# Patient Record
Sex: Female | Born: 1965 | Race: White | Hispanic: No | Marital: Married | Smoking: Former smoker
Health system: Southern US, Community
[De-identification: ages and names within clinical notes are randomized; demographics above are authoritative.]

## PROBLEM LIST (undated history)

## (undated) DIAGNOSIS — F419 Anxiety disorder, unspecified: Secondary | ICD-10-CM

## (undated) DIAGNOSIS — G43909 Migraine, unspecified, not intractable, without status migrainosus: Secondary | ICD-10-CM

## (undated) DIAGNOSIS — C801 Malignant (primary) neoplasm, unspecified: Secondary | ICD-10-CM

## (undated) HISTORY — DX: Anxiety disorder, unspecified: F41.9

## (undated) HISTORY — PX: TONSILLECTOMY: SUR1361

## (undated) HISTORY — PX: ADENOIDECTOMY: SHX5191

## (undated) HISTORY — DX: Malignant (primary) neoplasm, unspecified: C80.1

---

## 2018-01-04 DIAGNOSIS — G43909 Migraine, unspecified, not intractable, without status migrainosus: Secondary | ICD-10-CM | POA: Insufficient documentation

## 2018-02-09 DIAGNOSIS — C801 Malignant (primary) neoplasm, unspecified: Secondary | ICD-10-CM

## 2018-02-09 HISTORY — DX: Malignant (primary) neoplasm, unspecified: C80.1

## 2018-02-10 DIAGNOSIS — C801 Malignant (primary) neoplasm, unspecified: Secondary | ICD-10-CM

## 2018-02-10 HISTORY — DX: Malignant (primary) neoplasm, unspecified: C80.1

## 2018-02-20 DIAGNOSIS — Z720 Tobacco use: Secondary | ICD-10-CM | POA: Insufficient documentation

## 2018-02-20 DIAGNOSIS — F418 Other specified anxiety disorders: Secondary | ICD-10-CM | POA: Diagnosis present

## 2018-02-20 DIAGNOSIS — C20 Malignant neoplasm of rectum: Secondary | ICD-10-CM | POA: Insufficient documentation

## 2018-03-06 DIAGNOSIS — D638 Anemia in other chronic diseases classified elsewhere: Secondary | ICD-10-CM | POA: Insufficient documentation

## 2018-03-06 DIAGNOSIS — K529 Noninfective gastroenteritis and colitis, unspecified: Secondary | ICD-10-CM | POA: Insufficient documentation

## 2018-03-06 DIAGNOSIS — C7800 Secondary malignant neoplasm of unspecified lung: Secondary | ICD-10-CM | POA: Insufficient documentation

## 2018-03-06 DIAGNOSIS — D72829 Elevated white blood cell count, unspecified: Secondary | ICD-10-CM | POA: Insufficient documentation

## 2018-03-15 DIAGNOSIS — D563 Thalassemia minor: Secondary | ICD-10-CM | POA: Insufficient documentation

## 2018-04-25 ENCOUNTER — Other Ambulatory Visit: Payer: Self-pay | Admitting: Oncology

## 2018-04-25 ENCOUNTER — Ambulatory Visit
Admission: RE | Admit: 2018-04-25 | Discharge: 2018-04-25 | Disposition: A | Discharge status: Home / Self Care | Payer: Self-pay | Source: Ambulatory Visit | Attending: Oncology | Admitting: Oncology

## 2018-04-25 ENCOUNTER — Inpatient Hospital Stay
Admission: RE | Admit: 2018-04-25 | Discharge: 2018-04-25 | Disposition: A | Discharge status: Home / Self Care | Payer: Self-pay | Source: Ambulatory Visit | Attending: Oncology | Admitting: Oncology

## 2018-04-25 DIAGNOSIS — C801 Malignant (primary) neoplasm, unspecified: Secondary | ICD-10-CM

## 2018-04-25 NOTE — Progress Notes (Signed)
Called and spoke with Levada Dy RN at Dr. Milus Glazier office at [facility] to request documentation on patient's chemo treatment plan. Patient is transferring her care after having started chemo treatments at [facility].  They will call patient to obtain release of information and then fax documentation.

## 2018-04-27 ENCOUNTER — Telehealth: Payer: Self-pay | Admitting: Oncology

## 2018-04-27 ENCOUNTER — Encounter: Payer: Self-pay | Admitting: Oncology

## 2018-04-27 ENCOUNTER — Inpatient Hospital Stay: Payer: 59 | Attending: Oncology | Admitting: Oncology

## 2018-04-27 VITALS — BP 95/69 | HR 82 | Temp 98.5°F | Resp 18 | Ht 67.0 in | Wt 109.6 lb

## 2018-04-27 DIAGNOSIS — C20 Malignant neoplasm of rectum: Secondary | ICD-10-CM | POA: Diagnosis not present

## 2018-04-27 DIAGNOSIS — Z5111 Encounter for antineoplastic chemotherapy: Secondary | ICD-10-CM | POA: Diagnosis not present

## 2018-04-27 NOTE — Progress Notes (Signed)
Met with Tina Morales and Tina Morales. Patient is transferring care from [facility]. Explained role of nurse navigator.   Referral made to dietician for diet education and to social work.Tina Morales names and phone numbers were provided for entire [facility] team.  Teach back method was used.  Patient and Tina Morales given a tour of the infusion room.No barriers to care identified at present time.  Will continue to follow as needed

## 2018-04-27 NOTE — Progress Notes (Signed)
START OFF PATHWAY REGIMEN - Colorectal   [id number]:FOLFOXIRI q14 days **2 cycles per order sheet**:   A cycle is every 14 days:     Irinotecan      Oxaliplatin      Leucovorin      5-Fluorouracil   **Always confirm dose/schedule in your pharmacy ordering system**  Patient Characteristics: Preoperative or Nonsurgical Candidate (Clinical Staging), Rectal, cT3 - cT4, cN0 or Any cT, cN+ Therapeutic Status: Preoperative or Nonsurgical Candidate (Clinical Staging) Tumor Location: Rectal AJCC T Category: Staged < 8th Ed. AJCC N Category: Staged < 8th Ed. AJCC M Category: Staged < 8th Ed. AJCC 8 Stage Grouping: Staged < 8th Ed. Intent of Therapy: Curative Intent, Discussed with Patient

## 2018-04-27 NOTE — Progress Notes (Signed)
[place] Patient Consult   Requesting MD: Numa Heatwole 52 y.o.  02-12-1966    Reason for Consult: Rectal cancer   HPI: Tina Morales reports irregular bowel habits with intermittent diarrhea for several years.  The diarrhea progressed earlier this year and became associated with pain at the rectum and left buttock. She scheduled a colonoscopy with Dr. Shary Key on 02/10/2018.  A 3.5 cm mass with stigmata of bleeding was noted in the rectum.  The remaining colon appeared normal.  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  There was no loss of mismatch repair protein expression.  CTs on 02/10/2018 revealed eccentric anterior rectal wall thickening, several pulmonary nodules measuring up to 9 mm concerning for metastatic disease.  Changes of emphysema were noted.  Round perirectal lymph nodes measuring up to 6 mm are noted. A pelvic MRI 06/2018 revealed a mass at 2.3 cm from the anal verge measuring 5.4 cm in length.  The tumor was staged as a T4b lesion based on extension to the posterior aspect of the vagina.  2 lymph nodes in the mesorectal fat measured up to 5 mm with additional smaller nodes.  The tumor was staged as an N1 lesion.  A staging PET scan 02/23/2018 healed a hypermetabolic 8 mm lingular nodule.  Smaller nodules were noted in both lungs, a few with mildly increased activity.  Hypermetabolic rectal mass. She was referred to Dr. Marylou Mccoy in medical oncology.  She began treatment with FOLFOX [place] on 03/01/2018.  She has completed 5 cycles to date with the most recent cycle initiated on 04/26/2018.  She was admitted following cycle 1 with a possible bowel obstruction.  She reports symptoms improved after treatment with laxatives.  Her bowels are now functioning regularly while taking MiraLAX and fiber.  She is scheduled for a restaging CT evaluation 05/01/2018.  [name] has decided to transfer care of rectal cancer to [facility].  Medical history: 1.  G0, P0 2.  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  A paternal uncle had lung cancer and was a smoker.  A paternal cousin had CML.  Social History: She lives with her husband and [place].  She previously worked in the [employer].  She quit smoking cigarettes in August 2019 after smoking for 30 years.  No alcohol use.  No transfusion history.  No risk factor for HIV or hepatitis.    ROS:   Positives include: Intermittent low-grade fever, rectal pain-improved following the first treatment with FOLFOX [place], few episodes of rectal bleeding, stinging discomfort in the left axilla after cycle 1 chemotherapy, insomnia, mouth sores following chemotherapy, cold sensitivity following chemotherapy, tingling in the fingers following chemotherapy-not persistent  A complete ROS was otherwise negative.  Physical Exam:  Blood pressure 95/69, pulse 82, temperature 98.5 F (36.9 C), temperature source Oral, resp. rate 18, height 5' 7"  (1.702 m), weight 109 lb 9.6 oz (49.7 kg), SpO2 100 %.  HEENT: Oropharynx without visible mass, neck without mass, no thrush or ulcers Lungs: Clear bilaterally, no respiratory distress Cardiac: Regular rate and rhythm Abdomen: No hepatosplenomegaly, no mass, nontender  Vascular: No leg edema Lymph nodes: No cervical, supraclavicular, axillary, or inguinal nodes Neurologic:  Alert and oriented, the motor exam appears intact in the upper and lower extremities bilaterally Skin: No rash, palms without erythema Musculoskeletal: No spine tenderness Breast: Bilateral breast without mass   LAB: 04/26/2018- ANC 1.69, hematoma 9.6, platelets 237,000  CEA on 02/14/2018:  39.7  Images: I reviewed the PET from 02/23/2018 and CTs from 02/10/2018 with Tina Morales and her husband Assessment/Plan:   1. Rectal cancer-rectal mass noted on digital exam 02/10/2018, colonoscopy confirmed a him my circumferential mass in the rectum  Biopsy 02/10/2018- tubular adenoma with at least high-grade dysplasia but no definitive evidence of invasion, pathology review at digestive health specialist- intramucosal adenocarcinoma (at least), arising in high-grade dysplasia, no loss of mismatch repair protein expression  CTs 02/10/2018- anterior rectal wall thickening, pulmonary nodules measuring up to 9 mm concerning for metastases, few round perirectal lymph nodes  Pelvic MRI 9/64/3838- hypermetabolic low rectal mass extending to the posterior vagina with 2 small enlarged perirectal lymph nodes, T4b,N1- 2.3 cm from the anal verge  PET scan 02/23/2018-hypermetabolic rectal mass, 8 mm hypermetabolic lingular nodule, scattered small bilateral lung nodules, some calcified, a few with mildly increased activity  Cycle 1 FOLFOXIRI 03/01/2018  Cycle 5 FOLFOXIRI 04/26/2018  2.   History of diarrhea and rectal pain secondary to #1 3.   History of tobacco use   Disposition:   Tina Morales has been diagnosed with rectal cancer.  She has a locally advanced rectal tumor and appears to have metastatic disease involving the lungs.  She has completed 5 cycles of treatment with FOLFOXIRI and is scheduled to undergo a restaging CT evaluation next week.  She appears to be tolerating the chemotherapy well.  Her symptoms have improved and the CEA is lower.  I discussed the prognosis of rectal cancer and treatment options with Tina Morales and her husband.  She understands that in general patients with stage IV disease cannot undergo curative therapy.  However if she has a limited number of metastatic lung nodule she may be a candidate for resection or SBRT therapy.  She will bring [place] a copy of the CTs from next week.  I  will present her case at the GI tumor conference on 05/03/2018.  We will make a referral to a coloanal surgeon.  She will be scheduled for an office visit and another cycle of chemotherapy on 05/11/2018.  We will consider treatment options including continuing systemic therapy, capecitabine/radiation, and surgery based on the staging CTs.  A chemotherapy plan was entered today.  Betsy Coder, MD  04/27/2018, 2:09 PM

## 2018-04-27 NOTE — Telephone Encounter (Signed)
Scheduled appt per 10/17 los - gave patient calender and AVS per los

## 2018-05-02 NOTE — Progress Notes (Signed)
CD/ Inage from [facility] received and taken to Margit Hanks in Radiology to be uploaded into PACS.

## 2018-05-03 ENCOUNTER — Inpatient Hospital Stay
Admission: RE | Admit: 2018-05-03 | Discharge: 2018-05-03 | Disposition: A | Discharge status: Home / Self Care | Payer: Self-pay | Source: Ambulatory Visit | Attending: Oncology | Admitting: Oncology

## 2018-05-03 ENCOUNTER — Other Ambulatory Visit: Payer: Self-pay | Admitting: Oncology

## 2018-05-03 DIAGNOSIS — C801 Malignant (primary) neoplasm, unspecified: Secondary | ICD-10-CM

## 2018-05-06 ENCOUNTER — Other Ambulatory Visit: Payer: Self-pay | Admitting: Oncology

## 2018-05-07 ENCOUNTER — Other Ambulatory Visit: Payer: Self-pay | Admitting: Oncology

## 2018-05-11 ENCOUNTER — Encounter: Payer: Self-pay | Admitting: Oncology

## 2018-05-11 ENCOUNTER — Inpatient Hospital Stay: Payer: 59

## 2018-05-11 ENCOUNTER — Inpatient Hospital Stay (HOSPITAL_BASED_OUTPATIENT_CLINIC_OR_DEPARTMENT_OTHER): Payer: 59 | Admitting: Nurse Practitioner

## 2018-05-11 ENCOUNTER — Inpatient Hospital Stay: Payer: 59 | Admitting: Nutrition

## 2018-05-11 ENCOUNTER — Encounter: Payer: Self-pay | Admitting: Nurse Practitioner

## 2018-05-11 VITALS — BP 110/84 | HR 67 | Temp 98.7°F | Resp 17 | Ht 67.0 in | Wt 112.0 lb

## 2018-05-11 DIAGNOSIS — Z9221 Personal history of antineoplastic chemotherapy: Secondary | ICD-10-CM | POA: Diagnosis not present

## 2018-05-11 DIAGNOSIS — C20 Malignant neoplasm of rectum: Secondary | ICD-10-CM | POA: Diagnosis not present

## 2018-05-11 DIAGNOSIS — Z87891 Personal history of nicotine dependence: Secondary | ICD-10-CM | POA: Diagnosis not present

## 2018-05-11 DIAGNOSIS — Z5111 Encounter for antineoplastic chemotherapy: Secondary | ICD-10-CM | POA: Diagnosis not present

## 2018-05-11 DIAGNOSIS — R918 Other nonspecific abnormal finding of lung field: Secondary | ICD-10-CM

## 2018-05-11 LAB — CMP (CANCER CENTER ONLY)
ALBUMIN: 3.3 g/dL — AB (ref 3.5–5.0)
ALK PHOS: 102 U/L (ref 38–126)
ALT: 29 U/L (ref 0–44)
ANION GAP: 9 (ref 5–15)
AST: 42 U/L — ABNORMAL HIGH (ref 15–41)
BUN: 9 mg/dL (ref 6–20)
CALCIUM: 9.3 mg/dL (ref 8.9–10.3)
CO2: 27 mmol/L (ref 22–32)
Chloride: 102 mmol/L (ref 98–111)
Creatinine: 0.57 mg/dL (ref 0.44–1.00)
GFR, Est AFR Am: >60 mL/min (ref >60)
GFR, Estimated: >60 mL/min (ref >60)
GLUCOSE: 97 mg/dL (ref 70–99)
POTASSIUM: 4.1 mmol/L (ref 3.5–5.1)
Sodium: 138 mmol/L (ref 135–145)
Total Bilirubin: 0.5 mg/dL (ref 0.3–1.2)
Total Protein: 6.3 g/dL — ABNORMAL LOW (ref 6.5–8.1)

## 2018-05-11 LAB — CBC WITH DIFFERENTIAL (CANCER CENTER ONLY)
Abs Immature Granulocytes: 0.05 10*3/uL (ref 0.00–0.07)
Basophils Absolute: 0 10*3/uL (ref 0.0–0.1)
Basophils Relative: 1 %
Eosinophils Absolute: 0.1 10*3/uL (ref 0.0–0.5)
Eosinophils Relative: 3 %
HEMATOCRIT: 29.2 % — AB (ref 36.0–46.0)
Hemoglobin: 9.2 g/dL — ABNORMAL LOW (ref 12.0–15.0)
IMMATURE GRANULOCYTES: 1 %
LYMPHS ABS: 1.8 10*3/uL (ref 0.7–4.0)
Lymphocytes Relative: 38 %
MCH: 21.7 pg — ABNORMAL LOW (ref 26.0–34.0)
MCHC: 31.5 g/dL (ref 30.0–36.0)
MCV: 68.9 fL — AB (ref 80.0–100.0)
MONO ABS: 0.9 10*3/uL (ref 0.1–1.0)
MONOS PCT: 19 %
NEUTROS ABS: 1.8 10*3/uL (ref 1.7–7.7)
NEUTROS PCT: 38 %
Platelet Count: 225 10*3/uL (ref 150–400)
RBC: 4.24 MIL/uL (ref 3.87–5.11)
RDW: 19.9 % — ABNORMAL HIGH (ref 11.5–15.5)
WBC Count: 4.6 10*3/uL (ref 4.0–10.5)
nRBC: 0 % (ref 0.0–0.2)

## 2018-05-11 LAB — CEA (IN HOUSE-CHCC): CEA (CHCC-In House): 7.92 ng/mL — ABNORMAL HIGH (ref 0.00–5.00)

## 2018-05-11 MED ORDER — ALPRAZOLAM 0.5 MG PO TABS: 0.5000 mg | ORAL_TABLET | Freq: Two times a day (BID) | ORAL | 0 refills | Status: DC | PRN

## 2018-05-11 MED ORDER — ATROPINE SULFATE 1 MG/ML IJ SOLN
INTRAMUSCULAR | Status: AC
  Filled 2018-05-11: qty 1

## 2018-05-11 MED ORDER — OXALIPLATIN CHEMO INJECTION 100 MG/20ML
85.0000 mg/m2 | Freq: Once | INTRAVENOUS | Status: AC
  Administered 2018-05-11: 130 mg via INTRAVENOUS
  Filled 2018-05-11: qty 6

## 2018-05-11 MED ORDER — PALONOSETRON HCL INJECTION 0.25 MG/5ML
0.2500 mg | Freq: Once | INTRAVENOUS | Status: AC
  Administered 2018-05-11: 0.25 mg via INTRAVENOUS

## 2018-05-11 MED ORDER — ATROPINE SULFATE 1 MG/ML IJ SOLN
0.5000 mg | Freq: Once | INTRAMUSCULAR | Status: AC | PRN
  Administered 2018-05-11: 0.5 mg via INTRAVENOUS

## 2018-05-11 MED ORDER — PALONOSETRON HCL INJECTION 0.25 MG/5ML
INTRAVENOUS | Status: AC
  Filled 2018-05-11: qty 5

## 2018-05-11 MED ORDER — IRINOTECAN HCL CHEMO INJECTION 100 MG/5ML
150.0000 mg/m2 | Freq: Once | INTRAVENOUS | Status: AC
  Administered 2018-05-11: 220 mg via INTRAVENOUS
  Filled 2018-05-11: qty 11

## 2018-05-11 MED ORDER — DEXTROSE 5 % IV SOLN
Freq: Once | INTRAVENOUS | Status: AC
  Administered 2018-05-11: 10:00:00 via INTRAVENOUS
  Filled 2018-05-11: qty 250

## 2018-05-11 MED ORDER — LEUCOVORIN CALCIUM INJECTION 350 MG
200.0000 mg/m2 | Freq: Once | INTRAVENOUS | Status: AC
  Administered 2018-05-11: 306 mg via INTRAVENOUS
  Filled 2018-05-11: qty 15.3

## 2018-05-11 MED ORDER — SODIUM CHLORIDE 0.9% FLUSH
10.0000 mL | INTRAVENOUS | Status: DC | PRN
  Administered 2018-05-11: 10 mL
  Filled 2018-05-11: qty 10

## 2018-05-11 MED ORDER — SODIUM CHLORIDE 0.9 % IV SOLN
3260.0000 mg/m2 | INTRAVENOUS | Status: DC
  Administered 2018-05-11: 5000 mg via INTRAVENOUS
  Filled 2018-05-11: qty 100

## 2018-05-11 MED ORDER — SODIUM CHLORIDE 0.9 % IV SOLN
Freq: Once | INTRAVENOUS | Status: AC
  Administered 2018-05-11: 11:00:00 via INTRAVENOUS
  Filled 2018-05-11: qty 5

## 2018-05-11 MED ORDER — DEXTROSE 5 % IV SOLN
Freq: Once | INTRAVENOUS | Status: AC
  Administered 2018-05-11: 12:00:00 via INTRAVENOUS
  Filled 2018-05-11: qty 250

## 2018-05-11 NOTE — Progress Notes (Signed)
52 year old female diagnosed with rectal cancer stage IV in August 2019.  She is a patient of Dr. Benay Spice.  Past medical history includes tobacco.  Medications include Xanax, Decadron, multivitamin, Prilosec, and MiraLAX.  Labs were reviewed.  Height: 5 feet 7 inches. Weight: 112 pounds. Usual body weight: 125 pounds per patient. BMI: 17.54  Patient is receiving her 6th cycle of FOLFOXIRI.  She reports she will soon have radiation therapy and begin taking Xeloda. Patient denies nausea, vomiting, diarrhea, constipation. She is drinking protein shakes 2 times a day. Patient denies needs and reports no questions today.  Nutrition diagnosis:  Underweight related to rectal cancer and associated treatments as evidenced by BMI of 17.54.  Intervention: I educated patient to consume high-calorie, high-protein foods and small frequent meals and snacks. Recommended patient continue oral nutrition supplements using a high-calorie, high-protein formula. Provided coupons. Questions were answered.  Teach back method used.  Contact information provided.  Fact sheets were given.  Monitoring, evaluation, goals: Patient will tolerate increased calories and protein to minimize weight loss.  Next visit: She has my contact information if she develops questions or concerns.  **Disclaimer: This note was dictated with voice recognition software. Similar sounding words can inadvertently be transcribed and this note may contain transcription errors which may not have been corrected upon publication of note.**

## 2018-05-11 NOTE — Patient Instructions (Addendum)
[facility] Discharge Instructions for Patients Receiving Chemotherapy  Today you received the following chemotherapy agents irinotecan (Camptosar), oxaliplatin (Eloxatin), Leucovorin, fluorouracil (Adrucil/5FU).  To help prevent nausea and vomiting after your treatment, we encourage you to take your nausea medication.    If you develop nausea and vomiting that is not controlled by your nausea medication, call the clinic.   BELOW ARE SYMPTOMS THAT SHOULD BE REPORTED IMMEDIATELY:  *FEVER GREATER THAN 100.5 F  *CHILLS WITH OR WITHOUT FEVER  NAUSEA AND VOMITING THAT IS NOT CONTROLLED WITH YOUR NAUSEA MEDICATION  *UNUSUAL SHORTNESS OF BREATH  *UNUSUAL BRUISING OR BLEEDING  TENDERNESS IN MOUTH AND THROAT WITH OR WITHOUT PRESENCE OF ULCERS  *URINARY PROBLEMS  *BOWEL PROBLEMS  UNUSUAL RASH Items with * indicate a potential emergency and should be followed up as soon as possible.  Feel free to call the clinic should you have any questions or concerns. The clinic phone number is [phone].  Please show the [facility] at check-in to the Emergency Department and triage nurse.  Irinotecan injection What is this medicine? IRINOTECAN (ir in oh TEE kan ) is a chemotherapy drug. It is used to treat colon and rectal cancer. This medicine may be used for other purposes; ask your health care provider or pharmacist if you have questions. COMMON BRAND NAME(S): Camptosar What should I tell my health care provider before I take this medicine? They need to know if you have any of these conditions: -blood disorders -dehydration -diarrhea -infection (especially a virus infection such as chickenpox, cold sores, or herpes) -liver disease -low blood counts, like low white cell, platelet, or red cell counts -recent or ongoing radiation therapy -an unusual or allergic reaction to irinotecan, sorbitol, other chemotherapy, other medicines, foods, dyes, or  preservatives -pregnant or trying to get pregnant -breast-feeding How should I use this medicine? This drug is given as an infusion into a vein. It is administered in a hospital or clinic by a specially trained health care professional. Talk to your pediatrician regarding the use of this medicine in children. Special care may be needed. Overdosage: If you think you have taken too much of this medicine contact a poison control center or emergency room at once. NOTE: This medicine is only for you. Do not share this medicine with others. What if I miss a dose? It is important not to miss your dose. Call your doctor or health care professional if you are unable to keep an appointment. What may interact with this medicine? Do not take this medicine with any of the following medications: -atazanavir -certain medicines for fungal infections like itraconazole and ketoconazole -St. John's Wort This medicine may also interact with the following medications: -dexamethasone -diuretics -laxatives -medicines for seizures like carbamazepine, mephobarbital, phenobarbital, phenytoin, primidone -medicines to increase blood counts like filgrastim, pegfilgrastim, sargramostim -prochlorperazine -vaccines This list may not describe all possible interactions. Give your health care provider a list of all the medicines, herbs, non-prescription drugs, or dietary supplements you use. Also tell them if you smoke, drink alcohol, or use illegal drugs. Some items may interact with your medicine. What should I watch for while using this medicine? Your condition will be monitored carefully while you are receiving this medicine. You will need important blood work done while you are taking this medicine. This drug may make you feel generally unwell. This is not uncommon, as chemotherapy can affect healthy cells as well as cancer cells. Report any side effects. Continue your course of  treatment even though you feel ill unless  your doctor tells you to stop. In some cases, you may be given additional medicines to help with side effects. Follow all directions for their use. You may get drowsy or dizzy. Do not drive, use machinery, or do anything that needs mental alertness until you know how this medicine affects you. Do not stand or sit up quickly, especially if you are an older patient. This reduces the risk of dizzy or fainting spells. Call your doctor or health care professional for advice if you get a fever, chills or sore throat, or other symptoms of a cold or flu. Do not treat yourself. This drug decreases your body's ability to fight infections. Try to avoid being around people who are sick. This medicine may increase your risk to bruise or bleed. Call your doctor or health care professional if you notice any unusual bleeding. Be careful brushing and flossing your teeth or using a toothpick because you may get an infection or bleed more easily. If you have any dental work done, tell your dentist you are receiving this medicine. Avoid taking products that contain aspirin, acetaminophen, ibuprofen, naproxen, or ketoprofen unless instructed by your doctor. These medicines may hide a fever. Do not become pregnant while taking this medicine. Women should inform their doctor if they wish to become pregnant or think they might be pregnant. There is a potential for serious side effects to an unborn child. Talk to your health care professional or pharmacist for more information. Do not breast-feed an infant while taking this medicine. What side effects may I notice from receiving this medicine? Side effects that you should report to your doctor or health care professional as soon as possible: -allergic reactions like skin rash, itching or hives, swelling of the face, lips, or tongue -low blood counts - this medicine may decrease the number of white blood cells, red blood cells and platelets. You may be at increased risk for  infections and bleeding. -signs of infection - fever or chills, cough, sore throat, pain or difficulty passing urine -signs of decreased platelets or bleeding - bruising, pinpoint red spots on the skin, black, tarry stools, blood in the urine -signs of decreased red blood cells - unusually weak or tired, fainting spells, lightheadedness -breathing problems -chest pain -diarrhea -feeling faint or lightheaded, falls -flushing, runny nose, sweating during infusion -mouth sores or pain -pain, swelling, redness or irritation where injected -pain, swelling, warmth in the leg -pain, tingling, numbness in the hands or feet -problems with balance, talking, walking -stomach cramps, pain -trouble passing urine or change in the amount of urine -vomiting as to be unable to hold down drinks or food -yellowing of the eyes or skin Side effects that usually do not require medical attention (report to your doctor or health care professional if they continue or are bothersome): -constipation -hair loss -headache -loss of appetite -nausea, vomiting -stomach upset This list may not describe all possible side effects. Call your doctor for medical advice about side effects. You may report side effects to FDA at 1-800-FDA-1088. Where should I keep my medicine? This drug is given in a hospital or clinic and will not be stored at home. NOTE: This sheet is a summary. It may not cover all possible information. If you have questions about this medicine, talk to your doctor, pharmacist, or health care provider.  2018 Elsevier/Gold Standard (2012-12-25 16:29:32)  Oxaliplatin Injection What is this medicine? OXALIPLATIN (ox AL i PLA tin) is a  chemotherapy drug. It targets fast dividing cells, like cancer cells, and causes these cells to die. This medicine is used to treat cancers of the colon and rectum, and many other cancers. This medicine may be used for other purposes; ask your health care provider or  pharmacist if you have questions. COMMON BRAND NAME(S): Eloxatin What should I tell my health care provider before I take this medicine? They need to know if you have any of these conditions: -kidney disease -an unusual or allergic reaction to oxaliplatin, other chemotherapy, other medicines, foods, dyes, or preservatives -pregnant or trying to get pregnant -breast-feeding How should I use this medicine? This drug is given as an infusion into a vein. It is administered in a hospital or clinic by a specially trained health care professional. Talk to your pediatrician regarding the use of this medicine in children. Special care may be needed. Overdosage: If you think you have taken too much of this medicine contact a poison control center or emergency room at once. NOTE: This medicine is only for you. Do not share this medicine with others. What if I miss a dose? It is important not to miss a dose. Call your doctor or health care professional if you are unable to keep an appointment. What may interact with this medicine? -medicines to increase blood counts like filgrastim, pegfilgrastim, sargramostim -probenecid -some antibiotics like amikacin, gentamicin, neomycin, polymyxin B, streptomycin, tobramycin -zalcitabine Talk to your doctor or health care professional before taking any of these medicines: -acetaminophen -aspirin -ibuprofen -ketoprofen -naproxen This list may not describe all possible interactions. Give your health care provider a list of all the medicines, herbs, non-prescription drugs, or dietary supplements you use. Also tell them if you smoke, drink alcohol, or use illegal drugs. Some items may interact with your medicine. What should I watch for while using this medicine? Your condition will be monitored carefully while you are receiving this medicine. You will need important blood work done while you are taking this medicine. This medicine can make you more sensitive to  cold. Do not drink cold drinks or use ice. Cover exposed skin before coming in contact with cold temperatures or cold objects. When out in cold weather wear warm clothing and cover your mouth and nose to warm the air that goes into your lungs. Tell your doctor if you get sensitive to the cold. This drug may make you feel generally unwell. This is not uncommon, as chemotherapy can affect healthy cells as well as cancer cells. Report any side effects. Continue your course of treatment even though you feel ill unless your doctor tells you to stop. In some cases, you may be given additional medicines to help with side effects. Follow all directions for their use. Call your doctor or health care professional for advice if you get a fever, chills or sore throat, or other symptoms of a cold or flu. Do not treat yourself. This drug decreases your body's ability to fight infections. Try to avoid being around people who are sick. This medicine may increase your risk to bruise or bleed. Call your doctor or health care professional if you notice any unusual bleeding. Be careful brushing and flossing your teeth or using a toothpick because you may get an infection or bleed more easily. If you have any dental work done, tell your dentist you are receiving this medicine. Avoid taking products that contain aspirin, acetaminophen, ibuprofen, naproxen, or ketoprofen unless instructed by your doctor. These medicines may hide a fever.  Do not become pregnant while taking this medicine. Women should inform their doctor if they wish to become pregnant or think they might be pregnant. There is a potential for serious side effects to an unborn child. Talk to your health care professional or pharmacist for more information. Do not breast-feed an infant while taking this medicine. Call your doctor or health care professional if you get diarrhea. Do not treat yourself. What side effects may I notice from receiving this medicine? Side  effects that you should report to your doctor or health care professional as soon as possible: -allergic reactions like skin rash, itching or hives, swelling of the face, lips, or tongue -low blood counts - This drug may decrease the number of white blood cells, red blood cells and platelets. You may be at increased risk for infections and bleeding. -signs of infection - fever or chills, cough, sore throat, pain or difficulty passing urine -signs of decreased platelets or bleeding - bruising, pinpoint red spots on the skin, black, tarry stools, nosebleeds -signs of decreased red blood cells - unusually weak or tired, fainting spells, lightheadedness -breathing problems -chest pain, pressure -cough -diarrhea -jaw tightness -mouth sores -nausea and vomiting -pain, swelling, redness or irritation at the injection site -pain, tingling, numbness in the hands or feet -problems with balance, talking, walking -redness, blistering, peeling or loosening of the skin, including inside the mouth -trouble passing urine or change in the amount of urine Side effects that usually do not require medical attention (report to your doctor or health care professional if they continue or are bothersome): -changes in vision -constipation -hair loss -loss of appetite -metallic taste in the mouth or changes in taste -stomach pain This list may not describe all possible side effects. Call your doctor for medical advice about side effects. You may report side effects to FDA at 1-800-FDA-1088. Where should I keep my medicine? This drug is given in a hospital or clinic and will not be stored at home. NOTE: This sheet is a summary. It may not cover all possible information. If you have questions about this medicine, talk to your doctor, pharmacist, or health care provider.  2018 Elsevier/Gold Standard (2008-01-23 17:22:47)  Leucovorin injection What is this medicine? LEUCOVORIN (loo koe VOR in) is used to prevent  or treat the harmful effects of some medicines. This medicine is used to treat anemia caused by a low amount of folic acid in the body. It is also used with 5-fluorouracil (5-FU) to treat colon cancer. This medicine may be used for other purposes; ask your health care provider or pharmacist if you have questions. What should I tell my health care provider before I take this medicine? They need to know if you have any of these conditions: -anemia from low levels of vitamin B-12 in the blood -an unusual or allergic reaction to leucovorin, folic acid, other medicines, foods, dyes, or preservatives -pregnant or trying to get pregnant -breast-feeding How should I use this medicine? This medicine is for injection into a muscle or into a vein. It is given by a health care professional in a hospital or clinic setting. Talk to your pediatrician regarding the use of this medicine in children. Special care may be needed. Overdosage: If you think you have taken too much of this medicine contact a poison control center or emergency room at once. NOTE: This medicine is only for you. Do not share this medicine with others. What if I miss a dose? This does not apply.  What may interact with this medicine? -capecitabine -fluorouracil -phenobarbital -phenytoin -primidone -trimethoprim-sulfamethoxazole This list may not describe all possible interactions. Give your health care provider a list of all the medicines, herbs, non-prescription drugs, or dietary supplements you use. Also tell them if you smoke, drink alcohol, or use illegal drugs. Some items may interact with your medicine. What should I watch for while using this medicine? Your condition will be monitored carefully while you are receiving this medicine. This medicine may increase the side effects of 5-fluorouracil, 5-FU. Tell your doctor or health care professional if you have diarrhea or mouth sores that do not get better or that get worse. What  side effects may I notice from receiving this medicine? Side effects that you should report to your doctor or health care professional as soon as possible: -allergic reactions like skin rash, itching or hives, swelling of the face, lips, or tongue -breathing problems -fever, infection -mouth sores -unusual bleeding or bruising -unusually weak or tired Side effects that usually do not require medical attention (report to your doctor or health care professional if they continue or are bothersome): -constipation or diarrhea -loss of appetite -nausea, vomiting This list may not describe all possible side effects. Call your doctor for medical advice about side effects. You may report side effects to FDA at 1-800-FDA-1088. Where should I keep my medicine? This drug is given in a hospital or clinic and will not be stored at home. NOTE: This sheet is a summary. It may not cover all possible information. If you have questions about this medicine, talk to your doctor, pharmacist, or health care provider.  2018 Elsevier/Gold Standard (2008-01-02 16:50:29)  Fluorouracil, 5-FU injection What is this medicine? FLUOROURACIL, 5-FU (flure oh YOOR a sil) is a chemotherapy drug. It slows the growth of cancer cells. This medicine is used to treat many types of cancer like breast cancer, colon or rectal cancer, pancreatic cancer, and stomach cancer. This medicine may be used for other purposes; ask your health care provider or pharmacist if you have questions. COMMON BRAND NAME(S): Adrucil What should I tell my health care provider before I take this medicine? They need to know if you have any of these conditions: -blood disorders -dihydropyrimidine dehydrogenase (DPD) deficiency -infection (especially a virus infection such as chickenpox, cold sores, or herpes) -kidney disease -liver disease -malnourished, poor nutrition -recent or ongoing radiation therapy -an unusual or allergic reaction to  fluorouracil, other chemotherapy, other medicines, foods, dyes, or preservatives -pregnant or trying to get pregnant -breast-feeding How should I use this medicine? This drug is given as an infusion or injection into a vein. It is administered in a hospital or clinic by a specially trained health care professional. Talk to your pediatrician regarding the use of this medicine in children. Special care may be needed. Overdosage: If you think you have taken too much of this medicine contact a poison control center or emergency room at once. NOTE: This medicine is only for you. Do not share this medicine with others. What if I miss a dose? It is important not to miss your dose. Call your doctor or health care professional if you are unable to keep an appointment. What may interact with this medicine? -allopurinol -cimetidine -dapsone -digoxin -hydroxyurea -leucovorin -levamisole -medicines for seizures like ethotoin, fosphenytoin, phenytoin -medicines to increase blood counts like filgrastim, pegfilgrastim, sargramostim -medicines that treat or prevent blood clots like warfarin, enoxaparin, and dalteparin -methotrexate -metronidazole -pyrimethamine -some other chemotherapy drugs like busulfan, cisplatin, estramustine, vinblastine -  trimethoprim -trimetrexate -vaccines Talk to your doctor or health care professional before taking any of these medicines: -acetaminophen -aspirin -ibuprofen -ketoprofen -naproxen This list may not describe all possible interactions. Give your health care provider a list of all the medicines, herbs, non-prescription drugs, or dietary supplements you use. Also tell them if you smoke, drink alcohol, or use illegal drugs. Some items may interact with your medicine. What should I watch for while using this medicine? Visit your doctor for checks on your progress. This drug may make you feel generally unwell. This is not uncommon, as chemotherapy can affect  healthy cells as well as cancer cells. Report any side effects. Continue your course of treatment even though you feel ill unless your doctor tells you to stop. In some cases, you may be given additional medicines to help with side effects. Follow all directions for their use. Call your doctor or health care professional for advice if you get a fever, chills or sore throat, or other symptoms of a cold or flu. Do not treat yourself. This drug decreases your body's ability to fight infections. Try to avoid being around people who are sick. This medicine may increase your risk to bruise or bleed. Call your doctor or health care professional if you notice any unusual bleeding. Be careful brushing and flossing your teeth or using a toothpick because you may get an infection or bleed more easily. If you have any dental work done, tell your dentist you are receiving this medicine. Avoid taking products that contain aspirin, acetaminophen, ibuprofen, naproxen, or ketoprofen unless instructed by your doctor. These medicines may hide a fever. Do not become pregnant while taking this medicine. Women should inform their doctor if they wish to become pregnant or think they might be pregnant. There is a potential for serious side effects to an unborn child. Talk to your health care professional or pharmacist for more information. Do not breast-feed an infant while taking this medicine. Men should inform their doctor if they wish to father a child. This medicine may lower sperm counts. Do not treat diarrhea with over the counter products. Contact your doctor if you have diarrhea that lasts more than 2 days or if it is severe and watery. This medicine can make you more sensitive to the sun. Keep out of the sun. If you cannot avoid being in the sun, wear protective clothing and use sunscreen. Do not use sun lamps or tanning beds/booths. What side effects may I notice from receiving this medicine? Side effects that you  should report to your doctor or health care professional as soon as possible: -allergic reactions like skin rash, itching or hives, swelling of the face, lips, or tongue -low blood counts - this medicine may decrease the number of white blood cells, red blood cells and platelets. You may be at increased risk for infections and bleeding. -signs of infection - fever or chills, cough, sore throat, pain or difficulty passing urine -signs of decreased platelets or bleeding - bruising, pinpoint red spots on the skin, black, tarry stools, blood in the urine -signs of decreased red blood cells - unusually weak or tired, fainting spells, lightheadedness -breathing problems -changes in vision -chest pain -mouth sores -nausea and vomiting -pain, swelling, redness at site where injected -pain, tingling, numbness in the hands or feet -redness, swelling, or sores on hands or feet -stomach pain -unusual bleeding Side effects that usually do not require medical attention (report to your doctor or health care professional if they  continue or are bothersome): -changes in finger or toe nails -diarrhea -dry or itchy skin -hair loss -headache -loss of appetite -sensitivity of eyes to the light -stomach upset -unusually teary eyes This list may not describe all possible side effects. Call your doctor for medical advice about side effects. You may report side effects to FDA at 1-800-FDA-1088. Where should I keep my medicine? This drug is given in a hospital or clinic and will not be stored at home. NOTE: This sheet is a summary. It may not cover all possible information. If you have questions about this medicine, talk to your doctor, pharmacist, or health care provider.  2018 Elsevier/Gold Standard (2007-11-01 13:53:16)

## 2018-05-11 NOTE — Progress Notes (Signed)
Discussed pt tendency to constipation in r/t use of atropine prior to irinotecan. Pt had bad experience first tx, but has since seen GI doctor to control symptom. Pt preference to continue receiving atropine. Constipation has been controlled since first treatment. Today is tx 6 for pt.  Pt to come in at 1:30pm on Saturday for pump d/c. Pump to finish at 2pm. If nurses leaving, pump will be d/c with remaining volume in bag. No bolus per Dr. Benay Spice.  Chat message sent to Jethro Bolus, RN with Dr. Benay Spice requesting desired anti-emetics to be sent to pt's preferred pharmacy in [facility]. Pt encouraged to call back tomorrow if she has not heard anything. Transitioning to [facility] for her sixth chemotherapy tx today.

## 2018-05-11 NOTE — Progress Notes (Addendum)
[place] OFFICE PROGRESS NOTE   Diagnosis: Rectal cancer  INTERVAL HISTORY:   Tina Morales returns as scheduled.  She denies significant nausea/vomiting with previous chemotherapy.  She tends to note mouth sores for a few days.  This does not interfere with her ability to eat.  No diarrhea.  She does note constipation around the time of treatment.  She takes MiraLAX and fiber.  She has persistent mild cold sensitivity.  No neuropathy symptoms in the absence of cold exposure.  Objective:  Vital signs in last 24 hours:  Blood pressure 110/84, pulse 67, temperature 98.7 F (37.1 C), temperature source Oral, resp. rate 17, height '5\' 7"'$  (1.702 m), weight 112 lb (50.8 kg), SpO2 96 %.    HEENT: No thrush or ulcers. Resp: Lungs clear bilaterally. Cardio: Regular rate and rhythm. GI: Abdomen soft and nontender.  No hepatomegaly. Vascular: No leg edema. Neuro: Vibratory sense intact over the fingertips per tuning fork exam. Skin: Palms without erythema. Port-A-Cath without erythema.   Lab Results:  Lab Results  Component Value Date   WBC 4.6 05/11/2018   HGB 9.2 (L) 05/11/2018   HCT 29.2 (L) 05/11/2018   MCV 68.9 (L) 05/11/2018   PLT 225 05/11/2018   NEUTROABS 1.8 05/11/2018    Imaging:  No results found.  Medications: I have reviewed the patient's current medications.  Assessment/Plan: 1. Rectal cancer-rectal mass noted on digital exam 02/10/2018, colonoscopy confirmed a him my circumferential mass in the rectum ? 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 CTs 02/10/2018- anterior rectal wall thickening, pulmonary nodules measuring up to 9 mm concerning for metastases, few round perirectal lymph nodes ? Pelvic MRI 7/41/2878-  hypermetabolic low rectal mass extending to the posterior vagina with 2 small enlarged perirectal lymph nodes, T4b,N1- 2.3 cm from the anal verge ? PET scan 02/23/2018-hypermetabolic rectal mass, 8 mm hypermetabolic lingular nodule, scattered small bilateral lung nodules, some calcified, a few with mildly increased activity ? Cycle 1 FOLFOXIRI 03/01/2018 ? Cycle 5 FOLFOXIRI 04/26/2018 ? CTs 05/01/2018-significant interval response to therapy with decreased size of the primary rectal mass lesion.  Decreasing perirectal lymphadenopathy.  Decreased and/or resolved pulmonary nodules.  No new sites of disease identified. ? Cycle 6 FOLFOXIRI 05/11/2018  2.   History of diarrhea and rectal pain secondary to #1 3.   History of tobacco use  Disposition: Tina Morales appears stable.  She has completed 5 cycles of FOLFOXIRI.  Recent restaging CTs show improvement.  Dr. Benay Spice reviewed the CT results with Tina Morales and her husband at today's visit and recommends proceeding with another cycle of FOLFOXIRI today.  We are making a referral to Dr. Lisbeth Renshaw, radiation oncology to consider radiation to the rectum.  This would be done with concurrent Xeloda chemotherapy.  We are also making a referral to Dr. Annye English.  She will return for lab and follow-up in 2 weeks for additional discussion.  Patient seen with Dr. Benay Spice.  25 minutes were spent face-to-face at today's visit with the majority of that time involved in counseling/coordination of care.    Ned Card ANP/GNP-BC   05/11/2018  9:32 AM  This was a shared visit with Ned Card.  Tina Morales hears well.  She is completed 5 cycles of FOLFOXIRI.  She has tolerated the chemotherapy well.  There has  been clinical and x-ray improvement.  I presented her case at the GI tumor conference yesterday.  We reviewed the waistline and restaging images.  She appears to have a locally advanced rectal cancer that has responded to systemic chemotherapy.  Hypermetabolic  lung nodules have decreased in size.  The consensus recommendation from the multidisciplinary team is to proceed with chemotherapy/radiation and resection of the primary tumor.  We can consider SBRT or resection of the lung nodules.  She will complete another cycle of FOLFOXIRI today.  We made a referral to radiation oncology and surgery.  The plan is to initiate concurrent capecitabine and radiation in approximately 3-4 weeks.  Tina Morales is in agreement with the treatment plan.  Julieanne Manson, MD

## 2018-05-11 NOTE — Progress Notes (Signed)
Went to infusion area to introduce myself as Arboriculturist and to offer available resources.  Discussed one-time $63 [place] and advised proof of household income would be needed to apply. She verbalized understanding.  Discussed the [facility] for patients whom live in [place]. She states she is working with them already.  She has my card for any additional financial questions or concerns.

## 2018-05-12 ENCOUNTER — Other Ambulatory Visit: Payer: Self-pay | Admitting: *Deleted

## 2018-05-12 ENCOUNTER — Telehealth: Payer: Self-pay | Admitting: Nurse Practitioner

## 2018-05-12 DIAGNOSIS — C20 Malignant neoplasm of rectum: Secondary | ICD-10-CM

## 2018-05-12 MED ORDER — ONDANSETRON HCL 8 MG PO TABS: 8.0000 mg | ORAL_TABLET | Freq: Two times a day (BID) | ORAL | 1 refills | Status: DC | PRN

## 2018-05-12 MED ORDER — PROCHLORPERAZINE MALEATE 10 MG PO TABS: 10.0000 mg | ORAL_TABLET | Freq: Four times a day (QID) | ORAL | 1 refills | Status: DC | PRN

## 2018-05-12 NOTE — Telephone Encounter (Signed)
Scheduled appt per 10/31 los- pt is aware of appt date and time

## 2018-05-13 ENCOUNTER — Inpatient Hospital Stay: Payer: 59 | Attending: Oncology

## 2018-05-13 VITALS — BP 109/75 | HR 73 | Temp 98.3°F | Resp 18

## 2018-05-13 DIAGNOSIS — Z5111 Encounter for antineoplastic chemotherapy: Secondary | ICD-10-CM | POA: Diagnosis present

## 2018-05-13 DIAGNOSIS — D649 Anemia, unspecified: Secondary | ICD-10-CM | POA: Insufficient documentation

## 2018-05-13 DIAGNOSIS — C20 Malignant neoplasm of rectum: Secondary | ICD-10-CM | POA: Insufficient documentation

## 2018-05-13 DIAGNOSIS — Z87891 Personal history of nicotine dependence: Secondary | ICD-10-CM | POA: Insufficient documentation

## 2018-05-13 MED ORDER — SODIUM CHLORIDE 0.9% FLUSH
10.0000 mL | INTRAVENOUS | Status: DC | PRN
  Administered 2018-05-13: 10 mL
  Filled 2018-05-13: qty 10

## 2018-05-13 MED ORDER — HEPARIN SOD (PORK) LOCK FLUSH 100 UNIT/ML IV SOLN
500.0000 [IU] | Freq: Once | INTRAVENOUS | Status: AC | PRN
  Administered 2018-05-13: 500 [IU]
  Filled 2018-05-13: qty 5

## 2018-05-13 NOTE — Patient Instructions (Signed)
Implanted Port Home Guide An implanted port is a type of central line that is placed under the skin. Central lines are used to provide IV access when treatment or nutrition needs to be given through a person's veins. Implanted ports are used for long-term IV access. An implanted port may be placed because:  You need IV medicine that would be irritating to the small veins in your hands or arms.  You need long-term IV medicines, such as antibiotics.  You need IV nutrition for a long period.  You need frequent blood draws for lab tests.  You need dialysis.  Implanted ports are usually placed in the chest area, but they can also be placed in the upper arm, the abdomen, or the leg. An implanted port has two main parts:  Reservoir. The reservoir is round and will appear as a small, raised area under your skin. The reservoir is the part where a needle is inserted to give medicines or draw blood.  Catheter. The catheter is a thin, flexible tube that extends from the reservoir. The catheter is placed into a large vein. Medicine that is inserted into the reservoir goes into the catheter and then into the vein.  How will I care for my incision site? Do not get the incision site wet. Bathe or shower as directed by your health care provider. How is my port accessed? Special steps must be taken to access the port:  Before the port is accessed, a numbing cream can be placed on the skin. This helps numb the skin over the port site.  Your health care provider uses a sterile technique to access the port. ? Your health care provider must put on a mask and sterile gloves. ? The skin over your port is cleaned carefully with an antiseptic and allowed to dry. ? The port is gently pinched between sterile gloves, and a needle is inserted into the port.  Only "non-coring" port needles should be used to access the port. Once the port is accessed, a blood return should be checked. This helps ensure that the port  is in the vein and is not clogged.  If your port needs to remain accessed for a constant infusion, a clear (transparent) bandage will be placed over the needle site. The bandage and needle will need to be changed every week, or as directed by your health care provider.  Keep the bandage covering the needle clean and dry. Do not get it wet. Follow your health care provider's instructions on how to take a shower or bath while the port is accessed.  If your port does not need to stay accessed, no bandage is needed over the port.  What is flushing? Flushing helps keep the port from getting clogged. Follow your health care provider's instructions on how and when to flush the port. Ports are usually flushed with saline solution or a medicine called heparin. The need for flushing will depend on how the port is used.  If the port is used for intermittent medicines or blood draws, the port will need to be flushed: ? After medicines have been given. ? After blood has been drawn. ? As part of routine maintenance.  If a constant infusion is running, the port may not need to be flushed.  How long will my port stay implanted? The port can stay in for as long as your health care provider thinks it is needed. When it is time for the port to come out, surgery will be   done to remove it. The procedure is similar to the one performed when the port was put in. When should I seek immediate medical care? When you have an implanted port, you should seek immediate medical care if:  You notice a bad smell coming from the incision site.  You have swelling, redness, or drainage at the incision site.  You have more swelling or pain at the port site or the surrounding area.  You have a fever that is not controlled with medicine.  This information is not intended to replace advice given to you by your health care provider. Make sure you discuss any questions you have with your health care provider. Document  Released: 06/28/2005 Document Revised: 12/04/2015 Document Reviewed: 03/05/2013 Elsevier Interactive Patient Education  2017 Elsevier Inc.  

## 2018-05-16 NOTE — Progress Notes (Signed)
Surgical consult with Dr. Dema Severin scheduled for   05/24/18 @ 1:45.

## 2018-05-17 NOTE — Progress Notes (Signed)
GI Location of Tumor / Histology:Rectal cancer with mets to the lung.  Rectal mass noted on digital exam 02/10/2018, status post 6 cycles Folfoxiri 8/21-10/31/2019.   Tina Morales presented with several years of irregular bowel habits with intermittent diarrhea.  The diarrhea progressed earlier this year and became associated with pain at the rectum and left buttock.  PET 8/33/8250: hypermetabolic 8 mm lingular nodule.  Smaller nodules were noted in both lungs, a few with mildly increased activity.  Hypermetabolic rectal mass.  CT CAP 05/01/2018: findings consistent with significant interval response to therapy with decreased size of the primary rectal mass lesion.  Decreasing perirectal lymphadenopathy which are no longer pathologically enlarged.  Decreased in/or resolved pulmonary nodules.  No new sites of disease identified.  PET 5/39/7673: Hypermetabolic rectal mass.  8 mm hypermetabolic lingular nodule.  Scattered small bilateral lung nodules, some of which are calcified.  A few show mildly increased activity.  MRI pelvis 02/20/2018: A mass 2.3 cm from the anal verge measuring 5.4 cm in length.  The tumor was staged as a T4b lesion based on extension to the posterior aspect of the vagina.  2 lymph nodes in the mesorectal fat measured up to 5 mm with additional smaller nodes.  The tumor was staged as an N1 lesion.  CT 02/10/2018: eccentric anterior rectal wall thickening, several pulmonary nodules measuring up to 9 mm concerning for metastatic disease.  Changes of emphysema were noted, round perirectal lymph nodes measuring up to 6 mm are noted.  Colonoscopy 02/10/2018: 3.5 cm mass with stigmata of bleeding was noted in the rectum.  The remaining colon appeared normal.  Biopsies of rectal mass-done at digestive health specialist: intramucosal adenocarcinoma arising in high grade dysplasia.    Past/Anticipated interventions by surgeon, if any:  Dr. Dema Severin 05/24/2018 1:45  Past/Anticipated  interventions by medical oncology, if any:  Dr. Dewitt Hoes 05/11/2018 -[name] appears stable, she has completed 5 cycles of folfoxiri with Dr. Marylou Mccoy.  Will start 6th cycle today. -We are making a referral to Dr. Lisbeth Renshaw, radiation oncology to consider radiation to the rectum.   -This would be done with concurrent xeloda chemotherapy. -We are also making a referral to Dr. Annye English. -She will return for lab and follow-up in 2 weeks for additional discussion. -I presented her case at GI tumor conference, we reviewed the waistline and restaging images. -She appears to have a locally advanced rectal cancer that has responded to systemic chemotherapy.  Hypermetabolic lung nodules have decreased in size. -The consensus recommendation from the multidisciplinary team is to proceed with chemotherapy/radiation and resection of the primary tumor.  We can consider SBRT or resection of the lung nodules. -The plan is to initiate concurrent capecitabine and radiation in approximately 3-4 weeks. -5FU done Saturday   Weight changes, if any: No, trying to gain some  Bowel/Bladder complaints, if any: doing things to stay regular.  Nausea / Vomiting, if any: No  Pain issues, if any: No   Any blood per rectum: Very rare to see blood in stool.   Signs/Symptoms  Weight changes, if any: No  Respiratory complaints, if any: No  Hemoptysis, if any: No  Pain issues, if any:  None  BP 109/81 (BP Location: Left Arm, Patient Position: Sitting)   Pulse 96   Temp 98.5 F (36.9 C) (Oral)   Resp 18   Ht 5\' 7"  (1.702 m)   Wt 112 lb 2 oz (50.9 kg)   SpO2 100%   BMI 17.56 kg/m  Wt Readings from Last 3 Encounters:  05/18/18 112 lb 2 oz (50.9 kg)  05/11/18 112 lb (50.8 kg)  04/27/18 109 lb 9.6 oz (49.7 kg)   SAFETY ISSUES:  Prior radiation? No  Pacemaker/ICD? No  Possible current pregnancy? No  Is the patient on methotrexate? No  Current Complaints / other details:

## 2018-05-18 ENCOUNTER — Encounter: Payer: Self-pay | Admitting: Radiation Oncology

## 2018-05-18 ENCOUNTER — Ambulatory Visit
Admission: RE | Admit: 2018-05-18 | Discharge: 2018-05-18 | Disposition: A | Discharge status: Home / Self Care | Payer: 59 | Source: Ambulatory Visit | Attending: Radiation Oncology | Admitting: Radiation Oncology

## 2018-05-18 ENCOUNTER — Encounter: Payer: Self-pay | Admitting: Oncology

## 2018-05-18 ENCOUNTER — Other Ambulatory Visit: Payer: Self-pay

## 2018-05-18 VITALS — BP 109/81 | HR 96 | Temp 98.5°F | Resp 18 | Ht 67.0 in | Wt 112.1 lb

## 2018-05-18 DIAGNOSIS — F17210 Nicotine dependence, cigarettes, uncomplicated: Secondary | ICD-10-CM | POA: Insufficient documentation

## 2018-05-18 DIAGNOSIS — Z79899 Other long term (current) drug therapy: Secondary | ICD-10-CM | POA: Insufficient documentation

## 2018-05-18 DIAGNOSIS — C20 Malignant neoplasm of rectum: Secondary | ICD-10-CM

## 2018-05-18 DIAGNOSIS — Z933 Colostomy status: Secondary | ICD-10-CM | POA: Insufficient documentation

## 2018-05-18 DIAGNOSIS — Z882 Allergy status to sulfonamides status: Secondary | ICD-10-CM | POA: Insufficient documentation

## 2018-05-18 HISTORY — DX: Migraine, unspecified, not intractable, without status migrainosus: G43.909

## 2018-05-18 NOTE — Progress Notes (Signed)
Met with patient and spouse whom brought proof of income for one-time $500 [place].  Patient approved for the grant. She has a copy of the approval letter and expense sheet along with the Outpatient Pharmacy information. Went over expenses and how they are paid. Gave her my card for any additional financial questions or concerns.

## 2018-05-18 NOTE — Progress Notes (Addendum)
Radiation Oncology         [phone] ________________________________  Name: Tina Morales        MRN: [id number]  Date of Service: 05/18/2018 DOB: 1966-01-25  GN:[id number], [place], MD     REFERRING PHYSICIAN: Ladell Pier, MD   DIAGNOSIS: The encounter diagnosis was Rectal cancer [facility]).   HISTORY OF PRESENT ILLNESS: Tina Morales is a 52 y.o. female seen at the request of Dr. Benay Spice for recent diagnosis of rectal cancer.  She had been experiencing several years of irregular bowel habits with intermittent diarrhea, and she developed increasing pain at the rectum.  She was originally diagnosed in August 2019 after undergoing a colostomy in the that revealed a circumferential mass in the rectum, biopsied as at least intramucosal adenocarcinoma arising in high-grade dysplasia.  A CT scan on 02/10/2018 revealed an anterior wall thickening of the rectum and what appeared to be metastatic disease in the pulmonary nodules measuring up to 9 mm.  She underwent a pelvic MRI on 8/91/6945 and hypermetabolic low rectal mass extending to the posterior vagina with with 2 small and large perirectal nodes was noted, she was classified as a T4BN1, unfortunately her PET scan on 02/23/2018 revealed hypermetabolism not only within the rectal mass, but also within a lingular nodule in the lung, and scattered bilateral nodules a few with mild increased activity.  She began FOLFOXIRI on 03/01/2018, she completed cycle #5 on 04/26/2018.  While she has had interval improvement with therapy, and a decrease in the primary rectal lesion. She relocated her care to [facility] after cycle #5 and she is continuing on with cycle #6 of her chemotherapy which was given on 05/11/2018 by Dr. Benay Spice.  She comes today to to consider local therapy with chemoRT to her primary tumor as she has had a good response thus far with chemotherapy.     PREVIOUS RADIATION THERAPY: No   PAST MEDICAL  HISTORY:  Past Medical History:  Diagnosis Date  . Migraine        PAST SURGICAL HISTORY: Past Surgical History:  Procedure Laterality Date  . TONSILLECTOMY       FAMILY HISTORY:  Family History  Problem Relation Age of Onset  . Breast cancer Maternal Aunt      SOCIAL HISTORY:  reports that she has been smoking. She does not have any smokeless tobacco history on file. The patient is married and lives in [place].   ALLERGIES: Sulfa antibiotics   MEDICATIONS:  Current Outpatient Medications  Medication Sig Dispense Refill  . acetaminophen (TYLENOL) 500 MG tablet Take by mouth.    . ALPRAZolam (XANAX) 0.5 MG tablet Take 1 tablet (0.5 mg total) by mouth 2 (two) times daily as needed. 40 tablet 0  . Calcium Polycarbophil (FIBER-CAPS PO) Take by mouth.    . dexamethasone (DECADRON) 4 MG tablet Take by mouth.    . lidocaine-prilocaine (EMLA) cream Apply 1 application topically as needed.    . Multiple Vitamin (MULTIVITAMIN) capsule Take 1 capsule by mouth daily.    Marland Kitchen omeprazole (PRILOSEC) 10 MG capsule Take 10 mg by mouth daily.    . ondansetron (ZOFRAN) 8 MG tablet Take 1 tablet (8 mg total) by mouth every 12 (twelve) hours as needed for nausea or vomiting. 20 tablet 1  . polyethylene glycol powder (GLYCOLAX/MIRALAX) powder Take by mouth.    . prochlorperazine (COMPAZINE) 10 MG tablet Take 1 tablet (10 mg total) by mouth every 6 (six) hours  as needed for nausea or vomiting. 20 tablet 1   No current facility-administered medications for this encounter.      REVIEW OF SYSTEMS: On review of systems, the patient reports that she is doing well overall. She denies any chest pain, shortness of breath, cough, fevers, chills, night sweats, unintended weight changes.She reports using some cathartics at times but reports she is not having rectal bleeding, her pain with passing stool has improved, and dyspareunia has also improved.  She denies any  bladder disturbances, and denies  abdominal pain, nausea or vomiting. She denies any new musculoskeletal or joint aches or pains. A complete review of systems is obtained and is otherwise negative.     PHYSICAL EXAM:  Wt Readings from Last 3 Encounters:  05/11/18 112 lb (50.8 kg)  04/27/18 109 lb 9.6 oz (49.7 kg)   Temp Readings from Last 3 Encounters:  05/13/18 98.3 F (36.8 C) (Tympanic)  05/11/18 98.7 F (37.1 C) (Oral)  04/27/18 98.5 F (36.9 C) (Oral)   BP Readings from Last 3 Encounters:  05/13/18 109/75  05/11/18 110/84  04/27/18 95/69   Pulse Readings from Last 3 Encounters:  05/13/18 73  05/11/18 67  04/27/18 82    In general this is a well appearing caucasian female in no acute distress. She is alert and oriented x4 and appropriate throughout the examination. HEENT reveals that the patient is normocephalic, atraumatic. EOMs are intact. Cardiopulmonary assessment is negative for acute distress and she exhibits normal effort.     ECOG = 1  0 - Asymptomatic (Fully active, able to carry on all predisease activities without restriction)  1 - Symptomatic but completely ambulatory (Restricted in physically strenuous activity but ambulatory and able to carry out work of a light or sedentary nature. For example, light housework, office work)  2 - Symptomatic, <50% in bed during the day (Ambulatory and capable of all self care but unable to carry out any work activities. Up and about more than 50% of waking hours)  3 - Symptomatic, >50% in bed, but not bedbound (Capable of only limited self-care, confined to bed or chair 50% or more of waking hours)  4 - Bedbound (Completely disabled. Cannot carry on any self-care. Totally confined to bed or chair)  5 - Death   Eustace Pen MM, Creech RH, Tormey DC, et al. [phone]). "Toxicity and response criteria of the [facility]". [place] Oncol. 5 (6): 649-55    LABORATORY DATA:  Lab Results  Component Value Date   WBC 4.6 05/11/2018   HGB  9.2 (L) 05/11/2018   HCT 29.2 (L) 05/11/2018   MCV 68.9 (L) 05/11/2018   PLT 225 05/11/2018   Lab Results  Component Value Date   NA 138 05/11/2018   K 4.1 05/11/2018   CL 102 05/11/2018   CO2 27 05/11/2018   Lab Results  Component Value Date   ALT 29 05/11/2018   AST 42 (H) 05/11/2018   ALKPHOS 102 05/11/2018   BILITOT 0.5 05/11/2018     She is a was she is meeting RADIOGRAPHY: Ct Outside Films Body  Result Date: 05/03/2018 This examination belongs to an outside facility and is stored here for comparison purposes only.  Contact the originating outside institution for any associated report or interpretation.      IMPRESSION/PLAN: 1. Stage IV, [id number] adenocarcinoma of the rectum.  Dr. Lisbeth Renshaw reviews her case and discusses the findings from her pretreatment imaging.  She was classified as stage IV  because of the 8 mm lingular nodule.  This has since cleared on post chemo imaging, and no additional nodules were seen when the scan was performed on 05/01/2018. There was a separate report however that showed the lesion at 3 mm from the same study. Question raised is whether we should be locally aggressive given her good performance status and response thus far.  Dr. Lisbeth Renshaw discusses her imaging. He is in agreement with aggressive local therapy and recommends radiotherapy with concurrent chemo. The patient is looking forward to proceeding in this way. She will also see Dr. Dema Severin next Wednesday to discuss surgical options. We discussed delivery, logistics, short, and long term effects of therapy as well and she is interested in proceeding. Written consent is obtained and placed in the chart, a copy was provided to the patient. 2. Lingular lesion. The patient was counseled that we would follow this with additional systemic imaging following therapy. This would likely benefit all providers when counseling on surveillance and or surgery post chemoRT.  In a visit lasting 60 minutes, greater than  50% of the time was spent face to face discussing her case, and coordinating the patient's care.  The above documentation reflects my direct findings during this shared patient visit. Please see the separate note by Dr. Lisbeth Renshaw on this date for the remainder of the patient's plan of care.    Carola Rhine, PAC

## 2018-05-20 ENCOUNTER — Other Ambulatory Visit: Payer: Self-pay | Admitting: Oncology

## 2018-05-26 ENCOUNTER — Inpatient Hospital Stay: Payer: 59

## 2018-05-26 ENCOUNTER — Telehealth: Payer: Self-pay

## 2018-05-26 ENCOUNTER — Inpatient Hospital Stay (HOSPITAL_BASED_OUTPATIENT_CLINIC_OR_DEPARTMENT_OTHER): Payer: 59 | Admitting: Oncology

## 2018-05-26 ENCOUNTER — Telehealth: Payer: Self-pay | Admitting: General Practice

## 2018-05-26 ENCOUNTER — Telehealth: Payer: Self-pay | Admitting: Pharmacist

## 2018-05-26 VITALS — BP 111/82 | HR 96 | Temp 98.9°F | Resp 18 | Ht 67.0 in | Wt 118.6 lb

## 2018-05-26 DIAGNOSIS — C20 Malignant neoplasm of rectum: Secondary | ICD-10-CM

## 2018-05-26 DIAGNOSIS — D649 Anemia, unspecified: Secondary | ICD-10-CM

## 2018-05-26 DIAGNOSIS — C9002 Multiple myeloma in relapse: Secondary | ICD-10-CM

## 2018-05-26 DIAGNOSIS — Z87891 Personal history of nicotine dependence: Secondary | ICD-10-CM | POA: Diagnosis not present

## 2018-05-26 LAB — CBC WITH DIFFERENTIAL (CANCER CENTER ONLY)
Abs Immature Granulocytes: 0.01 10*3/uL (ref 0.00–0.07)
BASOS PCT: 0 %
Basophils Absolute: 0 10*3/uL (ref 0.0–0.1)
EOS ABS: 0.1 10*3/uL (ref 0.0–0.5)
Eosinophils Relative: 3 %
HEMATOCRIT: 27.1 % — AB (ref 36.0–46.0)
Hemoglobin: 8.2 g/dL — ABNORMAL LOW (ref 12.0–15.0)
IMMATURE GRANULOCYTES: 0 %
Lymphocytes Relative: 37 %
Lymphs Abs: 1.3 10*3/uL (ref 0.7–4.0)
MCH: 21.6 pg — ABNORMAL LOW (ref 26.0–34.0)
MCHC: 30.3 g/dL (ref 30.0–36.0)
MCV: 71.5 fL — AB (ref 80.0–100.0)
MONO ABS: 0.7 10*3/uL (ref 0.1–1.0)
MONOS PCT: 21 %
NEUTROS ABS: 1.3 10*3/uL — AB (ref 1.7–7.7)
NEUTROS PCT: 39 %
PLATELETS: 133 10*3/uL — AB (ref 150–400)
RBC: 3.79 MIL/uL — ABNORMAL LOW (ref 3.87–5.11)
RDW: 19 % — AB (ref 11.5–15.5)
WBC: 3.4 10*3/uL — AB (ref 4.0–10.5)
nRBC: 0 % (ref 0.0–0.2)

## 2018-05-26 LAB — CMP (CANCER CENTER ONLY)
ALT: 18 U/L (ref 0–44)
AST: 31 U/L (ref 15–41)
Albumin: 3.1 g/dL — ABNORMAL LOW (ref 3.5–5.0)
Alkaline Phosphatase: 94 U/L (ref 38–126)
Anion gap: 8 (ref 5–15)
BILIRUBIN TOTAL: 0.5 mg/dL (ref 0.3–1.2)
BUN: 6 mg/dL (ref 6–20)
CHLORIDE: 104 mmol/L (ref 98–111)
CO2: 26 mmol/L (ref 22–32)
Calcium: 8.7 mg/dL — ABNORMAL LOW (ref 8.9–10.3)
Creatinine: 0.56 mg/dL (ref 0.44–1.00)
Glucose, Bld: 97 mg/dL (ref 70–99)
POTASSIUM: 3.7 mmol/L (ref 3.5–5.1)
Sodium: 138 mmol/L (ref 135–145)
TOTAL PROTEIN: 5.8 g/dL — AB (ref 6.5–8.1)

## 2018-05-26 MED ORDER — CAPECITABINE 500 MG PO TABS: ORAL_TABLET | ORAL | 0 refills | Status: DC

## 2018-05-26 MED ORDER — SODIUM CHLORIDE 0.9% FLUSH
10.0000 mL | Freq: Once | INTRAVENOUS | Status: AC
  Administered 2018-05-26: 10 mL via INTRAVENOUS
  Filled 2018-05-26: qty 10

## 2018-05-26 MED ORDER — OMEPRAZOLE 10 MG PO CPDR: 10.0000 mg | DELAYED_RELEASE_CAPSULE | Freq: Every day | ORAL | 2 refills | Status: DC

## 2018-05-26 MED ORDER — FERROUS SULFATE 325 (65 FE) MG PO TBEC: 325.0000 mg | DELAYED_RELEASE_TABLET | Freq: Two times a day (BID) | ORAL | 3 refills | Status: DC

## 2018-05-26 MED ORDER — HEPARIN SOD (PORK) LOCK FLUSH 100 UNIT/ML IV SOLN
500.0000 [IU] | Freq: Once | INTRAVENOUS | Status: AC
  Administered 2018-05-26: 500 [IU] via INTRAVENOUS
  Filled 2018-05-26: qty 5

## 2018-05-26 NOTE — Telephone Encounter (Signed)
Oral Oncology Pharmacist Encounter  Received new prescription for Xeloda (capecitabine) for the treatment of advanced/metastatic rectal cancer in conjunction with radiation, planned duration 28 treatments over 38 calendar days.  Patient has received 6 cycles of FOLFOXIRI initiated at [facility], and has decided to transfer treatment of rectal cancer to [facility].  Labs from 05/26/2018 assessed, okay for treatment. Noted decreased blood counts on CBC Labs will be repeated at initiation of therapy and will be monitored periodically during radiation treatment  Xeloda will be initiated at ~786 mg/m2 BID on days of radiation only  Current medication list in Epic reviewed, moderate DDI with Xeloda and omeprazole identified:  Category C interaction: Concurrent use of a proton pump inhibitor (PPI) disease was associated with poor progression free survival and overall survival among patients treated with capecitabine and oxaliplatin in a secondary analysis of patients receiving treatment for gastric cancer.  Multivariate analysis and adjustment for gender, cancer stage, age, and ECOG performance status, PPI use was no longer associated with a decrease in important efficacy outcomes.  No change to current therapy indicated at this time.  Prescription has been e-scribed to the [facility] for benefits analysis and approval.  Oral Oncology Clinic will continue to follow for insurance authorization, copayment issues, initial counseling and start date.  Johny Drilling, PharmD, BCPS, BCOP  05/26/2018 3:31 PM Oral Oncology Clinic [phone]

## 2018-05-26 NOTE — Telephone Encounter (Signed)
Printed avs and calender of upcoming appointment. Per 11/15 los 

## 2018-05-26 NOTE — Progress Notes (Signed)
Xeloda script to begin 06/12/18 forwarded to oral chemotherapy pharmacist folder.

## 2018-05-26 NOTE — Progress Notes (Signed)
[facility] OFFICE PROGRESS NOTE   Diagnosis: Rectal cancer  INTERVAL HISTORY:   Tina Morales completed another cycle of FOLFIRINOX 05/11/2018.  She tolerated the chemotherapy well.  He had less mouth sores following this cycle of chemotherapy.  No neuropathy symptoms.  Dryness of the hands.  No diarrhea. She saw Dr. Dema Severin yesterday and will see him again at the completion of radiation.  She is scheduled to begin rectal radiation on 06/12/2018.  Objective:  Vital signs in last 24 hours:  Blood pressure 111/82, pulse 96, temperature 98.9 F (37.2 C), temperature source Oral, resp. rate 18, height _0  (1.702 m), weight 118 lb 9.6 oz (53.8 kg), SpO2 100 %.    HEENT: No thrush or ulcers Resp: Lungs clear bilaterally Cardio: Regular rate and rhythm GI: No hepatomegaly, nontender Vascular: No leg edema  Skin: Palms without erythema  Portacath/PICC-without erythema  Lab Results:  Lab Results  Component Value Date   WBC 3.4 (L) 05/26/2018   HGB 8.2 (L) 05/26/2018   HCT 27.1 (L) 05/26/2018   MCV 71.5 (L) 05/26/2018   PLT 133 (L) 05/26/2018   NEUTROABS 1.3 (L) 05/26/2018    CMP  Lab Results  Component Value Date   NA 138 05/11/2018   K 4.1 05/11/2018   CL 102 05/11/2018   CO2 27 05/11/2018   GLUCOSE 97 05/11/2018   BUN 9 05/11/2018   CREATININE 0.57 05/11/2018   CALCIUM 9.3 05/11/2018   PROT 6.3 (L) 05/11/2018   ALBUMIN 3.3 (L) 05/11/2018   AST 42 (H) 05/11/2018   ALT 29 05/11/2018   ALKPHOS 102 05/11/2018   BILITOT 0.5 05/11/2018   GFRNONAA >60 05/11/2018   GFRAA >60 05/11/2018    Lab Results  Component Value Date   CEA1 7.92 (H) 05/11/2018     Medications: I have reviewed the patient's current medications.   Assessment/Plan: 1. Rectal cancer-rectal mass noted on digital exam 02/10/2018, colonoscopy confirmed a him my circumferential mass in the rectum ? 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 CTs 02/10/2018- anterior rectal wall thickening, pulmonary nodules measuring up to 9 mm concerning for metastases, few round perirectal lymph nodes ? Pelvic MRI 12/06/7822- hypermetabolic low rectal mass extending to the posterior vagina with 2 small enlarged perirectal lymph nodes, T4b,N1- 2.3 cm from the anal verge ? PET scan 02/23/2018-hypermetabolic rectal mass, 8 mm hypermetabolic lingular nodule, scattered small bilateral lung nodules, some calcified, a few with mildly increased activity ? Cycle 1 FOLFOXIRI8/21/2019 ? Cycle 5 FOLFOXIRI10/16/2019 ? CTs 05/01/2018-significant interval response to therapy with decreased size of the primary rectal mass lesion.  Decreasing perirectal lymphadenopathy.  Decreased and/or resolved pulmonary nodules.  No new sites of disease identified. ? Cycle 6 FOLFOXIRI 05/11/2018  2.History of diarrhea and rectal pain secondary to #1 3.History of tobacco use 4.   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  She tolerated the chemotherapy well.  Her rectal symptoms have improved.  The plan is to begin Xeloda/radiation 06/12/2018.  I reviewed potential toxicities associated with Xeloda including the chance for mouth sores, diarrhea, rash, sun sensitivity, hyperpigmentation, and hand/foot syndrome.  She agrees to proceed.  She has persistent microcytic anemia.  She reports a history of "thalassemia ".  The anemia may be secondary to thalassemia and iron deficiency.  She will begin a trial of ferrous sulfate.  We will check a CBC when she returns on 06/12/2018.  She will return for an office and lab visit on 06/23/2018.  25 minutes were spent with the patient today.  The  majority of the time was used for counseling and coordination of care.  Betsy Coder, MD  05/26/2018  8:53 AM

## 2018-05-26 NOTE — Addendum Note (Signed)
Addended by: Tania Ade on: 05/26/2018 09:48 AM   Modules accepted: Orders

## 2018-05-26 NOTE — Telephone Encounter (Signed)
[facility] CSW Progress Notes  Call from patient, questioning whether there is any way her case can be expedited.  Patient applied in mid September by phone with [employer]).  Has been notified that she will receive payments beginning Feb 2020.  Advised patient to call [employer] directly to ask if payments can be expedited given her medical condition.    Edwyna Shell, LCSW Clinical Social Worker Phone:  [phone]

## 2018-05-29 ENCOUNTER — Telehealth: Payer: Self-pay

## 2018-05-29 NOTE — Telephone Encounter (Signed)
Oral Oncology Patient Advocate Encounter  Received notification from [facility] that prior authorization for Xeloda is required.  PA submitted on CoverMyMeds Key [id number] Status is pending  Oral Oncology Clinic will continue to follow.  [place] Patient [place] Phone [phone] Fax [phone]

## 2018-05-31 ENCOUNTER — Telehealth: Payer: Self-pay

## 2018-05-31 NOTE — Telephone Encounter (Signed)
  Oncology Nurse Navigator Documentation   Patient called and voiced frustration that someone from [facility] had called stating that her Xeloda would cost $1,000. I reassured her that our Oral Chemo team was working on her behalf and would do the research for her on best price and where to get Xeloda. I gave her phone numbers for Johny Drilling [facility] and Margy Clarks to request update on status. Patient voiced understanding that she can call me with questions or concerns.   )

## 2018-06-01 ENCOUNTER — Ambulatory Visit
Admission: RE | Admit: 2018-06-01 | Discharge: 2018-06-01 | Disposition: A | Discharge status: Home / Self Care | Payer: 59 | Source: Ambulatory Visit | Attending: Radiation Oncology | Admitting: Radiation Oncology

## 2018-06-01 DIAGNOSIS — C20 Malignant neoplasm of rectum: Secondary | ICD-10-CM | POA: Insufficient documentation

## 2018-06-01 MED ORDER — CAPECITABINE 500 MG PO TABS: ORAL_TABLET | ORAL | 0 refills | Status: DC

## 2018-06-01 NOTE — Telephone Encounter (Signed)
Oral Oncology Pharmacist Encounter  I called and spoke with patient to provide update about improved insurance authorization and requirement of [employer] [employer] pharmacy as dispensing pharmacy for Xeloda. Patient was already aware of above information per call from her insurance company that occurred yesterday.  We discussed exorbitantly high copayment for Xeloda that she was informed about from her insurance company yesterday. Oral oncology patient advocate is continuing to research available options for copayment assistance for patient Xeloda.  Since Xeloda has been on the market for so long, and there are generic formulations available, it was our understanding that there was no longer manufacturer support for this product. We will follow-up with [employer] today to confirm this. There are no available grants for patient's diagnosis to help cover her copayment at this time.  We may be able to use mail order pharmacy called [facility], that has its own in-house assistance program, in order to help patient obtain this expensive medication. Prescription is being mandated to be filled at [facility] at this time. We will follow-up with dispensing pharmacy for copayment information and if copayment remains exorbitantly high will continue to research using [facility] as an option.  Patient expressed understanding and appreciation. All questions answered. We decided to wait to discuss initial counseling of Xeloda until medication acquisition issues have been resolved.  Patient knows to call the office with any additional questions or concerns.  Johny Drilling, PharmD, BCPS, BCOP  06/01/2018 11:23 AM Oral Oncology Clinic [phone]

## 2018-06-01 NOTE — Progress Notes (Signed)
  Radiation Oncology         [phone] ________________________________  Name: Tina Morales MRN: [id number]  Date: 06/01/2018  DOB: August 18, 1965  Optical Surface Tracking Plan:  Since intensity modulated radiotherapy (IMRT) and 3D conformal radiation treatment methods are predicated on accurate and precise positioning for treatment, intrafraction motion monitoring is medically necessary to ensure accurate and safe treatment delivery.  The ability to quantify intrafraction motion without excessive ionizing radiation dose can only be performed with optical surface tracking. Accordingly, surface imaging offers the opportunity to obtain 3D measurements of patient position throughout IMRT and 3D treatments without excessive radiation exposure.  I am ordering optical surface tracking for this patient's upcoming course of radiotherapy. ________________________________  Kyung Rudd, MD 06/01/2018 7:23 PM    Reference:   Particia Jasper, et al. Surface imaging-based analysis of intrafraction motion for breast radiotherapy patients.Journal of [place], n. 6, nov. 2014. ISSN [id number].   Available at: <http://www.jacmp.org/index.php/jacmp/article/view/4957>.

## 2018-06-01 NOTE — Telephone Encounter (Signed)
Oral Oncology Patient Advocate Encounter  Prior Authorization for Xeloda has been approved.    PA# [id number]  Effective dates: 05/31/18 through 07/30/18  Oral Oncology Clinic will continue to follow.   [place] Patient [place] Phone [phone] Fax [phone]

## 2018-06-01 NOTE — Telephone Encounter (Signed)
Oral Oncology Pharmacist Encounter  Received notification that Xeloda prior authorization has been approved by patient's prescription insurance. Xeloda prescriptions must be filled at [facility] per insurance requirement. Xeloda prescription has been e-scribed to their [place], [place] location.  Johny Drilling, PharmD, BCPS, BCOP  06/01/2018 11:03 AM Oral Oncology Clinic [phone]

## 2018-06-01 NOTE — Progress Notes (Signed)
  Radiation Oncology         [phone] ________________________________  Name: Tina Morales MRN: [id number]  Date: 06/01/2018  DOB: 06-05-1966   SIMULATION AND TREATMENT PLANNING NOTE  DIAGNOSIS:     ICD-10-CM   1. Rectal cancer ([place]) C20      The patient presented for simulation for the patient's upcoming course of radiation for the diagnosis of rectal cancer. The patient was placed in a supine position. A customized vac-lock bag was constructed to aid in patient immobilization on. This complex treatment device will be used on a daily basis during the treatment. In this fashion a CT scan was obtained through the pelvic region and the isocenter was placed near midline within the pelvis. Surface markings were placed.  The patient's imaging was loaded into the radiation treatment planning system. The patient will initially be planned to receive a course of radiation to a dose of 45 Gy. This will be accomplished in 25 fractions at 1.8 gray per fraction. This initial treatment will correspond to a 3-D conformal technique. The target has been contoured in addition to the rectum, bladder and femoral heads. Dose volume histograms of each of these structures have been requested and these will be carefully reviewed as part of the 3-D conformal treatment planning process. To accomplish this initial treatment, 4 customized blocks have been designed for this purpose. Each of these 4 complex treatment devices will be used on a daily basis during the initial course of the treatment. It is anticipated that the patient will then receive a boost for an additional 5.4 Gy. The anticipated total dose therefore will be 50.4 Gy.    Special treatment procedure The patient will receive chemotherapy during the course of radiation treatment. The patient may experience increased or overlapping toxicity due to this combined-modality approach and the patient will be monitored for such problems. This may include extra lab  work as necessary. This therefore constitutes a special treatment procedure.    ________________________________  Jodelle Gross, MD, PhD

## 2018-06-02 NOTE — Telephone Encounter (Signed)
Oral Oncology Pharmacist Encounter  Spoke with pharmacist at [facility] for prescription clarification for patient's Xeloda. Patient is to receive Xeloda for 28 radiation treatments over 38 calendar days. Pharmacy is only able to dispense a 30 calendar day supply at a time so they will need to split up patient's Xeloda prescription into 2 separate fills. Pharmacist unable to share any copayment information at this time. Oral oncology clinic will follow up with dispensing pharmacy for this information.  I called and updated patient with above information.  Patient informed that [employer] does not provide manufacturer assistance in the way of co-pay cards or a patient assistance program for Xeloda.  Patient informed that there are currently no copayment foundation grants available for her diagnosis either.  We talked through available coupon options such as GoodRx and Rx outreach. Coupon for GoodRx to use at [employer] has been printed for patient in case it is needed, coupon should provide entire course of therapy (quantity #140 tablets) for $401.24.  We also discussed the option of using [facility] specialty pharmacy once we are able to receive copayment information from [facility], as this is the required dispensing pharmacy per patient's insurance.  Patient expressed understanding and appreciation. We will follow-up with the patient early next week.  Johny Drilling, PharmD, BCPS, BCOP  06/02/2018 2:09 PM Oral Oncology Clinic [phone]

## 2018-06-03 DIAGNOSIS — C20 Malignant neoplasm of rectum: Secondary | ICD-10-CM | POA: Diagnosis not present

## 2018-06-05 NOTE — Telephone Encounter (Signed)
Oral Oncology Pharmacist Encounter  Received request from [facility] to fax over insurance and demographic information, as well as patient medication list. This information plus copy of insurance authorization has been faxed to [facility] at [phone] I also called back and left voicemail for representative with above information [phone] x. [phone]).  Johny Drilling, PharmD, BCPS, BCOP  06/05/2018 2:02 PM Oral Oncology Clinic [phone]

## 2018-06-05 NOTE — Telephone Encounter (Signed)
Oral Oncology Patient Advocate Encounter  I called [facility] to follow up on the prescription status. It is currently in Pharmacist, community. I asked if they would know the copay today and I was instructed to call back toward the end of the day and it should be available then.  I will continue to update  [place] Patient [place] Phone [phone] Fax [phone]

## 2018-06-06 MED ORDER — CAPECITABINE 500 MG PO TABS: ORAL_TABLET | ORAL | 0 refills | Status: DC

## 2018-06-06 NOTE — Telephone Encounter (Signed)
Oral Oncology Patient Advocate Encounter  I filled out the [employer] form and called the patient. I needed her househould size and income and she provided that. Tina Morales will come in on Wednesday 06/07/18 to sign the form.  I will continue to update  [place] Patient [place] Phone [phone] Fax [phone]

## 2018-06-06 NOTE — Telephone Encounter (Signed)
Oral Oncology Patient Advocate Encounter  I called [id number] to follow up on the processing of the Xeloda script. I found out that the patient just needs to call and pay the total amount of  $225 for #140 tablets in order to receive the medicine. They have several different shipping options for her choose from depending on how much she wants to pay. They are open 8-6:30 and advised to call first thing in the morning due to long wait times in the afternoon/evening. Also advised to call this evening if she needs expedited shipping.  I called her and left a message with this information and the phone number to call [facility] [phone]. I also left my phone number in case she had any other questions for me.  Tina Morales Patient [place] Phone [phone] Fax [phone]

## 2018-06-06 NOTE — Telephone Encounter (Signed)
Oral Oncology Pharmacist Encounter  Received call from patient with information provided to her from [place]. Patient states she spoke with [facility] this morning and co-pay is prohibitively expensive. She states that representative from dispensing pharmacy alerted her that she is able to use [facility] for dispensing of her Xeloda (capecitabine) and would be able to receive quantity #140 tablets for $135.  This will be researched by oral oncology clinic. We will continue to update.  Johny Drilling, PharmD, BCPS, BCOP  06/06/2018 9:54 AM Oral Oncology Clinic [phone]

## 2018-06-06 NOTE — Telephone Encounter (Signed)
Oral Oncology Pharmacist Encounter  Oral oncology patient advocate is working with patient to get enrolled with [facility] dispensing pharmacy. Xeloda prescription has been E scribed to Rx [facility]. We will continue to follow-up and update patient as needed.  Johny Drilling, PharmD, BCPS, BCOP  06/06/2018 10:20 AM Oral Oncology Clinic [phone]

## 2018-06-06 NOTE — Telephone Encounter (Signed)
Oral Oncology Patient Advocate Encounter  I was able to create an account online with [facility] and enroll the patient on their site. She will not be coming in to sign the application.   [facility] will need a prescription either escribed (NCPDP [id number]) or faxed to [phone]. I will follow up with [facility] this afternoon after the prescription has been sent to make sure they have what they need from [place].  I will continue to update here.  [place] Patient [place] Phone [phone] Fax [phone]

## 2018-06-07 ENCOUNTER — Telehealth: Payer: Self-pay | Admitting: *Deleted

## 2018-06-07 ENCOUNTER — Telehealth: Payer: Self-pay

## 2018-06-07 NOTE — Telephone Encounter (Signed)
Oral Chemotherapy Pharmacist Encounter   I spoke with patient for overview of: Xeloda (capecitabine) for the treatment of advanced/metastatic rectal cancer in conjunction with radiation, planned duration 28 treatments over 38 calendar days.   Counseled patient on administration, dosing, side effects, monitoring, drug-food interactions, safe handling, storage, and disposal.  Patient will take Xeloda 500mg  tablets, 3 tablets (1500mg ) by mouth in AM and 2 tabs (1000mg ) by mouth in PM, within 30 minutes of finishing meals, on days of radiation only.  Xeloda and radiation start date: 06/12/2018  Adverse effects of Xeloda include but are not limited to: fatigue, decreased blood counts, GI upset, diarrhea, mouth sores, and hand-foot syndrome.  Patient has anti-emetic on hand and knows to take it if nausea develops.   Patient will obtain anti diarrheal and alert the office of 4 or more loose stools above baseline.   Patient states that she normally tends towards constipation, but will alert the office if she has a drastic change in bowel habits. Patient states that she did experience mouth sores with IV chemotherapy. We discussed that incidence of mouth sores is decreased when taking Xeloda alone as compared to her combination IV chemotherapy. We discussed ways to manage mouth sores if they should occur.  Reviewed with patient importance of keeping a medication schedule and plan for any missed doses.  Tina Morales voiced understanding and appreciation.   All questions answered.   Medication reconciliation performed and medication/allergy list updated.  Patient states she spoke with [facility] pharmacy this morning and scheduled delivery of her Xeloda. She will receive her entire course of Xeloda with this one shipment. Patient states that representative was not able to guarantee that she would receive her Xeloda by this coming Monday (06/12/2018). She states she has already left a message for GI  nurse navigator, [employer].  Patient knows to call the office with questions or concerns. Oral Oncology Clinic will continue to follow.  Tina Morales, PharmD, BCPS, BCOP  06/07/2018  11:50 AM Oral Oncology Clinic [phone]

## 2018-06-07 NOTE — Telephone Encounter (Signed)
Patient left VM to advise that her Xeloda prescription went through and she is hoping that she will receive them in the mail today. She had questions about when to start taking the prescription. In basket message sent to pharmacist and collaborative nurse.

## 2018-06-07 NOTE — Telephone Encounter (Signed)
Called patient and she clarified that the Xeloda was just ordered today. Not sure if it will arrive by Monday. Informed her if she is 1-2 days late starting the oral chemo that is fine. If it does arrive in time, she is to begin taking it on Monday, 06/12/18 twice daily with food. She understands and reports that she has already spoken with Mosetta Pigeon, PharmD about this.

## 2018-06-07 NOTE — Telephone Encounter (Signed)
-----   Message from Arna Snipe, RN sent at 06/07/2018 11:24 AM EST ----- Regarding: Xeloda Received a VM from [name] advising that her Xeloda prescription finally went through and the pharmacy is hoping to get the pills delivered to her today. She had questions about when to start the pills.  Dawn

## 2018-06-12 ENCOUNTER — Ambulatory Visit
Admission: RE | Admit: 2018-06-12 | Discharge: 2018-06-12 | Disposition: A | Discharge status: Home / Self Care | Payer: 59 | Source: Ambulatory Visit | Attending: Radiation Oncology | Admitting: Radiation Oncology

## 2018-06-12 ENCOUNTER — Inpatient Hospital Stay: Payer: 59

## 2018-06-12 DIAGNOSIS — C20 Malignant neoplasm of rectum: Secondary | ICD-10-CM | POA: Insufficient documentation

## 2018-06-12 LAB — CBC WITH DIFFERENTIAL (CANCER CENTER ONLY)
ABS IMMATURE GRANULOCYTES: 0.06 10*3/uL (ref 0.00–0.07)
BASOS PCT: 1 %
Basophils Absolute: 0 10*3/uL (ref 0.0–0.1)
EOS ABS: 0.1 10*3/uL (ref 0.0–0.5)
Eosinophils Relative: 2 %
HEMATOCRIT: 32.9 % — AB (ref 36.0–46.0)
Hemoglobin: 9.9 g/dL — ABNORMAL LOW (ref 12.0–15.0)
IMMATURE GRANULOCYTES: 1 %
LYMPHS ABS: 2.5 10*3/uL (ref 0.7–4.0)
Lymphocytes Relative: 41 %
MCH: 21.5 pg — ABNORMAL LOW (ref 26.0–34.0)
MCHC: 30.1 g/dL (ref 30.0–36.0)
MCV: 71.4 fL — ABNORMAL LOW (ref 80.0–100.0)
Monocytes Absolute: 0.8 10*3/uL (ref 0.1–1.0)
Monocytes Relative: 13 %
NEUTROS ABS: 2.6 10*3/uL (ref 1.7–7.7)
NEUTROS PCT: 42 %
NRBC: 0 % (ref 0.0–0.2)
Platelet Count: 220 10*3/uL (ref 150–400)
RBC: 4.61 MIL/uL (ref 3.87–5.11)
RDW: 17 % — AB (ref 11.5–15.5)
WBC Count: 6.2 10*3/uL (ref 4.0–10.5)

## 2018-06-13 ENCOUNTER — Ambulatory Visit
Admission: RE | Admit: 2018-06-13 | Discharge: 2018-06-13 | Disposition: A | Discharge status: Home / Self Care | Payer: 59 | Source: Ambulatory Visit | Attending: Radiation Oncology | Admitting: Radiation Oncology

## 2018-06-13 DIAGNOSIS — C20 Malignant neoplasm of rectum: Secondary | ICD-10-CM | POA: Diagnosis not present

## 2018-06-13 NOTE — Telephone Encounter (Signed)
Oral Oncology Patient Advocate Encounter  I confirmed with the patient that she received the Xeloda from [facility] on 06/12/18 with a $225 copay. She has taken last night and this mornings dose.   [place] Patient [place] Phone [phone] Fax [phone]

## 2018-06-13 NOTE — Progress Notes (Signed)
Yes she has f/u 12/13

## 2018-06-14 ENCOUNTER — Ambulatory Visit
Admission: RE | Admit: 2018-06-14 | Discharge: 2018-06-14 | Disposition: A | Discharge status: Home / Self Care | Payer: 59 | Source: Ambulatory Visit | Attending: Radiation Oncology | Admitting: Radiation Oncology

## 2018-06-14 DIAGNOSIS — C20 Malignant neoplasm of rectum: Secondary | ICD-10-CM | POA: Diagnosis not present

## 2018-06-15 ENCOUNTER — Ambulatory Visit
Admission: RE | Admit: 2018-06-15 | Discharge: 2018-06-15 | Disposition: A | Discharge status: Home / Self Care | Payer: 59 | Source: Ambulatory Visit | Attending: Radiation Oncology | Admitting: Radiation Oncology

## 2018-06-15 DIAGNOSIS — C20 Malignant neoplasm of rectum: Secondary | ICD-10-CM | POA: Diagnosis not present

## 2018-06-16 ENCOUNTER — Ambulatory Visit
Admission: RE | Admit: 2018-06-16 | Discharge: 2018-06-16 | Disposition: A | Discharge status: Home / Self Care | Payer: 59 | Source: Ambulatory Visit | Attending: Radiation Oncology | Admitting: Radiation Oncology

## 2018-06-16 DIAGNOSIS — C20 Malignant neoplasm of rectum: Secondary | ICD-10-CM | POA: Diagnosis not present

## 2018-06-19 ENCOUNTER — Ambulatory Visit
Admission: RE | Admit: 2018-06-19 | Discharge: 2018-06-19 | Disposition: A | Discharge status: Home / Self Care | Payer: 59 | Source: Ambulatory Visit | Attending: Radiation Oncology | Admitting: Radiation Oncology

## 2018-06-19 DIAGNOSIS — C20 Malignant neoplasm of rectum: Secondary | ICD-10-CM | POA: Diagnosis not present

## 2018-06-20 ENCOUNTER — Other Ambulatory Visit: Payer: Self-pay | Admitting: Nurse Practitioner

## 2018-06-20 ENCOUNTER — Ambulatory Visit
Admission: RE | Admit: 2018-06-20 | Discharge: 2018-06-20 | Disposition: A | Discharge status: Home / Self Care | Payer: 59 | Source: Ambulatory Visit | Attending: Radiation Oncology | Admitting: Radiation Oncology

## 2018-06-20 DIAGNOSIS — C20 Malignant neoplasm of rectum: Secondary | ICD-10-CM

## 2018-06-21 ENCOUNTER — Other Ambulatory Visit: Payer: Self-pay | Admitting: Emergency Medicine

## 2018-06-21 ENCOUNTER — Ambulatory Visit
Admission: RE | Admit: 2018-06-21 | Discharge: 2018-06-21 | Disposition: A | Discharge status: Home / Self Care | Payer: 59 | Source: Ambulatory Visit | Attending: Radiation Oncology | Admitting: Radiation Oncology

## 2018-06-21 DIAGNOSIS — C20 Malignant neoplasm of rectum: Secondary | ICD-10-CM | POA: Diagnosis not present

## 2018-06-21 MED ORDER — ALPRAZOLAM 0.5 MG PO TABS: 0.5000 mg | ORAL_TABLET | Freq: Two times a day (BID) | ORAL | 0 refills | Status: DC | PRN

## 2018-06-22 ENCOUNTER — Ambulatory Visit
Admission: RE | Admit: 2018-06-22 | Discharge: 2018-06-22 | Disposition: A | Discharge status: Home / Self Care | Payer: 59 | Source: Ambulatory Visit | Attending: Radiation Oncology | Admitting: Radiation Oncology

## 2018-06-22 DIAGNOSIS — C20 Malignant neoplasm of rectum: Secondary | ICD-10-CM | POA: Diagnosis not present

## 2018-06-23 ENCOUNTER — Inpatient Hospital Stay: Payer: 59

## 2018-06-23 ENCOUNTER — Encounter: Payer: Self-pay | Admitting: Nurse Practitioner

## 2018-06-23 ENCOUNTER — Inpatient Hospital Stay (HOSPITAL_BASED_OUTPATIENT_CLINIC_OR_DEPARTMENT_OTHER): Payer: 59 | Admitting: Nurse Practitioner

## 2018-06-23 ENCOUNTER — Ambulatory Visit
Admission: RE | Admit: 2018-06-23 | Discharge: 2018-06-23 | Disposition: A | Discharge status: Home / Self Care | Payer: 59 | Source: Ambulatory Visit | Attending: Radiation Oncology | Admitting: Radiation Oncology

## 2018-06-23 VITALS — BP 108/77 | HR 97 | Temp 98.5°F | Resp 17 | Ht 67.0 in | Wt 126.8 lb

## 2018-06-23 DIAGNOSIS — D649 Anemia, unspecified: Secondary | ICD-10-CM | POA: Diagnosis not present

## 2018-06-23 DIAGNOSIS — C20 Malignant neoplasm of rectum: Secondary | ICD-10-CM

## 2018-06-23 DIAGNOSIS — Z87891 Personal history of nicotine dependence: Secondary | ICD-10-CM

## 2018-06-23 DIAGNOSIS — R918 Other nonspecific abnormal finding of lung field: Secondary | ICD-10-CM

## 2018-06-23 DIAGNOSIS — Z95828 Presence of other vascular implants and grafts: Secondary | ICD-10-CM

## 2018-06-23 DIAGNOSIS — D569 Thalassemia, unspecified: Secondary | ICD-10-CM

## 2018-06-23 LAB — CBC WITH DIFFERENTIAL (CANCER CENTER ONLY)
Abs Immature Granulocytes: 0.02 10*3/uL (ref 0.00–0.07)
Basophils Absolute: 0 10*3/uL (ref 0.0–0.1)
Basophils Relative: 0 %
EOS ABS: 0.3 10*3/uL (ref 0.0–0.5)
Eosinophils Relative: 7 %
HEMATOCRIT: 28.9 % — AB (ref 36.0–46.0)
Hemoglobin: 9.1 g/dL — ABNORMAL LOW (ref 12.0–15.0)
Immature Granulocytes: 1 %
Lymphocytes Relative: 15 %
Lymphs Abs: 0.6 10*3/uL — ABNORMAL LOW (ref 0.7–4.0)
MCH: 22.1 pg — ABNORMAL LOW (ref 26.0–34.0)
MCHC: 31.5 g/dL (ref 30.0–36.0)
MCV: 70.3 fL — ABNORMAL LOW (ref 80.0–100.0)
Monocytes Absolute: 0.4 10*3/uL (ref 0.1–1.0)
Monocytes Relative: 11 %
Neutro Abs: 2.6 10*3/uL (ref 1.7–7.7)
Neutrophils Relative %: 66 %
Platelet Count: 154 10*3/uL (ref 150–400)
RBC: 4.11 MIL/uL (ref 3.87–5.11)
RDW: 15.9 % — AB (ref 11.5–15.5)
WBC Count: 3.9 10*3/uL — ABNORMAL LOW (ref 4.0–10.5)
nRBC: 0.5 % — ABNORMAL HIGH (ref 0.0–0.2)

## 2018-06-23 LAB — CMP (CANCER CENTER ONLY)
ALT: 12 U/L (ref 0–44)
AST: 24 U/L (ref 15–41)
Albumin: 3.6 g/dL (ref 3.5–5.0)
Alkaline Phosphatase: 111 U/L (ref 38–126)
Anion gap: 7 (ref 5–15)
BUN: 8 mg/dL (ref 6–20)
CO2: 27 mmol/L (ref 22–32)
Calcium: 8.7 mg/dL — ABNORMAL LOW (ref 8.9–10.3)
Chloride: 103 mmol/L (ref 98–111)
Creatinine: 0.6 mg/dL (ref 0.44–1.00)
GFR, Est AFR Am: >60 mL/min (ref >60)
GFR, Estimated: >60 mL/min (ref >60)
Glucose, Bld: 92 mg/dL (ref 70–99)
Potassium: 3.9 mmol/L (ref 3.5–5.1)
Sodium: 137 mmol/L (ref 135–145)
Total Bilirubin: 0.5 mg/dL (ref 0.3–1.2)
Total Protein: 6 g/dL — ABNORMAL LOW (ref 6.5–8.1)

## 2018-06-23 MED ORDER — SODIUM CHLORIDE 0.9% FLUSH
10.0000 mL | Freq: Once | INTRAVENOUS | Status: AC
  Administered 2018-06-23: 10 mL via INTRAVENOUS
  Filled 2018-06-23: qty 10

## 2018-06-23 MED ORDER — HEPARIN SOD (PORK) LOCK FLUSH 100 UNIT/ML IV SOLN
500.0000 [IU] | Freq: Once | INTRAVENOUS | Status: AC
  Administered 2018-06-23: 500 [IU] via INTRAVENOUS
  Filled 2018-06-23: qty 5

## 2018-06-23 NOTE — Progress Notes (Signed)
  [facility] OFFICE PROGRESS NOTE   Diagnosis: Rectal cancer  INTERVAL HISTORY:   Tina Morales returns as scheduled.  She began radiation and Xeloda on 06/12/2018.  She denies nausea/vomiting.  No mouth sores.  She had a single loose stool earlier today.  Otherwise no diarrhea.  No hand or foot pain or redness.  No bleeding with bowel movements.  Objective:  Vital signs in last 24 hours:  Blood pressure 108/77, pulse 97, temperature 98.5 F (36.9 C), temperature source Oral, resp. rate 17, height '5\' 7"'$  (1.702 m), weight 126 lb 12.8 oz (57.5 kg), SpO2 99 %.    HEENT: No thrush or ulcers. Resp: Lungs clear bilaterally. Cardio: Regular rate and rhythm. GI: Abdomen soft and nontender.  No hepatomegaly. Vascular: No leg edema.  Calves soft and nontender.  Skin: Palms without erythema.   Port-A-Cath without erythema.   Lab Results:  Lab Results  Component Value Date   WBC 3.9 (L) 06/23/2018   HGB 9.1 (L) 06/23/2018   HCT 28.9 (L) 06/23/2018   MCV 70.3 (L) 06/23/2018   PLT 154 06/23/2018   NEUTROABS 2.6 06/23/2018    Imaging:  No results found.  Medications: I have reviewed the patient's current medications.  Assessment/Plan: 1. Rectal cancer-rectal mass noted on digital exam 02/10/2018, colonoscopy confirmed a him my circumferential mass in the rectum ? 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 CTs 02/10/2018-anterior rectal wall thickening, pulmonary nodules measuring up to 9 mm concerning for metastases, few round perirectal lymph nodes ? Pelvic MRI 02/20/2018-hypermetabolic low rectal mass extending to the posterior vagina with 2 small enlarged perirectal lymph nodes, T4b,N1-2.3 cm from the anal verge ? PET scan  02/23/2018-hypermetabolic rectal mass, 8 mm hypermetabolic lingular nodule, scattered small bilateral lung nodules, some calcified, a few with mildly increased activity ? Cycle 1 FOLFOXIRI8/21/2019 ? Cycle 5 FOLFOXIRI10/16/2019 ? CTs 05/01/2018-significant interval response to therapy with decreased size of the primary rectal mass lesion. Decreasing perirectal lymphadenopathy. Decreased and/or resolved pulmonary nodules. No new sites of disease identified. ? Cycle 6 FOLFOXIRI10/31/2019 ? Radiation/Xeloda 06/12/2018  2.History of diarrhea and rectal pain secondary to #1 3.History of tobacco use 4.   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  She will continue radiation and Xeloda.  She seems to be tolerating the Xeloda well.  We reviewed the CBC from today.  Counts are adequate to proceed with treatment as above.  She will return for lab and follow-up on 07/10/2018.  She will contact the office in the interim with any problems.  Plan reviewed with Dr. Benay Spice.    Ned Card ANP/GNP-BC   06/23/2018  2:12 PM

## 2018-06-26 ENCOUNTER — Telehealth: Payer: Self-pay | Admitting: *Deleted

## 2018-06-26 ENCOUNTER — Telehealth: Payer: Self-pay | Admitting: Nurse Practitioner

## 2018-06-26 ENCOUNTER — Ambulatory Visit
Admission: RE | Admit: 2018-06-26 | Discharge: 2018-06-26 | Disposition: A | Discharge status: Home / Self Care | Payer: 59 | Source: Ambulatory Visit | Attending: Radiation Oncology | Admitting: Radiation Oncology

## 2018-06-26 DIAGNOSIS — C20 Malignant neoplasm of rectum: Secondary | ICD-10-CM | POA: Diagnosis not present

## 2018-06-26 NOTE — Telephone Encounter (Signed)
Scheduled appt per 12/16 sch message. and 12/13 los - pt is aware of appts.

## 2018-06-26 NOTE — Telephone Encounter (Signed)
Unable to make the lab/flush appointment on 07/10/18 due to appointment with Dr. Dema Severin (surgeon) at 1:45 pm that day. Will move her lab/flush to 12/27 and then see NP after her RT on 12/30. Scheduling message sent.

## 2018-06-27 ENCOUNTER — Telehealth: Payer: Self-pay | Admitting: Pharmacist

## 2018-06-27 ENCOUNTER — Ambulatory Visit
Admission: RE | Admit: 2018-06-27 | Discharge: 2018-06-27 | Disposition: A | Discharge status: Home / Self Care | Payer: 59 | Source: Ambulatory Visit | Attending: Radiation Oncology | Admitting: Radiation Oncology

## 2018-06-27 DIAGNOSIS — C20 Malignant neoplasm of rectum: Secondary | ICD-10-CM | POA: Diagnosis not present

## 2018-06-27 NOTE — Telephone Encounter (Signed)
Oral Oncology Pharmacist Encounter  Received call from patient this morning about timing of loperamide administration around Xeloda administration for the management of diarrhea symptoms.  Xeloda start date: 06/12/18  Patient states diarrhea started this past Friday (06/23/18) and has gotten progressively worse over the weekend. She has had 3 episodes of diarrhea so far this morning.  Patient informed she can take loperamide as needed for the management of symptoms and does not have to worry about timing of loperamide in reference to her Xeloda administration.  Patient instructed to take loperamide 4mg  for her 1st dose then 2mg  as needed after each subsequent loose stool. She can disregard maximum dosing on loperamide packaging as we know the origin of her symptoms.  Patient instructed to record amount of loperamide used and degree of symptom control with its use. An alternate agent for symptom management can be prescribed by Dr. Benay Spice if symptoms not controlled with loperamide.  Patient with questions about "a cream for her bottom" and states that she asked the folks in radiation about this and feels that the request was not taken seriously. Patient states she was instructed by Ned Card, NP, to ask radiation again about the cream. Patient instructed to follw Lisa's suggestion.  Patient states that she continues her Xeloda 500mg  tablets, 3 tablets in the AM (1500mg ) and 2 tablets (1000mg ) in PM, immediately after food, taken on radiation days only.  Patient expressed understanding and appreciation. She knows to call the office with any additional questions or concerns.  Johny Drilling, PharmD, BCPS, BCOP  06/27/2018 10:49 AM Oral Oncology [facility] [phone]

## 2018-06-28 ENCOUNTER — Ambulatory Visit
Admission: RE | Admit: 2018-06-28 | Discharge: 2018-06-28 | Disposition: A | Discharge status: Home / Self Care | Payer: 59 | Source: Ambulatory Visit | Attending: Radiation Oncology | Admitting: Radiation Oncology

## 2018-06-28 DIAGNOSIS — C20 Malignant neoplasm of rectum: Secondary | ICD-10-CM | POA: Diagnosis not present

## 2018-06-29 ENCOUNTER — Ambulatory Visit
Admission: RE | Admit: 2018-06-29 | Discharge: 2018-06-29 | Disposition: A | Discharge status: Home / Self Care | Payer: 59 | Source: Ambulatory Visit | Attending: Radiation Oncology | Admitting: Radiation Oncology

## 2018-06-29 DIAGNOSIS — C20 Malignant neoplasm of rectum: Secondary | ICD-10-CM | POA: Diagnosis not present

## 2018-06-30 ENCOUNTER — Ambulatory Visit
Admission: RE | Admit: 2018-06-30 | Discharge: 2018-06-30 | Disposition: A | Discharge status: Home / Self Care | Payer: 59 | Source: Ambulatory Visit | Attending: Radiation Oncology | Admitting: Radiation Oncology

## 2018-06-30 DIAGNOSIS — C20 Malignant neoplasm of rectum: Secondary | ICD-10-CM | POA: Diagnosis not present

## 2018-07-03 ENCOUNTER — Ambulatory Visit
Admission: RE | Admit: 2018-07-03 | Discharge: 2018-07-03 | Disposition: A | Discharge status: Home / Self Care | Payer: 59 | Source: Ambulatory Visit | Attending: Radiation Oncology | Admitting: Radiation Oncology

## 2018-07-03 DIAGNOSIS — C20 Malignant neoplasm of rectum: Secondary | ICD-10-CM | POA: Diagnosis not present

## 2018-07-04 ENCOUNTER — Ambulatory Visit
Admission: RE | Admit: 2018-07-04 | Discharge: 2018-07-04 | Disposition: A | Discharge status: Home / Self Care | Payer: 59 | Source: Ambulatory Visit | Attending: Radiation Oncology | Admitting: Radiation Oncology

## 2018-07-04 DIAGNOSIS — C20 Malignant neoplasm of rectum: Secondary | ICD-10-CM | POA: Diagnosis not present

## 2018-07-06 ENCOUNTER — Ambulatory Visit
Admission: RE | Admit: 2018-07-06 | Discharge: 2018-07-06 | Disposition: A | Discharge status: Home / Self Care | Payer: 59 | Source: Ambulatory Visit | Attending: Radiation Oncology | Admitting: Radiation Oncology

## 2018-07-06 DIAGNOSIS — C20 Malignant neoplasm of rectum: Secondary | ICD-10-CM | POA: Diagnosis not present

## 2018-07-07 ENCOUNTER — Inpatient Hospital Stay: Payer: 59

## 2018-07-07 ENCOUNTER — Ambulatory Visit
Admission: RE | Admit: 2018-07-07 | Discharge: 2018-07-07 | Disposition: A | Discharge status: Home / Self Care | Payer: 59 | Source: Ambulatory Visit | Attending: Radiation Oncology | Admitting: Radiation Oncology

## 2018-07-07 DIAGNOSIS — C20 Malignant neoplasm of rectum: Secondary | ICD-10-CM

## 2018-07-07 DIAGNOSIS — Z95828 Presence of other vascular implants and grafts: Secondary | ICD-10-CM | POA: Insufficient documentation

## 2018-07-07 LAB — CMP (CANCER CENTER ONLY)
ALBUMIN: 3.5 g/dL (ref 3.5–5.0)
ALT: 13 U/L (ref 0–44)
ANION GAP: 7 (ref 5–15)
AST: 19 U/L (ref 15–41)
Alkaline Phosphatase: 104 U/L (ref 38–126)
BUN: 7 mg/dL (ref 6–20)
CO2: 29 mmol/L (ref 22–32)
Calcium: 8.8 mg/dL — ABNORMAL LOW (ref 8.9–10.3)
Chloride: 103 mmol/L (ref 98–111)
Creatinine: 0.58 mg/dL (ref 0.44–1.00)
GFR, Est AFR Am: >60 mL/min (ref >60)
GFR, Estimated: >60 mL/min (ref >60)
GLUCOSE: 103 mg/dL — AB (ref 70–99)
POTASSIUM: 3.5 mmol/L (ref 3.5–5.1)
Sodium: 139 mmol/L (ref 135–145)
Total Bilirubin: 0.6 mg/dL (ref 0.3–1.2)
Total Protein: 5.9 g/dL — ABNORMAL LOW (ref 6.5–8.1)

## 2018-07-07 LAB — CBC WITH DIFFERENTIAL (CANCER CENTER ONLY)
Abs Immature Granulocytes: 0.03 10*3/uL (ref 0.00–0.07)
Basophils Absolute: 0 10*3/uL (ref 0.0–0.1)
Basophils Relative: 0 %
Eosinophils Absolute: 0.3 10*3/uL (ref 0.0–0.5)
Eosinophils Relative: 5 %
HCT: 26.6 % — ABNORMAL LOW (ref 36.0–46.0)
Hemoglobin: 8.4 g/dL — ABNORMAL LOW (ref 12.0–15.0)
Immature Granulocytes: 1 %
Lymphocytes Relative: 6 %
Lymphs Abs: 0.4 10*3/uL — ABNORMAL LOW (ref 0.7–4.0)
MCH: 22 pg — ABNORMAL LOW (ref 26.0–34.0)
MCHC: 31.6 g/dL (ref 30.0–36.0)
MCV: 69.8 fL — AB (ref 80.0–100.0)
MONO ABS: 0.8 10*3/uL (ref 0.1–1.0)
MONOS PCT: 13 %
Neutro Abs: 4.8 10*3/uL (ref 1.7–7.7)
Neutrophils Relative %: 75 %
Platelet Count: 201 10*3/uL (ref 150–400)
RBC: 3.81 MIL/uL — ABNORMAL LOW (ref 3.87–5.11)
RDW: 17 % — ABNORMAL HIGH (ref 11.5–15.5)
WBC Count: 6.4 10*3/uL (ref 4.0–10.5)
nRBC: 0 % (ref 0.0–0.2)

## 2018-07-07 MED ORDER — SODIUM CHLORIDE 0.9% FLUSH
10.0000 mL | Freq: Once | INTRAVENOUS | Status: AC
  Administered 2018-07-07: 10 mL
  Filled 2018-07-07: qty 10

## 2018-07-07 MED ORDER — HEPARIN SOD (PORK) LOCK FLUSH 100 UNIT/ML IV SOLN
500.0000 [IU] | Freq: Once | INTRAVENOUS | Status: AC
  Administered 2018-07-07: 500 [IU]
  Filled 2018-07-07: qty 5

## 2018-07-10 ENCOUNTER — Inpatient Hospital Stay (HOSPITAL_BASED_OUTPATIENT_CLINIC_OR_DEPARTMENT_OTHER): Payer: 59 | Admitting: Nurse Practitioner

## 2018-07-10 ENCOUNTER — Other Ambulatory Visit: Payer: 59

## 2018-07-10 ENCOUNTER — Ambulatory Visit
Admission: RE | Admit: 2018-07-10 | Discharge: 2018-07-10 | Disposition: A | Discharge status: Home / Self Care | Payer: 59 | Source: Ambulatory Visit | Attending: Radiation Oncology | Admitting: Radiation Oncology

## 2018-07-10 VITALS — BP 108/84 | HR 98 | Temp 98.0°F | Resp 18 | Ht 67.0 in | Wt 124.1 lb

## 2018-07-10 DIAGNOSIS — K1231 Oral mucositis (ulcerative) due to antineoplastic therapy: Secondary | ICD-10-CM | POA: Diagnosis not present

## 2018-07-10 DIAGNOSIS — D649 Anemia, unspecified: Secondary | ICD-10-CM

## 2018-07-10 DIAGNOSIS — C20 Malignant neoplasm of rectum: Secondary | ICD-10-CM

## 2018-07-10 DIAGNOSIS — D569 Thalassemia, unspecified: Secondary | ICD-10-CM | POA: Diagnosis not present

## 2018-07-10 DIAGNOSIS — R300 Dysuria: Secondary | ICD-10-CM

## 2018-07-10 DIAGNOSIS — R197 Diarrhea, unspecified: Secondary | ICD-10-CM

## 2018-07-10 DIAGNOSIS — Z87891 Personal history of nicotine dependence: Secondary | ICD-10-CM

## 2018-07-10 MED ORDER — DIPHENOXYLATE-ATROPINE 2.5-0.025 MG PO TABS: 2.0000 | ORAL_TABLET | Freq: Four times a day (QID) | ORAL | 0 refills | Status: DC | PRN

## 2018-07-10 MED ORDER — MAGIC MOUTHWASH: 5.0000 mL | Freq: Four times a day (QID) | ORAL | 1 refills | Status: DC | PRN

## 2018-07-10 NOTE — Progress Notes (Addendum)
[place] OFFICE PROGRESS NOTE   Diagnosis: Rectal cancer  INTERVAL HISTORY:   Tina Morales returns as scheduled.  She continues Xeloda and radiation.  She denies nausea/vomiting.  No mouth sores.  Left side of her throat has been sore.  She is tolerating fluids without difficulty.  She reports periodic low-grade fever.  She is having diarrhea somewhere between 7-10 loose stools a day.  She is taking Imodium 4 mg multiple times a day with slight improvement.  She is also experiencing dysuria.  No hand or foot pain or redness.  Objective:  Vital signs in last 24 hours:  Blood pressure 108/84, pulse 98, temperature 98 F (36.7 C), temperature source Oral, resp. rate 18, height _0  (1.702 m), weight 124 lb 1.6 oz (56.3 kg), SpO2 96 %.    HEENT: Ulceration at the left lateral posterior tongue.  Area of erythema left posterior palate. Resp: Lungs clear bilaterally. Cardio: Regular rate and rhythm. GI: Abdomen soft and nontender.  No hepatomegaly. Vascular: No leg edema. Skin: Palms without erythema. Port-A-Cath without erythema.   Lab Results:  Lab Results  Component Value Date   WBC 6.4 07/07/2018   HGB 8.4 (L) 07/07/2018   HCT 26.6 (L) 07/07/2018   MCV 69.8 (L) 07/07/2018   PLT 201 07/07/2018   NEUTROABS 4.8 07/07/2018    Imaging:  No results found.  Medications: I have reviewed the patient's current medications.  Assessment/Plan: 1. Rectal cancer-rectal mass noted on digital exam 02/10/2018, colonoscopy confirmed a him my circumferential mass in the rectum ? 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 CTs 02/10/2018-anterior rectal wall thickening, pulmonary nodules measuring up to 9 mm concerning for metastases, few round  perirectal lymph nodes ? Pelvic MRI 02/20/2018-hypermetabolic low rectal mass extending to the posterior vagina with 2 small enlarged perirectal lymph nodes, T4b,N1-2.3 cm from the anal verge ? PET scan 02/23/2018-hypermetabolic rectal mass, 8 mm hypermetabolic lingular nodule, scattered small bilateral lung nodules, some calcified, a few with mildly increased activity ? Cycle 1 FOLFOXIRI8/21/2019 ? Cycle 5 FOLFOXIRI10/16/2019 ? CTs 05/01/2018-significant interval response to therapy with decreased size of the primary rectal mass lesion. Decreasing perirectal lymphadenopathy. Decreased and/or resolved pulmonary nodules. No new sites of disease identified. ? Cycle 6 FOLFOXIRI10/31/2019 ? 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   Diarrhea secondary to Xeloda and radiation 6.   Mucositis secondary to Xeloda   Disposition: Tina Morales appears stable.  She continues radiation and Xeloda.  She is experiencing diarrhea likely due to a combination of Xeloda and radiation.  She is taking Imodium with partial improvement.  A prescription for Lomotil will be sent to her pharmacy.  She also has mucositis.  A prescription for Magic mouthwash will be sent to her pharmacy as well.  We are adjusting the Xeloda dose to 1000 mg twice daily days of radiation.  We will see her in follow-up next week to reevaluate.  She will contact the office in the interim with any problems.  We specifically discussed inability to maintain adequate hydration orally.  Patient seen with Dr. Benay Spice.  25 minutes were spent face-to-face at today's visit with the majority of that time involved in counseling/coordination of care.  Ned Card ANP/GNP-BC   07/10/2018  3:48 PM  This was a shared visit with Ned Card.  Tina Morales and examined.  She is completing  concurrent chemotherapy and radiation for treatment of rectal cancer.  She has developed diarrhea and mucositis.  We adjusted the Xeloda dose and she will return for an office visit next week.  She will undergo a restaging CT evaluation after the completion of chemotherapy and radiation.  Julieanne Manson, MD

## 2018-07-11 ENCOUNTER — Ambulatory Visit
Admission: RE | Admit: 2018-07-11 | Discharge: 2018-07-11 | Disposition: A | Discharge status: Home / Self Care | Payer: 59 | Source: Ambulatory Visit | Attending: Radiation Oncology | Admitting: Radiation Oncology

## 2018-07-11 ENCOUNTER — Telehealth: Payer: Self-pay | Admitting: Nurse Practitioner

## 2018-07-11 DIAGNOSIS — C20 Malignant neoplasm of rectum: Secondary | ICD-10-CM | POA: Diagnosis not present

## 2018-07-11 NOTE — Telephone Encounter (Signed)
Scheduled appt per 12/30 los - pt is aware of appt date and time

## 2018-07-13 ENCOUNTER — Ambulatory Visit
Admission: RE | Admit: 2018-07-13 | Discharge: 2018-07-13 | Disposition: A | Discharge status: Home / Self Care | Payer: 59 | Source: Ambulatory Visit | Attending: Radiation Oncology | Admitting: Radiation Oncology

## 2018-07-13 ENCOUNTER — Telehealth: Payer: Self-pay | Admitting: *Deleted

## 2018-07-13 DIAGNOSIS — Z923 Personal history of irradiation: Secondary | ICD-10-CM | POA: Diagnosis not present

## 2018-07-13 DIAGNOSIS — Z87891 Personal history of nicotine dependence: Secondary | ICD-10-CM | POA: Diagnosis not present

## 2018-07-13 DIAGNOSIS — C20 Malignant neoplasm of rectum: Secondary | ICD-10-CM | POA: Insufficient documentation

## 2018-07-13 DIAGNOSIS — C7982 Secondary malignant neoplasm of genital organs: Secondary | ICD-10-CM | POA: Diagnosis not present

## 2018-07-13 DIAGNOSIS — Z9221 Personal history of antineoplastic chemotherapy: Secondary | ICD-10-CM | POA: Diagnosis not present

## 2018-07-13 NOTE — Telephone Encounter (Signed)
Patient left VM requesting to change her appointment time for the 08/08/18 visit with Ned Card, NP. Scheduling message sent.

## 2018-07-14 ENCOUNTER — Ambulatory Visit
Admission: RE | Admit: 2018-07-14 | Discharge: 2018-07-14 | Disposition: A | Discharge status: Home / Self Care | Payer: 59 | Source: Ambulatory Visit | Attending: Radiation Oncology | Admitting: Radiation Oncology

## 2018-07-14 DIAGNOSIS — C7982 Secondary malignant neoplasm of genital organs: Secondary | ICD-10-CM | POA: Diagnosis not present

## 2018-07-17 ENCOUNTER — Ambulatory Visit: Payer: Self-pay | Admitting: Surgery

## 2018-07-17 ENCOUNTER — Ambulatory Visit
Admission: RE | Admit: 2018-07-17 | Discharge: 2018-07-17 | Disposition: A | Discharge status: Home / Self Care | Payer: 59 | Source: Ambulatory Visit | Attending: Radiation Oncology | Admitting: Radiation Oncology

## 2018-07-17 ENCOUNTER — Telehealth: Payer: Self-pay | Admitting: Oncology

## 2018-07-17 DIAGNOSIS — C7982 Secondary malignant neoplasm of genital organs: Secondary | ICD-10-CM | POA: Diagnosis not present

## 2018-07-17 NOTE — Telephone Encounter (Signed)
Per 1/2 schedule message please call patient re changing time of 1/28 f/u. No availability to change f/u time on 1/28. Left message informing patient and asked that patient call if she would like to reschedule for a different day.

## 2018-07-17 NOTE — H&P (Signed)
CC: Referral by Dr. Benay Spice for rectal cancer - here for f/u  HPI: Ms. Top is a very pleasant 64yoF with hx of anxiety whom presented to digestive health in [place] with what she thought was IVS/Crohn's disease related symptoms-diarrhea and some rectal bleeding. This was in August 2019. She was evaluated and underwent colonoscopy and was found to have a 3.5cm mass in her distal rectum which was biopsied and returned adenocarcinoma at least intramucosal in a background of high-grade dysplasia. The remaining of the colon appeared normal. No loss of mismatch repair protein expression was noted.  She underwent CT scans on 02/10/18 which revealed an eccentric anterior rectal wall thickening and several pulmonary nodules measuring up to 9 mm concerning for metastatic disease. Also had changes consistent with emphysema. There are multiple crowned perirectal lymph nodes measuring up to 6 mm in size. She underwent pelvic MRI 02/20/18 which demonstrated the tumor to be invading the posterior wall of vagina and was read as T4bN1. There is no noted sphincter involvement on that exam. It was measured to be at 2 cm from the anal verge by MRI but this measurement was to the external most portion of the anal verge.  She was treated by medical oncology at [facility] and met with colorectal, Dr. Hilliard Clark - started on FOLFIRI 03/01/18 and completed 5 cycles with them but subsequently decided to transfer care to [facility] where she has been seeing Dr. Benay Spice who completed cycle 6 with her 05/11/18. She has tolerated this therapy reasonably well. She has since been presented at our multidisciplinary tumor Board and the consensus recommendation following additional imaging was that she has exhibited a great response with regards to her metastatic disease to the point that S PRT therapy is no longer even being considered. Given her response, she was referred for surgical evaluation. Current plans are for  her to begin chemoradiation therapy 06/12/18 for 5 1/2 weeks which will put her completion date at the very end of December. She denies any pain or bleeding today. She does have persistent dyspareunia.  INTERVAL HX She has been undergoing neoadjuvant chemoradiotherapy was a low to with planned completion in 2 weeks. She has been tolerating this therapy recently well. She has had some issues with intermittent mouth ulcers but otherwise ok. Her weight has been stable. She denies any nausea or vomiting. She still has anorectal and vaginal pain. She's had no bleeding from her anus or vagina. She brought a list of questions with her to her appointment today and we spent time answering these and going over everything again.   PMH: Anxiety-when necessary alprazolam  PSH: Removal of her adenoids and port placement for chemotherapy  FHx: Denies known FHx of malignancy  Social: Denies use of tobacco/EtOH/drugs. She previously has a history of tobacco abuse but stopped August of this year. She has not had a drink in 5 years. She is currently unemployed  ROS: A comprehensive 10 system review of systems was completed with the patient and pertinent findings as noted above.  The patient is a 53 year old female.   Allergies Alean Rinne, [place]; 07/10/2018 1:51 PM) Sulfa Drugs  Allergies Reconciled   Medication History Alean Rinne, [place]; 07/10/2018 1:53 PM) Xeloda (500MG Tablet, Oral) Active. ALPRAZolam (0.5MG Tablet, Oral) Active. Omeprazole (10MG Capsule DR, Oral) Active. Imodium (2MG Capsule, Oral) Active. Medications Reconciled    Review of Systems Harrell Gave M. Addilee Neu MD; 07/10/2018 2:19 PM) General Present- Fatigue and Weight Loss. Not Present- Appetite Loss, Chills, Fever,  Night Sweats and Weight Gain. Skin Not Present- Change in Wart/Mole, Dryness, Hives, Jaundice, New Lesions, Non-Healing Wounds, Rash and Ulcer. HEENT Present- Seasonal Allergies. Not Present- Earache,  Hearing Loss, Hoarseness, Nose Bleed, Oral Ulcers, Ringing in the Ears, Sinus Pain, Sore Throat, Visual Disturbances, Wears glasses/contact lenses and Yellow Eyes. Respiratory Not Present- Bloody sputum, Chronic Cough, Difficulty Breathing, Snoring and Wheezing. Cardiovascular Not Present- Chest Pain, Difficulty Breathing Lying Down, Leg Cramps, Palpitations, Rapid Heart Rate, Shortness of Breath and Swelling of Extremities. Gastrointestinal Present- Abdominal Pain, Bloody Stool, Indigestion and Rectal Pain. Not Present- Bloating, Change in Bowel Habits, Chronic diarrhea, Constipation, Difficulty Swallowing, Excessive gas, Gets full quickly at meals, Hemorrhoids, Nausea and Vomiting. Female Genitourinary Present- Pelvic Pain. Not Present- Frequency, Nocturia, Painful Urination and Urgency. Musculoskeletal Present- Joint Pain. Not Present- Back Pain, Joint Stiffness, Muscle Pain, Muscle Weakness and Swelling of Extremities. Neurological Present- Headaches. Not Present- Decreased Memory, Fainting, Numbness, Seizures, Tingling, Tremor, Trouble walking and Weakness. Psychiatric Present- Anxiety. Not Present- Bipolar, Change in Sleep Pattern, Depression, Fearful and Frequent crying. Endocrine Present- Cold Intolerance. Not Present- Excessive Hunger, Hair Changes, Heat Intolerance, Hot flashes and New Diabetes. Hematology Not Present- Blood Thinners, Easy Bruising, Excessive bleeding, Gland problems, HIV and Persistent Infections.  Vitals Mardene Celeste King RMA; 07/10/2018 1:51 PM) 07/10/2018 1:51 PM Weight: 124.4 lb Height: 67in Body Surface Area: 1.65 m Body Mass Index: 19.48 kg/m  Temp.: 98.58F  Pulse: 119 (Regular)  BP: 102/68 (Sitting, Left Arm, Standard)       Physical Exam Harrell Gave M. Yaniyah Koors MD; 07/10/2018 2:19 PM) The physical exam findings are as follows: Note:Constitutional: No acute distress; conversant; no deformities Eyes: Moist conjunctiva; no lid lag; anicteric  sclerae; pupils equal round and reactive to light Neck: Trachea midline; no palpable thyromegaly Lungs: Normal respiratory effort; no tactile fremitus CV: Regular rate and rhythm; no palpable thrill; no pitting edema GI: Abdomen soft, nontender, nondistended; no palpable hepatosplenomegaly Anorectal: deferred today due to ongoing tenderness following XRT MSK: Normal gait; no clubbing/cyanosis Psychiatric: Appropriate affect; alert and oriented 3 Lymphatic: No palpable cervical or axillary lymphadenopathy    Assessment & Plan Harrell Gave M. Zephaniah Enyeart MD; 07/10/2018 2:28 PM) RECTAL CANCER (C20) Story: Ms. Kuntzman is a very pleasant 4yoF with hx of anxiety here today for follow-up evaluation of rectal cancer - [id number] - s/p 6 cycles FOLFIRI with great response to the metastatic lesions in her chest to the point where further treatment is being deemed to be unlikely needed and therefore has been referred for surgical evaluation. She is now undergoing neoadjuvant chemotherapy XRT with Xeloda and planned completion in 2 weeks.  -The anatomy and physiology of the GI tract was discussed at length with the patient again today. The pathophysiology of rectal cancer was discussed at length with associated pictures today. -I have discussed her obtaining repeat imaging of the chest/abdomen/pelvis prior to her surgery. I have discussed her case with Dr. Benay Spice as well for coordination of this - given her hx of small possible pulmonary mets -We discussed that should her response continue to be favorable and there is no evidence of metastatic disease, options going forward from a surgical standpoint would be abdominoperineal resection given the location of the tumor in relation to the dentate and her inability to get a clear 1 cm distal margin without removing the anal sphincters. We discussed that this would also involve a posterior vaginectomy. We discussed minimally invasive (robotic and laparoscopic) as well as  possible open techniques. -The planned procedure, material risks (  including, but not limited to, pain, bleeding, infection, scarring, need for blood transfusion, damage to surrounding structures- blood vessels/nerves/viscus/organs, damage to ureter, urine leak, leak from anastomosis, need for additional procedures, vaginal stenosis, hernia, recurrence, pneumonia, heart attack, stroke, death) benefits and alternatives to surgery were discussed at length. I noted a good probability that the procedure would help improve their symptoms. The patient's questions were answered to her satisfaction, she voiced understanding and elected to proceed with surgery. Additionally, we discussed typical postoperative expectations and the recovery process. We discussed that her colostomy would be permanent as well.  Signed electronically by [name], MD (07/10/2018 2:29 PM)

## 2018-07-18 ENCOUNTER — Inpatient Hospital Stay: Payer: 59 | Attending: Oncology

## 2018-07-18 ENCOUNTER — Inpatient Hospital Stay: Payer: 59

## 2018-07-18 ENCOUNTER — Ambulatory Visit
Admission: RE | Admit: 2018-07-18 | Discharge: 2018-07-18 | Disposition: A | Discharge status: Home / Self Care | Payer: 59 | Source: Ambulatory Visit | Attending: Radiation Oncology | Admitting: Radiation Oncology

## 2018-07-18 DIAGNOSIS — Z87891 Personal history of nicotine dependence: Secondary | ICD-10-CM | POA: Insufficient documentation

## 2018-07-18 DIAGNOSIS — Z95828 Presence of other vascular implants and grafts: Secondary | ICD-10-CM

## 2018-07-18 DIAGNOSIS — C20 Malignant neoplasm of rectum: Secondary | ICD-10-CM

## 2018-07-18 DIAGNOSIS — Z9221 Personal history of antineoplastic chemotherapy: Secondary | ICD-10-CM | POA: Insufficient documentation

## 2018-07-18 DIAGNOSIS — Z23 Encounter for immunization: Secondary | ICD-10-CM | POA: Insufficient documentation

## 2018-07-18 DIAGNOSIS — Z510 Encounter for antineoplastic radiation therapy: Secondary | ICD-10-CM | POA: Insufficient documentation

## 2018-07-18 DIAGNOSIS — C7982 Secondary malignant neoplasm of genital organs: Secondary | ICD-10-CM | POA: Insufficient documentation

## 2018-07-18 LAB — CBC WITH DIFFERENTIAL (CANCER CENTER ONLY)
Abs Immature Granulocytes: 0.02 10*3/uL (ref 0.00–0.07)
Basophils Absolute: 0 10*3/uL (ref 0.0–0.1)
Basophils Relative: 0 %
Eosinophils Absolute: 0.1 10*3/uL (ref 0.0–0.5)
Eosinophils Relative: 2 %
HCT: 27 % — ABNORMAL LOW (ref 36.0–46.0)
Hemoglobin: 8.6 g/dL — ABNORMAL LOW (ref 12.0–15.0)
Immature Granulocytes: 0 %
LYMPHS ABS: 0.3 10*3/uL — AB (ref 0.7–4.0)
Lymphocytes Relative: 5 %
MCH: 22.2 pg — AB (ref 26.0–34.0)
MCHC: 31.9 g/dL (ref 30.0–36.0)
MCV: 69.8 fL — ABNORMAL LOW (ref 80.0–100.0)
MONO ABS: 0.5 10*3/uL (ref 0.1–1.0)
Monocytes Relative: 9 %
Neutro Abs: 4.8 10*3/uL (ref 1.7–7.7)
Neutrophils Relative %: 84 %
Platelet Count: 193 10*3/uL (ref 150–400)
RBC: 3.87 MIL/uL (ref 3.87–5.11)
RDW: 18.3 % — ABNORMAL HIGH (ref 11.5–15.5)
WBC Count: 5.8 10*3/uL (ref 4.0–10.5)
nRBC: 0 % (ref 0.0–0.2)

## 2018-07-18 LAB — CMP (CANCER CENTER ONLY)
ALK PHOS: 114 U/L (ref 38–126)
ALT: 12 U/L (ref 0–44)
AST: 18 U/L (ref 15–41)
Albumin: 3.3 g/dL — ABNORMAL LOW (ref 3.5–5.0)
Anion gap: 9 (ref 5–15)
BUN: 9 mg/dL (ref 6–20)
CO2: 27 mmol/L (ref 22–32)
CREATININE: 0.57 mg/dL (ref 0.44–1.00)
Calcium: 8.5 mg/dL — ABNORMAL LOW (ref 8.9–10.3)
Chloride: 102 mmol/L (ref 98–111)
GFR, Est AFR Am: >60 mL/min (ref >60)
GFR, Estimated: >60 mL/min (ref >60)
Glucose, Bld: 105 mg/dL — ABNORMAL HIGH (ref 70–99)
Potassium: 3.7 mmol/L (ref 3.5–5.1)
SODIUM: 138 mmol/L (ref 135–145)
Total Bilirubin: 0.8 mg/dL (ref 0.3–1.2)
Total Protein: 5.7 g/dL — ABNORMAL LOW (ref 6.5–8.1)

## 2018-07-18 MED ORDER — SODIUM CHLORIDE 0.9% FLUSH
10.0000 mL | Freq: Once | INTRAVENOUS | Status: AC
  Administered 2018-07-18: 10 mL
  Filled 2018-07-18: qty 10

## 2018-07-18 MED ORDER — HEPARIN SOD (PORK) LOCK FLUSH 100 UNIT/ML IV SOLN
500.0000 [IU] | Freq: Once | INTRAVENOUS | Status: AC
  Administered 2018-07-18: 500 [IU]
  Filled 2018-07-18: qty 5

## 2018-07-19 ENCOUNTER — Inpatient Hospital Stay: Payer: 59

## 2018-07-19 ENCOUNTER — Inpatient Hospital Stay (HOSPITAL_BASED_OUTPATIENT_CLINIC_OR_DEPARTMENT_OTHER): Payer: 59 | Admitting: Nurse Practitioner

## 2018-07-19 ENCOUNTER — Ambulatory Visit
Admission: RE | Admit: 2018-07-19 | Discharge: 2018-07-19 | Disposition: A | Discharge status: Home / Self Care | Payer: 59 | Source: Ambulatory Visit | Attending: Radiation Oncology | Admitting: Radiation Oncology

## 2018-07-19 ENCOUNTER — Encounter: Payer: Self-pay | Admitting: Nurse Practitioner

## 2018-07-19 VITALS — BP 87/66 | HR 106 | Temp 99.0°F | Resp 18 | Ht 67.0 in | Wt 122.9 lb

## 2018-07-19 VITALS — BP 98/62 | HR 80

## 2018-07-19 DIAGNOSIS — C20 Malignant neoplasm of rectum: Secondary | ICD-10-CM

## 2018-07-19 DIAGNOSIS — Z87891 Personal history of nicotine dependence: Secondary | ICD-10-CM

## 2018-07-19 DIAGNOSIS — D569 Thalassemia, unspecified: Secondary | ICD-10-CM

## 2018-07-19 DIAGNOSIS — R197 Diarrhea, unspecified: Secondary | ICD-10-CM

## 2018-07-19 DIAGNOSIS — D649 Anemia, unspecified: Secondary | ICD-10-CM

## 2018-07-19 DIAGNOSIS — R109 Unspecified abdominal pain: Secondary | ICD-10-CM | POA: Diagnosis not present

## 2018-07-19 DIAGNOSIS — Z95828 Presence of other vascular implants and grafts: Secondary | ICD-10-CM

## 2018-07-19 DIAGNOSIS — C7982 Secondary malignant neoplasm of genital organs: Secondary | ICD-10-CM | POA: Diagnosis not present

## 2018-07-19 MED ORDER — HEPARIN SOD (PORK) LOCK FLUSH 100 UNIT/ML IV SOLN
500.0000 [IU] | Freq: Once | INTRAVENOUS | Status: AC
  Administered 2018-07-19: 500 [IU]
  Filled 2018-07-19: qty 5

## 2018-07-19 MED ORDER — SODIUM CHLORIDE 0.9% FLUSH
10.0000 mL | Freq: Once | INTRAVENOUS | Status: AC
  Administered 2018-07-19: 10 mL
  Filled 2018-07-19: qty 10

## 2018-07-19 MED ORDER — SODIUM CHLORIDE 0.9 % IV SOLN
INTRAVENOUS | Status: DC
  Administered 2018-07-19: 15:00:00 via INTRAVENOUS
  Filled 2018-07-19 (×2): qty 250

## 2018-07-19 MED ORDER — TRAMADOL HCL 50 MG PO TABS: 50.0000 mg | ORAL_TABLET | Freq: Two times a day (BID) | ORAL | 0 refills | Status: DC | PRN

## 2018-07-19 NOTE — Progress Notes (Signed)
[place] OFFICE PROGRESS NOTE   Diagnosis: Rectal cancer  INTERVAL HISTORY:   Ms. Runquist returns as scheduled.  She continues radiation and Xeloda.  The mouth discomfort has resolved.  She continues to have intermittent diarrhea.  She reports low-grade abdominal pain on a fairly consistent basis.  This has been ongoing for some time.  Over the past week she notes periods of increased abdominal pain.  She has intermittent nausea relieved with home antiemetic.  Hands are dry.  No associated pain.  She notes significant discomfort on the skin following urination.  Objective:  Vital signs in last 24 hours:  Blood pressure 99/66, pulse (!) 118, temperature 99 F (37.2 C), temperature source Oral, resp. rate 18, height 5' 7"  (1.702 m), weight 122 lb 14.4 oz (55.7 kg), SpO2 98 %.    HEENT: No thrush or ulcers. Resp: Lungs clear bilaterally. Cardio: Regular, tachycardic. GI: Abdomen is soft, diffuse mild tenderness.  No hepatomegaly. Vascular: No leg edema. Skin: Palms dry appearing.  No erythema.  Perineum is erythematous with scattered areas of superficial ulceration. Port-A-Cath without erythema.  Lab Results:  Lab Results  Component Value Date   WBC 5.8 07/18/2018   HGB 8.6 (L) 07/18/2018   HCT 27.0 (L) 07/18/2018   MCV 69.8 (L) 07/18/2018   PLT 193 07/18/2018   NEUTROABS 4.8 07/18/2018    Imaging:  No results found.  Medications: I have reviewed the patient's current medications.  Assessment/Plan: 1. Rectal cancer-rectal mass noted on digital exam 02/10/2018, colonoscopy confirmed a him my circumferential mass in the rectum ? 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 CTs 02/10/2018-anterior rectal wall thickening, pulmonary  nodules measuring up to 9 mm concerning for metastases, few round perirectal lymph nodes ? Pelvic MRI 02/20/2018-hypermetabolic low rectal mass extending to the posterior vagina with 2 small enlarged perirectal lymph nodes, T4b,N1-2.3 cm from the anal verge ? PET scan 02/23/2018-hypermetabolic rectal mass, 8 mm hypermetabolic lingular nodule, scattered small bilateral lung nodules, some calcified, a few with mildly increased activity ? Cycle 1 FOLFOXIRI8/21/2019 ? Cycle 5 FOLFOXIRI10/16/2019 ? CTs 05/01/2018-significant interval response to therapy with decreased size of the primary rectal mass lesion. Decreasing perirectal lymphadenopathy. Decreased and/or resolved pulmonary nodules. No new sites of disease identified. ? Cycle 6 FOLFOXIRI10/31/2019 ? Radiation/Xeloda 06/12/2018 ? 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   Diarrhea secondary to Xeloda and radiation.  Imodium as needed. 6.   Mucositis secondary to Xeloda.  Improved 07/19/2018.    Disposition: Ms. Sammarco appears stable.  She is scheduled to complete the course of Xeloda/radiation 07/21/2018.  We dose reduced the Xeloda last week due to mucositis and diarrhea.  The mucositis is better.  She continues to have intermittent loose stools.  She is likely mildly dehydrated.  We are giving her IV fluids in the office today.   For the abdominal discomfort she will try Tylenol for mild pain and tramadol 50 mg every 12 hours for moderate pain.  She will contact the office if this is not effective.  She is scheduled for restaging CTs 08/07/2018.  She will return for follow-up here 08/08/2018.  She will contact the office in the interim  as outlined above or with any other problems.  I reviewed the above with Dr. Burr Medico.     Ned Card  ANP/GNP-BC   07/19/2018  2:47 PM

## 2018-07-19 NOTE — Progress Notes (Signed)
No blood return from port, flushes easily, drips fast to gravity, no pain. OK per Ned Card, NP to run IVF

## 2018-07-20 ENCOUNTER — Telehealth: Payer: Self-pay | Admitting: *Deleted

## 2018-07-20 ENCOUNTER — Ambulatory Visit
Admission: RE | Admit: 2018-07-20 | Discharge: 2018-07-20 | Disposition: A | Discharge status: Home / Self Care | Payer: 59 | Source: Ambulatory Visit | Attending: Radiation Oncology | Admitting: Radiation Oncology

## 2018-07-20 ENCOUNTER — Encounter: Payer: Self-pay | Admitting: Radiation Oncology

## 2018-07-20 DIAGNOSIS — C7982 Secondary malignant neoplasm of genital organs: Secondary | ICD-10-CM | POA: Diagnosis not present

## 2018-07-20 NOTE — Progress Notes (Signed)
The patient was seen today and has 1 fraction to complete her course.  She has been experiencing abdominal fullness and discomfort that is described as sharp and shooting and can double her over in pain.  It relieves itself without any intervention after several minutes to hours.  She is tried taking Tylenol and Ultram without improvement.  She states that she is feeling much better after having some IV fluids yesterday.  She states her bowels alternate between formed stool and diarrhea.  She reports that she has had this discomfort with her skin as she urinates but denies any shooting pains or pain while urinating.  She discusses that she does not feel that this is internal.  No other complaints are verbalized.  Her vitals are reviewed.  She does have a temperature of 99 degrees.  She has had previous temperatures earlier this week that were in the 100 range.  I offered a UA though she declines, I think that her symptoms are more related to skin irritation and discomfort from the urine hitting the irritated skin, and a perineal bottle was provided to her so she can use this as she urinates today so the urine is less concentrated against her skin.  I also encouraged her to try over-the-counter Gas-X 3 times daily to see if this would help her symptoms.  On examination she has bowel sounds that are active in all quadrants, but her right upper quadrant is tympanic to percussion.  She will contact us if her symptoms do not improve.  She will complete treatment tomorrow as well.  She is scheduled to undergo surgical excision with Dr. Dema Severin on 09/01/2018.  I will follow-up with her by phone in the next few weeks to see how she is doing.

## 2018-07-20 NOTE — Telephone Encounter (Signed)
Received TC from patient. She states she continues to have temp. Today it is 100.5. she is still feeling very fatigued/weak. She is scheduled to come in for her radiation treatment @ 3pm.  She wanted to know if she still should come in. I advised her to come in for her treatment and to have VS re-checked. She did say she took Tylenol a few minutes ago.  Taking in fluids fairly well. She received IVF yesterday after her appt with Ned Card, NP

## 2018-07-21 ENCOUNTER — Encounter: Payer: Self-pay | Admitting: Radiation Oncology

## 2018-07-21 ENCOUNTER — Ambulatory Visit
Admission: RE | Admit: 2018-07-21 | Discharge: 2018-07-21 | Disposition: A | Discharge status: Home / Self Care | Payer: 59 | Source: Ambulatory Visit | Attending: Radiation Oncology | Admitting: Radiation Oncology

## 2018-07-21 DIAGNOSIS — C7982 Secondary malignant neoplasm of genital organs: Secondary | ICD-10-CM | POA: Diagnosis not present

## 2018-07-27 NOTE — Progress Notes (Signed)
  Radiation Oncology         [phone] ________________________________  Name: Tina Morales MRN: [id number]  Date: 07/21/2018  DOB: March 25, 1966  End of Treatment Note  Diagnosis:   53 y.o. 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  Beams/energy:   3D / 15X, 6X Photon  Narrative: The patient tolerated radiation treatment relatively well.  She experienced moderate fatigue and some rectal irritation with frequent diarrhea, taking Imodium and Lomotil to manage. She was recommended Neosporin Plus for the perineum where skin has broken down. She also reported moderate dysuria and intermittent sharp pain in her lower abdomen. We have discussed beginning simtheicone, and she knows to contact us or go to ER if this pain worsens.   Plan: The patient has completed radiation treatment. The patient will return to radiation oncology clinic for routine followup in one month. I advised them to call or return sooner if they have any questions or concerns related to their recovery or treatment.  ------------------------------------------------  Jodelle Gross, MD, PhD  This document serves as a record of services personally performed by Kyung Rudd, MD. It was created on his behalf by Rae Lips, a trained medical scribe. The creation of this record is based on the scribe's personal observations and the provider's statements to them. This document has been checked and approved by the attending provider.

## 2018-07-28 ENCOUNTER — Other Ambulatory Visit: Payer: Self-pay | Admitting: *Deleted

## 2018-07-28 DIAGNOSIS — C20 Malignant neoplasm of rectum: Secondary | ICD-10-CM

## 2018-07-28 MED ORDER — ALPRAZOLAM 0.5 MG PO TABS: 0.5000 mg | ORAL_TABLET | Freq: Two times a day (BID) | ORAL | 0 refills | Status: DC | PRN

## 2018-07-28 NOTE — Progress Notes (Signed)
Patient called to report she was told we had denied her alprazolam. Informed her I see no record of this request. Called in refill to pharmacy automated line per OK of Dr.Sherrill.

## 2018-08-01 NOTE — Progress Notes (Signed)
Added date to history.

## 2018-08-01 NOTE — Progress Notes (Signed)
Updated history per office visit with Dr Dema Severin.

## 2018-08-04 ENCOUNTER — Inpatient Hospital Stay (HOSPITAL_BASED_OUTPATIENT_CLINIC_OR_DEPARTMENT_OTHER): Payer: 59 | Admitting: Gynecologic Oncology

## 2018-08-04 ENCOUNTER — Encounter: Payer: Self-pay | Admitting: Gynecologic Oncology

## 2018-08-04 ENCOUNTER — Telehealth: Payer: Self-pay | Admitting: *Deleted

## 2018-08-04 VITALS — BP 115/83 | HR 83 | Temp 98.9°F | Resp 20 | Ht 67.5 in | Wt 125.4 lb

## 2018-08-04 DIAGNOSIS — Z87891 Personal history of nicotine dependence: Secondary | ICD-10-CM

## 2018-08-04 DIAGNOSIS — Z923 Personal history of irradiation: Secondary | ICD-10-CM | POA: Diagnosis not present

## 2018-08-04 DIAGNOSIS — Z9221 Personal history of antineoplastic chemotherapy: Secondary | ICD-10-CM | POA: Diagnosis not present

## 2018-08-04 DIAGNOSIS — C20 Malignant neoplasm of rectum: Secondary | ICD-10-CM | POA: Diagnosis not present

## 2018-08-04 DIAGNOSIS — C7982 Secondary malignant neoplasm of genital organs: Secondary | ICD-10-CM | POA: Diagnosis not present

## 2018-08-04 NOTE — Progress Notes (Signed)
Consult Note: Gyn-Onc  Consult was requested by Dr. Dema Severin for the evaluation of Tina Morales 53 y.o. female  CC:  Chief Complaint  Patient presents with  . Rectal Cancer    Assessment/Plan:  Tina Morales  is a 53 y.o.  year old with locally advanced rectal cancer with posterior vaginal wall involvement.   I discussed with Tina Morales that we would likely be able to resect the posterior distal vagina and spared uterus and cervix, however if the evaluation of the anatomy intraperitoneally reveals that in order to achieve adequate resection margins the cervix and this needs to be removed, we will perform a total hysterectomy the same time.  I explained that resection of the posterior distal the vagina will result in vaginal narrowing and likely some sexual dysfunction.  I discussed that postoperatively we can counsel her on dilator use, with the potential for physical therapy, or even consideration for later plastic surgery reconstruction if this becomes necessary.  The surgery will be performed robotically.  I will be available throughout the morning of February 21 to assist Dr. Dema Severin with the necessary portions of the procedure.   HPI: Tina Morales is a 53 year old P0 who is seen in consultation at the request of Dr Dema Severin for locally advanced rectal cancer with vaginal involvement.  The patient began experiencing some discomfort with intercourse and GI discomfort in August 2019.  She underwent a pelvic examination with a transvaginal ultrasound scan and this was unremarkable, therefore was referred for colonoscopy.  Colonoscopy on 02/10/2018 revealed a circumferential mass in the rectum with biopsy confirming a tubular adenoma with at least high-grade dysplasia.  Review of the specimen with a digestive health specialist confirmed intramucosal adenocarcinoma.  CT scan on 02/10/2018 revealed anterior rectal wall thickening, pulmonary nodules measuring up to 9 mm concerning for metastases, and a few  round perirectal lymph nodes.  Pelvic MRI on 02/20/2018 revealed hypermetabolic low rectal mass extending from the posterior vagina with 2 small and large perirectal lymph nodes.  A PET scan on 02/23/2018 confirmed and hypermetabolic rectal mass, small scattered bilateral lung nodules, a few with mildly increased activity.  She underwent chemotherapy with 6 cycles of FOLFOXIRI between 03/01/2018 and 05/11/2018.  She received radiation with Xeloda until 07/21/18.  Based on repeat imaging she had good response to therapy and was recommended to be considered for APR with Dr Annye English on 09/01/18.  The patient is otherwise very healthy.  She has never been pregnant.  She is postmenopausal prior to radiation.  She is never had an abnormal Pap smear.  Current Meds:  Outpatient Encounter Medications as of 08/04/2018  Medication Sig  . acetaminophen (TYLENOL) 500 MG tablet Take by mouth as needed.   . ALPRAZolam (XANAX) 0.5 MG tablet Take 1 tablet (0.5 mg total) by mouth 2 (two) times daily as needed.  . Calcium Polycarbophil (FIBER-CAPS PO) Take 2 capsules by mouth as needed.   . cyclobenzaprine (FLEXERIL) 5 MG tablet Take 5 mg by mouth as needed.   . diphenoxylate-atropine (LOMOTIL) 2.5-0.025 MG tablet Take 2 tablets by mouth 4 (four) times daily as needed for diarrhea or loose stools.  . ferrous sulfate 325 (65 FE) MG EC tablet Take 1 tablet (325 mg total) by mouth 2 (two) times daily with a meal.  . lidocaine-prilocaine (EMLA) cream Apply 1 application topically as needed.  . loperamide (IMODIUM) 2 MG capsule Take by mouth as needed for diarrhea or loose stools.  . magic  mouthwash SOLN Take 5-10 mLs by mouth 4 (four) times daily as needed for mouth pain.  Marland Kitchen omeprazole (PRILOSEC) 10 MG capsule Take 1 capsule (10 mg total) by mouth daily.  . ondansetron (ZOFRAN) 8 MG tablet Take 1 tablet (8 mg total) by mouth every 12 (twelve) hours as needed for nausea or vomiting.  . phenazopyridine (PYRIDIUM) 97 MG  tablet Take 97 mg by mouth 3 (three) times daily as needed for pain.  . polyethylene glycol powder (GLYCOLAX/MIRALAX) powder Take by mouth as needed.   . prochlorperazine (COMPAZINE) 10 MG tablet Take 1 tablet (10 mg total) by mouth every 6 (six) hours as needed for nausea or vomiting.  . traMADol (ULTRAM) 50 MG tablet Take 1 tablet (50 mg total) by mouth every 12 (twelve) hours as needed.  . [DISCONTINUED] capecitabine (XELODA) 500 MG tablet Take 3 tablets (1500mg ) by mouth in AM & 2 tabs (1000mg ) in PM, immediately after food, taken on days of radiation only, M-F (Patient not taking: Reported on 08/04/2018)   No facility-administered encounter medications on file as of 08/04/2018.     Allergy:  Allergies  Allergen Reactions  . Sulfa Antibiotics     Patient unaware of side effects, she was told when she was a child that she was allergic    Social Hx:   Social History   Socioeconomic History  . Marital status: Married    Spouse name: Not on file  . Number of children: Not on file  . Years of education: Not on file  . Highest education level: Not on file  Occupational History  . Not on file  Social Needs  . Financial resource strain: Not on file  . Food insecurity:    Worry: Not on file    Inability: Not on file  . Transportation needs:    Medical: No    Non-medical: No  Tobacco Use  . Smoking status: Former Smoker    Last attempt to quit: 02/2018    Years since quitting: 0.4  . Smokeless tobacco: Never Used  Substance and Sexual Activity  . Alcohol use: Not Currently    Frequency: Never    Comment: per Dr Dema Severin chart, "no drink in the last 5 years"  . Drug use: Never  . Sexual activity: Not on file  Lifestyle  . Physical activity:    Days per week: Not on file    Minutes per session: Not on file  . Stress: Not on file  Relationships  . Social connections:    Talks on phone: Not on file    Gets together: Not on file    Attends religious service: Not on file     Active member of club or organization: Not on file    Attends meetings of clubs or organizations: Not on file    Relationship status: Not on file  . Intimate partner violence:    Fear of current or ex partner: Not on file    Emotionally abused: Not on file    Physically abused: Not on file    Forced sexual activity: Not on file  Other Topics Concern  . Not on file  Social History Narrative  . Not on file    Past Surgical Hx:  Past Surgical History:  Procedure Laterality Date  . ADENOIDECTOMY    . TONSILLECTOMY      Past Medical Hx:  Past Medical History:  Diagnosis Date  . Adenocarcinoma ([place]) 02/2018  . Anxiety   . Cancer ([place])  02/10/2018   Stage 4 Colorectal Cancer, spot on her lungs  . Migraine     Past Gynecological History:  See HPI No LMP recorded.  Family Hx:  Family History  Problem Relation Age of Onset  . Breast cancer Maternal Aunt   . Throat cancer Cousin   . Cancer Cousin   . Throat cancer Cousin   . Lung cancer Paternal Uncle     Review of Systems:  Constitutional  Feels well,    ENT Normal appearing ears and nares bilaterally Skin/Breast  No rash, sores, jaundice, itching, dryness Cardiovascular  No chest pain, shortness of breath, or edema  Pulmonary  No cough or wheeze.  Gastro Intestinal  No nausea, vomitting, or diarrhoea. No bright red blood per rectum, no abdominal pain, change in bowel movement, or constipation.  Genito Urinary  No frequency, urgency, dysuria, no bleeding Musculo Skeletal  No myalgia, arthralgia, joint swelling or pain  Neurologic  No weakness, numbness, change in gait,  Psychology  No depression, anxiety, insomnia.   Vitals:  Blood pressure 115/83, pulse 83, temperature 98.9 F (37.2 C), temperature source Oral, resp. rate 20, height 5' 7.5" (1.715 m), weight 125 lb 6.4 oz (56.9 kg), SpO2 98 %.  Physical Exam: WD in NAD Neck  Supple NROM, without any enlargements.  Lymph Node Survey No cervical  supraclavicular or inguinal adenopathy Cardiovascular  Pulse normal rate, regularity and rhythm. S1 and S2 normal.  Lungs  Clear to auscultation bilateraly, without wheezes/crackles/rhonchi. Good air movement.  Skin  No rash/lesions/breakdown  Psychiatry  Alert and oriented to person, place, and time  Abdomen  Normoactive bowel sounds, abdomen soft, non-tender and thin without evidence of hernia.  Back No CVA tenderness Genito Urinary  Vulva/vagina: Normal external female genitalia.  No lesions. No discharge or bleeding.  Bladder/urethra:  No lesions or masses, well supported bladder  Vagina: narrow, atrophic, some radiation changes present. No visible tumor within vaginal lumen or on mucosal surface.  There is a palpable 3 cm mass that is firm but mobile involving the rectum and rectovaginal septum.  It involves the distal third of the vagina and encroaches upon the levator plate.  It is palpably remote from the cervix  Cervix: Normal appearing, no lesions.  Uterus:  Small, mobile, no parametrial involvement or nodularity.  Adnexa: no discrete masses. Rectal : deferred due to dicomfort. Extremities  No bilateral cyanosis, clubbing or edema.   Thereasa Solo, MD  08/04/2018, 1:00 PM

## 2018-08-04 NOTE — Patient Instructions (Addendum)
Dr Denman George is planning to remove the back wall of the vagina during your procedure with Dr Dema Severin. The procedure will be performed through small incisions (robotically).  She may need to remove the uterus, tubes and ovaries during the procedure if this establishes better margins around the cancer.

## 2018-08-04 NOTE — H&P (View-Only) (Signed)
Consult Note: Gyn-Onc  Consult was requested by Dr. Dema Severin for the evaluation of Tina Morales 53 y.o. female  CC:  Chief Complaint  Patient presents with  . Rectal Cancer    Assessment/Plan:  Tina Morales  is a 53 y.o.  year old with locally advanced rectal cancer with posterior vaginal wall involvement.   I discussed with Tina Morales that we would likely be able to resect the posterior distal vagina and spared uterus and cervix, however if the evaluation of the anatomy intraperitoneally reveals that in order to achieve adequate resection margins the cervix and this needs to be removed, we will perform a total hysterectomy the same time.  I explained that resection of the posterior distal the vagina will result in vaginal narrowing and likely some sexual dysfunction.  I discussed that postoperatively we can counsel her on dilator use, with the potential for physical therapy, or even consideration for later plastic surgery reconstruction if this becomes necessary.  The surgery will be performed robotically.  I will be available throughout the morning of February 21 to assist Dr. Dema Severin with the necessary portions of the procedure.   HPI: Tina Morales is a 53 year old P0 who is seen in consultation at the request of Dr Dema Severin for locally advanced rectal cancer with vaginal involvement.  The patient began experiencing some discomfort with intercourse and GI discomfort in August 2019.  She underwent a pelvic examination with a transvaginal ultrasound scan and this was unremarkable, therefore was referred for colonoscopy.  Colonoscopy on 02/10/2018 revealed a circumferential mass in the rectum with biopsy confirming a tubular adenoma with at least high-grade dysplasia.  Review of the specimen with a digestive health specialist confirmed intramucosal adenocarcinoma.  CT scan on 02/10/2018 revealed anterior rectal wall thickening, pulmonary nodules measuring up to 9 mm concerning for metastases, and a few  round perirectal lymph nodes.  Pelvic MRI on 02/20/2018 revealed hypermetabolic low rectal mass extending from the posterior vagina with 2 small and large perirectal lymph nodes.  A PET scan on 02/23/2018 confirmed and hypermetabolic rectal mass, small scattered bilateral lung nodules, a few with mildly increased activity.  She underwent chemotherapy with 6 cycles of FOLFOXIRI between 03/01/2018 and 05/11/2018.  She received radiation with Xeloda until 07/21/18.  Based on repeat imaging she had good response to therapy and was recommended to be considered for APR with Dr Annye English on 09/01/18.  The patient is otherwise very healthy.  She has never been pregnant.  She is postmenopausal prior to radiation.  She is never had an abnormal Pap smear.  Current Meds:  Outpatient Encounter Medications as of 08/04/2018  Medication Sig  . acetaminophen (TYLENOL) 500 MG tablet Take by mouth as needed.   . ALPRAZolam (XANAX) 0.5 MG tablet Take 1 tablet (0.5 mg total) by mouth 2 (two) times daily as needed.  . Calcium Polycarbophil (FIBER-CAPS PO) Take 2 capsules by mouth as needed.   . cyclobenzaprine (FLEXERIL) 5 MG tablet Take 5 mg by mouth as needed.   . diphenoxylate-atropine (LOMOTIL) 2.5-0.025 MG tablet Take 2 tablets by mouth 4 (four) times daily as needed for diarrhea or loose stools.  . ferrous sulfate 325 (65 FE) MG EC tablet Take 1 tablet (325 mg total) by mouth 2 (two) times daily with a meal.  . lidocaine-prilocaine (EMLA) cream Apply 1 application topically as needed.  . loperamide (IMODIUM) 2 MG capsule Take by mouth as needed for diarrhea or loose stools.  . magic  mouthwash SOLN Take 5-10 mLs by mouth 4 (four) times daily as needed for mouth pain.  Marland Kitchen omeprazole (PRILOSEC) 10 MG capsule Take 1 capsule (10 mg total) by mouth daily.  . ondansetron (ZOFRAN) 8 MG tablet Take 1 tablet (8 mg total) by mouth every 12 (twelve) hours as needed for nausea or vomiting.  . phenazopyridine (PYRIDIUM) 97 MG  tablet Take 97 mg by mouth 3 (three) times daily as needed for pain.  . polyethylene glycol powder (GLYCOLAX/MIRALAX) powder Take by mouth as needed.   . prochlorperazine (COMPAZINE) 10 MG tablet Take 1 tablet (10 mg total) by mouth every 6 (six) hours as needed for nausea or vomiting.  . traMADol (ULTRAM) 50 MG tablet Take 1 tablet (50 mg total) by mouth every 12 (twelve) hours as needed.  . [DISCONTINUED] capecitabine (XELODA) 500 MG tablet Take 3 tablets (1500mg ) by mouth in AM & 2 tabs (1000mg ) in PM, immediately after food, taken on days of radiation only, M-F (Patient not taking: Reported on 08/04/2018)   No facility-administered encounter medications on file as of 08/04/2018.     Allergy:  Allergies  Allergen Reactions  . Sulfa Antibiotics     Patient unaware of side effects, she was told when she was a child that she was allergic    Social Hx:   Social History   Socioeconomic History  . Marital status: Married    Spouse name: Not on file  . Number of children: Not on file  . Years of education: Not on file  . Highest education level: Not on file  Occupational History  . Not on file  Social Needs  . Financial resource strain: Not on file  . Food insecurity:    Worry: Not on file    Inability: Not on file  . Transportation needs:    Medical: No    Non-medical: No  Tobacco Use  . Smoking status: Former Smoker    Last attempt to quit: 02/2018    Years since quitting: 0.4  . Smokeless tobacco: Never Used  Substance and Sexual Activity  . Alcohol use: Not Currently    Frequency: Never    Comment: per Dr Dema Severin chart, "no drink in the last 5 years"  . Drug use: Never  . Sexual activity: Not on file  Lifestyle  . Physical activity:    Days per week: Not on file    Minutes per session: Not on file  . Stress: Not on file  Relationships  . Social connections:    Talks on phone: Not on file    Gets together: Not on file    Attends religious service: Not on file     Active member of club or organization: Not on file    Attends meetings of clubs or organizations: Not on file    Relationship status: Not on file  . Intimate partner violence:    Fear of current or ex partner: Not on file    Emotionally abused: Not on file    Physically abused: Not on file    Forced sexual activity: Not on file  Other Topics Concern  . Not on file  Social History Narrative  . Not on file    Past Surgical Hx:  Past Surgical History:  Procedure Laterality Date  . ADENOIDECTOMY    . TONSILLECTOMY      Past Medical Hx:  Past Medical History:  Diagnosis Date  . Adenocarcinoma ([place]) 02/2018  . Anxiety   . Cancer ([place])  02/10/2018   Stage 4 Colorectal Cancer, spot on her lungs  . Migraine     Past Gynecological History:  See HPI No LMP recorded.  Family Hx:  Family History  Problem Relation Age of Onset  . Breast cancer Maternal Aunt   . Throat cancer Cousin   . Cancer Cousin   . Throat cancer Cousin   . Lung cancer Paternal Uncle     Review of Systems:  Constitutional  Feels well,    ENT Normal appearing ears and nares bilaterally Skin/Breast  No rash, sores, jaundice, itching, dryness Cardiovascular  No chest pain, shortness of breath, or edema  Pulmonary  No cough or wheeze.  Gastro Intestinal  No nausea, vomitting, or diarrhoea. No bright red blood per rectum, no abdominal pain, change in bowel movement, or constipation.  Genito Urinary  No frequency, urgency, dysuria, no bleeding Musculo Skeletal  No myalgia, arthralgia, joint swelling or pain  Neurologic  No weakness, numbness, change in gait,  Psychology  No depression, anxiety, insomnia.   Vitals:  Blood pressure 115/83, pulse 83, temperature 98.9 F (37.2 C), temperature source Oral, resp. rate 20, height 5' 7.5" (1.715 m), weight 125 lb 6.4 oz (56.9 kg), SpO2 98 %.  Physical Exam: WD in NAD Neck  Supple NROM, without any enlargements.  Lymph Node Survey No cervical  supraclavicular or inguinal adenopathy Cardiovascular  Pulse normal rate, regularity and rhythm. S1 and S2 normal.  Lungs  Clear to auscultation bilateraly, without wheezes/crackles/rhonchi. Good air movement.  Skin  No rash/lesions/breakdown  Psychiatry  Alert and oriented to person, place, and time  Abdomen  Normoactive bowel sounds, abdomen soft, non-tender and thin without evidence of hernia.  Back No CVA tenderness Genito Urinary  Vulva/vagina: Normal external female genitalia.  No lesions. No discharge or bleeding.  Bladder/urethra:  No lesions or masses, well supported bladder  Vagina: narrow, atrophic, some radiation changes present. No visible tumor within vaginal lumen or on mucosal surface.  There is a palpable 3 cm mass that is firm but mobile involving the rectum and rectovaginal septum.  It involves the distal third of the vagina and encroaches upon the levator plate.  It is palpably remote from the cervix  Cervix: Normal appearing, no lesions.  Uterus:  Small, mobile, no parametrial involvement or nodularity.  Adnexa: no discrete masses. Rectal : deferred due to dicomfort. Extremities  No bilateral cyanosis, clubbing or edema.   Thereasa Solo, MD  08/04/2018, 1:00 PM

## 2018-08-04 NOTE — Telephone Encounter (Signed)
Open by mistake

## 2018-08-07 ENCOUNTER — Inpatient Hospital Stay: Payer: 59

## 2018-08-07 ENCOUNTER — Other Ambulatory Visit: Payer: Self-pay | Admitting: Urology

## 2018-08-07 ENCOUNTER — Ambulatory Visit (HOSPITAL_COMMUNITY)
Admission: RE | Admit: 2018-08-07 | Discharge: 2018-08-07 | Disposition: A | Discharge status: Home / Self Care | Payer: 59 | Source: Ambulatory Visit | Attending: Nurse Practitioner | Admitting: Nurse Practitioner

## 2018-08-07 DIAGNOSIS — C20 Malignant neoplasm of rectum: Secondary | ICD-10-CM

## 2018-08-07 DIAGNOSIS — C7982 Secondary malignant neoplasm of genital organs: Secondary | ICD-10-CM | POA: Diagnosis not present

## 2018-08-07 DIAGNOSIS — Z95828 Presence of other vascular implants and grafts: Secondary | ICD-10-CM

## 2018-08-07 LAB — CBC WITH DIFFERENTIAL (CANCER CENTER ONLY)
Abs Immature Granulocytes: 0.04 10*3/uL (ref 0.00–0.07)
Basophils Absolute: 0 10*3/uL (ref 0.0–0.1)
Basophils Relative: 0 %
Eosinophils Absolute: 0.1 10*3/uL (ref 0.0–0.5)
Eosinophils Relative: 2 %
HCT: 29.5 % — ABNORMAL LOW (ref 36.0–46.0)
Hemoglobin: 9 g/dL — ABNORMAL LOW (ref 12.0–15.0)
Immature Granulocytes: 1 %
Lymphocytes Relative: 14 %
Lymphs Abs: 0.6 10*3/uL — ABNORMAL LOW (ref 0.7–4.0)
MCH: 21.8 pg — ABNORMAL LOW (ref 26.0–34.0)
MCHC: 30.5 g/dL (ref 30.0–36.0)
MCV: 71.4 fL — ABNORMAL LOW (ref 80.0–100.0)
Monocytes Absolute: 0.5 10*3/uL (ref 0.1–1.0)
Monocytes Relative: 11 %
NEUTROS ABS: 3 10*3/uL (ref 1.7–7.7)
Neutrophils Relative %: 72 %
Platelet Count: 304 10*3/uL (ref 150–400)
RBC: 4.13 MIL/uL (ref 3.87–5.11)
RDW: 18.2 % — ABNORMAL HIGH (ref 11.5–15.5)
WBC Count: 4.2 10*3/uL (ref 4.0–10.5)
nRBC: 0.9 % — ABNORMAL HIGH (ref 0.0–0.2)

## 2018-08-07 LAB — CMP (CANCER CENTER ONLY)
ALT: 9 U/L (ref 0–44)
AST: 18 U/L (ref 15–41)
Albumin: 3.8 g/dL (ref 3.5–5.0)
Alkaline Phosphatase: 106 U/L (ref 38–126)
Anion gap: 7 (ref 5–15)
BUN: 7 mg/dL (ref 6–20)
CALCIUM: 9.5 mg/dL (ref 8.9–10.3)
CO2: 31 mmol/L (ref 22–32)
Chloride: 102 mmol/L (ref 98–111)
Creatinine: 0.6 mg/dL (ref 0.44–1.00)
GFR, Estimated: >60 mL/min (ref >60)
Glucose, Bld: 89 mg/dL (ref 70–99)
Potassium: 4 mmol/L (ref 3.5–5.1)
Sodium: 140 mmol/L (ref 135–145)
Total Bilirubin: 0.8 mg/dL (ref 0.3–1.2)
Total Protein: 6.5 g/dL (ref 6.5–8.1)

## 2018-08-07 MED ORDER — IOHEXOL 300 MG/ML  SOLN
100.0000 mL | Freq: Once | INTRAMUSCULAR | Status: AC | PRN
  Administered 2018-08-07: 100 mL via INTRAVENOUS

## 2018-08-07 MED ORDER — SODIUM CHLORIDE (PF) 0.9 % IJ SOLN
INTRAMUSCULAR | Status: AC
  Filled 2018-08-07: qty 50

## 2018-08-07 MED ORDER — SODIUM CHLORIDE 0.9% FLUSH
10.0000 mL | Freq: Once | INTRAVENOUS | Status: AC
  Administered 2018-08-07: 10 mL
  Filled 2018-08-07: qty 10

## 2018-08-07 MED ORDER — HEPARIN SOD (PORK) LOCK FLUSH 100 UNIT/ML IV SOLN
INTRAVENOUS | Status: AC
  Filled 2018-08-07: qty 5

## 2018-08-08 ENCOUNTER — Inpatient Hospital Stay (HOSPITAL_BASED_OUTPATIENT_CLINIC_OR_DEPARTMENT_OTHER): Payer: 59 | Admitting: Oncology

## 2018-08-08 ENCOUNTER — Telehealth: Payer: Self-pay

## 2018-08-08 VITALS — BP 105/71 | HR 85 | Temp 98.6°F | Resp 18 | Ht 67.75 in | Wt 126.3 lb

## 2018-08-08 DIAGNOSIS — K1231 Oral mucositis (ulcerative) due to antineoplastic therapy: Secondary | ICD-10-CM

## 2018-08-08 DIAGNOSIS — R918 Other nonspecific abnormal finding of lung field: Secondary | ICD-10-CM | POA: Diagnosis not present

## 2018-08-08 DIAGNOSIS — D569 Thalassemia, unspecified: Secondary | ICD-10-CM | POA: Diagnosis not present

## 2018-08-08 DIAGNOSIS — C20 Malignant neoplasm of rectum: Secondary | ICD-10-CM | POA: Diagnosis not present

## 2018-08-08 DIAGNOSIS — C7982 Secondary malignant neoplasm of genital organs: Secondary | ICD-10-CM | POA: Diagnosis not present

## 2018-08-08 DIAGNOSIS — Z87891 Personal history of nicotine dependence: Secondary | ICD-10-CM

## 2018-08-08 DIAGNOSIS — D649 Anemia, unspecified: Secondary | ICD-10-CM | POA: Diagnosis not present

## 2018-08-08 DIAGNOSIS — Z23 Encounter for immunization: Secondary | ICD-10-CM

## 2018-08-08 LAB — CEA (IN HOUSE-CHCC): CEA (CHCC-In House): 3.09 ng/mL (ref 0.00–5.00)

## 2018-08-08 MED ORDER — INFLUENZA VAC SPLIT QUAD 0.5 ML IM SUSY
PREFILLED_SYRINGE | INTRAMUSCULAR | Status: AC
  Filled 2018-08-08: qty 0.5

## 2018-08-08 MED ORDER — INFLUENZA VAC SPLIT QUAD 0.5 ML IM SUSY
0.5000 mL | PREFILLED_SYRINGE | Freq: Once | INTRAMUSCULAR | Status: AC
  Administered 2018-08-08: 0.5 mL via INTRAMUSCULAR

## 2018-08-08 NOTE — Progress Notes (Signed)
Discussed having flu vaccine today. Patient unsure if she wants the vaccine. Has never had it in the past. Will discuss w/MD today. After discussion with Dr. Benay Spice, patient agreed to flu vaccine.

## 2018-08-08 NOTE — Telephone Encounter (Signed)
Printed avs and calender of upcoming appointment. Per 1/28 los 

## 2018-08-08 NOTE — Progress Notes (Signed)
[facility] OFFICE PROGRESS NOTE   Diagnosis: Rectal cancer  INTERVAL HISTORY:   Tina Morales completed Xeloda and radiation on 07/21/2018.  She feels well.  She is scheduled for surgery 09/01/2018.  Objective:  Vital signs in last 24 hours:  Blood pressure 105/71, pulse 85, temperature 98.6 F (37 C), temperature source Oral, resp. rate 18, height 5' 7.75" (1.721 m), weight 126 lb 4.8 oz (57.3 kg), SpO2 (!) 20 %.    HEENT: No thrush or ulcers Lymphatics: No cervical, supraclavicular, axillary, or inguinal nodes Resp: Lungs clear bilaterally Cardio: Regular rate and rhythm GI: No hepatosplenomegaly, no mass, nontender Vascular: No leg edema  Skin: Palms without erythema  Portacath/PICC-without erythema  Lab Results:  Lab Results  Component Value Date   WBC 4.2 08/07/2018   HGB 9.0 (L) 08/07/2018   HCT 29.5 (L) 08/07/2018   MCV 71.4 (L) 08/07/2018   PLT 304 08/07/2018   NEUTROABS 3.0 08/07/2018    CMP  Lab Results  Component Value Date   NA 140 08/07/2018   K 4.0 08/07/2018   CL 102 08/07/2018   CO2 31 08/07/2018   GLUCOSE 89 08/07/2018   BUN 7 08/07/2018   CREATININE 0.60 08/07/2018   CALCIUM 9.5 08/07/2018   PROT 6.5 08/07/2018   ALBUMIN 3.8 08/07/2018   AST 18 08/07/2018   ALT 9 08/07/2018   ALKPHOS 106 08/07/2018   BILITOT 0.8 08/07/2018   GFRNONAA >60 08/07/2018   GFRAA >60 08/07/2018    Lab Results  Component Value Date   CEA1 7.92 (H) 05/11/2018    Imaging:  Ct Chest W Contrast  Result Date: 08/07/2018 CLINICAL DATA:  Rectal cancer follow-up. EXAM: CT CHEST, ABDOMEN, AND PELVIS WITH CONTRAST TECHNIQUE: Multidetector CT imaging of the chest, abdomen and pelvis was performed following the standard protocol during bolus administration of intravenous contrast. CONTRAST:  137m OMNIPAQUE IOHEXOL 300 MG/ML  SOLN COMPARISON:  Outside CT from 05/01/2018 and 02/10/2018. Site for these films is indicated as "EMI ". FINDINGS: CT CHEST  FINDINGS Cardiovascular: The heart size is normal. No substantial pericardial effusion. Right Port-A-Cath tip is positioned in the distal SVC. Mediastinum/Nodes: No mediastinal lymphadenopathy. There is no hilar lymphadenopathy. The esophagus has normal imaging features. There is no axillary lymphadenopathy. Lungs/Pleura: The central tracheobronchial airways are patent. Centrilobular emphysema noted. 4 mm right upper lobe nodule (54/6) is stable since 05/01/2018 but decreased since 02/10/2018 when it measured 5 mm. Tiny right upper lobe calcified granulomas identified on 83 and 88/series 6). Densely calcified right middle lobe granuloma associated. Tiny focus of tree-in-bud nodularity identified posterior right lower lobe on 105/6. 3 mm left lower lobe nodule (107/6) is stable. No suspicious pulmonary nodule or mass. No pleural effusion. Musculoskeletal: No worrisome lytic or sclerotic osseous abnormality. CT ABDOMEN PELVIS FINDINGS Hepatobiliary: No suspicious focal abnormality within the liver parenchyma. There is no evidence for gallstones, gallbladder wall thickening, or pericholecystic fluid. No intrahepatic or extrahepatic biliary dilation. Pancreas: No focal mass lesion. No dilatation of the main duct. No intraparenchymal cyst. No peripancreatic edema. Spleen: No splenomegaly. No focal mass lesion. Adrenals/Urinary Tract: No adrenal nodule or mass. Kidneys unremarkable. No evidence for hydroureter. The urinary bladder appears normal for the degree of distention. Stomach/Bowel: Stomach is unremarkable. No gastric wall thickening. No evidence of outlet obstruction. Duodenum is normally positioned as is the ligament of Treitz. No small bowel wall thickening. No small bowel dilatation. The terminal ileum is normal. The appendix is normal.: Unremarkable to the level  of the distal rectum. Just proximal to the anus, there is irregular and ill-defined distal rectal wall thickening. The discrete mass seen in this  region on previous CT of 02/10/2018 and MRI of 02/17/2018 is not readily evident by today's CT. Vascular/Lymphatic: There is abdominal aortic atherosclerosis without aneurysm. There is no gastrohepatic or hepatoduodenal ligament lymphadenopathy. No intraperitoneal or retroperitoneal lymphadenopathy. Stable appearance of multiple small para-aortic lymph nodes in the abdomen. No pelvic sidewall lymphadenopathy. The tiny perirectal lymph nodes seen on the 02/10/2018 exam have decreased or resolved in the interval. Tiny lymph nodes seen in the left perirectal space (113/2) measure up to 4 mm on today's exam which compares to 6 mm previously. Reproductive: Soft tissue of the rectum is indistinguishable from the posterior wall of the vagina, raising the question for vaginal involvement by tumor. Other: No intraperitoneal free fluid. Musculoskeletal: No worrisome lytic or sclerotic osseous abnormality. IMPRESSION: 1. Comparing to CT scan of 02/10/2018, the numerous pulmonary nodule seen on that study have resolved or decreased. There are only a few tiny residual noncalcified nodules in the lungs today including a 4 mm right upper lobe nodule that was 5 mm on 02/10/2018. 2. Small lymph nodes in the upper abdomen are stable. No evidence for hepatic metastatic disease. No abdominal lymphadenopathy. 3. Several tiny perirectal lymph nodes are decreased since 02/10/2018 but remains suspicious for metastatic involvement. 4. Primary lesion distal rectum towards the anus blends imperceptibly with the posterior wall the vagina. Vaginal involvement is a consideration. Electronically Signed   By: Misty Stanley M.D.   On: 08/07/2018 16:59   Ct Abdomen Pelvis W Contrast  Result Date: 08/07/2018 CLINICAL DATA:  Rectal cancer follow-up. EXAM: CT CHEST, ABDOMEN, AND PELVIS WITH CONTRAST TECHNIQUE: Multidetector CT imaging of the chest, abdomen and pelvis was performed following the standard protocol during bolus administration of  intravenous contrast. CONTRAST:  164m OMNIPAQUE IOHEXOL 300 MG/ML  SOLN COMPARISON:  Outside CT from 05/01/2018 and 02/10/2018. Site for these films is indicated as "EMI ". FINDINGS: CT CHEST FINDINGS Cardiovascular: The heart size is normal. No substantial pericardial effusion. Right Port-A-Cath tip is positioned in the distal SVC. Mediastinum/Nodes: No mediastinal lymphadenopathy. There is no hilar lymphadenopathy. The esophagus has normal imaging features. There is no axillary lymphadenopathy. Lungs/Pleura: The central tracheobronchial airways are patent. Centrilobular emphysema noted. 4 mm right upper lobe nodule (54/6) is stable since 05/01/2018 but decreased since 02/10/2018 when it measured 5 mm. Tiny right upper lobe calcified granulomas identified on 83 and 88/series 6). Densely calcified right middle lobe granuloma associated. Tiny focus of tree-in-bud nodularity identified posterior right lower lobe on 105/6. 3 mm left lower lobe nodule (107/6) is stable. No suspicious pulmonary nodule or mass. No pleural effusion. Musculoskeletal: No worrisome lytic or sclerotic osseous abnormality. CT ABDOMEN PELVIS FINDINGS Hepatobiliary: No suspicious focal abnormality within the liver parenchyma. There is no evidence for gallstones, gallbladder wall thickening, or pericholecystic fluid. No intrahepatic or extrahepatic biliary dilation. Pancreas: No focal mass lesion. No dilatation of the main duct. No intraparenchymal cyst. No peripancreatic edema. Spleen: No splenomegaly. No focal mass lesion. Adrenals/Urinary Tract: No adrenal nodule or mass. Kidneys unremarkable. No evidence for hydroureter. The urinary bladder appears normal for the degree of distention. Stomach/Bowel: Stomach is unremarkable. No gastric wall thickening. No evidence of outlet obstruction. Duodenum is normally positioned as is the ligament of Treitz. No small bowel wall thickening. No small bowel dilatation. The terminal ileum is normal. The  appendix is normal.: Unremarkable to the level  of the distal rectum. Just proximal to the anus, there is irregular and ill-defined distal rectal wall thickening. The discrete mass seen in this region on previous CT of 02/10/2018 and MRI of 02/17/2018 is not readily evident by today's CT. Vascular/Lymphatic: There is abdominal aortic atherosclerosis without aneurysm. There is no gastrohepatic or hepatoduodenal ligament lymphadenopathy. No intraperitoneal or retroperitoneal lymphadenopathy. Stable appearance of multiple small para-aortic lymph nodes in the abdomen. No pelvic sidewall lymphadenopathy. The tiny perirectal lymph nodes seen on the 02/10/2018 exam have decreased or resolved in the interval. Tiny lymph nodes seen in the left perirectal space (113/2) measure up to 4 mm on today's exam which compares to 6 mm previously. Reproductive: Soft tissue of the rectum is indistinguishable from the posterior wall of the vagina, raising the question for vaginal involvement by tumor. Other: No intraperitoneal free fluid. Musculoskeletal: No worrisome lytic or sclerotic osseous abnormality. IMPRESSION: 1. Comparing to CT scan of 02/10/2018, the numerous pulmonary nodule seen on that study have resolved or decreased. There are only a few tiny residual noncalcified nodules in the lungs today including a 4 mm right upper lobe nodule that was 5 mm on 02/10/2018. 2. Small lymph nodes in the upper abdomen are stable. No evidence for hepatic metastatic disease. No abdominal lymphadenopathy. 3. Several tiny perirectal lymph nodes are decreased since 02/10/2018 but remains suspicious for metastatic involvement. 4. Primary lesion distal rectum towards the anus blends imperceptibly with the posterior wall the vagina. Vaginal involvement is a consideration. Electronically Signed   By: Misty Stanley M.D.   On: 08/07/2018 16:59    Medications: I have reviewed the patient's current medications.   Assessment/Plan: 1. Rectal  cancer-rectal mass noted on digital exam 02/10/2018, colonoscopy confirmed a him my circumferential mass in the rectum ? 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 CTs 02/10/2018-anterior rectal wall thickening, pulmonary nodules measuring up to 9 mm concerning for metastases, few round perirectal lymph nodes ? Pelvic MRI 02/20/2018-hypermetabolic low rectal mass extending to the posterior vagina with 2 small enlarged perirectal lymph nodes, T4b,N1-2.3 cm from the anal verge ? PET scan 02/23/2018-hypermetabolic rectal mass, 8 mm hypermetabolic lingular nodule, scattered small bilateral lung nodules, some calcified, a few with mildly increased activity ? Cycle 1 FOLFOXIRI8/21/2019 ? Cycle 5 FOLFOXIRI10/16/2019 ? CTs 05/01/2018-significant interval response to therapy with decreased size of the primary rectal mass lesion. Decreasing perirectal lymphadenopathy. Decreased and/or resolved pulmonary nodules. No new sites of disease identified. ? Cycle 6 FOLFOXIRI10/31/2019 ? Radiation/Xeloda 06/12/2018-completed 07/21/2018 ? Xeloda dose reduced 07/10/2018 due to mucositis and diarrhea ? CTs 08/07/2018- compared to 02/10/2018- resolved and decreased pulmonary nodules, few tiny residual noncalcified nodules, decreased 20 perirectal lymph nodes, soft tissue of the rectum indistinguishable from posterior wall of vagina-  Vaginal involvement by tumor?  2.History of diarrhea and rectal pain secondary to #1 3.History of tobacco use 4.Anemia secondary to thalassemia, rectal bleeding, and potentially iron deficiency 5.   Diarrhea secondary to Xeloda and radiation.  Imodium as needed. 6.   Mucositis secondary to Xeloda.  Improved 07/19/2018.    Disposition: Tina Morales  completed the course of Xeloda and radiation.  The restaging CT reveals no evidence of disease progression.  I reviewed the CT images with her.  She is getting undergo resection of the rectal primary.  She understands there has been improvement in  the lung nodules.  This is suggestive of metastatic disease involving the lungs.  I will present her case at the GI tumor conference to review the chest imaging.  I will recommend surveillance of the chest in approximately 3 months.  We can consider additional systemic therapy or lung directed therapy if she has limited metastatic disease.  I will see her after the rectal surgery to review the pathology and decide on additional systemic therapy.  Tina Morales need an influenza vaccine today.  25 minutes were spent with the patient today.  The majority of the time was used for counseling and coordination of care.  Betsy Coder, MD  08/08/2018  8:23 AM

## 2018-08-28 ENCOUNTER — Telehealth: Payer: Self-pay | Admitting: Radiation Oncology

## 2018-08-28 NOTE — Telephone Encounter (Signed)
I spoke with the patient today. 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 I let her know her symptoms would not be typical of radiation. She's having some vaginal tighness and we discussed some hip tightness and discomfort she's also noticed. We discussed the need for a PT evaluation for pelvic floor exercises but want her to wait until cleared from surgery before proceeding. We will put together a kit of vaginal dilators for her as well and she knows to avoid using these until she has clearance from her surgeons.

## 2018-08-29 NOTE — Patient Instructions (Addendum)
Tina Morales  08/29/2018   Your procedure is scheduled on: 09-01-18    Report to [facility] Main  Entrance    Report to Short Stay at 5:30 AM    Call this number if you have problems the morning of surgery [phone]    Remember: DRINK 2 [place] AT  1000 PM AND 1 PRESURGERY DRINK THE DAY OF THE PROCEDURE 3 HOURS PRIOR TO SCHEDULED SURGERY. NO SOLIDS AFTER MIDNIGHT THE DAY PRIOR TO THE SURGERY. NOTHING BY MOUTH EXCEPT CLEAR LIQUIDS UNTIL THREE HOURS PRIOR TO SCHEDULED SURGERY. PLEASE FINISH PRESURGERY ENSURE DRINK PER SURGEON ORDER 3 HOURS PRIOR TO SCHEDULED SURGERY TIME WHICH NEEDS TO BE COMPLETED AT 4:30AM .    CLEAR LIQUID DIET   Foods Allowed                                                                     Foods Excluded  Coffee and tea, regular and decaf                             liquids that you cannot  Plain Jell-O in any flavor                                             see through such as: Fruit ices (not with fruit pulp)                                     milk, soups, orange juice  Iced Popsicles                                    All solid food Carbonated beverages, regular and diet                                    Cranberry, grape and apple juices Sports drinks like Gatorade Lightly seasoned clear broth or consume(fat free) Sugar, honey syrup  Sample Menu Breakfast                                Lunch                                     Supper Cranberry juice                    Beef broth                            Chicken broth Jell-O  Grape juice                           Apple juice Coffee or tea                        Jell-O                                      Popsicle                                                Coffee or tea                        Coffee or tea  _____________________________________________________________________    BRUSH YOUR  TEETH MORNING OF SURGERY AND RINSE YOUR MOUTH OUT, NO CHEWING GUM CANDY OR MINTS.     Take these medicines the morning of surgery with A SIP OF WATER: Omeprazole (Prilosec)                                You may not have any metal on your body including hair pins and              piercings  Do not wear jewelry, make-up, lotions, powders or perfumes, deodorant             Do not wear nail polish.  Do not shave  48 hours prior to surgery.     Do not bring valuables to the hospital. [place].  Contacts, dentures or bridgework may not be worn into surgery.  Leave suitcase in the car. After surgery it may be brought to your room.     Patients discharged the day of surgery will not be allowed to drive home. IF YOU ARE HAVING SURGERY AND GOING HOME THE SAME DAY, YOU MUST HAVE AN ADULT TO DRIVE YOU HOME AND BE WITH YOU FOR 24 HOURS. YOU MAY GO HOME BY TAXI OR UBER OR ORTHERWISE, BUT AN ADULT MUST ACCOMPANY YOU HOME AND STAY WITH YOU FOR 24 HOURS.    Special Instructions: Please follow a Clear Liquid Diet on the day of prep to prevent dehydration, per your surgeon's instructions              Please read over the following fact sheets you were given: _____________________________________________________________________             [facility] - Preparing for Surgery Before surgery, you can play an important role.  Because skin is not sterile, your skin needs to be as free of germs as possible.  You can reduce the number of germs on your skin by washing with CHG (chlorahexidine gluconate) soap before surgery.  CHG is an antiseptic cleaner which kills germs and bonds with the skin to continue killing germs even after washing. Please DO NOT use if you have an allergy to CHG or antibacterial soaps.  If your skin becomes reddened/irritated stop using the CHG and inform your nurse when you arrive at Short Stay. Do  not shave (including legs and  underarms) for at least 48 hours prior to the first CHG shower.  You may shave your face/neck. Please follow these instructions carefully:  1.  Shower with CHG Soap the night before surgery and the  morning of Surgery.  2.  If you choose to wash your hair, wash your hair first as usual with your  normal  shampoo.  3.  After you shampoo, rinse your hair and body thoroughly to remove the  shampoo.                           4.  Use CHG as you would any other liquid soap.  You can apply chg directly  to the skin and wash                       Gently with a scrungie or clean washcloth.  5.  Apply the CHG Soap to your body ONLY FROM THE NECK DOWN.   Do not use on face/ open                           Wound or open sores. Avoid contact with eyes, ears mouth and genitals (private parts).                       Wash face,  Genitals (private parts) with your normal soap.             6.  Wash thoroughly, paying special attention to the area where your surgery  will be performed.  7.  Thoroughly rinse your body with warm water from the neck down.  8.  DO NOT shower/wash with your normal soap after using and rinsing off  the CHG Soap.                9.  Pat yourself dry with a clean towel.            10.  Wear clean pajamas.            11.  Place clean sheets on your bed the night of your first shower and do not  sleep with pets. Day of Surgery : Do not apply any lotions/deodorants the morning of surgery.  Please wear clean clothes to the hospital/surgery center.  FAILURE TO FOLLOW THESE INSTRUCTIONS MAY RESULT IN THE CANCELLATION OF YOUR SURGERY PATIENT SIGNATURE_________________________________  NURSE SIGNATURE__________________________________  ________________________________________________________________________   Adam Phenix  An incentive spirometer is a tool that can help keep your lungs clear and active. This tool measures how well you are filling your lungs with each breath. Taking  long deep breaths may help reverse or decrease the chance of developing breathing (pulmonary) problems (especially infection) following:  A long period of time when you are unable to move or be active. BEFORE THE PROCEDURE   If the spirometer includes an indicator to show your best effort, your nurse or respiratory therapist will set it to a desired goal.  If possible, sit up straight or lean slightly forward. Try not to slouch.  Hold the incentive spirometer in an upright position. INSTRUCTIONS FOR USE  1. Sit on the edge of your bed if possible, or sit up as far as you can in bed or on a chair. 2. Hold the incentive spirometer in an upright position. 3. Breathe out normally. 4. Place the mouthpiece in  your mouth and seal your lips tightly around it. 5. Breathe in slowly and as deeply as possible, raising the piston or the ball toward the top of the column. 6. Hold your breath for 3-5 seconds or for as long as possible. Allow the piston or ball to fall to the bottom of the column. 7. Remove the mouthpiece from your mouth and breathe out normally. 8. Rest for a few seconds and repeat Steps 1 through 7 at least 10 times every 1-2 hours when you are awake. Take your time and take a few normal breaths between deep breaths. 9. The spirometer may include an indicator to show your best effort. Use the indicator as a goal to work toward during each repetition. 10. After each set of 10 deep breaths, practice coughing to be sure your lungs are clear. If you have an incision (the cut made at the time of surgery), support your incision when coughing by placing a pillow or rolled up towels firmly against it. Once you are able to get out of bed, walk around indoors and cough well. You may stop using the incentive spirometer when instructed by your caregiver.  RISKS AND COMPLICATIONS  Take your time so you do not get dizzy or light-headed.  If you are in pain, you may need to take or ask for pain  medication before doing incentive spirometry. It is harder to take a deep breath if you are having pain. AFTER USE  Rest and breathe slowly and easily.  It can be helpful to keep track of a log of your progress. Your caregiver can provide you with a simple table to help with this. If you are using the spirometer at home, follow these instructions: [place] IF:   You are having difficultly using the spirometer.  You have trouble using the spirometer as often as instructed.  Your pain medication is not giving enough relief while using the spirometer.  You develop fever of 100.5 F (38.1 C) or higher. SEEK IMMEDIATE MEDICAL CARE IF:   You cough up bloody sputum that had not been present before.  You develop fever of 102 F (38.9 C) or greater.  You develop worsening pain at or near the incision site. MAKE SURE YOU:   Understand these instructions.  Will watch your condition.  Will get help right away if you are not doing well or get worse. Document Released: 11/08/2006 Document Revised: 09/20/2011 Document Reviewed: 01/09/2007 Exit[employer] Patient Information 2014 [employer], [place].   ________________________________________________________________________  WHAT IS A BLOOD TRANSFUSION? Blood Transfusion Information  A transfusion is the replacement of blood or some of its parts. Blood is made up of multiple cells which provide different functions.  Red blood cells carry oxygen and are used for blood loss replacement.  White blood cells fight against infection.  Platelets control bleeding.  Plasma helps clot blood.  Other blood products are available for specialized needs, such as hemophilia or other clotting disorders. BEFORE THE TRANSFUSION  Who gives blood for transfusions?   Healthy volunteers who are fully evaluated to make sure their blood is safe. This is blood bank blood. Transfusion therapy is the safest it has ever been in the practice of medicine.  Before blood is taken from a donor, a complete history is taken to make sure that person has no history of diseases nor engages in risky social behavior (examples are intravenous drug use or sexual activity with multiple partners). The donor's travel history is screened to minimize risk  of transmitting infections, such as malaria. The donated blood is tested for signs of infectious diseases, such as HIV and hepatitis. The blood is then tested to be sure it is compatible with you in order to minimize the chance of a transfusion reaction. If you or a relative donates blood, this is often done in anticipation of surgery and is not appropriate for emergency situations. It takes many days to process the donated blood. RISKS AND COMPLICATIONS Although transfusion therapy is very safe and saves many lives, the main dangers of transfusion include:   Getting an infectious disease.  Developing a transfusion reaction. This is an allergic reaction to something in the blood you were given. Every precaution is taken to prevent this. The decision to have a blood transfusion has been considered carefully by your caregiver before blood is given. Blood is not given unless the benefits outweigh the risks. AFTER THE TRANSFUSION  Right after receiving a blood transfusion, you will usually feel much better and more energetic. This is especially true if your red blood cells have gotten low (anemic). The transfusion raises the level of the red blood cells which carry oxygen, and this usually causes an energy increase.  The nurse administering the transfusion will monitor you carefully for complications. HOME CARE INSTRUCTIONS  No special instructions are needed after a transfusion. You may find your energy is better. Speak with your caregiver about any limitations on activity for underlying diseases you may have. SEEK MEDICAL CARE IF:   Your condition is not improving after your transfusion.  You develop redness or  irritation at the intravenous (IV) site. SEEK IMMEDIATE MEDICAL CARE IF:  Any of the following symptoms occur over the next 12 hours:  Shaking chills.  You have a temperature by mouth above 102 F (38.9 C), not controlled by medicine.  Chest, back, or muscle pain.  People around you feel you are not acting correctly or are confused.  Shortness of breath or difficulty breathing.  Dizziness and fainting.  You get a rash or develop hives.  You have a decrease in urine output.  Your urine turns a dark color or changes to pink, red, or brown. Any of the following symptoms occur over the next 10 days:  You have a temperature by mouth above 102 F (38.9 C), not controlled by medicine.  Shortness of breath.  Weakness after normal activity.  The white part of the eye turns yellow (jaundice).  You have a decrease in the amount of urine or are urinating less often.  Your urine turns a dark color or changes to pink, red, or brown. Document Released: 06/25/2000 Document Revised: 09/20/2011 Document Reviewed: 02/12/2008 [facility] Patient Information 2014 [place], [place].  _______________________________________________________________________

## 2018-08-30 ENCOUNTER — Encounter (HOSPITAL_COMMUNITY)
Admission: RE | Admit: 2018-08-30 | Discharge: 2018-08-30 | Disposition: A | Discharge status: Home / Self Care | Payer: 59 | Source: Ambulatory Visit | Attending: Surgery | Admitting: Surgery

## 2018-08-30 ENCOUNTER — Other Ambulatory Visit: Payer: Self-pay

## 2018-08-30 ENCOUNTER — Encounter (HOSPITAL_COMMUNITY): Payer: Self-pay | Admitting: *Deleted

## 2018-08-30 DIAGNOSIS — Z01818 Encounter for other preprocedural examination: Secondary | ICD-10-CM | POA: Insufficient documentation

## 2018-08-30 LAB — CBC WITH DIFFERENTIAL/PLATELET
Abs Immature Granulocytes: 0.05 10*3/uL (ref 0.00–0.07)
Basophils Absolute: 0 10*3/uL (ref 0.0–0.1)
Basophils Relative: 0 %
Eosinophils Absolute: 0.2 10*3/uL (ref 0.0–0.5)
Eosinophils Relative: 2 %
HCT: 34 % — ABNORMAL LOW (ref 36.0–46.0)
Hemoglobin: 9.8 g/dL — ABNORMAL LOW (ref 12.0–15.0)
Immature Granulocytes: 1 %
Lymphocytes Relative: 8 %
Lymphs Abs: 0.8 10*3/uL (ref 0.7–4.0)
MCH: 20.7 pg — ABNORMAL LOW (ref 26.0–34.0)
MCHC: 28.8 g/dL — ABNORMAL LOW (ref 30.0–36.0)
MCV: 71.7 fL — ABNORMAL LOW (ref 80.0–100.0)
Monocytes Absolute: 0.6 10*3/uL (ref 0.1–1.0)
Monocytes Relative: 7 %
NEUTROS ABS: 7.9 10*3/uL — AB (ref 1.7–7.7)
Neutrophils Relative %: 82 %
Platelets: 305 10*3/uL (ref 150–400)
RBC: 4.74 MIL/uL (ref 3.87–5.11)
RDW: 15 % (ref 11.5–15.5)
WBC: 9.5 10*3/uL (ref 4.0–10.5)
nRBC: 0 % (ref 0.0–0.2)

## 2018-08-30 LAB — COMPREHENSIVE METABOLIC PANEL
ALT: 9 U/L (ref 0–44)
AST: 16 U/L (ref 15–41)
Albumin: 4.2 g/dL (ref 3.5–5.0)
Alkaline Phosphatase: 102 U/L (ref 38–126)
Anion gap: 8 (ref 5–15)
BUN: 11 mg/dL (ref 6–20)
CO2: 28 mmol/L (ref 22–32)
Calcium: 9 mg/dL (ref 8.9–10.3)
Chloride: 101 mmol/L (ref 98–111)
Creatinine, Ser: 0.44 mg/dL (ref 0.44–1.00)
GFR calc Af Amer: >60 mL/min (ref >60)
GFR calc non Af Amer: >60 mL/min (ref >60)
Glucose, Bld: 104 mg/dL — ABNORMAL HIGH (ref 70–99)
Potassium: 3.7 mmol/L (ref 3.5–5.1)
Sodium: 137 mmol/L (ref 135–145)
Total Bilirubin: 0.7 mg/dL (ref 0.3–1.2)
Total Protein: 7.4 g/dL (ref 6.5–8.1)

## 2018-08-30 LAB — PROTIME-INR
INR: 1.08
Prothrombin Time: 13.9 seconds (ref 11.4–15.2)

## 2018-08-30 LAB — APTT: aPTT: 37 seconds — ABNORMAL HIGH (ref 24–36)

## 2018-08-30 NOTE — Progress Notes (Signed)
08-30-18 PTT and CBC w/Diff routed to Dr. Orest Dikes office for review.

## 2018-08-30 NOTE — Consult Note (Signed)
[place] Nurse requested for preoperative stoma site marking  Discussed surgical procedure and stoma creation with patient and family.  Explained role of the [place] nurse team.  Provided the patient with educational booklet and provided samples of pouching options.  Answered patient and family questions. Husband accompanies patient today.   Examined patient sitting and standing in order to place the marking in the patient's visual field, away from any creases or abdominal contour issues and within the rectus muscle.  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  Marked for ileostomy in the RLQ  4 cm to the right of the umbilicus and  4  cm below the umbilicus.  Patient's abdomen cleansed with CHG wipes at site markings, allowed to air dry prior to marking.Covered mark with thin film transparent dressing to preserve mark until date of surgery.   [place] Nurse team will follow up with patient after surgery for continue ostomy care and teaching.   Domenic Moras MSN, RN, FNP-BC [facility] Wound, Ostomy, Continence Nurse Pager [phone]

## 2018-08-31 ENCOUNTER — Encounter (HOSPITAL_COMMUNITY): Payer: Self-pay | Admitting: Anesthesiology

## 2018-08-31 LAB — HEMOGLOBIN A1C
Hgb A1c MFr Bld: 4.9 % (ref 4.8–5.6)
Mean Plasma Glucose: 94 mg/dL

## 2018-08-31 LAB — ABO/RH: ABO/RH(D): A POS

## 2018-08-31 NOTE — Anesthesia Preprocedure Evaluation (Addendum)
Anesthesia Evaluation  Patient identified by MRN, date of birth, ID band Patient awake    Reviewed: Allergy & Precautions, NPO status , Patient's Chart, lab work & pertinent test results  Airway Mallampati: II  TM Distance: >3 FB Neck ROM: Full    Dental  (+) Poor Dentition, Chipped, Missing Multiple carious teeth:   Pulmonary former smoker,    Pulmonary exam normal breath sounds clear to auscultation       Cardiovascular negative cardio ROS Normal cardiovascular exam Rhythm:Regular Rate:Normal     Neuro/Psych  Headaches, Anxiety    GI/Hepatic Neg liver ROS, GERD  Medicated and Controlled,Rectal Ca- stage 4    Endo/Other  negative endocrine ROS  Renal/GU negative Renal ROS  negative genitourinary   Musculoskeletal negative musculoskeletal ROS (+)   Abdominal   Peds  Hematology  (+) anemia ,   Anesthesia Other Findings   Reproductive/Obstetrics                           Anesthesia Physical Anesthesia Plan  ASA: II  Anesthesia Plan: General   Post-op Pain Management:    Induction: Intravenous  PONV Risk Score and Plan: 4 or greater and Scopolamine patch - Pre-op, Midazolam, Dexamethasone, Ondansetron and Treatment may vary due to age or medical condition  Airway Management Planned: Oral ETT  Additional Equipment: Arterial line  Intra-op Plan:   Post-operative Plan: Extubation in OR  Informed Consent: I have reviewed the patients History and Physical, chart, labs and discussed the procedure including the risks, benefits and alternatives for the proposed anesthesia with the patient or authorized representative who has indicated his/her understanding and acceptance.     Dental advisory given  Plan Discussed with: CRNA and Surgeon  Anesthesia Plan Comments:        Anesthesia Quick Evaluation

## 2018-09-01 ENCOUNTER — Other Ambulatory Visit: Payer: Self-pay

## 2018-09-01 ENCOUNTER — Inpatient Hospital Stay (HOSPITAL_COMMUNITY): Payer: 59

## 2018-09-01 ENCOUNTER — Encounter (HOSPITAL_COMMUNITY)
Admission: RE | Disposition: A | Discharge status: Home / Self Care | Payer: Self-pay | Source: Home / Self Care | Attending: Surgery

## 2018-09-01 ENCOUNTER — Inpatient Hospital Stay (HOSPITAL_COMMUNITY): Payer: 59 | Admitting: Physician Assistant

## 2018-09-01 ENCOUNTER — Inpatient Hospital Stay (HOSPITAL_COMMUNITY): Payer: 59 | Admitting: Certified Registered Nurse Anesthetist

## 2018-09-01 ENCOUNTER — Encounter (HOSPITAL_COMMUNITY): Payer: Self-pay | Admitting: Anesthesiology

## 2018-09-01 ENCOUNTER — Inpatient Hospital Stay (HOSPITAL_COMMUNITY)
Admission: RE | Admit: 2018-09-01 | Discharge: 2018-09-05 | DRG: 330 | Disposition: A | Discharge status: Home / Self Care | Payer: 59 | Attending: Surgery | Admitting: Surgery

## 2018-09-01 DIAGNOSIS — C7982 Secondary malignant neoplasm of genital organs: Secondary | ICD-10-CM

## 2018-09-01 DIAGNOSIS — Z9221 Personal history of antineoplastic chemotherapy: Secondary | ICD-10-CM

## 2018-09-01 DIAGNOSIS — C20 Malignant neoplasm of rectum: Secondary | ICD-10-CM | POA: Diagnosis not present

## 2018-09-01 DIAGNOSIS — Z87891 Personal history of nicotine dependence: Secondary | ICD-10-CM | POA: Diagnosis not present

## 2018-09-01 DIAGNOSIS — C19 Malignant neoplasm of rectosigmoid junction: Secondary | ICD-10-CM | POA: Diagnosis present

## 2018-09-01 DIAGNOSIS — Z882 Allergy status to sulfonamides status: Secondary | ICD-10-CM | POA: Diagnosis not present

## 2018-09-01 DIAGNOSIS — K219 Gastro-esophageal reflux disease without esophagitis: Secondary | ICD-10-CM | POA: Diagnosis present

## 2018-09-01 DIAGNOSIS — F419 Anxiety disorder, unspecified: Secondary | ICD-10-CM | POA: Diagnosis present

## 2018-09-01 DIAGNOSIS — Z923 Personal history of irradiation: Secondary | ICD-10-CM

## 2018-09-01 DIAGNOSIS — I9589 Other hypotension: Secondary | ICD-10-CM | POA: Diagnosis not present

## 2018-09-01 DIAGNOSIS — D62 Acute posthemorrhagic anemia: Secondary | ICD-10-CM | POA: Diagnosis not present

## 2018-09-01 DIAGNOSIS — Z79899 Other long term (current) drug therapy: Secondary | ICD-10-CM | POA: Diagnosis not present

## 2018-09-01 HISTORY — PX: CYSTOSCOPY W/ RETROGRADES: SHX1426

## 2018-09-01 HISTORY — PX: CYSTOSCOPY WITH STENT PLACEMENT: SHX5790

## 2018-09-01 HISTORY — PX: ROBOTIC ASSISTED TOTAL HYSTERECTOMY WITH BILATERAL SALPINGO OOPHERECTOMY: SHX6086

## 2018-09-01 LAB — POCT I-STAT 7, (LYTES, BLD GAS, ICA,H+H)
Acid-Base Excess: 1 mmol/L (ref 0.0–2.0)
Bicarbonate: 27.7 mmol/L (ref 20.0–28.0)
Calcium, Ion: 1.19 mmol/L (ref 1.15–1.40)
HCT: 27 % — ABNORMAL LOW (ref 36.0–46.0)
Hemoglobin: 9.2 g/dL — ABNORMAL LOW (ref 12.0–15.0)
O2 Saturation: 100 %
PO2 ART: 382 mmHg — AB (ref 83.0–108.0)
Potassium: 3.7 mmol/L (ref 3.5–5.1)
Sodium: 137 mmol/L (ref 135–145)
TCO2: 29 mmol/L (ref 22–32)
pCO2 arterial: 56.5 mmHg — ABNORMAL HIGH (ref 32.0–48.0)
pH, Arterial: 7.297 — ABNORMAL LOW (ref 7.350–7.450)

## 2018-09-01 LAB — TYPE AND SCREEN
ABO/RH(D): A POS
Antibody Screen: NEGATIVE

## 2018-09-01 SURGERY — CLOSURE, COLOSTOMY, ROBOT-ASSISTED
Anesthesia: General | Site: Ureter

## 2018-09-01 MED ORDER — KETAMINE HCL 10 MG/ML IJ SOLN
INTRAMUSCULAR | Status: AC
  Filled 2018-09-01: qty 1

## 2018-09-01 MED ORDER — HYDROMORPHONE HCL 1 MG/ML IJ SOLN
INTRAMUSCULAR | Status: AC
  Administered 2018-09-01: 0.5 mg via INTRAVENOUS
  Filled 2018-09-01: qty 1

## 2018-09-01 MED ORDER — LACTATED RINGERS IV SOLN
INTRAVENOUS | Status: DC | PRN
  Administered 2018-09-01 (×2): via INTRAVENOUS

## 2018-09-01 MED ORDER — ONDANSETRON HCL 4 MG/2ML IJ SOLN
INTRAMUSCULAR | Status: AC
  Filled 2018-09-01: qty 2

## 2018-09-01 MED ORDER — HYDROMORPHONE HCL 1 MG/ML IJ SOLN
0.2500 mg | INTRAMUSCULAR | Status: DC | PRN
  Administered 2018-09-01 (×2): 0.5 mg via INTRAVENOUS

## 2018-09-01 MED ORDER — PHENYLEPHRINE 40 MCG/ML (10ML) SYRINGE FOR IV PUSH (FOR BLOOD PRESSURE SUPPORT)
PREFILLED_SYRINGE | INTRAVENOUS | Status: DC | PRN
  Administered 2018-09-01 (×4): 40 ug via INTRAVENOUS
  Administered 2018-09-01: 80 ug via INTRAVENOUS
  Administered 2018-09-01 (×2): 40 ug via INTRAVENOUS

## 2018-09-01 MED ORDER — BUPIVACAINE HCL (PF) 0.25 % IJ SOLN
INTRAMUSCULAR | Status: AC
  Filled 2018-09-01: qty 30

## 2018-09-01 MED ORDER — IBUPROFEN 200 MG PO TABS
600.0000 mg | ORAL_TABLET | Freq: Four times a day (QID) | ORAL | Status: DC | PRN
  Administered 2018-09-01 – 2018-09-04 (×7): 600 mg via ORAL
  Filled 2018-09-01 (×8): qty 3

## 2018-09-01 MED ORDER — GABAPENTIN 300 MG PO CAPS
300.0000 mg | ORAL_CAPSULE | ORAL | Status: AC
  Administered 2018-09-01: 300 mg via ORAL
  Filled 2018-09-01: qty 1

## 2018-09-01 MED ORDER — ALVIMOPAN 12 MG PO CAPS
12.0000 mg | ORAL_CAPSULE | Freq: Two times a day (BID) | ORAL | Status: DC
  Administered 2018-09-02: 12 mg via ORAL
  Filled 2018-09-01 (×2): qty 1

## 2018-09-01 MED ORDER — ALPRAZOLAM 0.5 MG PO TABS
0.5000 mg | ORAL_TABLET | Freq: Every day | ORAL | Status: DC
  Administered 2018-09-01 – 2018-09-04 (×4): 0.5 mg via ORAL
  Filled 2018-09-01 (×4): qty 1

## 2018-09-01 MED ORDER — LACTATED RINGERS IV SOLN
INTRAVENOUS | Status: DC | PRN
  Administered 2018-09-01: 07:00:00 via INTRAVENOUS

## 2018-09-01 MED ORDER — DEXAMETHASONE SODIUM PHOSPHATE 10 MG/ML IJ SOLN
INTRAMUSCULAR | Status: AC
  Filled 2018-09-01: qty 1

## 2018-09-01 MED ORDER — HEPARIN SODIUM (PORCINE) 5000 UNIT/ML IJ SOLN
5000.0000 [IU] | Freq: Once | INTRAMUSCULAR | Status: AC
  Administered 2018-09-01: 5000 [IU] via SUBCUTANEOUS
  Filled 2018-09-01: qty 1

## 2018-09-01 MED ORDER — ROCURONIUM BROMIDE 100 MG/10ML IV SOLN
INTRAVENOUS | Status: AC
  Filled 2018-09-01: qty 1

## 2018-09-01 MED ORDER — ACETAMINOPHEN 325 MG PO TABS
650.0000 mg | ORAL_TABLET | Freq: Four times a day (QID) | ORAL | Status: DC
  Administered 2018-09-01 – 2018-09-05 (×14): 650 mg via ORAL
  Filled 2018-09-01 (×14): qty 2

## 2018-09-01 MED ORDER — DIPHENHYDRAMINE HCL 50 MG/ML IJ SOLN: 12.5000 mg | Freq: Four times a day (QID) | INTRAMUSCULAR | Status: DC | PRN

## 2018-09-01 MED ORDER — BUPIVACAINE LIPOSOME 1.3 % IJ SUSP
20.0000 mL | Freq: Once | INTRAMUSCULAR | Status: DC
  Filled 2018-09-01: qty 20

## 2018-09-01 MED ORDER — ALUM & MAG HYDROXIDE-SIMETH 200-200-20 MG/5ML PO SUSP: 30.0000 mL | Freq: Four times a day (QID) | ORAL | Status: DC | PRN

## 2018-09-01 MED ORDER — KETAMINE HCL 10 MG/ML IJ SOLN
INTRAMUSCULAR | Status: DC | PRN
  Administered 2018-09-01: 20 mg via INTRAVENOUS
  Administered 2018-09-01: 10 mg via INTRAVENOUS

## 2018-09-01 MED ORDER — STERILE WATER FOR IRRIGATION IR SOLN
Status: DC | PRN
  Administered 2018-09-01: 1000 mL

## 2018-09-01 MED ORDER — FERROUS SULFATE 325 (65 FE) MG PO TABS
325.0000 mg | ORAL_TABLET | Freq: Two times a day (BID) | ORAL | Status: DC
  Administered 2018-09-02 – 2018-09-05 (×7): 325 mg via ORAL
  Filled 2018-09-01 (×7): qty 1

## 2018-09-01 MED ORDER — HEPARIN SODIUM (PORCINE) 5000 UNIT/ML IJ SOLN
5000.0000 [IU] | Freq: Three times a day (TID) | INTRAMUSCULAR | Status: DC
  Administered 2018-09-01 – 2018-09-05 (×12): 5000 [IU] via SUBCUTANEOUS
  Filled 2018-09-01 (×12): qty 1

## 2018-09-01 MED ORDER — NEOMYCIN SULFATE 500 MG PO TABS: 1000.0000 mg | ORAL_TABLET | ORAL | Status: DC

## 2018-09-01 MED ORDER — LIDOCAINE HCL 2 % IJ SOLN
INTRAMUSCULAR | Status: AC
  Filled 2018-09-01: qty 20

## 2018-09-01 MED ORDER — ONDANSETRON HCL 4 MG/2ML IJ SOLN
INTRAMUSCULAR | Status: DC | PRN
  Administered 2018-09-01 (×2): 4 mg via INTRAVENOUS

## 2018-09-01 MED ORDER — PROPOFOL 10 MG/ML IV BOLUS
INTRAVENOUS | Status: AC
  Filled 2018-09-01: qty 20

## 2018-09-01 MED ORDER — ACETAMINOPHEN 500 MG PO TABS
1000.0000 mg | ORAL_TABLET | ORAL | Status: AC
  Administered 2018-09-01: 1000 mg via ORAL
  Filled 2018-09-01: qty 2

## 2018-09-01 MED ORDER — MIDAZOLAM HCL 5 MG/5ML IJ SOLN
INTRAMUSCULAR | Status: DC | PRN
  Administered 2018-09-01: 2 mg via INTRAVENOUS

## 2018-09-01 MED ORDER — SCOPOLAMINE 1 MG/3DAYS TD PT72: 1.0000 | MEDICATED_PATCH | TRANSDERMAL | Status: DC

## 2018-09-01 MED ORDER — SODIUM CHLORIDE 0.9 % IV SOLN
2.0000 g | INTRAVENOUS | Status: AC
  Administered 2018-09-01: 2 g via INTRAVENOUS
  Filled 2018-09-01: qty 2

## 2018-09-01 MED ORDER — GABAPENTIN 300 MG PO CAPS
300.0000 mg | ORAL_CAPSULE | Freq: Three times a day (TID) | ORAL | Status: DC
  Administered 2018-09-01 – 2018-09-05 (×12): 300 mg via ORAL
  Filled 2018-09-01 (×12): qty 1

## 2018-09-01 MED ORDER — EPHEDRINE SULFATE-NACL 50-0.9 MG/10ML-% IV SOSY
PREFILLED_SYRINGE | INTRAVENOUS | Status: DC | PRN
  Administered 2018-09-01 (×4): 5 mg via INTRAVENOUS

## 2018-09-01 MED ORDER — LIDOCAINE 2% (20 MG/ML) 5 ML SYRINGE
INTRAMUSCULAR | Status: DC | PRN
  Administered 2018-09-01: 1.5 mg/kg/h via INTRAVENOUS

## 2018-09-01 MED ORDER — TRAMADOL HCL 50 MG PO TABS
50.0000 mg | ORAL_TABLET | Freq: Four times a day (QID) | ORAL | Status: DC | PRN
  Administered 2018-09-02 – 2018-09-05 (×9): 50 mg via ORAL
  Filled 2018-09-01 (×9): qty 1

## 2018-09-01 MED ORDER — ALVIMOPAN 12 MG PO CAPS
12.0000 mg | ORAL_CAPSULE | ORAL | Status: AC
  Administered 2018-09-01: 12 mg via ORAL
  Filled 2018-09-01: qty 1

## 2018-09-01 MED ORDER — MIDAZOLAM HCL 2 MG/2ML IJ SOLN
INTRAMUSCULAR | Status: AC
  Filled 2018-09-01: qty 2

## 2018-09-01 MED ORDER — LACTATED RINGERS IR SOLN
Status: DC | PRN
  Administered 2018-09-01: 1000 mL

## 2018-09-01 MED ORDER — CYCLOBENZAPRINE HCL 5 MG PO TABS
5.0000 mg | ORAL_TABLET | Freq: Three times a day (TID) | ORAL | Status: DC | PRN
  Administered 2018-09-01 – 2018-09-05 (×4): 5 mg via ORAL
  Filled 2018-09-01 (×5): qty 1

## 2018-09-01 MED ORDER — BUPIVACAINE LIPOSOME 1.3 % IJ SUSP
INTRAMUSCULAR | Status: DC | PRN
  Administered 2018-09-01: 20 mL

## 2018-09-01 MED ORDER — LIDOCAINE 2% (20 MG/ML) 5 ML SYRINGE
INTRAMUSCULAR | Status: AC
  Filled 2018-09-01: qty 5

## 2018-09-01 MED ORDER — DEXAMETHASONE SODIUM PHOSPHATE 10 MG/ML IJ SOLN
INTRAMUSCULAR | Status: DC | PRN
  Administered 2018-09-01: 10 mg via INTRAVENOUS

## 2018-09-01 MED ORDER — FENTANYL CITRATE (PF) 100 MCG/2ML IJ SOLN
INTRAMUSCULAR | Status: DC | PRN
  Administered 2018-09-01 (×6): 50 ug via INTRAVENOUS

## 2018-09-01 MED ORDER — FENTANYL CITRATE (PF) 100 MCG/2ML IJ SOLN
INTRAMUSCULAR | Status: AC
  Filled 2018-09-01: qty 2

## 2018-09-01 MED ORDER — ONDANSETRON HCL 4 MG PO TABS: 4.0000 mg | ORAL_TABLET | Freq: Four times a day (QID) | ORAL | Status: DC | PRN

## 2018-09-01 MED ORDER — METRONIDAZOLE 500 MG PO TABS: 1000.0000 mg | ORAL_TABLET | ORAL | Status: DC

## 2018-09-01 MED ORDER — IOHEXOL 300 MG/ML  SOLN
INTRAMUSCULAR | Status: DC | PRN
  Administered 2018-09-01: 14 mL

## 2018-09-01 MED ORDER — HYDROMORPHONE HCL 1 MG/ML IJ SOLN
0.5000 mg | INTRAMUSCULAR | Status: DC | PRN
  Administered 2018-09-01 – 2018-09-03 (×9): 0.5 mg via INTRAVENOUS
  Filled 2018-09-01 (×9): qty 0.5

## 2018-09-01 MED ORDER — LACTATED RINGERS IV SOLN
INTRAVENOUS | Status: DC
  Administered 2018-09-01: 17:00:00 via INTRAVENOUS

## 2018-09-01 MED ORDER — LIP MEDEX EX OINT
TOPICAL_OINTMENT | CUTANEOUS | Status: AC
  Administered 2018-09-01: 16:00:00
  Filled 2018-09-01: qty 7

## 2018-09-01 MED ORDER — SUGAMMADEX SODIUM 200 MG/2ML IV SOLN
INTRAVENOUS | Status: AC
  Filled 2018-09-01: qty 2

## 2018-09-01 MED ORDER — SUCCINYLCHOLINE CHLORIDE 200 MG/10ML IV SOSY
PREFILLED_SYRINGE | INTRAVENOUS | Status: AC
  Filled 2018-09-01: qty 10

## 2018-09-01 MED ORDER — SUCCINYLCHOLINE CHLORIDE 200 MG/10ML IV SOSY
PREFILLED_SYRINGE | INTRAVENOUS | Status: DC | PRN
  Administered 2018-09-01: 120 mg via INTRAVENOUS

## 2018-09-01 MED ORDER — PROPOFOL 10 MG/ML IV BOLUS
INTRAVENOUS | Status: DC | PRN
  Administered 2018-09-01: 140 mg via INTRAVENOUS

## 2018-09-01 MED ORDER — DIPHENHYDRAMINE HCL 12.5 MG/5ML PO ELIX: 12.5000 mg | ORAL_SOLUTION | Freq: Four times a day (QID) | ORAL | Status: DC | PRN

## 2018-09-01 MED ORDER — SUGAMMADEX SODIUM 200 MG/2ML IV SOLN
INTRAVENOUS | Status: DC | PRN
  Administered 2018-09-01: 200 mg via INTRAVENOUS

## 2018-09-01 MED ORDER — ONDANSETRON HCL 4 MG/2ML IJ SOLN: 4.0000 mg | Freq: Four times a day (QID) | INTRAMUSCULAR | Status: DC | PRN

## 2018-09-01 MED ORDER — CHLORHEXIDINE GLUCONATE CLOTH 2 % EX PADS: 6.0000 | MEDICATED_PAD | Freq: Once | CUTANEOUS | Status: DC

## 2018-09-01 MED ORDER — MEPERIDINE HCL 50 MG/ML IJ SOLN: 6.2500 mg | INTRAMUSCULAR | Status: DC | PRN

## 2018-09-01 MED ORDER — BUPIVACAINE HCL 0.25 % IJ SOLN
INTRAMUSCULAR | Status: DC | PRN
  Administered 2018-09-01: 30 mL

## 2018-09-01 MED ORDER — POLYETHYLENE GLYCOL 3350 17 GM/SCOOP PO POWD: 1.0000 | Freq: Once | ORAL | Status: DC

## 2018-09-01 MED ORDER — HYDRALAZINE HCL 20 MG/ML IJ SOLN: 10.0000 mg | INTRAMUSCULAR | Status: DC | PRN

## 2018-09-01 MED ORDER — ENSURE SURGERY PO LIQD
237.0000 mL | Freq: Two times a day (BID) | ORAL | Status: DC
  Administered 2018-09-01 – 2018-09-05 (×8): 237 mL via ORAL
  Filled 2018-09-01 (×9): qty 237

## 2018-09-01 MED ORDER — PROMETHAZINE HCL 25 MG/ML IJ SOLN: 6.2500 mg | INTRAMUSCULAR | Status: DC | PRN

## 2018-09-01 MED ORDER — SODIUM CHLORIDE 0.9 % IV BOLUS
500.0000 mL | Freq: Once | INTRAVENOUS | Status: AC
  Administered 2018-09-01: 500 mL via INTRAVENOUS

## 2018-09-01 MED ORDER — LIDOCAINE 2% (20 MG/ML) 5 ML SYRINGE
INTRAMUSCULAR | Status: DC | PRN
  Administered 2018-09-01: 100 mg via INTRAVENOUS

## 2018-09-01 MED ORDER — PANTOPRAZOLE SODIUM 40 MG PO TBEC
40.0000 mg | DELAYED_RELEASE_TABLET | Freq: Every day | ORAL | Status: DC
  Administered 2018-09-02 – 2018-09-05 (×4): 40 mg via ORAL
  Filled 2018-09-01 (×4): qty 1

## 2018-09-01 MED ORDER — ROCURONIUM BROMIDE 10 MG/ML (PF) SYRINGE
PREFILLED_SYRINGE | INTRAVENOUS | Status: DC | PRN
  Administered 2018-09-01 (×2): 10 mg via INTRAVENOUS
  Administered 2018-09-01: 40 mg via INTRAVENOUS
  Administered 2018-09-01 (×2): 20 mg via INTRAVENOUS

## 2018-09-01 MED ORDER — SCOPOLAMINE 1 MG/3DAYS TD PT72
MEDICATED_PATCH | TRANSDERMAL | Status: AC
  Administered 2018-09-01: 1.5 mg
  Filled 2018-09-01: qty 1

## 2018-09-01 MED ORDER — PHENYLEPHRINE 40 MCG/ML (10ML) SYRINGE FOR IV PUSH (FOR BLOOD PRESSURE SUPPORT)
PREFILLED_SYRINGE | INTRAVENOUS | Status: AC
  Filled 2018-09-01: qty 10

## 2018-09-01 SURGICAL SUPPLY — 163 items
ADAPTER GOLDBERG URETERAL (ADAPTER) ×6 IMPLANT
APPLICATOR SURGIFLO ENDO (HEMOSTASIS) IMPLANT
APPLIER CLIP 5 13 M/L LIGAMAX5 (MISCELLANEOUS)
APPLIER CLIP ROT 10 11.4 M/L (STAPLE)
BAG LAPAROSCOPIC 12 15 PORT 16 (BASKET) IMPLANT
BAG RETRIEVAL 12/15 (BASKET)
BAG RETRIEVAL 12/15MM (BASKET)
BAG URO CATCHER STRL LF (MISCELLANEOUS) ×6 IMPLANT
BASKET LASER NITINOL 1.9FR (BASKET) IMPLANT
BASKET ZERO TIP NITINOL 2.4FR (BASKET) IMPLANT
BLADE EXTENDED COATED 6.5IN (ELECTRODE) ×6 IMPLANT
CANNULA REDUC XI 12-8 STAPL (CANNULA) ×1
CANNULA REDUC XI 12-8MM STAPL (CANNULA) ×1
CANNULA REDUCER 12-8 DVNC XI (CANNULA) ×4 IMPLANT
CATH INTERMIT  6FR 70CM (CATHETERS) ×12 IMPLANT
CELLS DAT CNTRL 66122 CELL SVR (MISCELLANEOUS) IMPLANT
CHLORAPREP W/TINT 26ML (MISCELLANEOUS) ×6 IMPLANT
CLIP APPLIE 5 13 M/L LIGAMAX5 (MISCELLANEOUS) IMPLANT
CLIP APPLIE ROT 10 11.4 M/L (STAPLE) IMPLANT
CLIP VESOLOCK LG 6/CT PURPLE (CLIP) IMPLANT
CLIP VESOLOCK MED LG 6/CT (CLIP) IMPLANT
CLOTH BEACON ORANGE TIMEOUT ST (SAFETY) ×6 IMPLANT
COVER BACK TABLE 60X90IN (DRAPES) ×6 IMPLANT
COVER SURGICAL LIGHT HANDLE (MISCELLANEOUS) ×12 IMPLANT
COVER TIP SHEARS 8 DVNC (MISCELLANEOUS) ×8 IMPLANT
COVER TIP SHEARS 8MM DA VINCI (MISCELLANEOUS) ×4
COVER WAND RF STERILE (DRAPES) IMPLANT
DECANTER SPIKE VIAL GLASS SM (MISCELLANEOUS) ×6 IMPLANT
DERMABOND ADVANCED (GAUZE/BANDAGES/DRESSINGS) ×2
DERMABOND ADVANCED .7 DNX12 (GAUZE/BANDAGES/DRESSINGS) ×4 IMPLANT
DEVICE TROCAR PUNCTURE CLOSURE (ENDOMECHANICALS) IMPLANT
DRAIN CHANNEL 19F RND (DRAIN) ×12 IMPLANT
DRAPE ARM DVNC X/XI (DISPOSABLE) ×32 IMPLANT
DRAPE COLUMN DVNC XI (DISPOSABLE) ×8 IMPLANT
DRAPE DA VINCI XI ARM (DISPOSABLE) ×16
DRAPE DA VINCI XI COLUMN (DISPOSABLE) ×4
DRAPE SHEET LG 3/4 BI-LAMINATE (DRAPES) ×6 IMPLANT
DRAPE SURG IRRIG POUCH 19X23 (DRAPES) ×12 IMPLANT
DRSG OPSITE POSTOP 4X10 (GAUZE/BANDAGES/DRESSINGS) IMPLANT
DRSG OPSITE POSTOP 4X6 (GAUZE/BANDAGES/DRESSINGS) IMPLANT
DRSG OPSITE POSTOP 4X8 (GAUZE/BANDAGES/DRESSINGS) IMPLANT
DRSG PAD ABDOMINAL 8X10 ST (GAUZE/BANDAGES/DRESSINGS) ×6 IMPLANT
DRSG TEGADERM 2-3/8X2-3/4 SM (GAUZE/BANDAGES/DRESSINGS) ×30 IMPLANT
DRSG TEGADERM 4X4.75 (GAUZE/BANDAGES/DRESSINGS) ×6 IMPLANT
ELECT PENCIL ROCKER SW 15FT (MISCELLANEOUS) ×6 IMPLANT
ELECT REM PT RETURN 15FT ADLT (MISCELLANEOUS) ×12 IMPLANT
ENDOLOOP SUT PDS II  0 18 (SUTURE)
ENDOLOOP SUT PDS II 0 18 (SUTURE) IMPLANT
EVACUATOR SILICONE 100CC (DRAIN) ×12 IMPLANT
EXTRACTOR STONE 1.7FRX115CM (UROLOGICAL SUPPLIES) IMPLANT
FIBER LASER FLEXIVA 1000 (UROLOGICAL SUPPLIES) IMPLANT
FIBER LASER FLEXIVA 365 (UROLOGICAL SUPPLIES) IMPLANT
FIBER LASER FLEXIVA 550 (UROLOGICAL SUPPLIES) IMPLANT
FIBER LASER TRAC TIP (UROLOGICAL SUPPLIES) IMPLANT
GAUZE SPONGE 2X2 8PLY STRL LF (GAUZE/BANDAGES/DRESSINGS) ×4 IMPLANT
GAUZE SPONGE 4X4 12PLY STRL (GAUZE/BANDAGES/DRESSINGS) ×6 IMPLANT
GAUZE XEROFORM 1X8 LF (GAUZE/BANDAGES/DRESSINGS) ×6 IMPLANT
GLOVE BIO SURGEON STRL SZ 6 (GLOVE) ×24 IMPLANT
GLOVE BIO SURGEON STRL SZ 6.5 (GLOVE) IMPLANT
GLOVE BIO SURGEON STRL SZ7.5 (GLOVE) ×18 IMPLANT
GLOVE BIO SURGEONS STRL SZ 6.5 (GLOVE)
GLOVE BIOGEL M STRL SZ7.5 (GLOVE) ×6 IMPLANT
GLOVE INDICATOR 8.0 STRL GRN (GLOVE) ×18 IMPLANT
GOWN STRL REUS W/ TWL LRG LVL3 (GOWN DISPOSABLE) ×8 IMPLANT
GOWN STRL REUS W/TWL LRG LVL3 (GOWN DISPOSABLE) ×16 IMPLANT
GOWN STRL REUS W/TWL XL LVL3 (GOWN DISPOSABLE) ×30 IMPLANT
GRASPER SUT TROCAR 14GX15 (MISCELLANEOUS) ×6 IMPLANT
GUIDEWIRE ANG ZIPWIRE 038X150 (WIRE) ×6 IMPLANT
GUIDEWIRE STR DUAL SENSOR (WIRE) IMPLANT
HOLDER FOLEY CATH W/STRAP (MISCELLANEOUS) ×12 IMPLANT
IRRIG SUCT STRYKERFLOW 2 WTIP (MISCELLANEOUS) ×6
IRRIGATION SUCT STRKRFLW 2 WTP (MISCELLANEOUS) ×4 IMPLANT
IV NS 1000ML (IV SOLUTION) ×2
IV NS 1000ML BAXH (IV SOLUTION) ×4 IMPLANT
KIT PROCEDURE DA VINCI SI (MISCELLANEOUS)
KIT PROCEDURE DVNC SI (MISCELLANEOUS) IMPLANT
LIGASURE IMPACT 36 18CM CVD LR (INSTRUMENTS) IMPLANT
MANIFOLD NEPTUNE II (INSTRUMENTS) ×6 IMPLANT
MANIPULATOR UTERINE 4.5 ZUMI (MISCELLANEOUS) ×12 IMPLANT
NEEDLE HYPO 22GX1.5 SAFETY (NEEDLE) IMPLANT
NEEDLE INSUFFLATION 14GA 120MM (NEEDLE) ×6 IMPLANT
NEEDLE SPNL 18GX3.5 QUINCKE PK (NEEDLE) IMPLANT
OBTURATOR OPTICAL STANDARD 8MM (TROCAR) ×2
OBTURATOR OPTICAL STND 8 DVNC (TROCAR) ×4
OBTURATOR OPTICALSTD 8 DVNC (TROCAR) ×4 IMPLANT
PACK CARDIOVASCULAR III (CUSTOM PROCEDURE TRAY) ×6 IMPLANT
PACK COLON (CUSTOM PROCEDURE TRAY) ×6 IMPLANT
PACK CYSTO (CUSTOM PROCEDURE TRAY) ×6 IMPLANT
PACK ROBOT GYN CUSTOM WL (TRAY / TRAY PROCEDURE) IMPLANT
PAD POSITIONING PINK XL (MISCELLANEOUS) ×12 IMPLANT
PORT ACCESS TROCAR AIRSEAL 12 (TROCAR) IMPLANT
PORT ACCESS TROCAR AIRSEAL 5M (TROCAR)
PORT LAP GEL ALEXIS MED 5-9CM (MISCELLANEOUS) ×6 IMPLANT
POUCH SPECIMEN RETRIEVAL 10MM (ENDOMECHANICALS) IMPLANT
PROTECTOR NERVE ULNAR (MISCELLANEOUS) ×12 IMPLANT
RETRACTOR LONE STAR DISPOSABLE (INSTRUMENTS) ×6 IMPLANT
RETRACTOR STAY HOOK 5MM (MISCELLANEOUS) ×6 IMPLANT
RTRCTR WOUND ALEXIS 18CM MED (MISCELLANEOUS)
SCISSORS LAP 5X35 DISP (ENDOMECHANICALS) ×6 IMPLANT
SEAL CANN UNIV 5-8 DVNC XI (MISCELLANEOUS) ×32 IMPLANT
SEAL XI 5MM-8MM UNIVERSAL (MISCELLANEOUS) ×16
SEALER VESSEL DA VINCI XI (MISCELLANEOUS) ×2
SEALER VESSEL EXT DVNC XI (MISCELLANEOUS) ×4 IMPLANT
SET TRI-LUMEN FLTR TB AIRSEAL (TUBING) IMPLANT
SHEATH URETERAL 12FRX28CM (UROLOGICAL SUPPLIES) IMPLANT
SHEATH URETERAL 12FRX35CM (MISCELLANEOUS) IMPLANT
SLEEVE ADV FIXATION 5X100MM (TROCAR) IMPLANT
SOLUTION ELECTROLUBE (MISCELLANEOUS) ×6 IMPLANT
SPONGE DRAIN TRACH 4X4 STRL 2S (GAUZE/BANDAGES/DRESSINGS) ×6 IMPLANT
SPONGE GAUZE 2X2 STER 10/PKG (GAUZE/BANDAGES/DRESSINGS) ×2
STAPLER 45 BLU RELOAD XI (STAPLE) IMPLANT
STAPLER 45 BLUE RELOAD XI (STAPLE)
STAPLER 45 GREEN RELOAD XI (STAPLE)
STAPLER 45 GRN RELOAD XI (STAPLE) IMPLANT
STAPLER CANNULA SEAL DVNC XI (STAPLE) ×4 IMPLANT
STAPLER CANNULA SEAL XI (STAPLE) ×2
STAPLER PROXIMATE 75MM BLUE (STAPLE) ×6 IMPLANT
STAPLER SHEATH (SHEATH) ×2
STAPLER SHEATH ENDOWRIST DVNC (SHEATH) ×4 IMPLANT
SURGIFLO W/THROMBIN 8M KIT (HEMOSTASIS) IMPLANT
SURGILUBE 2OZ TUBE FLIPTOP (MISCELLANEOUS) ×6 IMPLANT
SUT ETHILON 2 0 PS N (SUTURE) ×24 IMPLANT
SUT MNCRL AB 4-0 PS2 18 (SUTURE) ×6 IMPLANT
SUT PDS AB 1 CTX 36 (SUTURE) IMPLANT
SUT PDS AB 1 TP1 96 (SUTURE) IMPLANT
SUT PROLENE 0 CT 2 (SUTURE) IMPLANT
SUT PROLENE 2 0 KS (SUTURE) IMPLANT
SUT PROLENE 2 0 SH DA (SUTURE) IMPLANT
SUT SILK 0 SH 30 (SUTURE) ×6 IMPLANT
SUT SILK 2 0 (SUTURE)
SUT SILK 2 0 SH CR/8 (SUTURE) IMPLANT
SUT SILK 2-0 18XBRD TIE 12 (SUTURE) IMPLANT
SUT SILK 3 0 (SUTURE) ×2
SUT SILK 3 0 SH CR/8 (SUTURE) ×6 IMPLANT
SUT SILK 3-0 18XBRD TIE 12 (SUTURE) ×4 IMPLANT
SUT V-LOC BARB 180 2/0GR6 GS22 (SUTURE) ×6
SUT VIC AB 0 CT1 27 (SUTURE) ×4
SUT VIC AB 0 CT1 27XBRD ANTBC (SUTURE) ×8 IMPLANT
SUT VIC AB 2-0 CT2 27 (SUTURE) ×18 IMPLANT
SUT VIC AB 2-0 SH 18 (SUTURE) ×18 IMPLANT
SUT VIC AB 2-0 SH 27 (SUTURE) ×2
SUT VIC AB 2-0 SH 27X BRD (SUTURE) ×4 IMPLANT
SUT VIC AB 3-0 SH 18 (SUTURE) ×12 IMPLANT
SUT VIC AB 3-0 SH 27 (SUTURE)
SUT VIC AB 3-0 SH 27XBRD (SUTURE) IMPLANT
SUT VICRYL 0 27 CT2 27 ABS (SUTURE) ×36 IMPLANT
SUT VICRYL 0 UR6 27IN ABS (SUTURE) ×6 IMPLANT
SUTURE V-LC BRB 180 2/0GR6GS22 (SUTURE) ×4 IMPLANT
SYR 10ML LL (SYRINGE) ×6 IMPLANT
SYR CONTROL 10ML LL (SYRINGE) IMPLANT
SYS LAPSCP GELPORT 120MM (MISCELLANEOUS)
SYSTEM LAPSCP GELPORT 120MM (MISCELLANEOUS) IMPLANT
TAPE UMBILICAL COTTON 1/8X30 (MISCELLANEOUS) ×6 IMPLANT
TOWEL OR NON WOVEN STRL DISP B (DISPOSABLE) ×12 IMPLANT
TRAP SPECIMEN MUCOUS 40CC (MISCELLANEOUS) IMPLANT
TRAY FOLEY MTR SLVR 16FR STAT (SET/KITS/TRAYS/PACK) ×6 IMPLANT
TROCAR ADV FIXATION 5X100MM (TROCAR) ×6 IMPLANT
TUBE FEEDING 8FR 16IN STR KANG (MISCELLANEOUS) IMPLANT
TUBING CONNECTING 10 (TUBING) ×15 IMPLANT
TUBING CONNECTING 10' (TUBING) ×3
TUBING INSUFFLATION 10FT LAP (TUBING) ×6 IMPLANT
UNDERPAD 30X30 (UNDERPADS AND DIAPERS) ×6 IMPLANT
WATER STERILE IRR 1000ML POUR (IV SOLUTION) ×6 IMPLANT

## 2018-09-01 NOTE — Progress Notes (Signed)
Pt's B/P : 93/62,85/57,80/52. Notified to on call MD  Concha Se. Received order to administer   57ml NS  IV bolus. Pt is receiving NS bolus now. Will recheck V/S after finish current bolus normal saline . Will continue to monitor.

## 2018-09-01 NOTE — H&P (Signed)
CC: Referral by Dr. Benay Spice for rectal cancer - here for surgery today  HPI: Ms. Reder is a very pleasant 66yoF with hx of anxiety whom presented to digestive health in [place] with what she thought was IVS/Crohn's disease related symptoms-diarrhea and some rectal bleeding. This was in August 2019. She was evaluated and underwent colonoscopy and was found to have a 3.5cm mass in her distal rectum which was biopsied and returned adenocarcinoma at least intramucosal in a background of high-grade dysplasia. The remaining of the colon appeared normal. No loss of mismatch repair protein expression was noted.  She underwent CT scans on 02/10/18 which revealed an eccentric anterior rectal wall thickening and several pulmonary nodules measuring up to 9 mm concerning for metastatic disease. Also had changes consistent with emphysema. There are multiple perirectal lymph nodes measuring up to 6 mm in size. She underwent pelvic MRI 02/20/18 which demonstrated the tumor to be invading the posterior wall of vagina and was read as [id number]. There is no noted sphincter involvement on that exam. It was measured to be at 2 cm from the anal verge by MRI but this measurement was to the external most portion of the anal verge.  She was treated by medical oncology at [facility] and met with colorectal, Dr. Hilliard Clark - started on FOLFIRI 03/01/18 and completed 5 cycles with them but subsequently decided to transfer care to [facility] where she has been seeing Dr. Benay Spice who completed cycle 6 with her 05/11/18. She has tolerated this therapy reasonably well. She has since been presented at our multidisciplinary tumor board and the consensus recommendation following additional imaging was that she has exhibited a great response with regards to her metastatic disease to the point that S PRT therapy is no longer even being considered. Given her response, she was referred for surgical evaluation. She then completed  chemoradiation therapy starting 06/12/18 for 5 1/2 weeks.She does have persistent dyspareunia.  She was seen back towards the end of chemoXRT and was still having anorectal and vaginal pain. She's had no bleeding from her anus or vagina.   She states she is ready today for surgery. She denies any changes in her health or history since being seen in my office.  Has been marked by our WOCN for stoma as well   PMH: Anxiety-when necessary alprazolam  PSH: Removal of her adenoids and port placement for chemotherapy  FHx: Denies known FHx of malignancy  Social: Denies use of tobacco/EtOH/drugs. She previously has a history of tobacco abuse but stopped August of this year. She has not had a drink in 5 years. She is currently unemployed  ROS: A comprehensive 10 system review of systems was completed with the patient and pertinent findings as noted above.  Past Medical History:  Diagnosis Date  . Adenocarcinoma ([place]) 02/2018  . Anxiety   . Cancer ([place]) 02/10/2018   Stage 4 Colorectal Cancer, spot on her lungs  . Migraine     Past Surgical History:  Procedure Laterality Date  . ADENOIDECTOMY    . TONSILLECTOMY      Family History  Problem Relation Age of Onset  . Breast cancer Maternal Aunt   . Throat cancer Cousin   . Cancer Cousin   . Throat cancer Cousin   . Lung cancer Paternal Uncle     Social:  reports that she quit smoking about 6 months ago. She quit after 30.00 years of use. She has never used smokeless tobacco. She reports previous alcohol use. She  reports that she does not use drugs.  Allergies:  Allergies  Allergen Reactions  . Sulfa Antibiotics     Patient unaware of side effects, she was told when she was a child that she was allergic    Medications: I have reviewed the patient's current medications.  Results for orders placed or performed during the hospital encounter of 08/30/18 (from the past 48 hour(s))  APTT     Status: Abnormal   Collection  Time: 08/30/18  2:56 PM  Result Value Ref Range   aPTT 37 (H) 24 - 36 seconds    Comment:        IF BASELINE aPTT IS ELEVATED, SUGGEST PATIENT RISK ASSESSMENT BE USED TO DETERMINE APPROPRIATE ANTICOAGULANT THERAPY. Performed at [facility], [place] [address], [place], [place] [address]   CBC WITH DIFFERENTIAL     Status: Abnormal   Collection Time: 08/30/18  2:56 PM  Result Value Ref Range   WBC 9.5 4.0 - 10.5 K/uL   RBC 4.74 3.87 - 5.11 MIL/uL   Hemoglobin 9.8 (L) 12.0 - 15.0 g/dL   HCT 34.0 (L) 36.0 - 46.0 %   MCV 71.7 (L) 80.0 - 100.0 fL   MCH 20.7 (L) 26.0 - 34.0 pg   MCHC 28.8 (L) 30.0 - 36.0 g/dL   RDW 15.0 11.5 - 15.5 %   Platelets 305 150 - 400 K/uL   nRBC 0.0 0.0 - 0.2 %   Neutrophils Relative % 82 %   Neutro Abs 7.9 (H) 1.7 - 7.7 K/uL   Lymphocytes Relative 8 %   Lymphs Abs 0.8 0.7 - 4.0 K/uL   Monocytes Relative 7 %   Monocytes Absolute 0.6 0.1 - 1.0 K/uL   Eosinophils Relative 2 %   Eosinophils Absolute 0.2 0.0 - 0.5 K/uL   Basophils Relative 0 %   Basophils Absolute 0.0 0.0 - 0.1 K/uL   Immature Granulocytes 1 %   Abs Immature Granulocytes 0.05 0.00 - 0.07 K/uL    Comment: Performed at [facility], [place] [address] [place], [place] [address]  Comprehensive metabolic panel     Status: Abnormal   Collection Time: 08/30/18  2:56 PM  Result Value Ref Range   Sodium 137 135 - 145 mmol/L   Potassium 3.7 3.5 - 5.1 mmol/L   Chloride 101 98 - 111 mmol/L   CO2 28 22 - 32 mmol/L   Glucose, Bld 104 (H) 70 - 99 mg/dL   BUN 11 6 - 20 mg/dL   Creatinine, Ser 0.44 0.44 - 1.00 mg/dL   Calcium 9.0 8.9 - 10.3 mg/dL   Total Protein 7.4 6.5 - 8.1 g/dL   Albumin 4.2 3.5 - 5.0 g/dL   AST 16 15 - 41 U/L   ALT 9 0 - 44 U/L   Alkaline Phosphatase 102 38 - 126 U/L   Total Bilirubin 0.7 0.3 - 1.2 mg/dL   GFR calc non Af Amer >60 >60 mL/min   GFR calc Af Amer >60 >60 mL/min   Anion gap 8 5 - 15    Comment: Performed at [facility], [place] [address], [place], [place] [address]  Hemoglobin A1c     Status: None   Collection Time: 08/30/18  2:56 PM  Result Value Ref Range   Hgb A1c MFr Bld 4.9 4.8 - 5.6 %    Comment: (NOTE)         Prediabetes: 5.7 - 6.4  Diabetes: >6.4         Glycemic control for adults with diabetes: <7.0    Mean Plasma Glucose 94 mg/dL    Comment: (NOTE) Performed At: [facility] [place], [place] [id number] Rush Farmer MD [id number]   Protime-INR     Status: None   Collection Time: 08/30/18  2:56 PM  Result Value Ref Range   Prothrombin Time 13.9 11.4 - 15.2 seconds   INR 1.08     Comment: Performed at [facility], [place] [address], [place], [place] [address]  Type and screen     Status: None   Collection Time: 08/30/18  2:56 PM  Result Value Ref Range   ABO/RH(D) A POS    Antibody Screen NEG    Sample Expiration 09/13/2018    Extend sample reason      NO TRANSFUSIONS OR PREGNANCY IN THE PAST 3 MONTHS Performed at [facility], [place] [address], [place], [place] [address]   ABO/Rh     Status: None   Collection Time: 08/30/18  2:56 PM  Result Value Ref Range   ABO/RH(D)      A POS Performed at [facility], [place] [address] [place], [place] [address]     No results found.  ROS - all of the below systems have been reviewed with the patient and positives are indicated with bold text General: chills, fever or night sweats Eyes: blurry vision or double vision ENT: epistaxis or sore throat Allergy/Immunology: itchy/watery eyes or nasal congestion Hematologic/Lymphatic: bleeding problems, blood clots or swollen lymph nodes Endocrine: temperature intolerance or unexpected weight changes Breast: new or changing breast lumps or nipple discharge Resp: cough, shortness of breath, or wheezing CV: chest pain or dyspnea on exertion GI: as per HPI GU: dysuria, trouble voiding, or  hematuria MSK: joint pain or joint stiffness Neuro: TIA or stroke symptoms Derm: pruritus and skin lesion changes Psych: anxiety and depression  PE Blood pressure (!) 141/80, pulse 77, temperature 97.7 F (36.5 C), temperature source Oral, resp. rate 18, height 5' 7"  (1.702 m), weight 57.6 kg, SpO2 95 %. Constitutional: NAD; conversant; no deformities Eyes: Moist conjunctiva; no lid lag; anicteric; PERRL Neck: Trachea midline; no thyromegaly Lungs: Normal respiratory effort; no tactile fremitus CV: RRR; no palpable thrills; no pitting edema GI: Abd soft, NT/ND; stoma markins in place; no palpable hepatosplenomegaly MSK: Normal gait; no clubbing/cyanosis Psychiatric: Appropriate affect; alert and oriented x3 Lymphatic: No palpable cervical or axillary lymphadenopathy  Results for orders placed or performed during the hospital encounter of 08/30/18 (from the past 48 hour(s))  APTT     Status: Abnormal   Collection Time: 08/30/18  2:56 PM  Result Value Ref Range   aPTT 37 (H) 24 - 36 seconds    Comment:        IF BASELINE aPTT IS ELEVATED, SUGGEST PATIENT RISK ASSESSMENT BE USED TO DETERMINE APPROPRIATE ANTICOAGULANT THERAPY. Performed at [facility], [place] [address] [place], [place] [address]   CBC WITH DIFFERENTIAL     Status: Abnormal   Collection Time: 08/30/18  2:56 PM  Result Value Ref Range   WBC 9.5 4.0 - 10.5 K/uL   RBC 4.74 3.87 - 5.11 MIL/uL   Hemoglobin 9.8 (L) 12.0 - 15.0 g/dL   HCT 34.0 (L) 36.0 - 46.0 %   MCV 71.7 (L) 80.0 - 100.0 fL   MCH 20.7 (L) 26.0 - 34.0 pg   MCHC 28.8 (L)  30.0 - 36.0 g/dL   RDW 15.0 11.5 - 15.5 %   Platelets 305 150 - 400 K/uL   nRBC 0.0 0.0 - 0.2 %   Neutrophils Relative % 82 %   Neutro Abs 7.9 (H) 1.7 - 7.7 K/uL   Lymphocytes Relative 8 %   Lymphs Abs 0.8 0.7 - 4.0 K/uL   Monocytes Relative 7 %   Monocytes Absolute 0.6 0.1 - 1.0 K/uL   Eosinophils Relative 2 %   Eosinophils Absolute 0.2 0.0 - 0.5 K/uL    Basophils Relative 0 %   Basophils Absolute 0.0 0.0 - 0.1 K/uL   Immature Granulocytes 1 %   Abs Immature Granulocytes 0.05 0.00 - 0.07 K/uL    Comment: Performed at [facility], [place] [address] [place], [place] [address]  Comprehensive metabolic panel     Status: Abnormal   Collection Time: 08/30/18  2:56 PM  Result Value Ref Range   Sodium 137 135 - 145 mmol/L   Potassium 3.7 3.5 - 5.1 mmol/L   Chloride 101 98 - 111 mmol/L   CO2 28 22 - 32 mmol/L   Glucose, Bld 104 (H) 70 - 99 mg/dL   BUN 11 6 - 20 mg/dL   Creatinine, Ser 0.44 0.44 - 1.00 mg/dL   Calcium 9.0 8.9 - 10.3 mg/dL   Total Protein 7.4 6.5 - 8.1 g/dL   Albumin 4.2 3.5 - 5.0 g/dL   AST 16 15 - 41 U/L   ALT 9 0 - 44 U/L   Alkaline Phosphatase 102 38 - 126 U/L   Total Bilirubin 0.7 0.3 - 1.2 mg/dL   GFR calc non Af Amer >60 >60 mL/min   GFR calc Af Amer >60 >60 mL/min   Anion gap 8 5 - 15    Comment: Performed at [facility], [place] [address], [place], [place] [address]  Hemoglobin A1c     Status: None   Collection Time: 08/30/18  2:56 PM  Result Value Ref Range   Hgb A1c MFr Bld 4.9 4.8 - 5.6 %    Comment: (NOTE)         Prediabetes: 5.7 - 6.4         Diabetes: >6.4         Glycemic control for adults with diabetes: <7.0    Mean Plasma Glucose 94 mg/dL    Comment: (NOTE) Performed At: [facility] [place], [place] [id number] Rush Farmer MD [id number]   Protime-INR     Status: None   Collection Time: 08/30/18  2:56 PM  Result Value Ref Range   Prothrombin Time 13.9 11.4 - 15.2 seconds   INR 1.08     Comment: Performed at [facility], [place] [address] [place], [place] [address]  Type and screen     Status: None   Collection Time: 08/30/18  2:56 PM  Result Value Ref Range   ABO/RH(D) A POS    Antibody Screen NEG    Sample Expiration 09/13/2018    Extend sample reason      NO TRANSFUSIONS OR PREGNANCY IN THE PAST 3  MONTHS Performed at [facility], [place] [address], [place], [place] [address]   ABO/Rh     Status: None   Collection Time: 08/30/18  2:56 PM  Result Value Ref Range   ABO/RH(D)      A POS Performed at [facility], [place] [address] [place] [address], [place],  [address]  A/P: Ms. Yount is a very pleasant 40yoF with hx of anxiety here today for follow-up evaluation of rectal cancer - [phone] - s/p 6 cycles FOLFIRI with great response to the metastatic lesions in her chest to the point where further treatment is being deemed to be unlikely needed and therefore has been referred for surgical evaluation. She then completed neoadjuvant chemotherapy XRT with Xeloda and is here today for surgery  -The anatomy and physiology of the GI tract was discussed at length with the patient again today. The pathophysiology of rectal cancer was discussed at length. -Repeat imaging CT C/A/P showed favorable findings and MDT plan was to proceed with surgery -We discussed options going forward from a surgical standpoint would be abdominoperineal resection given the location of the tumor in relation to the dentate and her inability to get a clear 1 cm distal margin without removing the anal sphincters. We discussed possible flexible sigmoidoscopy if necessary. We discussed that this would also involve a posterior vaginectomy to be performed by my gyn/onc colleague Dr. Denman George. We discussed minimally invasive (robotic and laparoscopic) as well as possible open techniques. We discussed permanent end colostomy. We also discussed cysto/stents to be performed by urology. -The planned procedure, material risks (including, but not limited to, pain, bleeding, infection, scarring, need for blood transfusion, damage to surrounding structures- blood vessels/nerves/viscus/organs, damage to ureter, urine leak, leak from anastomosis, need for additional procedures, vaginal stenosis, DVT/PE, hernia, recurrence  of cancer (particularly given history of lung metastasis in past that have responded favorably), pneumonia, heart attack, stroke, death) benefits and alternatives to surgery were discussed at length. The patient's questions were answered to her satisfaction, she voiced understanding and elected to proceed with surgery. Additionally, we discussed typical postoperative expectations and the recovery process. We discussed that her colostomy would be permanent as well.  Sharon Mt. Dema Severin, M.D. General and Colorectal Surgery [facility]

## 2018-09-01 NOTE — Op Note (Signed)
OPERATIVE NOTE 09/01/18  Surgeon: Donaciano Eva   Assistants: Dr Leighton Ruff and Dr Nadeen Landau   Anesthesia: General endotracheal anesthesia  ASA Class: 3   Pre-operative Diagnosis: rectal cancer  Post-operative Diagnosis: same  Operation: Robotic-assisted radical vaginectomy  Anesthesia: GET  Urine Output: not separately recorded  Operative Findings:  : palpable 3cm tumor in right rectovaginal space, no visible tumor on vaginal mucosa.  Estimated Blood Loss:  less than 50 mL      Total IV Fluids: not discretely recorded for this portion of procedure         Specimens: PATHOLOGY anus with rectum and sigmoid colon and upper vagina. New true vaginal margin with long marking stitch at new true right vaginal margin and short stitch at distal vaginal margin         Complications:  None; patient tolerated the procedure well.         Disposition: PACU - hemodynamically stable.  Procedure Details  The patient was seen in the Holding Room. The risks, benefits, complications, treatment options, and expected outcomes were discussed with the patient.  The patient concurred with the proposed plan, giving informed consent.  The site of surgery properly noted/marked. The patient was identified as Camron Essman and the procedure verified. A Time Out was held and the above information confirmed. The robotic assisted APR proceeded with Dr Dema Severin as lead surgeon (please refer to his separate note). After induction of anesthesia, the patient was draped and prepped in the usual sterile manner. Pt was placed in supine position after anesthesia and draped and prepped in the usual sterile manner. The abdominal drape was placed after the CholoraPrep had been allowed to dry for 3 minutes.  Her arms were tucked to her side with all appropriate precautions.  The patient was placed in the semi-lithotomy position in [place].  The perineum was prepped with Betadine. The patient was then  prepped. Foley catheter was placed.  A sterile speculum was placed in the vagina.  The cervix was grasped with a single-tooth tenaculum and dilated with Kennon Rounds dilators.  The ZUMI uterine manipulator was placed without difficulty.   The surgery was commenced by Dr Dema Severin who placed ports, docked the robot and performed the majority of the APR procedure with an extensive dissection of the posterior and lateral tissue planes.   Once the rectum had been freed from its posterior and lateral attachments and the ureters (which had been stented at the beginning of the procedure) were identified, the anterior dissection proceeded. The left lateral rectovaginal septum was developed to the level of the levator plate with careful use of bipolar and scissors. The left lateral paravaginal attachments were separated from the lateral sidewall to ensure adequate margins around the tumor. The plane was not developed between the right paravaginal tissues and rectum as this was the tissue containing tumor.   A colpotomy was made at the proximal vagina and extended distally to the levator plate in the midline of the vagina. The lateral vaginal incision was made on the right vaginal sidewall lateral and anterior to the tumor. In doing so, a tongue of vaginal mucosa was separated and kept in situ with the rectal specimen. The uterine manipulator was removed from the uterus. The remainder of the separation of the vaginal specimen took place from the perineal approach after Dr Dema Severin incised a peri-anal incision and extended this incision to meet the vaginal incisions from above.  Dr Dema Severin stapled across the sigmoid colon (please  refer to his separate operative note). This facilitated delivery of the specimen through the vagina. After the specimen was removed it was appreciated that the proximal right margin was close to the underlying palpable tumor (<1cm) though with no visible tumor present on the margins. Therefore, an additional  margin was resected from the right proximal (upper) vagina. This was marked at the mid portion of the specimen (cranio-caudad orientation) with a long marking suture to represent the new true right lateral vaginal margin. The distal edge of this specimen was marked with a short suture.   The vaginal mucosa was reapproximated at the proximal vaginal cuff with 2-0 barbed suture. There was some narrowing of the vagina but it still accepted a digit to the level of the cervix.  The distal vagina was slightly everted to widen the introitus and this was reapproximated to the perineal body (that Dr Dema Severin had reconstructed) with 2-0 vicryl suture.  Dr's White and Marcello Moores completed the procedure with formation of a colostomy.  Donaciano Eva, MD

## 2018-09-01 NOTE — Op Note (Signed)
09/01/2018  12:49 PM  PATIENT:  Tina Morales  53 y.o. female  Patient Care Team: Medicine, [facility] Family as PCP - General (Family Medicine)  PRE-OPERATIVE DIAGNOSIS:  Distal locally advanced rectal cancer  POST-OPERATIVE DIAGNOSIS:  Same  PROCEDURE:   1. Robotic-assisted abdominoperineal resection with end colostomy 2. Bilateral transversus abdominus plane blocks   3. Robotic-assisted radical vaginectomy (Dr. Denman George) 4. Cystoscopy/stents (Dr. Tresa Moore)  SURGEON:  Sharon Mt. Meggie Laseter, MD  ASSISTANT: Tina Ruff, MD  ANESTHESIA:   general  COUNTS:  Sponge, needle and instrument counts were reported correct x2 at the conclusion of the operation.  EBL: 50 cc  DRAINS: 43 Fr round blake drain left draining pelvis  SPECIMEN: Anus, rectum and sigmoid colon en bloc  COMPLICATIONS: None  FINDINGS: Locally advanced distal rectal cancer involving posterior wall of vagina. Abdominoperineal resection performed with en bloc posterior vaginectomy (Dr. Denman George). Additional vaginal wall was submitted as well as final right lateral vaginal wall margin; this was oriented by Dr. Denman George.  DISPOSITION: PACU in satisfactory condition  INDICATION: Tina Morales is a very pleasant 61yoF with hx of anxiety whom presented to digestive health in [place] with what she thought was IVS/Crohn's disease related symptoms-diarrhea and some rectal bleeding. This was in August 2019. She was evaluated and underwent colonoscopy and was found to have a 3.5cm mass in her distal rectum which was biopsied and returned adenocarcinoma at least intramucosal in a background of high-grade dysplasia. The remaining of the colon appeared normal. No loss of mismatch repair protein expression was noted.  She underwent CT scans on 02/10/18 which revealed an eccentric anterior rectal wall thickening and several pulmonary nodules measuring up to 9 mm concerning for metastatic disease. Also had changes consistent with  emphysema. There are multiple perirectal lymph nodes measuring up to 6 mm in size. She underwent pelvic MRI 02/20/18 which demonstrated the tumor to be invading the posterior wall of vagina and was read as [id number]. There is no noted sphincter involvement on that exam. It was measured to be at 2 cm from the anal verge by MRI but this measurement was to the external most portion of the anal verge.  She was treated by medical oncology at [facility] and met with colorectal, Dr. Hilliard Clark - started on FOLFIRI 03/01/18 and completed 5 cycles with them but subsequently decided to transfer care to [facility] where she has been seeing Dr. Benay Spice who completed cycle 6 with her 05/11/18. She has tolerated this therapy reasonably well. She has since been presented at our multidisciplinary tumor board and the consensus recommendation following additional imaging was that she has exhibited a great response with regards to her metastatic disease to the point that S PRT therapy is no longer even being considered. Given her response, she was referred for surgical evaluation. She then completed chemoradiation therapy starting 06/12/18 for 5 1/2 weeks.She does have persistent dyspareunia.  She was seen back towards the end of chemoXRT and was still having anorectal and vaginal pain. She's had no bleeding from her anus or vagina.   On exam, a mass remained in the distal rectum and vagina. Options were discussed with her and she opted to pursue surgery. Please refer to notes elsewhere for details regarding this discussion.  DESCRIPTION: The patient was identified in preop holding and taken to the OR where she was placed on the operating room table. SCDs were placed. General endotracheal anesthesia was induced without difficulty.  She was then positioned in lithotomy with Zenia Resides  stirrups.  Pressure points were then padded.  She was then secured to the operating table by the OR team after sufficient padding using tape  and a pink pad.  She was then prepped and draped in the usual sterile fashion for cystoscopy/stents which were then performed by Dr. Tresa Moore.  Please refer to his dictation for details regarding this portion of the procedure.  She was then prepped and draped for the abdominal/perineal portion of the procedure. A surgical timeout was performed indicating the correct patient, procedure, positioning and need for preoperative antibiotics.   An OG tube was placed by the anesthesia team and placed on suction.  Following this, a stab incision was made at Palmer's point.  A Veress needle was inserted into the peritoneal cavity and intraperitoneal location confirmed in the saline drop test and aspiration.  Pneumoperitoneum was then established to a maximum pressure of 15 mmHg using CO2.  An 8 mm robotic trocar was then carefully placed in the right mid abdomen.  Laparoscope was inserted and intraperitoneal inspection revealed no evidence of complications related to the Veress needle placement or trocar site.  3 additional 8 mm trochars were placed along a line extending from the right ASIS to the left upper quadrant. A 5 mm assistant port was placed in the right lower quadrant.  All these were placed under direct visualization.  Bilateral transversus abdominis plane blocks were then created using a dilute mixture of 0.25% Marcaine with Exparel.  The patient was then positioned in Trendelenburg with some right side down.  Bowel was then swept out of the pelvis.  The uterus was quite small.  A uterine manipulator had been placed by Dr. Denman George to facilitate with retraction.  The robot was then docked and I then went to the console. Intra-abdominal inspection revealed a normal appearing liver and no evidence of metastatic disease to the peritoneum.  The sigmoid colon was freed from its attachments to the intersigmoid fossa. The rectosigmoid colon was grapsed and retracted anteriorly. The peritoneum overlying the sacral  promontory was incised and the avascular plane entered between the fascia propria of the rectum and the presacral fascia - the total mesorectal excision plane. This plane was developed into the pelvis posteriorly. Following this, the peritoneum was incised cephalad working up and over the IMA pedicle.  The IMA was grasped and gently elevated anteriorly.  Working lateral to the IMA, within the retroperitoneum, the left ureter was clearly identified and protected by dissecting away.  The IMA pedicle was circumferentially dissected with the left ureter down.  Gonadal vessels were identified and also preserved.  After circumferentially dissecting the IMA to approximately 1 cm from its takeoff from the aorta, the IMA was ligated using the vessel sealer.  She had a quite redundant sigmoid as well as descending colon.  The descending colon mesentery was mobilized again working at the medial to lateral approach.  This was done up to the level of Gerota's fascia.  Peritoneal attachments along the descending colon at the Nikhil Osei line of Toldt were also taken down sharply.  Following this, attention was turned back to the pelvic dissection.  The plane posteriorly was developed all the way to the pelvic floor again sharply, staying in the TME plane- presacral fascia down, fascia propria of the rectum up.  Following this, the rectum was mobilized laterally taking the lateral stalks with the vessel sealer.  To complete the dissection, the plane was continued anteriorly.  The peritoneum was incised and the plane of  dissection continued from the lateral approach.  The vagina was swept up.  At a location approximately 1 cm above the location of her cancer, a posterior vaginectomy was performed for the anterior margin of the mesorectal excision.  This was done en bloc.  Dr. Denman George did this robotically.  Please refer to her dictation for details regarding this discussion.  I reexamined the pelvic dissection and ensured that all  attachments have been cleared.  The dissection had terminated below the level of the levator muscles.  Following this, I went below to complete the perineal portion of the procedure.  The anus was closed with a figure of eight suture. Staying outside the sphincter complex, the perianal skin was incised.  Beginning posteriorly, dissection commenced up to level anococcygeal ligament which was incised with electrocautery.  The abdominal portion of the dissection was then met.  Attachments were then "hooked" and taken laterally.  The plane of dissection went out to the levator muscles laterally.  The anterior dissection was completed with the help with Dr. Denman George taking the remaining portion of the perineal body, including the rectovaginal septum.  Specimen was then delivered from the pelvis.  Above the level of the IMA pedicle, the specimen was divided with a GIA stapler.  This was then passed off.  This included the mid and distal portion of the sigmoid colon. The distal pelvis was irrigated and hemostasis verified. I then closed the levator fascia using interrupted 0 vicryl suture. The fascia was palpated and noted to be completely closed. The abdomen was reinsufflated by my partner and the perineum was inspected and noted to be air tight. Following this, Dr. Denman George closed the posterior wall of the vagina. I then closed the perineum in numerous layers using interrupted 2-0 vicryl suture. The skin was approximated with interrupted 2-0 nylon suture in a vertical mattress fashion. The perineum was dressed with xeroform gauze, 4x4 and ABD. I removed the ureteral stents and scrubbed for the abdominal portion.  The site of the colostomy marking was noted. One of the 8 mm ports was placed at this location previously. This was removed. Skin/subcutaneous tissue removed in a wheel. The anterior rectus sheath was incised in a cruciate manner. The rectus spread and under pneumoperitoneum, the peritoneum opened. The staple line  on the distal descending/proximal sigmoid colon was passed. The ostomy site was loose enough to accommodate 2 fingers. The colon was passed through and orientation confirmed such that there was no twisting. The colon was pink and well perfused and under no tension. The pelvis was irrigated and hemostasis verified. A 19 Fr round blake drain was placed through one of the port sites and the trocar removed. The drain was left draining the pelvis. The remaining ports were removed under visualization and hemostatic. The drain was secured with a 2-0 nylon drain stitch. The skin of all trocar sites was closed with 4-0 monocryl subcuticular sutures. The incision sites were dressed with dermabond. The colostomy staple line was removed and the colostomy was then matured after gentle brooking using 3-0 vicryl suture. An appliance was cut to fit and placed.  Mesh underwear were placed to secure the perineal dressing. She was then awakened from general anesthesia, extubated and transferred to PACU in satisfactory condition having tolerated the procedure well.

## 2018-09-01 NOTE — Transfer of Care (Signed)
Immediate Anesthesia Transfer of Care Note  Patient: Tina Morales  Procedure(s) Performed: Procedure(s): XI ROBOTIC ASSISTED ABDOMINOPERINEAL RESECTION WITH END COLOSTOMY (N/A) XI ROBOTIC ASSISTED PARTIAL VAGINECTOMY (Bilateral) CYSTOSCOPY WITH BILATERAL  RETROGRADE PYELOGRAM (Bilateral) CYSTOSCOPY WITH URETERAL COOK CATHETER PLACEMENT (Bilateral)  Patient Location: PACU  Anesthesia Type:General  Level of Consciousness:  sedated, patient cooperative and responds to stimulation  Airway & Oxygen Therapy:Patient Spontanous Breathing and Patient connected to face mask oxgen  Post-op Assessment:  Report given to PACU RN and Post -op Vital signs reviewed and stable  Post vital signs:  Reviewed and stable  Last Vitals:  Vitals:   09/01/18 0544 09/01/18 0606  BP: 112/74 (!) 141/80  Pulse: (!) 110 77  Resp: 18 18  Temp: 37.1 C 36.5 C  SpO2: 599% 68%    Complications: No apparent anesthesia complications

## 2018-09-01 NOTE — Anesthesia Procedure Notes (Signed)
Procedure Name: Intubation Date/Time: 09/01/2018 7:41 AM Performed by: Lavina Hamman, CRNA Pre-anesthesia Checklist: Patient identified, Emergency Drugs available, Suction available and Patient being monitored Patient Re-evaluated:Patient Re-evaluated prior to induction Oxygen Delivery Method: Circle system utilized Preoxygenation: Pre-oxygenation with 100% oxygen Induction Type: IV induction and Cricoid Pressure applied Ventilation: Mask ventilation without difficulty Grade View: Grade I Tube type: Oral Tube size: 7.5 mm Number of attempts: 1 Airway Equipment and Method: Stylet and Oral airway Placement Confirmation: ETT inserted through vocal cords under direct vision,  positive ETCO2 and breath sounds checked- equal and bilateral Secured at: 21 cm Tube secured with: Tape Dental Injury: Teeth and Oropharynx as per pre-operative assessment  Comments: ATOI

## 2018-09-01 NOTE — Interval H&P Note (Signed)
History and Physical Interval Note:  09/01/2018 6:57 AM  Sweta Halseth  has presented today for surgery, with the diagnosis of rectal cancer  The various methods of treatment have been discussed with the patient and family. After consideration of risks, benefits and other options for treatment, the patient has consented to  Procedure(s): XI ROBOTIC ASSISTED POSSIBLE OPEN ABDOMINOPERINEAL RESECTION WITH END COLOSTOMY (N/A) POSTERIOR RADICAL VAGINECTOMY (N/A) POSSIBLE XI ROBOTIC ASSISTED TOTAL HYSTERECTOMY WITH BILATERAL SALPINGO OOPHORECTOMY (Bilateral) CYSTOSCOPY WITH BILATERAL  RETROGRADE PYELOGRAM (Bilateral) CYSTOSCOPY WITH STENT PLACEMENT (N/A) as a surgical intervention .  The patient's history has been reviewed, patient examined, no change in status, stable for surgery.  I have reviewed the patient's chart and labs.  Questions were answered to the patient's satisfaction.     Thereasa Solo

## 2018-09-01 NOTE — Consult Note (Signed)
[place] Nurse ostomy consult note Stoma type/location: LLQ end colostomy Stomal assessment/size: 2" edematous Peristomal assessment: not assessed Treatment options for stomal/peristomal skin: Has 1 piece flat inplace today.  Stoma appears well budded Output None yet Ostomy pouching: 1pc.flat Education provided: None  Patient groggy from surgery.  Emotional support and informed that our team would be right here with her.  Ongoing education in the coming days.  She is appreciative Enrolled patient in [employer] program: No WOC team will follow.  Domenic Moras MSN, RN, FNP-BC CWON Wound, Ostomy, Continence Nurse Pager [phone]

## 2018-09-01 NOTE — Brief Op Note (Signed)
09/01/2018  8:14 AM  PATIENT:  Tina Morales  53 y.o. female  PRE-OPERATIVE DIAGNOSIS:  rectal cancer  POST-OPERATIVE DIAGNOSIS:  * No post-op diagnosis entered *  PROCEDURE:  Cystoscopy with BILATERAL retrogrades and BILATERAL ureteral stent placement  SURGEON:  Surgeon(s) and Role:     Alexis Frock, MD - Primary  PHYSICIAN ASSISTANT:   ASSISTANTS: none   ANESTHESIA:   general  EBL:  minimal   BLOOD ADMINISTERED:none  DRAINS: foley and bilateral ureteral stents to gravity.    LOCAL MEDICATIONS USED:  NONE  SPECIMEN:  No Specimen  DISPOSITION OF SPECIMEN:  N/A  COUNTS:  YES  TOURNIQUET:  * No tourniquets in log *  DICTATION: .Other Dictation: Dictation Number [id number]  PLAN OF CARE: remain OR 2 for extirpative portions of surgery  PATIENT DISPOSITION:  as per above   Delay start of Pharmacological VTE agent (>24hrs) due to surgical blood loss or risk of bleeding: not applicable

## 2018-09-01 NOTE — Anesthesia Postprocedure Evaluation (Signed)
Anesthesia Post Note  Patient: Kimiyah Dellis  Procedure(s) Performed: XI ROBOTIC ASSISTED ABDOMINOPERINEAL RESECTION WITH END COLOSTOMY (N/A Abdomen) XI ROBOTIC ASSISTED PARTIAL VAGINECTOMY (Bilateral Abdomen) CYSTOSCOPY WITH BILATERAL  RETROGRADE PYELOGRAM (Bilateral Ureter) CYSTOSCOPY WITH URETERAL COOK CATHETER PLACEMENT (Bilateral Ureter)     Patient location during evaluation: PACU Anesthesia Type: General Level of consciousness: awake and alert and oriented Pain management: pain level controlled Vital Signs Assessment: post-procedure vital signs reviewed and stable Respiratory status: spontaneous breathing, nonlabored ventilation, respiratory function stable and patient connected to nasal cannula oxygen Cardiovascular status: blood pressure returned to baseline and stable Postop Assessment: no apparent nausea or vomiting Anesthetic complications: no    Last Vitals:  Vitals:   09/01/18 1400 09/01/18 1415  BP: 130/88 121/86  Pulse: 83 90  Resp: 13 12  Temp: 36.8 C   SpO2: 98% 97%    Last Pain:  Vitals:   09/01/18 1415  TempSrc:   PainSc: Asleep                 Ebenezer Mccaskey A.

## 2018-09-01 NOTE — Op Note (Signed)
NAMELADYE, MACNAUGHTON MEDICAL RECORD DQ:[id number] ACCOUNT [id number] DATE OF BIRTH:[birth year] FACILITY: [facility] LOCATION: [facility] PHYSICIAN:Shonia Skilling, MD  OPERATIVE REPORT  DATE OF PROCEDURE:  09/01/2018  PREOPERATIVE DIAGNOSIS:  Advanced rectal cancer.  PROCEDURE: 1.  Cystoscopy, bilateral pyelograms, interpretation. 2.  Insertion of bilateral ureteral stents, externalized.  ESTIMATED BLOOD LOSS:  Nil.  COMPLICATIONS:  None.  SPECIMENS:  None.  FINDINGS: 1.  Unremarkable bilateral pyelograms. 2.  Unremarkable urinary bladder. 3.  Distal placement of bilateral ureteral stents, externalized and connected to [employer] 3-way adapter.  INDICATION:  The patient is a pleasant but unfortunate 53 year old lady with history of locally advanced and oligometastatic rectal cancer.  She is status post neoadjuvant chemoradiation.  She is undergoing a surgical extirpation today with  abdominoperineal resection and likely local vaginal wall excision under the care of the general surgery and GYN oncology teams.  Given the extent of her pelvic cancer, they have requested bilateral ureteral stenting to help aid in the identification and  protection of the ureters.  Her imaging was reviewed, as was her history.  Interview was performed and she was felt to be an acceptable candidate.  Informed consent was obtained and placed in the medical record.  DESCRIPTION OF PROCEDURE:  The patient being identified, procedure being bilateral ureteral stent placement was confirmed.  Procedure time-out was performed.  Antibiotics were administered.  General anesthesia was induced.  The patient was placed into a  low lithotomy position.  A sterile field was created by prepping and draping the patient's vagina, introitus and proximal thighs using iodine.  Cystourethroscopy was performed using a 22-French rigid cystoscope with offset lens.  Inspection of the  urinary bladder revealed no diverticula, calcifications or  papillary lesions.  The left ureteral orifice was cannulated with a 6-French renal catheter, and left retrograde pyelogram was obtained.  Left retrograde pyelogram demonstrated a single left ureter, single-system left kidney.  No filling defects or narrowing noted.  Under fluoroscopic guidance, the open-ended catheter was advanced to the level of the renal pelvis and set aside.  Similarly,  a second 6-French open-ended catheter was used to cannulate the right ureteral orifice, and right retrograde pyelogram was obtained.  Right retrograde pyelogram demonstrated a single right ureter, single-system right kidney.  No filling defects or narrowing noted.  On fluoroscopic guidance, it was advanced to the level of the renal pelvis and set aside.  A Foley catheter was then  placed per urethra straight drain 10 mL sterile water in the balloon, and the Foley catheter and bilateral ureteral externalized stents were connected to a 3-way Goldberg adapter.  The right and left stents were labeled as such.  They were tied to the Foley  catheter using silk x2.  The procedure was then turned over to the general surgery team for the remainder of her surgery today.  LN/NUANCE  D:09/01/2018 T:09/01/2018 JOB:[id number]

## 2018-09-01 NOTE — Anesthesia Procedure Notes (Signed)
Arterial Line Insertion Start/End2/21/2020 7:43 AM Performed by: Mitzie Na, CRNA, CRNA  Patient location: OR. Preanesthetic checklist: patient identified, IV checked, site marked, risks and benefits discussed, surgical consent, monitors and equipment checked, pre-op evaluation, timeout performed and anesthesia consent Patient sedated Left, radial was placed Catheter size: 20 G Hand hygiene performed , maximum sterile barriers used  and Seldinger technique used  Attempts: 1 Procedure performed without using ultrasound guided technique. Following insertion, Biopatch and dressing applied. Post procedure assessment: normal  Patient tolerated the procedure well with no immediate complications.

## 2018-09-01 NOTE — H&P (Signed)
Tina Morales is an 53 y.o. female.    Chief Complaint: Pre-OP Bilateral Ureteral Stents / Colorectal Cancer Excision  HPI:   1 - Locally Advanced / Metastatic Rectal Cancer - s/p neoadjuvant chemo prior to planned APR / local vaginal resection for rectal cancer. Prior imaging w/o GU anomaly x several, but area of tumor clearly near distal ureters.  Today " Tina Morales " is seen for peri-op ureteral stenting to help in ureteral identification / protection during APR. Cr <1.    Past Medical History:  Diagnosis Date  . Adenocarcinoma ([place]) 02/2018  . Anxiety   . Cancer ([place]) 02/10/2018   Stage 4 Colorectal Cancer, spot on her lungs  . Migraine     Past Surgical History:  Procedure Laterality Date  . ADENOIDECTOMY    . TONSILLECTOMY      Family History  Problem Relation Age of Onset  . Breast cancer Maternal Aunt   . Throat cancer Cousin   . Cancer Cousin   . Throat cancer Cousin   . Lung cancer Paternal Uncle    Social History:  reports that she quit smoking about 6 months ago. She quit after 30.00 years of use. She has never used smokeless tobacco. She reports previous alcohol use. She reports that she does not use drugs.  Allergies:  Allergies  Allergen Reactions  . Sulfa Antibiotics     Patient unaware of side effects, she was told when she was a child that she was allergic    Medications Prior to Admission  Medication Sig Dispense Refill  . ALPRAZolam (XANAX) 0.5 MG tablet Take 1 tablet (0.5 mg total) by mouth 2 (two) times daily as needed. (Patient taking differently: Take 0.5 mg by mouth at bedtime. ) 60 tablet 0  . ferrous sulfate 325 (65 FE) MG EC tablet Take 1 tablet (325 mg total) by mouth 2 (two) times daily with a meal.  3  . lidocaine-prilocaine (EMLA) cream Apply 1 application topically as needed.    . magic mouthwash SOLN Take 5-10 mLs by mouth 4 (four) times daily as needed for mouth pain. 240 mL 1  . metroNIDAZOLE (FLAGYL) 500 MG tablet Take 500 mg by mouth 3  (three) times daily. ONLY T HE DAY BEFORE PROCEDURE    . Multiple Vitamins-Minerals (MULTIVITAMIN WITH MINERALS) tablet Take 1 tablet by mouth daily.    Marland Kitchen neomycin (MYCIFRADIN) 500 MG tablet Take 500 mg by mouth 3 (three) times daily. ONLY THE DAY BEFORE PROCEDURE    . omeprazole (PRILOSEC) 20 MG capsule Take 20 mg by mouth daily.    Marland Kitchen acetaminophen (TYLENOL) 500 MG tablet Take 1,000 mg by mouth every 8 (eight) hours as needed (PAIN).     Marland Kitchen cyclobenzaprine (FLEXERIL) 5 MG tablet Take 5 mg by mouth 3 (three) times daily as needed for muscle spasms.   3  . diphenoxylate-atropine (LOMOTIL) 2.5-0.025 MG tablet Take 2 tablets by mouth 4 (four) times daily as needed for diarrhea or loose stools. 30 tablet 0  . ondansetron (ZOFRAN) 8 MG tablet Take 1 tablet (8 mg total) by mouth every 12 (twelve) hours as needed for nausea or vomiting. 20 tablet 1  . prochlorperazine (COMPAZINE) 10 MG tablet Take 1 tablet (10 mg total) by mouth every 6 (six) hours as needed for nausea or vomiting. 20 tablet 1  . traMADol (ULTRAM) 50 MG tablet Take 1 tablet (50 mg total) by mouth every 12 (twelve) hours as needed. 30 tablet 0    Results for  orders placed or performed during the hospital encounter of 08/30/18 (from the past 48 hour(s))  APTT     Status: Abnormal   Collection Time: 08/30/18  2:56 PM  Result Value Ref Range   aPTT 37 (H) 24 - 36 seconds    Comment:        IF BASELINE aPTT IS ELEVATED, SUGGEST PATIENT RISK ASSESSMENT BE USED TO DETERMINE APPROPRIATE ANTICOAGULANT THERAPY. Performed at [facility], [place] [address], [place], [place] [address]   CBC WITH DIFFERENTIAL     Status: Abnormal   Collection Time: 08/30/18  2:56 PM  Result Value Ref Range   WBC 9.5 4.0 - 10.5 K/uL   RBC 4.74 3.87 - 5.11 MIL/uL   Hemoglobin 9.8 (L) 12.0 - 15.0 g/dL   HCT 34.0 (L) 36.0 - 46.0 %   MCV 71.7 (L) 80.0 - 100.0 fL   MCH 20.7 (L) 26.0 - 34.0 pg   MCHC 28.8 (L) 30.0 - 36.0 g/dL   RDW 15.0 11.5 -  15.5 %   Platelets 305 150 - 400 K/uL   nRBC 0.0 0.0 - 0.2 %   Neutrophils Relative % 82 %   Neutro Abs 7.9 (H) 1.7 - 7.7 K/uL   Lymphocytes Relative 8 %   Lymphs Abs 0.8 0.7 - 4.0 K/uL   Monocytes Relative 7 %   Monocytes Absolute 0.6 0.1 - 1.0 K/uL   Eosinophils Relative 2 %   Eosinophils Absolute 0.2 0.0 - 0.5 K/uL   Basophils Relative 0 %   Basophils Absolute 0.0 0.0 - 0.1 K/uL   Immature Granulocytes 1 %   Abs Immature Granulocytes 0.05 0.00 - 0.07 K/uL    Comment: Performed at [facility], [place] [address] [place], [place] [address]  Comprehensive metabolic panel     Status: Abnormal   Collection Time: 08/30/18  2:56 PM  Result Value Ref Range   Sodium 137 135 - 145 mmol/L   Potassium 3.7 3.5 - 5.1 mmol/L   Chloride 101 98 - 111 mmol/L   CO2 28 22 - 32 mmol/L   Glucose, Bld 104 (H) 70 - 99 mg/dL   BUN 11 6 - 20 mg/dL   Creatinine, Ser 0.44 0.44 - 1.00 mg/dL   Calcium 9.0 8.9 - 10.3 mg/dL   Total Protein 7.4 6.5 - 8.1 g/dL   Albumin 4.2 3.5 - 5.0 g/dL   AST 16 15 - 41 U/L   ALT 9 0 - 44 U/L   Alkaline Phosphatase 102 38 - 126 U/L   Total Bilirubin 0.7 0.3 - 1.2 mg/dL   GFR calc non Af Amer >60 >60 mL/min   GFR calc Af Amer >60 >60 mL/min   Anion gap 8 5 - 15    Comment: Performed at [facility], [place] [address], [place], [place] [address]  Hemoglobin A1c     Status: None   Collection Time: 08/30/18  2:56 PM  Result Value Ref Range   Hgb A1c MFr Bld 4.9 4.8 - 5.6 %    Comment: (NOTE)         Prediabetes: 5.7 - 6.4         Diabetes: >6.4         Glycemic control for adults with diabetes: <7.0    Mean Plasma Glucose 94 mg/dL    Comment: (NOTE) Performed At: [facility] [address] [place], [place] [id number] Rush Farmer MD GM:[id number]   [id number]  Status: None   Collection Time: 08/30/18  2:56 PM  Result Value Ref Range   Prothrombin Time 13.9 11.4 - 15.2 seconds   INR 1.08     Comment: Performed at  [facility], [place] [address] [place], [place] [address]  Type and screen     Status: None   Collection Time: 08/30/18  2:56 PM  Result Value Ref Range   ABO/RH(D) A POS    Antibody Screen NEG    Sample Expiration 09/13/2018    Extend sample reason      NO TRANSFUSIONS OR PREGNANCY IN THE PAST 3 MONTHS Performed at [facility], [place] [address] [place], [place] [address]   ABO/Rh     Status: None   Collection Time: 08/30/18  2:56 PM  Result Value Ref Range   ABO/RH(D)      A POS Performed at [facility], [place] [address], [place], [place] [address]    No results found.  Review of Systems  Constitutional: Negative for chills and fever.  HENT: Negative.   Skin: Negative.   All other systems reviewed and are negative.   Blood pressure (!) 141/80, pulse 77, temperature 97.7 F (36.5 C), temperature source Oral, resp. rate 18, height 5\' 7"  (1.702 m), weight 57.6 kg, SpO2 95 %. Physical Exam  Constitutional: She appears well-developed.  HENT:  Head: Normocephalic.  Eyes: Pupils are equal, round, and reactive to light.  Neck: Normal range of motion.  Cardiovascular: Normal rate.  Respiratory: Effort normal.  GI: Soft.  Genitourinary:    Genitourinary Comments: No CVAT   Musculoskeletal: Normal range of motion.  Neurological: She is alert.  Skin: Skin is warm.  Psychiatric: She has a normal mood and affect.     Assessment/Plan  Proceed as planned with cysto, bilateral retrogrades, and externalized ureteral stent placement. Risks, benefits, expected peri-op course discussed. If any additional GU reconstruction is necessary, we will be available to assist.   Alexis Frock, MD 09/01/2018, 7:08 AM

## 2018-09-02 LAB — CBC
HCT: 25.2 % — ABNORMAL LOW (ref 36.0–46.0)
HEMOGLOBIN: 7.4 g/dL — AB (ref 12.0–15.0)
MCH: 21.1 pg — ABNORMAL LOW (ref 26.0–34.0)
MCHC: 29.4 g/dL — ABNORMAL LOW (ref 30.0–36.0)
MCV: 72 fL — ABNORMAL LOW (ref 80.0–100.0)
Platelets: 240 10*3/uL (ref 150–400)
RBC: 3.5 MIL/uL — ABNORMAL LOW (ref 3.87–5.11)
RDW: 14.9 % (ref 11.5–15.5)
WBC: 13 10*3/uL — ABNORMAL HIGH (ref 4.0–10.5)
nRBC: 0 % (ref 0.0–0.2)

## 2018-09-02 LAB — BASIC METABOLIC PANEL
Anion gap: 7 (ref 5–15)
BUN: 9 mg/dL (ref 6–20)
CO2: 24 mmol/L (ref 22–32)
Calcium: 8.1 mg/dL — ABNORMAL LOW (ref 8.9–10.3)
Chloride: 108 mmol/L (ref 98–111)
Creatinine, Ser: 0.57 mg/dL (ref 0.44–1.00)
GFR calc Af Amer: >60 mL/min (ref >60)
GFR calc non Af Amer: >60 mL/min (ref >60)
Glucose, Bld: 127 mg/dL — ABNORMAL HIGH (ref 70–99)
POTASSIUM: 4.2 mmol/L (ref 3.5–5.1)
Sodium: 139 mmol/L (ref 135–145)

## 2018-09-02 NOTE — Progress Notes (Signed)
Paged MD this morning to give updates about pt. Pt's B/P still low. 70/05,25/91.Waiting for MD to call back. Also pt's Hgb droped to 7.4 this morning from 9.2, pt is not symptomatic. Will continue to monitor. From pt's F/C draining clear urine now,

## 2018-09-02 NOTE — Progress Notes (Addendum)
PT Cancellation Note  Patient Details Name: Baila Rouse MRN: [id number] DOB: [birth year]   Cancelled Treatment:    Reason Eval/Treat Not Completed: Medical issues which prohibited therapy. Spoke with patient's nurse who requested to hold off on PT at this time due to low blood pressure and issues with pain.  Will check back as able to complete PT eval.  Addendum: Checked back and nurse requesting PT check back tomorrow.    Galen Manila 09/02/2018, 11:42 AM

## 2018-09-02 NOTE — Progress Notes (Signed)
OT Cancellation Note  Patient Details Name: Anetria Harwick MRN: [id number] DOB: Oct 21, 1965   Cancelled Treatment:    Reason Eval/Treat Not Completed: Medical issues which prohibited therapy. See PT note to hold therapy right now per nursing. Will also check back another time for OT eval.  Philippa Chester 09/02/2018, 12:30 PM

## 2018-09-02 NOTE — Progress Notes (Signed)
1 Day Post-Op   Subjective/Chief Complaint: Complains of some pain. Narcotics not given because of BP. Pt states she normally runs systolics in the 28'Z. Denies dizziness. Making 100cc/hr of urine   Objective: Vital signs in last 24 hours: Temp:  [97.4 F (36.3 C)-98.7 F (37.1 C)] 98.7 F (37.1 C) (02/22 0500) Pulse Rate:  [63-90] 78 (02/22 0510) Resp:  [11-18] 14 (02/22 0500) BP: (82-138)/(54-94) 84/61 (02/22 0510) SpO2:  [96 %-100 %] 97 % (02/22 0500) Arterial Line BP: (112-132)/(61-68) 118/63 (02/21 1430) Weight:  [61.5 kg] 61.5 kg (02/22 0500)    Intake/Output from previous day: 02/21 0701 - 02/22 0700 In: 4308.3 [P.O.:350; I.V.:3458.3; IV Piggyback:500] Out: 2200 [Urine:1850; Drains:150; Blood:200] Intake/Output this shift: No intake/output data recorded.  General appearance: alert and cooperative Resp: clear to auscultation bilaterally Cardio: regular rate and rhythm GI: soft, mild tenderness. good bs. ostomy pink  Lab Results:  Recent Labs    08/30/18 1456 09/01/18 1115 09/02/18 0330  WBC 9.5  --  13.0*  HGB 9.8* 9.2* 7.4*  HCT 34.0* 27.0* 25.2*  PLT 305  --  240   BMET Recent Labs    08/30/18 1456 09/01/18 1115 09/02/18 0330  NA 137 137 139  K 3.7 3.7 4.2  CL 101  --  108  CO2 28  --  24  GLUCOSE 104*  --  127*  BUN 11  --  9  CREATININE 0.44  --  0.57  CALCIUM 9.0  --  8.1*   PT/INR Recent Labs    08/30/18 1456  LABPROT 13.9  INR 1.08   ABG Recent Labs    09/01/18 1115  PHART 7.297*  HCO3 27.7    Studies/Results: Dg C-arm 1-60 Min-no Report  Result Date: 09/01/2018 Fluoroscopy was utilized by the requesting physician.  No radiographic interpretation.    Anti-infectives: Anti-infectives (From admission, onward)   Start     Dose/Rate Route Frequency Ordered Stop   09/01/18 1400  neomycin (MYCIFRADIN) tablet 1,000 mg  Status:  Discontinued     1,000 mg Oral 3 times per day 09/01/18 0615 09/01/18 0617   09/01/18 1400   metroNIDAZOLE (FLAGYL) tablet 1,000 mg  Status:  Discontinued     1,000 mg Oral 3 times per day 09/01/18 0615 09/01/18 0617   09/01/18 0630  cefoTEtan (CEFOTAN) 2 g in sodium chloride 0.9 % 100 mL IVPB     2 g 200 mL/hr over 30 Minutes Intravenous On call to O.R. 09/01/18 6629 09/01/18 0742      Assessment/Plan: s/p Procedure(s): XI ROBOTIC ASSISTED ABDOMINOPERINEAL RESECTION WITH END COLOSTOMY (N/A) XI ROBOTIC ASSISTED PARTIAL VAGINECTOMY (Bilateral) CYSTOSCOPY WITH BILATERAL  RETROGRADE PYELOGRAM (Bilateral) CYSTOSCOPY WITH URETERAL COOK CATHETER PLACEMENT (Bilateral) Advance diet. Start fulls this am Hg 7.4 but she started at 9. Drain output serosanguinous. No sign of bleeding. Will not transfuse unless she becomes symptomatic since she had a rectal cancer. Leave foley in to monitor perfusion status No tachycardia OOB to chair POD 1  LOS: 1 day    Autumn Messing III 09/02/2018

## 2018-09-02 NOTE — Progress Notes (Signed)
1 Day Post-Op Procedure(s) (LRB): XI ROBOTIC ASSISTED ABDOMINOPERINEAL RESECTION WITH END COLOSTOMY (N/A) XI ROBOTIC ASSISTED PARTIAL VAGINECTOMY (Bilateral) CYSTOSCOPY WITH BILATERAL  RETROGRADE PYELOGRAM (Bilateral) CYSTOSCOPY WITH URETERAL COOK CATHETER PLACEMENT (Bilateral)  Subjective: Patient reports some pain in abdomin, feels weak, no emesis. Has had some bleeding from perineum.    Objective: Vital signs in last 24 hours: Temp:  [97.4 F (36.3 C)-98.7 F (37.1 C)] 98.7 F (37.1 C) (02/22 0500) Pulse Rate:  [63-90] 78 (02/22 0510) Resp:  [11-18] 14 (02/22 0500) BP: (82-138)/(54-94) 84/61 (02/22 0510) SpO2:  [96 %-100 %] 97 % (02/22 0500) Arterial Line BP: (112-132)/(61-68) 118/63 (02/21 1430) Weight:  [135 lb 9.3 oz (61.5 kg)] 135 lb 9.3 oz (61.5 kg) (02/22 0500)    Intake/Output from previous day: 02/21 0701 - 02/22 0700 In: 4308.3 [P.O.:350; I.V.:3458.3; IV Piggyback:500] Out: 2200 [Urine:1850; Drains:150; Blood:200]  Physical Examination: General: alert and cooperative GI: soft, non-tender; bowel sounds normal; no masses,  no organomegaly, incision: clean and dry and stoma pink with bag sweat but no output or gas Vaginal Bleeding: minimal  Labs: WBC/Hgb/Hct/Plts:  13.0/7.4/25.2/240 (02/22 0330) BUN/Cr/glu/ALT/AST/amyl/lip:  9/0.57/--/--/--/--/-- (02/22 0330)   Assessment:  53 y.o. s/p Procedure(s): XI ROBOTIC ASSISTED ABDOMINOPERINEAL RESECTION WITH END COLOSTOMY XI ROBOTIC ASSISTED PARTIAL VAGINECTOMY CYSTOSCOPY WITH BILATERAL  RETROGRADE PYELOGRAM CYSTOSCOPY WITH URETERAL COOK CATHETER PLACEMENT: anemia Pain:  Pain is well-controlled on prn medications.  Heme:Anemia: acute blood loss anemia but without signs of active bleeding (from JP or perineum). Continue to watch CBC and vitals. Consider transfusion for hemodynamic instability or Hb <7.  CV: Hypotension: likely secondary to acute blood loss anemia. However, good urine output and mentating normally and  no apparent active bleeding.  GI:  Tolerating po: Yes     FEN: no repletion necessary, continue to observe.   Prophylaxis: pharmacologic prophylaxis (with any of the following: unfractionated SQ heparin 5000 units 2 hours prior to surgery then every 12 hours). Consider holding if concerns for ongoing bleeding.  Plan: primary management by general surgery team. Will defer to their judgment regarding management of anemia and advancement of diet.  Prescribed ice to perineum Will continue to follow.   LOS: 1 day    Tina Morales 09/02/2018, 8:14 AM

## 2018-09-02 NOTE — Progress Notes (Signed)
After bolus NS ,checked pt's B/P : 86/56 from dinamap and 84/54 from manual. Pt is not symptomatic. Pt is alert and oriented,verbalized .  Notified to on-call Dr Autumn Messing again. Also mentioned to MD  Pt's urine looks bloody. MD said if pt is not symptomatic just continue to monitor. Will continue to monitor.

## 2018-09-03 LAB — BASIC METABOLIC PANEL
Anion gap: 3 — ABNORMAL LOW (ref 5–15)
BUN: 8 mg/dL (ref 6–20)
CO2: 29 mmol/L (ref 22–32)
Calcium: 8.1 mg/dL — ABNORMAL LOW (ref 8.9–10.3)
Chloride: 106 mmol/L (ref 98–111)
Creatinine, Ser: 0.5 mg/dL (ref 0.44–1.00)
GFR calc Af Amer: >60 mL/min (ref >60)
GFR calc non Af Amer: >60 mL/min (ref >60)
Glucose, Bld: 94 mg/dL (ref 70–99)
Potassium: 4.6 mmol/L (ref 3.5–5.1)
Sodium: 138 mmol/L (ref 135–145)

## 2018-09-03 LAB — CBC
HEMATOCRIT: 25.9 % — AB (ref 36.0–46.0)
Hemoglobin: 7.5 g/dL — ABNORMAL LOW (ref 12.0–15.0)
MCH: 21 pg — ABNORMAL LOW (ref 26.0–34.0)
MCHC: 29 g/dL — ABNORMAL LOW (ref 30.0–36.0)
MCV: 72.5 fL — ABNORMAL LOW (ref 80.0–100.0)
NRBC: 0 % (ref 0.0–0.2)
Platelets: 248 10*3/uL (ref 150–400)
RBC: 3.57 MIL/uL — ABNORMAL LOW (ref 3.87–5.11)
RDW: 14.9 % (ref 11.5–15.5)
WBC: 7.7 10*3/uL (ref 4.0–10.5)

## 2018-09-03 MED ORDER — SODIUM CHLORIDE 0.9% FLUSH: 10.0000 mL | INTRAVENOUS | Status: DC | PRN

## 2018-09-03 MED ORDER — SODIUM CHLORIDE 0.9% FLUSH: 3.0000 mL | INTRAVENOUS | Status: DC | PRN

## 2018-09-03 NOTE — Progress Notes (Signed)
Called to access right PAC.Patient currently wants to wait getting PAC accessed because MD planning discharge patient tomorrow. Patient with working PIV at this time. Patient will notify primary RN if she wants PAC accessed.

## 2018-09-03 NOTE — Progress Notes (Signed)
2 Days Post-Op   Subjective/Chief Complaint: Feels better today. Tolerating diet   Objective: Vital signs in last 24 hours: Temp:  [97.7 F (36.5 C)-98.9 F (37.2 C)] 98.2 F (36.8 C) (02/23 0521) Pulse Rate:  [56-71] 62 (02/23 0645) Resp:  [14-16] 16 (02/23 0521) BP: (79-94)/(52-69) 86/52 (02/23 0645) SpO2:  [94 %-98 %] 97 % (02/23 0521) Weight:  [62.7 kg] 62.7 kg (02/23 0645) Last BM Date: 09/02/18  Intake/Output from previous day: 02/22 0701 - 02/23 0700 In: 1209.7 [P.O.:720; I.V.:489.7] Out: 3446 [Urine:2650; Drains:370; Stool:426] Intake/Output this shift: No intake/output data recorded.  General appearance: alert and cooperative Resp: clear to auscultation bilaterally Cardio: regular rate and rhythm GI: soft, mild tenderness. incisions look good. ostomy pink and productive  Lab Results:  Recent Labs    09/02/18 0330 09/03/18 0357  WBC 13.0* 7.7  HGB 7.4* 7.5*  HCT 25.2* 25.9*  PLT 240 248   BMET Recent Labs    09/02/18 0330 09/03/18 0357  NA 139 138  K 4.2 4.6  CL 108 106  CO2 24 29  GLUCOSE 127* 94  BUN 9 8  CREATININE 0.57 0.50  CALCIUM 8.1* 8.1*   PT/INR No results for input(s): LABPROT, INR in the last 72 hours. ABG Recent Labs    09/01/18 1115  PHART 7.297*  HCO3 27.7    Studies/Results: Dg C-arm 1-60 Min-no Report  Result Date: 09/01/2018 Fluoroscopy was utilized by the requesting physician.  No radiographic interpretation.    Anti-infectives: Anti-infectives (From admission, onward)   Start     Dose/Rate Route Frequency Ordered Stop   09/01/18 1400  neomycin (MYCIFRADIN) tablet 1,000 mg  Status:  Discontinued     1,000 mg Oral 3 times per day 09/01/18 0615 09/01/18 0617   09/01/18 1400  metroNIDAZOLE (FLAGYL) tablet 1,000 mg  Status:  Discontinued     1,000 mg Oral 3 times per day 09/01/18 0615 09/01/18 0617   09/01/18 0630  cefoTEtan (CEFOTAN) 2 g in sodium chloride 0.9 % 100 mL IVPB     2 g 200 mL/hr over 30 Minutes  Intravenous On call to O.R. 09/01/18 3570 09/01/18 0742      Assessment/Plan: s/p Procedure(s): XI ROBOTIC ASSISTED ABDOMINOPERINEAL RESECTION WITH END COLOSTOMY (N/A) XI ROBOTIC ASSISTED PARTIAL VAGINECTOMY (Bilateral) CYSTOSCOPY WITH BILATERAL  RETROGRADE PYELOGRAM (Bilateral) CYSTOSCOPY WITH URETERAL COOK CATHETER PLACEMENT (Bilateral) Advance diet . Start soft diet D/c foley Saline lock IV  Ostomy teaching and change dressings ambulate  LOS: 2 days    Autumn Messing III 09/03/2018

## 2018-09-03 NOTE — Evaluation (Signed)
Occupational Therapy Evaluation Patient Details Name: Tina Morales MRN: [id number] DOB: Jun 18, 1966 Today's Date: 09/03/2018    History of Present Illness 53 y.o. female admitted with rectal cancer, s/p resection with end colostomy.  History of anxiety.    Clinical Impression   Pt s/p above. Pt independent with ADLs, PTA. Feel pt will benefit from acute OT to increase independence prior to d/c.     Follow Up Recommendations  No OT follow up;Supervision/Assistance - 24 hour    Equipment Recommendations  Other (comment)(RW with two wheels)    Recommendations for Other Services       Precautions / Restrictions Precautions Precautions: Fall Precaution Comments: colostomy; pt deneis h/o falls Restrictions Weight Bearing Restrictions: No      Mobility Bed Mobility Overal bed mobility: Needs Assistance Bed Mobility: Rolling;Sidelying to Sit Rolling: Supervision Sidelying to sit: Supervision       General bed mobility comments: verbal cues for log roll technique, used bedrail  Transfers Overall transfer level: Needs assistance Equipment used: Rolling walker (2 wheeled) Transfers: Sit to/from Stand Sit to Stand: From elevated surface;Min guard         General transfer comment: VCs hand placement    Balance    Used RW for ambulation. No LOB.                                   ADL either performed or assessed with clinical judgement   ADL Overall ADL's : Needs assistance/impaired                     Lower Body Dressing: Minimal assistance;Sit to/from stand   Toilet Transfer: Designer, television/film set Details (indicate cue type and reason): had chair follow for safety in hallway         Functional mobility during ADLs: Min guard;Rolling walker(had chair follow in hallway for safety but pt did great) General ADL Comments: Pt able to reach to socks.      Vision         Perception     Praxis      Pertinent Vitals/Pain Pain  Assessment: 0-10 Pain Score: 6  Pain Location: perineal area with walking Pain Descriptors / Indicators: Sore Pain Intervention(s): Limited activity within patient's tolerance;Monitored during session;Patient requesting pain meds-RN notified;Repositioned     Hand Dominance     Extremity/Trunk Assessment Upper Extremity Assessment Upper Extremity Assessment: Defer to OT evaluation   Lower Extremity Assessment Lower Extremity Assessment: Overall WFL for tasks assessed(pt reports some tingling in B toes 2* chemotherapy)   Cervical / Trunk Assessment Cervical / Trunk Assessment: Normal   Communication Communication Communication: No difficulties   Cognition Arousal/Alertness: Awake/alert Behavior During Therapy: WFL for tasks assessed/performed Overall Cognitive Status: Within Functional Limits for tasks assessed                                     General Comments       Exercises     Shoulder Instructions      Home Living Family/patient expects to be discharged to:: Private residence Living Arrangements: Spouse/significant other Available Help at Discharge: Family;Available 24 hours/day Type of Home: House Home Access: Stairs to enter [employer] of Steps: 3-4 Entrance Stairs-Rails: Right;Left Home Layout: One level     Bathroom Shower/Tub: Occupational psychologist: Handicapped  height     Home Equipment: Grab bars - tub/shower;Grab bars - toilet; built in shower seat          Prior Functioning/Environment Level of Independence: Independent                 OT Problem List: Decreased strength;Pain;Decreased activity tolerance;Decreased knowledge of use of DME or AE;Decreased knowledge of precautions      OT Treatment/Interventions: Therapeutic activities;Patient/family education;Balance training;Self-care/ADL training;DME and/or AE instruction    OT Goals(Current goals can be found in the care plan section) Acute  Rehab OT Goals Patient Stated Goal: likes to play drums and guitar OT Goal Formulation: With patient Time For Goal Achievement: 09/10/18 Potential to Achieve Goals: Good ADL Goals Pt Will Perform Lower Body Bathing: with supervision;sit to/from stand Pt Will Perform Lower Body Dressing: sit to/from stand;with supervision Pt Will Transfer to Toilet: ambulating;with supervision  OT Frequency: Min 2X/week   Barriers to D/C:            Co-evaluation PT/OT/SLP Co-Evaluation/Treatment: Yes Reason for Co-Treatment: For patient/therapist safety PT goals addressed during session: Mobility/safety with mobility;Proper use of DME OT goals addressed during session: ADL's and self-care;Other (comment)(mobilty)      AM-PAC OT "6 Clicks" Daily Activity     Outcome Measure Help from another person eating meals?: None Help from another person taking care of personal grooming?: A Little Help from another person toileting, which includes using toliet, bedpan, or urinal?: A Little Help from another person bathing (including washing, rinsing, drying)?: A Little Help from another person to put on and taking off regular upper body clothing?: A Little Help from another person to put on and taking off regular lower body clothing?: A Little 6 Click Score: 19   End of Session Equipment Utilized During Treatment: Rolling walker Nurse Communication: Other (comment)(PT updated nurse on BP and pt may need colostomy bag emptied)  Activity Tolerance: Patient tolerated treatment well Patient left: in chair;with call bell/phone within reach;with family/visitor present  OT Visit Diagnosis: Pain Pain - Right/Left: (rectum and abdomen) Pain - part of body: (rectum and abdomen)                Time: 2956-2130 OT Time Calculation (min): 23 min Charges:  OT General Charges $OT Visit: 1 Visit OT Evaluation $OT Eval Moderate Complexity: 1 Mod  Woodie Degraffenreid L Namon Villarin OTR/L 09/03/2018, 10:02 AM

## 2018-09-03 NOTE — Evaluation (Signed)
Physical Therapy Evaluation Patient Details Name: Tina Morales MRN: [id number] DOB: Oct 14, 1965 Today's Date: 09/03/2018   History of Present Illness  53 y.o. female admitted with rectal cancer, s/p resection with end colostomy.  History of anxiety.   Clinical Impression  Pt admitted with above diagnosis. Pt currently with functional limitations due to the deficits listed below (see PT Problem List). Instructed pt in log roll technique for bed mobility. She ambulated 100' with RW, no loss of balance. Good progress expected. Encouraged pt to ambulate in halls 2-3x/day to minimize deconditioning.  Pt will benefit from skilled PT to increase their independence and safety with mobility to allow discharge to the venue listed below.       Follow Up Recommendations No PT follow up    Equipment Recommendations  Rolling walker with 5" wheels    Recommendations for Other Services       Precautions / Restrictions Precautions Precautions: Fall Precaution Comments: colostomy; pt deneis h/o falls Restrictions Weight Bearing Restrictions: No      Mobility  Bed Mobility Overal bed mobility: Needs Assistance Bed Mobility: Rolling;Sidelying to Sit Rolling: Supervision Sidelying to sit: Supervision       General bed mobility comments: verbal cues for log roll technique, used bedrail  Transfers Overall transfer level: Needs assistance Equipment used: Rolling walker (2 wheeled) Transfers: Sit to/from Stand Sit to Stand: From elevated surface;Min guard         General transfer comment: VCs hand placement  Ambulation/Gait Ambulation/Gait assistance: Min guard Gait Distance (Feet): 100 Feet Assistive device: Rolling walker (2 wheeled) Gait Pattern/deviations: Step-through pattern;Decreased stride length Gait velocity: decr   General Gait Details: steady, no loss of balance, 6/10 perineal pain while walking, pain meds requested  Stairs            Wheelchair Mobility     Modified Rankin (Stroke Patients Only)       Balance Overall balance assessment: Modified Independent                                           Pertinent Vitals/Pain Pain Assessment: (P) 0-10 Pain Score: 6  Pain Location: perineal area with walking Pain Descriptors / Indicators: Sore Pain Intervention(s): Limited activity within patient's tolerance;Monitored during session;Patient requesting pain meds-RN notified;Repositioned    Home Living Family/patient expects to be discharged to:: (P) Private residence Living Arrangements: (P) Spouse/significant other Available Help at Discharge: (P) Family;Available 24 hours/day Type of Home: (P) House Home Access: (P) Stairs to enter Entrance Stairs-Rails: (P) Right;Left Entrance Stairs-Number of Steps: (P) 3-4 Home Layout: (P) One level Home Equipment: (P) Grab bars - tub/shower;Grab bars - toilet      Prior Function Level of Independence: (P) Independent               Hand Dominance        Extremity/Trunk Assessment   Upper Extremity Assessment Upper Extremity Assessment: Defer to OT evaluation    Lower Extremity Assessment Lower Extremity Assessment: Overall WFL for tasks assessed(pt reports some tingling in B toes 2* chemotherapy)    Cervical / Trunk Assessment Cervical / Trunk Assessment: Normal  Communication   Communication: (P) No difficulties  Cognition Arousal/Alertness: Awake/alert Behavior During Therapy: WFL for tasks assessed/performed Overall Cognitive Status: Within Functional Limits for tasks assessed  General Comments      Exercises     Assessment/Plan    PT Assessment Patient needs continued PT services  PT Problem List Decreased mobility;Decreased activity tolerance;Pain;Decreased knowledge of use of DME       PT Treatment Interventions Gait training;Functional mobility training;Therapeutic  exercise;Patient/family education    PT Goals (Current goals can be found in the Care Plan section)  Acute Rehab PT Goals Patient Stated Goal: likes to play drums and guitar PT Goal Formulation: With patient/family Time For Goal Achievement: 09/10/18 Potential to Achieve Goals: Good    Frequency Min 3X/week   Barriers to discharge        Co-evaluation PT/OT/SLP Co-Evaluation/Treatment: Yes Reason for Co-Treatment: For patient/therapist safety PT goals addressed during session: Mobility/safety with mobility;Proper use of DME OT goals addressed during session: ADL's and self-care;Other (comment)(mobilty)       AM-PAC PT "6 Clicks" Mobility  Outcome Measure Help needed turning from your back to your side while in a flat bed without using bedrails?: A Little Help needed moving from lying on your back to sitting on the side of a flat bed without using bedrails?: A Little Help needed moving to and from a bed to a chair (including a wheelchair)?: A Little Help needed standing up from a chair using your arms (e.g., wheelchair or bedside chair)?: A Little Help needed to walk in hospital room?: None Help needed climbing 3-5 steps with a railing? : A Little 6 Click Score: 19    End of Session Equipment Utilized During Treatment: Gait belt Activity Tolerance: Patient limited by pain;Patient tolerated treatment well Patient left: in chair;with call bell/phone within reach;with family/visitor present Nurse Communication: Mobility status PT Visit Diagnosis: Difficulty in walking, not elsewhere classified (R26.2);Pain    Time: 5449-2010 PT Time Calculation (min) (ACUTE ONLY): 26 min   Charges:   PT Evaluation $PT Eval Low Complexity: 1 Low          Blondell Reveal Kistler PT 09/03/2018  Acute Rehabilitation Services Pager [phone] Office [phone]

## 2018-09-03 NOTE — Progress Notes (Signed)
2 Days Post-Op Procedure(s) (LRB): XI ROBOTIC ASSISTED ABDOMINOPERINEAL RESECTION WITH END COLOSTOMY (N/A) XI ROBOTIC ASSISTED PARTIAL VAGINECTOMY (Bilateral) CYSTOSCOPY WITH BILATERAL  RETROGRADE PYELOGRAM (Bilateral) CYSTOSCOPY WITH URETERAL COOK CATHETER PLACEMENT (Bilateral)  Subjective: Patient reports improved pain today. No new complaints.   Objective: Vital signs in last 24 hours: Temp:  [97.7 F (36.5 C)-98.9 F (37.2 C)] 98.2 F (36.8 C) (02/23 0521) Pulse Rate:  [56-71] 62 (02/23 0645) Resp:  [14-16] 16 (02/23 0521) BP: (79-94)/(52-69) 86/52 (02/23 0645) SpO2:  [94 %-98 %] 97 % (02/23 0521) Weight:  [138 lb 3.7 oz (62.7 kg)] 138 lb 3.7 oz (62.7 kg) (02/23 0645) Last BM Date: 09/02/18  Intake/Output from previous day: 02/22 0701 - 02/23 0700 In: 1209.7 [P.O.:720; I.V.:489.7] Out: 1610 [Urine:2650; Drains:370; Stool:426]  Physical Examination: General: alert and cooperative GI: soft, non-tender; bowel sounds normal; no masses,  no organomegaly, incision: clean and dry and stoma pink with stool and gas Vaginal Bleeding: minimal, incision clean and in tact with minimal edema  Labs: WBC/Hgb/Hct/Plts:  7.7/7.5/25.9/248 (02/23 0357) BUN/Cr/glu/ALT/AST/amyl/lip:  8/0.50/--/--/--/--/-- (02/23 0357)   Assessment:  53 y.o. s/p Procedure(s): XI ROBOTIC ASSISTED ABDOMINOPERINEAL RESECTION WITH END COLOSTOMY XI ROBOTIC ASSISTED PARTIAL VAGINECTOMY CYSTOSCOPY WITH BILATERAL  RETROGRADE PYELOGRAM CYSTOSCOPY WITH URETERAL COOK CATHETER PLACEMENT: anemia Pain:  Pain is well-controlled on prn medications.  Heme:Anemia: acute blood loss anemia but without signs of active bleeding (from JP or perineum). Stable Hb. Continue to watch CBC and vitals. Consider transfusion for hemodynamic instability or Hb <7.  CV: Hypotension: stable, slightly improved. likely secondary to acute blood loss anemia. However, good urine output and mentating normally and no apparent active  bleeding.  GI:  Tolerating po: Yes   Stoma functioning.   FEN: no repletion necessary, continue to observe.   Prophylaxis: pharmacologic prophylaxis (with any of the following: unfractionated SQ heparin 5000 units 2 hours prior to surgery then every 12 hours). Consider holding if concerns for ongoing bleeding.  Plan: primary management by general surgery team. Will defer to their judgment regarding management of anemia and advancement of diet.  Prescribed ice to perineum. Recommended regular changes of peripad to nursing.  Will continue to follow.   LOS: 2 days    Tina Morales 09/03/2018, 7:57 AM

## 2018-09-04 ENCOUNTER — Encounter (HOSPITAL_COMMUNITY): Payer: Self-pay | Admitting: Surgery

## 2018-09-04 LAB — CBC
HCT: 28.4 % — ABNORMAL LOW (ref 36.0–46.0)
Hemoglobin: 8.2 g/dL — ABNORMAL LOW (ref 12.0–15.0)
MCH: 20.8 pg — ABNORMAL LOW (ref 26.0–34.0)
MCHC: 28.9 g/dL — ABNORMAL LOW (ref 30.0–36.0)
MCV: 72.1 fL — ABNORMAL LOW (ref 80.0–100.0)
Platelets: 312 10*3/uL (ref 150–400)
RBC: 3.94 MIL/uL (ref 3.87–5.11)
RDW: 14.8 % (ref 11.5–15.5)
WBC: 11.6 10*3/uL — ABNORMAL HIGH (ref 4.0–10.5)
nRBC: 0 % (ref 0.0–0.2)

## 2018-09-04 LAB — BASIC METABOLIC PANEL
Anion gap: 7 (ref 5–15)
BUN: 10 mg/dL (ref 6–20)
CO2: 27 mmol/L (ref 22–32)
Calcium: 8.1 mg/dL — ABNORMAL LOW (ref 8.9–10.3)
Chloride: 101 mmol/L (ref 98–111)
Creatinine, Ser: 0.55 mg/dL (ref 0.44–1.00)
GFR calc Af Amer: >60 mL/min (ref >60)
GFR calc non Af Amer: >60 mL/min (ref >60)
Glucose, Bld: 101 mg/dL — ABNORMAL HIGH (ref 70–99)
Potassium: 3.7 mmol/L (ref 3.5–5.1)
Sodium: 135 mmol/L (ref 135–145)

## 2018-09-04 MED ORDER — TRAMADOL HCL 50 MG PO TABS: 50.0000 mg | ORAL_TABLET | Freq: Four times a day (QID) | ORAL | 0 refills | Status: AC | PRN

## 2018-09-04 MED ORDER — GABAPENTIN 300 MG PO CAPS: 300.0000 mg | ORAL_CAPSULE | Freq: Three times a day (TID) | ORAL | 1 refills | Status: DC

## 2018-09-04 NOTE — Discharge Summary (Addendum)
Patient ID: Tina Morales MRN: [id number] DOB/AGE: March 24, 1966 53 y.o.  Admit date: 09/01/2018 Discharge date: 09/05/2018  Discharge Diagnoses Patient Active Problem List   Diagnosis Date Noted  . Port-A-Cath in place 07/07/2018  . Rectal cancer ([place]) 04/27/2018    Consultants None  Procedures: 09/01/2018 1. Robotic assisted abdominoperineal resection with end colostomy 2. Robotic assisted radical posterior vaginectomy (Dr. Denman George) 3. Cystoscopy/stents (Dr. Tresa Moore)  Hospital Course: She was admitted to the hospital postoperatively where she recovered well. Her stoma began functioning well on POD#2. Her diet was advanced. On POD#4, she was tolerating a regular diet, mobilizing well on her own, pain controlled on oral analgesics and she demonstrated how to cut and apply the ostomy appliance with our wound ostomy team. Her JP drain was in place and drain teaching by nursing team set prior to discharge. She was deemed stable for discharge home on 09/05/2018.  AF VSS; blood pressure at her baseline which is in the 40J-81X systolic at home. Denies dizziness, ambulating well on her own. Denies n/v. Tolerating diet. Having good stoma output  Incisions c/d/i without erythema. Abdomen is soft, NT/ND. JP serous Perineum c/d/i without erythema. Ext wwp without edema   Allergies as of 09/05/2018      Reactions   Sulfa Antibiotics    Patient unaware of side effects, she was told when she was a child that she was allergic      Medication List    TAKE these medications   acetaminophen 500 MG tablet Commonly known as:  TYLENOL Take 1,000 mg by mouth every 8 (eight) hours as needed (PAIN).   ALPRAZolam 0.5 MG tablet Commonly known as:  XANAX Take 1 tablet (0.5 mg total) by mouth 2 (two) times daily as needed. What changed:  when to take this   cyclobenzaprine 5 MG tablet Commonly known as:  FLEXERIL Take 5 mg by mouth 3 (three) times daily as needed for muscle spasms.   diphenoxylate-atropine  2.5-0.025 MG tablet Commonly known as:  LOMOTIL Take 2 tablets by mouth 4 (four) times daily as needed for diarrhea or loose stools.   ferrous sulfate 325 (65 FE) MG EC tablet Take 1 tablet (325 mg total) by mouth 2 (two) times daily with a meal.   gabapentin 300 MG capsule Commonly known as:  NEURONTIN Take 1 capsule (300 mg total) by mouth 3 (three) times daily.   lidocaine-prilocaine cream Commonly known as:  EMLA Apply 1 application topically as needed.   magic mouthwash Soln Take 5-10 mLs by mouth 4 (four) times daily as needed for mouth pain.   metroNIDAZOLE 500 MG tablet Commonly known as:  FLAGYL Take 500 mg by mouth 3 (three) times daily. ONLY T HE DAY BEFORE PROCEDURE   multivitamin with minerals tablet Take 1 tablet by mouth daily.   neomycin 500 MG tablet Commonly known as:  MYCIFRADIN Take 500 mg by mouth 3 (three) times daily. ONLY THE DAY BEFORE PROCEDURE   omeprazole 20 MG capsule Commonly known as:  PRILOSEC Take 20 mg by mouth daily.   ondansetron 8 MG tablet Commonly known as:  ZOFRAN Take 1 tablet (8 mg total) by mouth every 12 (twelve) hours as needed for nausea or vomiting.   prochlorperazine 10 MG tablet Commonly known as:  COMPAZINE Take 1 tablet (10 mg total) by mouth every 6 (six) hours as needed for nausea or vomiting.   traMADol 50 MG tablet Commonly known as:  ULTRAM Take 1 tablet (50 mg total) by mouth every  6 (six) hours as needed for up to 7 days (postop pain not controlled with tylenol and ibuprofen). What changed:    when to take this  reasons to take this            Durable Medical Equipment  (From admission, onward)         Start     Ordered   09/04/18 0846  For home use only DME Walker rolling  Once    Question:  Patient needs a walker to treat with the following condition  Answer:  History of rectal cancer   09/04/18 0846           Follow-up Information    [name], MD Follow up in 2 week(s).    Specialty:  General Surgery Contact information: [place] [address] [phone]        Everitt Amber, MD Follow up in 2 week(s).   Specialty:  Gynecologic Oncology Contact information: [address] [place] [address] [phone]        [facility] [place] Follow up in 4 day(s).   Specialty:  General Surgery Why:  Nursing visit for possible drain removal Contact information: [address] [place] Dema Severin, M.D. [facility]

## 2018-09-04 NOTE — Progress Notes (Signed)
Subjective No acute events. Feeling better each day. Asking to go home today. Reports adequate pain contorl with PO analgesics. Stoma functioning - gas+stool in appliance x24hrs. Tolerating soft diet without n/v.   Objective: Vital signs in last 24 hours: Temp:  [97.6 F (36.4 C)-99.4 F (37.4 C)] 98.6 F (37 C) (02/24 0558) Pulse Rate:  [69-80] 75 (02/24 0558) Resp:  [16] 16 (02/24 0558) BP: (87-101)/(55-67) 98/67 (02/24 0558) SpO2:  [92 %-97 %] 96 % (02/24 0558) Weight:  [62.2 kg] 62.2 kg (02/24 0558) Last BM Date: 09/04/18  Intake/Output from previous day: 02/23 0701 - 02/24 0700 In: 1249.3 [P.O.:960; I.V.:289.3] Out: 3670 [Urine:2825; Drains:295; Stool:550] Intake/Output this shift: Total I/O In: 100 [P.O.:100] Out: 750 [Urine:650; Stool:100]  Gen: NAD, comfortable CV: RRR Pulm: Normal work of breathing Abd: Soft, NT/ND; incision without erythema. JP serous Perineum: Incision c/d/i without erythema Ext: SCDs in place Primary RN served as chaperone for the entire physical exam  Lab Results: CBC  Recent Labs    09/03/18 0357 09/04/18 0407  WBC 7.7 11.6*  HGB 7.5* 8.2*  HCT 25.9* 28.4*  PLT 248 312   BMET Recent Labs    09/03/18 0357 09/04/18 0407  NA 138 135  K 4.6 3.7  CL 106 101  CO2 29 27  GLUCOSE 94 101*  BUN 8 10  CREATININE 0.50 0.55  CALCIUM 8.1* 8.1*   PT/INR No results for input(s): LABPROT, INR in the last 72 hours. ABG Recent Labs    09/01/18 1115  PHART 7.297*  HCO3 27.7    Studies/Results:  Anti-infectives: Anti-infectives (From admission, onward)   Start     Dose/Rate Route Frequency Ordered Stop   09/01/18 1400  neomycin (MYCIFRADIN) tablet 1,000 mg  Status:  Discontinued     1,000 mg Oral 3 times per day 09/01/18 0615 09/01/18 0617   09/01/18 1400  metroNIDAZOLE (FLAGYL) tablet 1,000 mg  Status:  Discontinued     1,000 mg Oral 3 times per day 09/01/18 0615 09/01/18 0617   09/01/18 0630  cefoTEtan (CEFOTAN) 2 g in sodium  chloride 0.9 % 100 mL IVPB     2 g 200 mL/hr over 30 Minutes Intravenous On call to O.R. 09/01/18 0615 09/01/18 0742       Assessment/Plan: Patient Active Problem List   Diagnosis Date Noted  . Port-A-Cath in place 07/07/2018  . Rectal cancer ([place]) 04/27/2018   s/p Procedure(s): XI ROBOTIC ASSISTED ABDOMINOPERINEAL RESECTION WITH END COLOSTOMY XI ROBOTIC ASSISTED PARTIAL VAGINECTOMY CYSTOSCOPY WITH BILATERAL  RETROGRADE PYELOGRAM CYSTOSCOPY WITH URETERAL COOK CATHETER PLACEMENT 09/01/2018  -Recovering well -Diet as tolerated -Ambulat 5x/day with walker as per pt/ot -Home walker ordered -WOCN to see today; she is asking to go home if she feels comfortable caring for her stoma - will wait and see how things go with WOCN   LOS: 3 days   [place] Dema Severin, M.D. [facility]

## 2018-09-04 NOTE — Consult Note (Signed)
[place] Nurse ostomy consult note Stoma type/location: LUQ colostomy Stomal assessment/size: 1 5/8" pink patent and producing liquid green stool. 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  Covered with skin barrier and tape.  Will not change until pouch is changed again.  Treatment options for stomal/peristomal skin: convex 1 piece pouch Output liquid green stool Ostomy pouching: 1pc.convex Education provided: pouch change performed.  Patient cut barrier to fit, applied pouch and has been emptying on her own.  Will order supplies for discharge.  Will enroll in secure start.  Enrolled patient in [place] program: Yes [place] team will follow.  Domenic Moras MSN, RN, FNP-BC [facility] Wound, Ostomy, Continence Nurse Pager [phone]

## 2018-09-04 NOTE — Discharge Instructions (Addendum)
POST OP INSTRUCTIONS AFTER COLON SURGERY  1. DIET: Be sure to include lots of fluids daily to stay hydrated - 64oz of water per day (8, 8 oz glasses).  Avoid fast food or heavy meals for the first couple of weeks as your are more likely to get nauseated. Avoid raw/uncooked fruits or vegetables for the first 4 weeks (its ok to have these if they are blended into smoothie form). If you have fruits/vegetables, make sure they are cooked until soft enough to mash on the roof of your mouth and chew your food well. Otherwise, diet as tolerated.  2. Take your usually prescribed home medications unless otherwise directed.  3. PAIN CONTROL: a. Pain is best controlled by a usual combination of three different methods TOGETHER: i. Ice/Heat ii. Over the counter pain medication iii. Prescription pain medication b. Most patients will experience some swelling and bruising around the surgical site.  Ice packs or heating pads (30-60 minutes up to 6 times a day) will help. Some people prefer to use ice alone, heat alone, alternating between ice & heat.  Experiment to what works for you.  Swelling and bruising can take several weeks to resolve.   c. It is helpful to take an over-the-counter pain medication regularly for the first few weeks: i. Ibuprofen (Motrin/Advil) - 200mg  tabs - take 3 tabs (600mg ) every 6 hours as needed for pain (unless you have been directed previously to avoid NSAIDs/ibuprofen) ii. Acetaminophen (Tylenol) - you may take 650mg  every 6 hours as needed. You can take this with motrin as they act differently on the body. If you are taking a narcotic pain medication that has acetaminophen in it, do not take over the counter tylenol at the same time. iii. NOTE: You may take both of these medications together - most patients  find it most helpful when alternating between the two (i.e. Ibuprofen at 6am, tylenol at 9am, ibuprofen at 12pm ...) d. A  prescription for pain medication should be given to you  upon discharge.  Take your pain medication as prescribed if your pain is not adequatly controlled with the over-the-counter pain reliefs mentioned above.  4. Avoid getting constipated.  Between the surgery and the pain medications, it is common to experience some constipation.  Increasing fluid intake and taking a fiber supplement (such as Metamucil, Citrucel, FiberCon, MiraLax, etc) 1-2 times a day regularly will usually help prevent this problem from occurring.  A mild laxative (prune juice, Milk of Magnesia, MiraLax, etc) should be taken according to package directions if there are no bowel movements after 48 hours.    5. Dressing: Your incisions on the abdomen are covered in Dermabond which is like sterile superglue for the skin. This will come off on it's own in a couple weeks. It is waterproof and you may bathe normally starting the day after your surgery in a shower. Avoid baths/pools/lakes/oceans until your wounds have fully healed. Your perineum has sutures in place that will be removed at your follow-up appointment (~2 weeks). It's ok to get this wet in the shower as well. Do not submerge under water (bath/pool/lake/ocean) until completely healed and sutures are removed. 6. JP Drain: empty and record drain outputs at home and keep a log of how much (in milliliters) is emptied over a 24 period - as should have been instructed by your nurse prior to discharge. We will plan to have you follow-up in our office later this week (~Friday) to assess for drain removal. Bring the  log with you to your appointment  7. ACTIVITIES as tolerated:   a. Avoid heavy lifting (>10lbs or 1 gallon of milk) for the next 6 weeks. b. You may resume regular daily activities as tolerated--such as daily self-care, walking, climbing stairs--gradually increasing activities as tolerated.  If you can walk 30 minutes without difficulty, it is safe to try more intense activity such as jogging, treadmill, bicycling, low-impact  aerobics.  c. DO NOT PUSH THROUGH PAIN.  Let pain be your guide: If it hurts to do something, don't do it. d. Dennis Bast may drive when you are no longer taking prescription pain medication, you can comfortably wear a seatbelt, and you can safely maneuver your car and apply brakes.  8. FOLLOW UP in our office a. Please call [facility] at [phone] to set up an appointment to see your surgeon in the office for a follow-up appointment approximately 2 weeks after your surgery. b. Make sure that you call for this appointment the day you arrive home to insure a convenient appointment time.  9. If you have disability or family leave forms that need to be completed, you may have them completed by your primary care physician's office; for return to work instructions, please ask our office staff and they will be happy to assist you in obtaining this documentation   When to call us [phone]: 1. Poor pain control 2. Reactions / problems with new medications (rash/itching, etc)  3. Fever over 101.5 F (38.5 C) 4. Inability to urinate 5. Nausea/vomiting 6. Worsening swelling or bruising 7. Continued bleeding from incision. 8. Increased pain, redness, or drainage from the incision  The clinic staff is available to answer your questions during regular business hours (8:30am-5pm).  Please dont hesitate to call and ask to speak to one of our nurses for clinical concerns.   A surgeon from [facility] is always on call at the hospitals   If you have a medical emergency, go to the nearest emergency room or call 911.  [facility], [place] [address], [place], [place], [place]  [address] MAIN: [phone] FAX: [phone] [email or web address]

## 2018-09-04 NOTE — Progress Notes (Addendum)
Occupational Therapy Treatment Patient Details Name: Tina Morales MRN: [id number] DOB: 1966-04-06 Today's Date: 09/04/2018    History of present illness 53 y.o. female admitted with rectal cancer, s/p resection with end colostomy.  History of anxiety.    OT comments  Pt progressing towards established OT goals. Pt performing UB ADLs and functional mobility with Min Guard A. Pt demonstrating decreased dynamic balance as seen by increased swaying during mobility in hallway. Educating pt and husband on positional changes in chair for pressure and pain relief. Discussed with RN for order of geo-mat for recliner. Continue to recommend dc home once medically stable per physician. Will continue to follow acutely as admitted.    Follow Up Recommendations  No OT follow up;Supervision/Assistance - 24 hour    Equipment Recommendations  Other (comment)   Recommendations for Other Services      Precautions / Restrictions Precautions Precautions: Fall Precaution Comments: colostomy; pt deneis h/o falls Restrictions Weight Bearing Restrictions: No       Mobility Bed Mobility Overal bed mobility: Modified Independent             General bed mobility comments: Increased time  Transfers Overall transfer level: Needs assistance Equipment used: Rolling walker (2 wheeled) Transfers: Sit to/from Stand Sit to Stand: From elevated surface;Min guard         General transfer comment: VCs hand placement    Balance Overall balance assessment: Modified Independent                                         ADL either performed or assessed with clinical judgement   ADL Overall ADL's : Needs assistance/impaired                 Upper Body Dressing : Min guard;Standing Upper Body Dressing Details (indicate cue type and reason): Min Guard A for donning robe     Toilet Transfer: Min guard;Ambulation(Simulated to reciner) Armed forces technical officer Details (indicate cue type and  reason): Min Guard A for safety.          Functional mobility during ADLs: Min guard(Hallway) General ADL Comments: Pt with slight balance deficits swaying at times in hallway. No LOB and need for assist. Discussed ways to releave pressure while sitting in recliner. Also discussed edema and pain reduction through activity.     Vision       Perception     Praxis      Cognition Arousal/Alertness: Awake/alert Behavior During Therapy: WFL for tasks assessed/performed Overall Cognitive Status: Within Functional Limits for tasks assessed                                          Exercises     Shoulder Instructions       General Comments husband at bedside    Pertinent Vitals/ Pain       Pain Assessment: Faces Faces Pain Scale: Hurts even more Pain Location: perineal area with walking Pain Descriptors / Indicators: Sore Pain Intervention(s): Monitored during session;Limited activity within patient's tolerance;Repositioned  Home Living                                          Prior Functioning/Environment  Frequency  Min 2X/week        Progress Toward Goals  OT Goals(current goals can now be found in the care plan section)  Progress towards OT goals: Progressing toward goals  Acute Rehab OT Goals Patient Stated Goal: likes to play drums and guitar OT Goal Formulation: With patient Time For Goal Achievement: 09/10/18 Potential to Achieve Goals: Good ADL Goals Pt Will Perform Lower Body Bathing: with supervision;sit to/from stand Pt Will Perform Lower Body Dressing: sit to/from stand;with supervision Pt Will Transfer to Toilet: ambulating;with supervision  Plan Discharge plan remains appropriate    Co-evaluation                 AM-PAC OT "6 Clicks" Daily Activity     Outcome Measure   Help from another person eating meals?: None Help from another person taking care of personal grooming?: A  Little Help from another person toileting, which includes using toliet, bedpan, or urinal?: A Little Help from another person bathing (including washing, rinsing, drying)?: A Little Help from another person to put on and taking off regular upper body clothing?: A Little Help from another person to put on and taking off regular lower body clothing?: A Little 6 Click Score: 19    End of Session    OT Visit Diagnosis: Pain Pain - Right/Left: (rectum and abdomen) Pain - part of body: (rectum and abdomen)   Activity Tolerance Patient tolerated treatment well   Patient Left in chair;with call bell/phone within reach;with family/visitor present   Nurse Communication Other (comment)(PT updated nurse on BP and pt may need colostomy bag emptied)        Time: 6553-7482 OT Time Calculation (min): 11 min  Charges: OT General Charges $OT Visit: 1 Visit OT Treatments $Self Care/Home Management : 8-22 mins  Tina Morales MSOT, OTR/L Acute Rehab Pager: [phone] Office: [place] 09/04/2018, 1:22 PM

## 2018-09-04 NOTE — Progress Notes (Signed)
ERAS education reinforced. Did patient attend class prior to procedure? Yes [ x  ] No [   ] Discussed: Pain Control [ x  ] Mobility [ x  ] Diet [ x  ] Other [   ] Sitting in chair. Using IS regularly. Walking in hallways. Pain is well controlled on oral meds. Will follow. Pecolia Ades, RN, BSN Quality Program Coordinator, Enhanced Recovery after Surgery 09/04/18 3:31 PM

## 2018-09-05 NOTE — Progress Notes (Signed)
  Oncology Nurse Navigator Documentation  Met with patient and her husband prior to D/C to assess for navigation needs. Patient voiced that she did not feel comfortable with her ostomy. "I've only met with the ostomy nurse one time and I have questions about how and when I will get my supplies." Nursing made aware and I requested that patient meet with ostomy nurse again prior to d/c. Patient and husband voiced appreciation.  Arna Snipe, MS Ed.S, RN -[facility]  Gastrointestinal Oncology Nurse [place] at [place] [phone]

## 2018-09-05 NOTE — Consult Note (Signed)
[place] Nurse ostomy follow up Stoma type/location: LUQ colostomy Stomal assessment/size: Per yesterday Peristomal assessment: Not seen today Treatment options for stomal/peristomal skin: N/A Output: liquid brown stool.  Patient emptied into toilet today. Ostomy pouching: 1pc.convex applied yesterday is intact.  Education provided: Patient and spouse asked to see [place] Nurse again to review supplies and answer a few other questions regarding diet, etc.   Enrolled patient in [place] Start Discharge program: Yes  [facility] nursing team will  follow, and  will remain available to this patient, the nursing and medical teams.   Thanks, Maudie Flakes, MSN, RN, [place], [facility]  Pager# [phone]

## 2018-09-05 NOTE — Care Management Note (Signed)
Case Management Note  Patient Details  Name: Tina Morales MRN: [id number] Date of Birth: [birth year]  Subjective/Objective:  Rectal Ca. Fromhome. PT recc home rw, & ordered-Patient ambulates independently,& declined home rw. No further CM needs.                 Action/Plan:dc home.   Expected Discharge Date:  09/05/18               Expected Discharge Plan:  Home/Self Care  In-House Referral:     Discharge planning Services  CM Consult  Post Acute Care Choice:    Choice offered to:     DME Arranged:    DME Agency:     HH Arranged:    HH Agency:     Status of Service:  Completed, signed off  If discussed at [employer] of Stay Meetings, dates discussed:    Additional Comments:  Dessa Phi, RN 09/05/2018, 11:34 AM

## 2018-09-05 NOTE — Progress Notes (Signed)
Pt BP is 88/66 pulse 74. Throughout the shift she has been (83-95)/(52-74) pulse (63-74). She has been asymptomatic. On-call MD Dr, Johney Maine paged. MD stated to notify the surgeon due to it being end of shift. This will be passed on to the day nurse. She remains asymptomatic. Will continue to monitor pt.

## 2018-09-11 ENCOUNTER — Ambulatory Visit: Payer: Self-pay | Admitting: Radiation Oncology

## 2018-09-11 ENCOUNTER — Telehealth: Payer: Self-pay

## 2018-09-11 ENCOUNTER — Telehealth: Payer: Self-pay | Admitting: *Deleted

## 2018-09-11 NOTE — Telephone Encounter (Signed)
error 

## 2018-09-11 NOTE — Telephone Encounter (Signed)
Called to request port flush on 09/14/18 when here to see Dr. Benay Spice. Sent scheduling message for this to be added to the appointment.

## 2018-09-12 ENCOUNTER — Other Ambulatory Visit: Payer: Self-pay | Admitting: *Deleted

## 2018-09-12 DIAGNOSIS — C20 Malignant neoplasm of rectum: Secondary | ICD-10-CM

## 2018-09-12 MED ORDER — ALPRAZOLAM 0.5 MG PO TABS: 0.5000 mg | ORAL_TABLET | Freq: Two times a day (BID) | ORAL | 0 refills | Status: DC | PRN

## 2018-09-12 NOTE — Progress Notes (Signed)
Patient left VM needing refill on her alprazolam. Pharmacy told her they sent refill request and have not heard from office yet. No record of refill request noted. Called in refill as requested to automated line

## 2018-09-13 ENCOUNTER — Telehealth: Payer: Self-pay | Admitting: Radiation Oncology

## 2018-09-13 DIAGNOSIS — C20 Malignant neoplasm of rectum: Secondary | ICD-10-CM

## 2018-09-13 NOTE — Telephone Encounter (Signed)
I called the patient following our discussion of her case in conference. She did have a few pulmonary nodules seen at the time of her work up for her rectal cancer.  While they were suspicious for disease, she was counseled on following these after treatment, and she went on to have chemoRT. She's healing from surgery, and we discussed that we would like to reimage the chest again mid March prior to considering SBRT. Orders will be placed and she will be contacted to coordinate the scan.

## 2018-09-14 ENCOUNTER — Inpatient Hospital Stay: Payer: 59

## 2018-09-14 ENCOUNTER — Other Ambulatory Visit: Payer: Self-pay

## 2018-09-14 ENCOUNTER — Telehealth: Payer: Self-pay | Admitting: Oncology

## 2018-09-14 ENCOUNTER — Inpatient Hospital Stay: Payer: 59 | Attending: Oncology | Admitting: Oncology

## 2018-09-14 VITALS — BP 88/64 | HR 76 | Temp 97.6°F | Resp 18 | Ht 67.0 in | Wt 129.2 lb

## 2018-09-14 DIAGNOSIS — Z923 Personal history of irradiation: Secondary | ICD-10-CM | POA: Insufficient documentation

## 2018-09-14 DIAGNOSIS — Z90710 Acquired absence of both cervix and uterus: Secondary | ICD-10-CM | POA: Insufficient documentation

## 2018-09-14 DIAGNOSIS — Z933 Colostomy status: Secondary | ICD-10-CM | POA: Diagnosis not present

## 2018-09-14 DIAGNOSIS — Z90722 Acquired absence of ovaries, bilateral: Secondary | ICD-10-CM | POA: Diagnosis not present

## 2018-09-14 DIAGNOSIS — C20 Malignant neoplasm of rectum: Secondary | ICD-10-CM

## 2018-09-14 DIAGNOSIS — T8131XA Disruption of external operation (surgical) wound, not elsewhere classified, initial encounter: Secondary | ICD-10-CM | POA: Insufficient documentation

## 2018-09-14 DIAGNOSIS — Z79899 Other long term (current) drug therapy: Secondary | ICD-10-CM | POA: Diagnosis not present

## 2018-09-14 DIAGNOSIS — Z87891 Personal history of nicotine dependence: Secondary | ICD-10-CM | POA: Diagnosis not present

## 2018-09-14 DIAGNOSIS — C7982 Secondary malignant neoplasm of genital organs: Secondary | ICD-10-CM | POA: Diagnosis present

## 2018-09-14 DIAGNOSIS — C7800 Secondary malignant neoplasm of unspecified lung: Secondary | ICD-10-CM | POA: Insufficient documentation

## 2018-09-14 DIAGNOSIS — Z9079 Acquired absence of other genital organ(s): Secondary | ICD-10-CM | POA: Diagnosis not present

## 2018-09-14 DIAGNOSIS — Z9221 Personal history of antineoplastic chemotherapy: Secondary | ICD-10-CM | POA: Insufficient documentation

## 2018-09-14 DIAGNOSIS — R918 Other nonspecific abnormal finding of lung field: Secondary | ICD-10-CM | POA: Diagnosis not present

## 2018-09-14 DIAGNOSIS — R102 Pelvic and perineal pain: Secondary | ICD-10-CM | POA: Diagnosis not present

## 2018-09-14 DIAGNOSIS — Z95828 Presence of other vascular implants and grafts: Secondary | ICD-10-CM

## 2018-09-14 MED ORDER — TRAMADOL HCL 50 MG PO TABS: 50.0000 mg | ORAL_TABLET | Freq: Four times a day (QID) | ORAL | 0 refills | Status: DC | PRN

## 2018-09-14 MED ORDER — SODIUM CHLORIDE 0.9% FLUSH
10.0000 mL | Freq: Once | INTRAVENOUS | Status: AC
  Administered 2018-09-14: 10 mL
  Filled 2018-09-14: qty 10

## 2018-09-14 MED ORDER — HEPARIN SOD (PORK) LOCK FLUSH 100 UNIT/ML IV SOLN
500.0000 [IU] | Freq: Once | INTRAVENOUS | Status: AC
  Administered 2018-09-14: 500 [IU]
  Filled 2018-09-14: qty 5

## 2018-09-14 NOTE — Telephone Encounter (Signed)
Scheduled appt per 3/5 los - patient is aware of appt date and time

## 2018-09-14 NOTE — Progress Notes (Signed)
[facility] OFFICE PROGRESS NOTE   Diagnosis: Rectal cancer  INTERVAL HISTORY:   Ms. Bochicchio returns as scheduled.  She underwent a robotic assisted APR and vaginectomy on 09/01/2018.  She continues to recover from the procedure.  She was found to have a distal rectal cancer with involvement of the posterior wall of the vagina. The pathology field invasive adenocarcinoma well differentiated.  Lymphovascular invasion was identified.  11 lymph nodes were negative for metastatic carcinoma.  The resection margins are negative.  Adenocarcinoma extended through the muscularis propria into pericolonic soft tissue.  No tumor deposits.  She is recovering from surgery.  The colostomy is functioning well.  She has a drain in the right lower quadrant.  She continues to empty the drain several times per day.  She has pain at the perineal scar.  She takes tramadol for relief of pain.  Objective:  Vital signs in last 24 hours:  Blood pressure (!) 88/64, pulse 76, temperature 97.6 F (36.4 C), temperature source Oral, resp. rate 18, height 5' 7"  (1.702 m), weight 129 lb 3.2 oz (58.6 kg), SpO2 100 %.    Resp: Lungs clear bilaterally Cardio: Regular rate and rhythm GI: No hepatosplenomegaly, right lower quadrant drain with clear fluid, left lower quadrant colostomy Vascular: No leg edema  Skin: Healed perineal incision with sutures in place  Portacath/PICC-without erythema  Lab Results:  Lab Results  Component Value Date   WBC 11.6 (H) 09/04/2018   HGB 8.2 (L) 09/04/2018   HCT 28.4 (L) 09/04/2018   MCV 72.1 (L) 09/04/2018   PLT 312 09/04/2018   NEUTROABS 7.9 (H) 08/30/2018    CMP  Lab Results  Component Value Date   NA 135 09/04/2018   K 3.7 09/04/2018   CL 101 09/04/2018   CO2 27 09/04/2018   GLUCOSE 101 (H) 09/04/2018   BUN 10 09/04/2018   CREATININE 0.55 09/04/2018   CALCIUM 8.1 (L) 09/04/2018   PROT 7.4 08/30/2018   ALBUMIN 4.2 08/30/2018   AST 16 08/30/2018   ALT  9 08/30/2018   ALKPHOS 102 08/30/2018   BILITOT 0.7 08/30/2018   GFRNONAA >60 09/04/2018   GFRAA >60 09/04/2018    Lab Results  Component Value Date   CEA1 3.09 08/07/2018    Lab Results  Component Value Date   INR 1.08 08/30/2018    Imaging:  No results found.  Medications: I have reviewed the patient's current medications.   Assessment/Plan:  1. Rectal cancer-rectal mass noted on digital exam 02/10/2018, colonoscopy confirmed a him my circumferential mass in the rectum ? 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 CTs 02/10/2018-anterior rectal wall thickening, pulmonary nodules measuring up to 9 mm concerning for metastases, few round perirectal lymph nodes ? Pelvic MRI 02/20/2018-hypermetabolic low rectal mass extending to the posterior vagina with 2 small enlarged perirectal lymph nodes, T4b,N1-2.3 cm from the anal verge ? PET scan 02/23/2018-hypermetabolic rectal mass, 8 mm hypermetabolic lingular nodule, scattered small bilateral lung nodules, some calcified, a few with mildly increased activity ? Cycle 1 FOLFOXIRI8/21/2019 ? Cycle 5 FOLFOXIRI10/16/2019 ? CTs 05/01/2018-significant interval response to therapy with decreased size of the primary rectal mass lesion. Decreasing perirectal lymphadenopathy. Decreased and/or resolved pulmonary nodules. No new sites of disease identified. ? Cycle 6 FOLFOXIRI10/31/2019 ? Radiation/Xeloda 06/12/2018-completed 07/21/2018 ? Xeloda dose reduced 07/10/2018 due to  mucositis and diarrhea ? CTs 08/07/2018- compared to 02/10/2018- resolved and decreased pulmonary nodules, few tiny residual noncalcified nodules, decreased 20 perirectal lymph nodes, soft tissue of the rectum indistinguishable from posterior wall  of vagina-  Vaginal involvement by tumor? ? 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   Diarrhea secondary to Xeloda and radiation.  Imodium as needed. 6.   Mucositis secondary to Xeloda.  Improved 07/19/2018.   Disposition: Ms. Bari is recovering from the APR procedure.  Her case was discussed at the GI tumor conference on 09/13/2018.  Review of the pathology slides is consistent with involvement of the vagina and therefore a T4 lesion.  I discussed treatment options with Ms. Rybicki.  She understands the hypermetabolic lung nodules at presentation are likely an indicator of metastatic disease.  We discussed observation versus considering metastatectomy or SBRT.  She is recovering from surgery would not be a thoracic surgical candidate at present.  She plans to see Dr. Lisbeth Renshaw to discuss SBRT.  I think it would be reasonable to repeat a chest CT and consider SBRT or a metastectomy procedure over the next several months if she continues to have a ligamentous static disease in the chest.   She completed an extensive course of neoadjuvant therapy.  We plan to place systemic therapy on hold for now.  I refilled her prescription for tramadol.  She will return for an office visit and Port-A-Cath flush in 6 weeks. Betsy Coder, MD  09/14/2018  1:37 PM

## 2018-09-15 ENCOUNTER — Encounter: Payer: Self-pay | Admitting: *Deleted

## 2018-09-15 NOTE — Progress Notes (Signed)
Received faxed request for last office note and completion of form to authorize the change in her tramadol dose to every 6 hours from twice daily. Form completed and faxed office note and operative report to [phone] [facility] [employer]).

## 2018-09-21 NOTE — Progress Notes (Signed)
Consult Note: Gyn-Onc  Consult was requested by Dr. Dema Severin for the evaluation of Tina Morales 53 y.o. female  CC:  Chief Complaint  Patient presents with  . Rectal cancer [facility])    Assessment/Plan:  Tina Morales  is a 53 y.o.  year old with a history ofstage IV and locally advanced rectal cancer with posterior vaginal wall involvement who is s/p APR (robotic) and radical lower vaginectomy on 09/01/18.  She is healing well postop but has significant perineal pain (though with no underlying source (no apparent infection or hematoma).  Recommended vaginal premarin to assist with healing once pain has subsided (I will see her back in 2 weeks and prescribe this if she is more comfortable).  Recommend use of vaginal dilator when tissues have adequately healed.  HPI: Ms Yanitza Morales is a 53 year old P0 who was seen in consultation at the request of Dr Dema Severin for locally advanced rectal cancer with vaginal involvement.  The patient began experiencing some discomfort with intercourse and GI discomfort in August 2019.  She underwent a pelvic examination with a transvaginal ultrasound scan and this was unremarkable, therefore was referred for colonoscopy.  Colonoscopy on 02/10/2018 revealed a circumferential mass in the rectum with biopsy confirming a tubular adenoma with at least high-grade dysplasia.  Review of the specimen with a digestive health specialist confirmed intramucosal adenocarcinoma.  CT scan on 02/10/2018 revealed anterior rectal wall thickening, pulmonary nodules measuring up to 9 mm concerning for metastases, and a few round perirectal lymph nodes.  Pelvic MRI on 02/20/2018 revealed hypermetabolic low rectal mass extending from the posterior vagina with 2 small and large perirectal lymph nodes.  A PET scan on 02/23/2018 confirmed and hypermetabolic rectal mass, small scattered bilateral lung nodules, a few with mildly increased activity.  She underwent chemotherapy with 6 cycles of FOLFOXIRI  between 03/01/2018 and 05/11/2018.  She received radiation with Xeloda until 07/21/18.  Based on repeat imaging she had good response to therapy and was recommended to be considered for APR with Dr Annye English on 09/01/18.  The patient is otherwise very healthy.  She has never been pregnant.  She is postmenopausal prior to radiation.  She is never had an abnormal Pap smear.  Interval Hx:  On 09/01/18 she underwent robotic assisted abdomino-perineal resection with radical distal posterior vaginectomy. The surgery was uncomplicated. Margins on the specimen were negative. The vagina was retubularized/ reconstructed intraop but significantly narrowed.   Since surgery she has had significant perineal pain but no other concerns.  No fevers. She does have some light drainage (clear fluid) from the vagina.   Current Meds:  Outpatient Encounter Medications as of 09/22/2018  Medication Sig  . acetaminophen (TYLENOL) 500 MG tablet Take 1,000 mg by mouth every 8 (eight) hours as needed (PAIN).   Marland Kitchen ALPRAZolam (XANAX) 0.5 MG tablet Take 1 tablet (0.5 mg total) by mouth 2 (two) times daily as needed for anxiety.  . cyclobenzaprine (FLEXERIL) 5 MG tablet Take 1 tablet (5 mg total) by mouth 3 (three) times daily as needed for muscle spasms.  . diphenoxylate-atropine (LOMOTIL) 2.5-0.025 MG tablet Take 2 tablets by mouth 4 (four) times daily as needed for diarrhea or loose stools.  . ferrous sulfate 325 (65 FE) MG EC tablet Take 1 tablet (325 mg total) by mouth 2 (two) times daily with a meal.  . gabapentin (NEURONTIN) 300 MG capsule Take 1 capsule (300 mg total) by mouth 3 (three) times daily.  . Ibuprofen 200 MG  CAPS Take 600 mg by mouth daily.  Marland Kitchen lidocaine-prilocaine (EMLA) cream Apply 1 application topically as needed.  . Multiple Vitamins-Minerals (MULTIVITAMIN WITH MINERALS) tablet Take 1 tablet by mouth daily.  Marland Kitchen omeprazole (PRILOSEC) 20 MG capsule Take 20 mg by mouth daily.  . ondansetron (ZOFRAN) 8 MG  tablet Take 1 tablet (8 mg total) by mouth every 12 (twelve) hours as needed for nausea or vomiting.  . Polyethylene Glycol 3350 (MIRALAX PO) Take 17 g by mouth daily as needed.  . prochlorperazine (COMPAZINE) 10 MG tablet Take 1 tablet (10 mg total) by mouth every 6 (six) hours as needed for nausea or vomiting.  . traMADol (ULTRAM) 50 MG tablet Take 1 tablet (50 mg total) by mouth every 6 (six) hours as needed.  . [DISCONTINUED] cyclobenzaprine (FLEXERIL) 5 MG tablet Take 5 mg by mouth 3 (three) times daily as needed for muscle spasms.    No facility-administered encounter medications on file as of 09/22/2018.     Allergy:  Allergies  Allergen Reactions  . Sulfa Antibiotics     Patient unaware of side effects, she was told when she was a child that she was allergic    Social Hx:   Social History   Socioeconomic History  . Marital status: Married    Spouse name: Not on file  . Number of children: Not on file  . Years of education: Not on file  . Highest education level: Not on file  Occupational History  . Not on file  Social Needs  . Financial resource strain: Not on file  . Food insecurity:    Worry: Not on file    Inability: Not on file  . Transportation needs:    Medical: No    Non-medical: No  Tobacco Use  . Smoking status: Former Smoker    Years: 30.00    Last attempt to quit: 02/2018    Years since quitting: 0.6  . Smokeless tobacco: Never Used  Substance and Sexual Activity  . Alcohol use: Not Currently    Frequency: Never    Comment: per Dr Dema Severin chart, "no drink in the last 5 years"  . Drug use: Never  . Sexual activity: Not on file  Lifestyle  . Physical activity:    Days per week: Not on file    Minutes per session: Not on file  . Stress: Not on file  Relationships  . Social connections:    Talks on phone: Not on file    Gets together: Not on file    Attends religious service: Not on file    Active member of club or organization: Not on file     Attends meetings of clubs or organizations: Not on file    Relationship status: Not on file  . Intimate partner violence:    Fear of current or ex partner: Not on file    Emotionally abused: Not on file    Physically abused: Not on file    Forced sexual activity: Not on file  Other Topics Concern  . Not on file  Social History Narrative  . Not on file    Past Surgical Hx:  Past Surgical History:  Procedure Laterality Date  . ADENOIDECTOMY    . CYSTOSCOPY W/ RETROGRADES Bilateral 09/01/2018   Procedure: CYSTOSCOPY WITH BILATERAL  RETROGRADE PYELOGRAM;  Surgeon: Alexis Frock, MD;  Location: [facility];  Service: Urology;  Laterality: Bilateral;  . CYSTOSCOPY WITH STENT PLACEMENT Bilateral 09/01/2018   Procedure: CYSTOSCOPY WITH URETERAL COOK  CATHETER PLACEMENT;  Surgeon: Alexis Frock, MD;  Location: [facility];  Service: Urology;  Laterality: Bilateral;  . ROBOTIC ASSISTED TOTAL HYSTERECTOMY WITH BILATERAL SALPINGO OOPHERECTOMY Bilateral 09/01/2018   Procedure: XI ROBOTIC ASSISTED PARTIAL VAGINECTOMY;  Surgeon: Everitt Amber, MD;  Location: [facility];  Service: Gynecology;  Laterality: Bilateral;  . TONSILLECTOMY      Past Medical Hx:  Past Medical History:  Diagnosis Date  . Adenocarcinoma ([place]) 02/2018  . Anxiety   . Cancer ([place]) 02/10/2018   Stage 4 Colorectal Cancer, spot on her lungs  . Migraine     Past Gynecological History:  See HPI No LMP recorded. Patient is postmenopausal.  Family Hx:  Family History  Problem Relation Age of Onset  . Breast cancer Maternal Aunt   . Throat cancer Cousin   . Cancer Cousin   . Throat cancer Cousin   . Lung cancer Paternal Uncle     Review of Systems:  Constitutional  Feels well,    ENT Normal appearing ears and nares bilaterally Skin/Breast  No rash, sores, jaundice, itching, dryness Cardiovascular  No chest pain, shortness of breath, or edema  Pulmonary  No cough or wheeze.  Gastro Intestinal  No nausea, vomitting, or  diarrhoea. No bright red blood per rectum, no abdominal pain, change in bowel movement, or constipation.  Genito Urinary  No frequency, urgency, dysuria, no bleeding Musculo Skeletal  No myalgia, arthralgia, joint swelling or pain  Neurologic  No weakness, numbness, change in gait,  Psychology  No depression, anxiety, insomnia.   Vitals:  Blood pressure (!) 88/65, pulse 98, temperature 98.3 F (36.8 C), temperature source Oral, resp. rate 16, height 5\' 6"  (1.676 m), weight 133 lb (60.3 kg), SpO2 100 %.  Physical Exam: WD in NAD Neck  Supple NROM, without any enlargements.  Lymph Node Survey No cervical supraclavicular or inguinal adenopathy Cardiovascular  Pulse normal rate, regularity and rhythm. S1 and S2 normal.  Lungs  Clear to auscultation bilateraly, without wheezes/crackles/rhonchi. Good air movement.  Skin  No rash/lesions/breakdown  Psychiatry  Alert and oriented to person, place, and time  Abdomen  Normoactive bowel sounds, abdomen soft, non-tender and thin without evidence of hernia. Incisions well healed. Stoma functioning Back No CVA tenderness Genito Urinary  The perineum is soft. The incision line is clean and intact with permanent mattress sutures. There is no palpable hematoma and no signs of cellulitis. Attempted to probe vagina with single digit - too uncomfortable for patient.  Rectal : NA Extremities  No bilateral cyanosis, clubbing or edema.   Thereasa Solo, MD  09/22/2018, 2:27 PM

## 2018-09-22 ENCOUNTER — Inpatient Hospital Stay (HOSPITAL_BASED_OUTPATIENT_CLINIC_OR_DEPARTMENT_OTHER): Payer: 59 | Admitting: Gynecologic Oncology

## 2018-09-22 ENCOUNTER — Encounter: Payer: Self-pay | Admitting: Gynecologic Oncology

## 2018-09-22 ENCOUNTER — Other Ambulatory Visit: Payer: Self-pay

## 2018-09-22 VITALS — BP 88/65 | HR 98 | Temp 98.3°F | Resp 16 | Ht 66.0 in | Wt 133.0 lb

## 2018-09-22 DIAGNOSIS — C20 Malignant neoplasm of rectum: Secondary | ICD-10-CM | POA: Diagnosis not present

## 2018-09-22 DIAGNOSIS — Z923 Personal history of irradiation: Secondary | ICD-10-CM

## 2018-09-22 DIAGNOSIS — Z9079 Acquired absence of other genital organ(s): Secondary | ICD-10-CM

## 2018-09-22 DIAGNOSIS — Z90710 Acquired absence of both cervix and uterus: Secondary | ICD-10-CM

## 2018-09-22 DIAGNOSIS — Z90722 Acquired absence of ovaries, bilateral: Secondary | ICD-10-CM

## 2018-09-22 DIAGNOSIS — R918 Other nonspecific abnormal finding of lung field: Secondary | ICD-10-CM

## 2018-09-22 DIAGNOSIS — C7982 Secondary malignant neoplasm of genital organs: Secondary | ICD-10-CM

## 2018-09-22 DIAGNOSIS — R102 Pelvic and perineal pain: Secondary | ICD-10-CM

## 2018-09-22 DIAGNOSIS — Z9221 Personal history of antineoplastic chemotherapy: Secondary | ICD-10-CM

## 2018-09-22 MED ORDER — CYCLOBENZAPRINE HCL 5 MG PO TABS: 5.0000 mg | ORAL_TABLET | Freq: Three times a day (TID) | ORAL | 3 refills | Status: DC | PRN

## 2018-09-22 NOTE — Patient Instructions (Signed)
Please notify Dr Denman George at phone number [phone] if you notice vaginal bleeding, new pelvic or abdominal pains, bloating, feeling full easy, or a change in bladder or bowel function.   Please return to see Dr Denman George in 2 weeks to recheck the vagina. She will prescribe vaginal cream at that time if you are adequately healed.  Dr Denman George prescribed flexeril for you.

## 2018-09-25 ENCOUNTER — Ambulatory Visit: Payer: Self-pay | Admitting: Radiation Oncology

## 2018-09-28 ENCOUNTER — Telehealth: Payer: Self-pay | Admitting: *Deleted

## 2018-09-28 NOTE — Telephone Encounter (Signed)
Called and moved the patient's appt up to an earlier time on 3/25

## 2018-09-29 ENCOUNTER — Encounter (HOSPITAL_COMMUNITY): Payer: Self-pay

## 2018-09-29 ENCOUNTER — Ambulatory Visit (HOSPITAL_COMMUNITY)
Admission: RE | Admit: 2018-09-29 | Discharge: 2018-09-29 | Disposition: A | Discharge status: Home / Self Care | Payer: 59 | Source: Ambulatory Visit | Attending: Radiation Oncology | Admitting: Radiation Oncology

## 2018-09-29 ENCOUNTER — Other Ambulatory Visit: Payer: Self-pay

## 2018-09-29 DIAGNOSIS — C20 Malignant neoplasm of rectum: Secondary | ICD-10-CM | POA: Insufficient documentation

## 2018-09-29 MED ORDER — SODIUM CHLORIDE (PF) 0.9 % IJ SOLN
INTRAMUSCULAR | Status: AC
  Filled 2018-09-29: qty 50

## 2018-09-29 MED ORDER — IOHEXOL 300 MG/ML  SOLN
75.0000 mL | Freq: Once | INTRAMUSCULAR | Status: AC | PRN
  Administered 2018-09-29: 75 mL via INTRAVENOUS

## 2018-10-02 ENCOUNTER — Other Ambulatory Visit: Payer: Self-pay | Admitting: Oncology

## 2018-10-02 NOTE — Telephone Encounter (Signed)
Pt requesting refill of her Tramadol. Last filled on 09/14/18

## 2018-10-03 ENCOUNTER — Other Ambulatory Visit: Payer: Self-pay | Admitting: Student

## 2018-10-03 ENCOUNTER — Telehealth: Payer: Self-pay | Admitting: *Deleted

## 2018-10-03 ENCOUNTER — Other Ambulatory Visit: Payer: Self-pay | Admitting: Oncology

## 2018-10-03 DIAGNOSIS — K6289 Other specified diseases of anus and rectum: Secondary | ICD-10-CM

## 2018-10-03 MED ORDER — TRAMADOL HCL 50 MG PO TABS: 50.0000 mg | ORAL_TABLET | Freq: Four times a day (QID) | ORAL | 0 refills | Status: DC | PRN

## 2018-10-03 NOTE — Telephone Encounter (Signed)
Called and spoke with the patient regarding her appt for tomorrow. Patient has not been sick or been in contact with anyone sick or exposured. Patient has not traveled. Patient was advised no visitors.

## 2018-10-04 ENCOUNTER — Other Ambulatory Visit: Payer: Self-pay

## 2018-10-04 ENCOUNTER — Other Ambulatory Visit: Payer: Self-pay | Admitting: General Surgery

## 2018-10-04 ENCOUNTER — Other Ambulatory Visit: Payer: 59

## 2018-10-04 ENCOUNTER — Inpatient Hospital Stay (HOSPITAL_BASED_OUTPATIENT_CLINIC_OR_DEPARTMENT_OTHER): Payer: 59 | Admitting: Gynecologic Oncology

## 2018-10-04 ENCOUNTER — Encounter: Payer: Self-pay | Admitting: Gynecologic Oncology

## 2018-10-04 ENCOUNTER — Inpatient Hospital Stay (HOSPITAL_COMMUNITY)
Admission: AD | Admit: 2018-10-04 | Discharge: 2018-10-06 | DRG: 863 | Disposition: A | Discharge status: Home / Self Care | Payer: 59 | Source: Ambulatory Visit | Attending: Surgery | Admitting: Surgery

## 2018-10-04 ENCOUNTER — Other Ambulatory Visit: Payer: Self-pay | Admitting: Surgery

## 2018-10-04 ENCOUNTER — Encounter (HOSPITAL_COMMUNITY): Payer: Self-pay | Admitting: *Deleted

## 2018-10-04 ENCOUNTER — Ambulatory Visit
Admission: RE | Admit: 2018-10-04 | Discharge: 2018-10-04 | Disposition: A | Discharge status: Home / Self Care | Payer: 59 | Source: Ambulatory Visit | Attending: Student | Admitting: Student

## 2018-10-04 VITALS — BP 104/74 | HR 110 | Temp 98.5°F | Resp 16 | Ht 66.0 in | Wt 132.0 lb

## 2018-10-04 DIAGNOSIS — C20 Malignant neoplasm of rectum: Secondary | ICD-10-CM | POA: Diagnosis present

## 2018-10-04 DIAGNOSIS — Z87891 Personal history of nicotine dependence: Secondary | ICD-10-CM

## 2018-10-04 DIAGNOSIS — T8131XA Disruption of external operation (surgical) wound, not elsewhere classified, initial encounter: Secondary | ICD-10-CM | POA: Diagnosis present

## 2018-10-04 DIAGNOSIS — Z923 Personal history of irradiation: Secondary | ICD-10-CM | POA: Diagnosis not present

## 2018-10-04 DIAGNOSIS — C7800 Secondary malignant neoplasm of unspecified lung: Secondary | ICD-10-CM | POA: Diagnosis present

## 2018-10-04 DIAGNOSIS — T8130XA Disruption of wound, unspecified, initial encounter: Secondary | ICD-10-CM | POA: Insufficient documentation

## 2018-10-04 DIAGNOSIS — T148XXA Other injury of unspecified body region, initial encounter: Secondary | ICD-10-CM

## 2018-10-04 DIAGNOSIS — Z933 Colostomy status: Secondary | ICD-10-CM | POA: Diagnosis not present

## 2018-10-04 DIAGNOSIS — Z9221 Personal history of antineoplastic chemotherapy: Secondary | ICD-10-CM | POA: Diagnosis not present

## 2018-10-04 DIAGNOSIS — D509 Iron deficiency anemia, unspecified: Secondary | ICD-10-CM | POA: Diagnosis present

## 2018-10-04 DIAGNOSIS — K6289 Other specified diseases of anus and rectum: Secondary | ICD-10-CM

## 2018-10-04 DIAGNOSIS — R918 Other nonspecific abnormal finding of lung field: Secondary | ICD-10-CM | POA: Diagnosis not present

## 2018-10-04 DIAGNOSIS — D569 Thalassemia, unspecified: Secondary | ICD-10-CM | POA: Diagnosis present

## 2018-10-04 DIAGNOSIS — L089 Local infection of the skin and subcutaneous tissue, unspecified: Secondary | ICD-10-CM | POA: Diagnosis present

## 2018-10-04 DIAGNOSIS — Y833 Surgical operation with formation of external stoma as the cause of abnormal reaction of the patient, or of later complication, without mention of misadventure at the time of the procedure: Secondary | ICD-10-CM | POA: Diagnosis not present

## 2018-10-04 DIAGNOSIS — F418 Other specified anxiety disorders: Secondary | ICD-10-CM | POA: Diagnosis present

## 2018-10-04 DIAGNOSIS — Z95828 Presence of other vascular implants and grafts: Secondary | ICD-10-CM

## 2018-10-04 DIAGNOSIS — T8141XA Infection following a procedure, superficial incisional surgical site, initial encounter: Principal | ICD-10-CM | POA: Diagnosis present

## 2018-10-04 DIAGNOSIS — C799 Secondary malignant neoplasm of unspecified site: Secondary | ICD-10-CM | POA: Diagnosis not present

## 2018-10-04 DIAGNOSIS — C7982 Secondary malignant neoplasm of genital organs: Secondary | ICD-10-CM

## 2018-10-04 LAB — CBC
HCT: 32.7 % — ABNORMAL LOW (ref 36.0–46.0)
Hemoglobin: 9.6 g/dL — ABNORMAL LOW (ref 12.0–15.0)
MCH: 19.8 pg — ABNORMAL LOW (ref 26.0–34.0)
MCHC: 29.4 g/dL — ABNORMAL LOW (ref 30.0–36.0)
MCV: 67.4 fL — ABNORMAL LOW (ref 80.0–100.0)
Platelets: 443 10*3/uL — ABNORMAL HIGH (ref 150–400)
RBC: 4.85 MIL/uL (ref 3.87–5.11)
RDW: 15 % (ref 11.5–15.5)
WBC: 13.4 10*3/uL — ABNORMAL HIGH (ref 4.0–10.5)
nRBC: 0 % (ref 0.0–0.2)

## 2018-10-04 LAB — CREATININE, SERUM
CREATININE: 0.52 mg/dL (ref 0.44–1.00)
GFR calc Af Amer: >60 mL/min (ref >60)
GFR calc non Af Amer: >60 mL/min (ref >60)

## 2018-10-04 MED ORDER — ONDANSETRON HCL 4 MG/2ML IJ SOLN: 4.0000 mg | Freq: Four times a day (QID) | INTRAMUSCULAR | Status: DC | PRN

## 2018-10-04 MED ORDER — SODIUM CHLORIDE 0.9% FLUSH
10.0000 mL | INTRAVENOUS | Status: DC | PRN
  Administered 2018-10-05 – 2018-10-06 (×2): 10 mL
  Filled 2018-10-04 (×2): qty 40

## 2018-10-04 MED ORDER — HYDROCORTISONE 2.5 % RE CREA: 1.0000 "application " | TOPICAL_CREAM | Freq: Four times a day (QID) | RECTAL | Status: DC | PRN

## 2018-10-04 MED ORDER — HYDROMORPHONE HCL 1 MG/ML IJ SOLN
0.5000 mg | INTRAMUSCULAR | Status: DC | PRN
  Administered 2018-10-05: 1 mg via INTRAVENOUS
  Filled 2018-10-04 (×2): qty 1

## 2018-10-04 MED ORDER — HYDROCORTISONE 1 % EX CREA: 1.0000 "application " | TOPICAL_CREAM | Freq: Three times a day (TID) | CUTANEOUS | Status: DC | PRN

## 2018-10-04 MED ORDER — MORPHINE SULFATE (PF) 2 MG/ML IV SOLN
2.0000 mg | INTRAVENOUS | Status: DC | PRN
  Administered 2018-10-05: 2 mg via INTRAVENOUS
  Filled 2018-10-04: qty 1

## 2018-10-04 MED ORDER — HYDRALAZINE HCL 20 MG/ML IJ SOLN: 5.0000 mg | INTRAMUSCULAR | Status: DC | PRN

## 2018-10-04 MED ORDER — LIP MEDEX EX OINT
1.0000 "application " | TOPICAL_OINTMENT | Freq: Two times a day (BID) | CUTANEOUS | Status: DC
  Administered 2018-10-04 – 2018-10-06 (×4): 1 via TOPICAL
  Filled 2018-10-04 (×3): qty 7

## 2018-10-04 MED ORDER — LIDOCAINE-PRILOCAINE 2.5-2.5 % EX CREA: 1.0000 "application " | TOPICAL_CREAM | CUTANEOUS | Status: DC | PRN

## 2018-10-04 MED ORDER — PIPERACILLIN SOD-TAZOBACTAM SO 2.25 (2-0.25) G IV SOLR: 3.3750 g | Freq: Three times a day (TID) | INTRAVENOUS | Status: DC

## 2018-10-04 MED ORDER — PROCHLORPERAZINE EDISYLATE 10 MG/2ML IJ SOLN: 5.0000 mg | INTRAMUSCULAR | Status: DC | PRN

## 2018-10-04 MED ORDER — PIPERACILLIN-TAZOBACTAM 3.375 G IVPB 30 MIN
3.3750 g | INTRAVENOUS | Status: AC
  Administered 2018-10-04: 3.375 g via INTRAVENOUS
  Filled 2018-10-04 (×2): qty 50

## 2018-10-04 MED ORDER — SODIUM CHLORIDE 0.9 % IV SOLN: INTRAVENOUS | Status: DC

## 2018-10-04 MED ORDER — ACETAMINOPHEN 325 MG PO TABS: 650.0000 mg | ORAL_TABLET | Freq: Four times a day (QID) | ORAL | Status: DC

## 2018-10-04 MED ORDER — SODIUM CHLORIDE 0.9 % IV SOLN: 8.0000 mg | Freq: Four times a day (QID) | INTRAVENOUS | Status: DC | PRN

## 2018-10-04 MED ORDER — LACTATED RINGERS IV SOLN
INTRAVENOUS | Status: DC
  Administered 2018-10-04: 21:00:00 via INTRAVENOUS

## 2018-10-04 MED ORDER — ALUM & MAG HYDROXIDE-SIMETH 200-200-20 MG/5ML PO SUSP: 30.0000 mL | Freq: Four times a day (QID) | ORAL | Status: DC | PRN

## 2018-10-04 MED ORDER — ONDANSETRON 4 MG PO TBDP: 4.0000 mg | ORAL_TABLET | Freq: Four times a day (QID) | ORAL | Status: DC | PRN

## 2018-10-04 MED ORDER — CYCLOBENZAPRINE HCL 5 MG PO TABS: 5.0000 mg | ORAL_TABLET | Freq: Three times a day (TID) | ORAL | Status: DC | PRN

## 2018-10-04 MED ORDER — PIPERACILLIN-TAZOBACTAM 3.375 G IVPB
3.3750 g | Freq: Three times a day (TID) | INTRAVENOUS | Status: DC
  Administered 2018-10-05 – 2018-10-06 (×4): 3.375 g via INTRAVENOUS
  Filled 2018-10-04 (×4): qty 50

## 2018-10-04 MED ORDER — FERROUS SULFATE 325 (65 FE) MG PO TABS
325.0000 mg | ORAL_TABLET | Freq: Two times a day (BID) | ORAL | Status: DC
  Administered 2018-10-05 – 2018-10-06 (×3): 325 mg via ORAL
  Filled 2018-10-04 (×3): qty 1

## 2018-10-04 MED ORDER — LACTATED RINGERS IV BOLUS: 1000.0000 mL | Freq: Three times a day (TID) | INTRAVENOUS | Status: DC | PRN

## 2018-10-04 MED ORDER — DOCUSATE SODIUM 50 MG PO CAPS: 100.0000 mg | ORAL_CAPSULE | Freq: Two times a day (BID) | ORAL | Status: DC

## 2018-10-04 MED ORDER — IBUPROFEN 200 MG PO TABS
600.0000 mg | ORAL_TABLET | Freq: Every day | ORAL | Status: DC
  Administered 2018-10-04: 600 mg via ORAL
  Filled 2018-10-04: qty 3

## 2018-10-04 MED ORDER — MAGIC MOUTHWASH: 15.0000 mL | Freq: Four times a day (QID) | ORAL | Status: DC | PRN

## 2018-10-04 MED ORDER — SODIUM CHLORIDE 0.9 % IV SOLN: 4.0000 mg | Freq: Four times a day (QID) | INTRAVENOUS | Status: DC | PRN

## 2018-10-04 MED ORDER — PHENOL 1.4 % MT LIQD: 1.0000 | OROMUCOSAL | Status: DC | PRN

## 2018-10-04 MED ORDER — OXYCODONE HCL 5 MG PO TABS: 5.0000 mg | ORAL_TABLET | ORAL | Status: DC | PRN

## 2018-10-04 MED ORDER — MENTHOL 3 MG MT LOZG: 1.0000 | LOZENGE | OROMUCOSAL | Status: DC | PRN

## 2018-10-04 MED ORDER — METOPROLOL TARTRATE 5 MG/5ML IV SOLN: 5.0000 mg | Freq: Four times a day (QID) | INTRAVENOUS | Status: DC | PRN

## 2018-10-04 MED ORDER — MORPHINE SULFATE (PF) 4 MG/ML IV SOLN: 1.0000 mg | INTRAVENOUS | Status: DC | PRN

## 2018-10-04 MED ORDER — ALPRAZOLAM 0.5 MG PO TABS
0.5000 mg | ORAL_TABLET | Freq: Two times a day (BID) | ORAL | Status: DC | PRN
  Administered 2018-10-05: 0.5 mg via ORAL
  Filled 2018-10-04: qty 1

## 2018-10-04 MED ORDER — DIPHENOXYLATE-ATROPINE 2.5-0.025 MG PO TABS: 2.0000 | ORAL_TABLET | Freq: Four times a day (QID) | ORAL | Status: DC | PRN

## 2018-10-04 MED ORDER — POLYETHYLENE GLYCOL 3350 17 G PO PACK: 17.0000 g | PACK | Freq: Every day | ORAL | Status: DC | PRN

## 2018-10-04 MED ORDER — OXYCODONE HCL 5 MG PO TABS
5.0000 mg | ORAL_TABLET | ORAL | Status: DC | PRN
  Administered 2018-10-05 – 2018-10-06 (×4): 5 mg via ORAL
  Filled 2018-10-04 (×4): qty 1

## 2018-10-04 MED ORDER — METHOCARBAMOL 1000 MG/10ML IJ SOLN: 1000.0000 mg | Freq: Four times a day (QID) | INTRAVENOUS | Status: DC | PRN

## 2018-10-04 MED ORDER — DIPHENHYDRAMINE HCL 50 MG/ML IJ SOLN: 12.5000 mg | Freq: Four times a day (QID) | INTRAMUSCULAR | Status: DC | PRN

## 2018-10-04 MED ORDER — ADULT MULTIVITAMIN W/MINERALS CH
1.0000 | ORAL_TABLET | Freq: Every day | ORAL | Status: DC
  Administered 2018-10-04 – 2018-10-06 (×3): 1 via ORAL
  Filled 2018-10-04 (×3): qty 1

## 2018-10-04 MED ORDER — GABAPENTIN 300 MG PO CAPS
300.0000 mg | ORAL_CAPSULE | Freq: Three times a day (TID) | ORAL | Status: DC
  Administered 2018-10-04: 300 mg via ORAL
  Filled 2018-10-04: qty 1

## 2018-10-04 MED ORDER — PANTOPRAZOLE SODIUM 40 MG PO TBEC
40.0000 mg | DELAYED_RELEASE_TABLET | Freq: Every day | ORAL | Status: DC
  Administered 2018-10-04 – 2018-10-06 (×3): 40 mg via ORAL
  Filled 2018-10-04 (×3): qty 1

## 2018-10-04 MED ORDER — IOPAMIDOL (ISOVUE-300) INJECTION 61%
100.0000 mL | Freq: Once | INTRAVENOUS | Status: AC | PRN
  Administered 2018-10-04: 100 mL via INTRAVENOUS

## 2018-10-04 MED ORDER — ENOXAPARIN SODIUM 150 MG/ML ~~LOC~~ SOLN: 40.0000 mg | SUBCUTANEOUS | Status: DC

## 2018-10-04 MED ORDER — POLYETHYLENE GLYCOL 3350 17 G PO PACK: 17.0000 g | PACK | Freq: Two times a day (BID) | ORAL | Status: DC | PRN

## 2018-10-04 MED ORDER — DIPHENHYDRAMINE HCL 50 MG/ML IJ SOLN: 25.0000 mg | Freq: Four times a day (QID) | INTRAMUSCULAR | Status: DC | PRN

## 2018-10-04 MED ORDER — ENOXAPARIN SODIUM 40 MG/0.4ML ~~LOC~~ SOLN
40.0000 mg | SUBCUTANEOUS | Status: DC
  Administered 2018-10-04 – 2018-10-05 (×2): 40 mg via SUBCUTANEOUS
  Filled 2018-10-04 (×2): qty 0.4

## 2018-10-04 MED ORDER — TRAMADOL HCL 50 MG PO TABS: 50.0000 mg | ORAL_TABLET | Freq: Four times a day (QID) | ORAL | Status: DC | PRN

## 2018-10-04 MED ORDER — ACETAMINOPHEN 650 MG RE SUPP: 650.0000 mg | Freq: Four times a day (QID) | RECTAL | Status: DC | PRN

## 2018-10-04 MED ORDER — ACETAMINOPHEN 325 MG PO TABS
650.0000 mg | ORAL_TABLET | Freq: Four times a day (QID) | ORAL | Status: DC | PRN
  Administered 2018-10-05: 650 mg via ORAL
  Filled 2018-10-04: qty 2

## 2018-10-04 MED ORDER — DIPHENHYDRAMINE HCL 25 MG PO CAPS: 25.0000 mg | ORAL_CAPSULE | Freq: Four times a day (QID) | ORAL | Status: DC | PRN

## 2018-10-04 MED ORDER — DEXTROSE-NACL 5-0.45 % IV SOLN: INTRAVENOUS | Status: DC

## 2018-10-04 MED ORDER — GUAIFENESIN-DM 100-10 MG/5ML PO SYRP: 10.0000 mL | ORAL_SOLUTION | ORAL | Status: DC | PRN

## 2018-10-04 NOTE — Progress Notes (Signed)
Postop Follow-up Note: Gyn-Onc  Consult was requested by Dr. Dema Severin for the evaluation of Tina Morales 53 y.o. female  CC:  Chief Complaint  Patient presents with  . Rectal cancer [facility])    Assessment/Plan:  [name]  is a 53 y.o.  year old with a history ofstage IV and locally advanced rectal cancer with posterior vaginal wall involvement who is s/p APR (robotic) and radical lower vaginectomy on 09/01/18.  Postop perineal incision dehiscence and cellulitis.  Discussed with Dr Dema Severin who recommended admission to [facility] for IV antibiotics and wound care (possible wet to dry vs wound vac).   We will consider vaginal premarin to assist with healing once pain and infection has subsided. I'll see her back in am month Recommend use of vaginal dilator when tissues have adequately healed.  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   HPI: [name] is a 53 year old P0 who was seen in consultation at the request of Dr Dema Severin for locally advanced rectal cancer with vaginal involvement.  The patient began experiencing some discomfort with intercourse and GI discomfort in August 2019.  She underwent a pelvic examination with a transvaginal ultrasound scan and this was unremarkable, therefore was referred for colonoscopy.  Colonoscopy on 02/10/2018 revealed a circumferential mass in the rectum with biopsy confirming a tubular adenoma with at least high-grade dysplasia.  Review of the specimen with a digestive health specialist confirmed intramucosal adenocarcinoma.  CT scan on 02/10/2018 revealed anterior rectal wall thickening, pulmonary nodules measuring up to 9 mm concerning for metastases, and a few round perirectal lymph nodes.  Pelvic MRI on 02/20/2018 revealed hypermetabolic low rectal mass extending from the posterior vagina with 2 small and large perirectal lymph nodes.  A PET scan on 02/23/2018 confirmed and hypermetabolic rectal mass, small  scattered bilateral lung nodules, a few with mildly increased activity.  She underwent chemotherapy with 6 cycles of FOLFOXIRI between 03/01/2018 and 05/11/2018.  She received radiation with Xeloda until 07/21/18.  Based on repeat imaging she had good response to therapy and was recommended to be considered for APR with Dr Annye English on 09/01/18.  The patient is otherwise very healthy.  She has never been pregnant.  She is postmenopausal prior to radiation.  She is never had an abnormal Pap smear.  Interval Hx:  On 09/01/18 she underwent robotic assisted abdomino-perineal resection with radical distal posterior vaginectomy. The surgery was uncomplicated. Margins on the specimen were negative. The vagina was retubularized/ reconstructed intraop but significantly narrowed.   Since surgery she has had significant perineal pain. It has become progressively worse.  Sutures were removed in the past week and there has been wound separation posteriorally with drainage of purulent fluid and pain. She has had low grade fevers.  CT abd/pelvis on 10/04/18 showed: Track of gas identified at the anorectal bed post APR, extending to nearly the bladder base RIGHT posterolateral, without defined abscess collection, question nonhealed versus open wound. Mild stranding in the presacral space favor related to prior surgery and radiation therapy.  CT chest on 09/30/18 showed: progressive metastatic disease to the lungs with increased size of previously noted pulmonary nodules.  Current Meds:  Outpatient Encounter Medications as of 10/04/2018  Medication Sig  . acetaminophen (TYLENOL) 500 MG tablet Take 1,000 mg by mouth every 8 (eight) hours as needed (PAIN).   Marland Kitchen ALPRAZolam (XANAX) 0.5 MG tablet Take 1 tablet (0.5 mg total) by mouth 2 (  two) times daily as needed for anxiety.  . cyclobenzaprine (FLEXERIL) 5 MG tablet Take 1 tablet (5 mg total) by mouth 3 (three) times daily as needed for muscle spasms.  .  diphenoxylate-atropine (LOMOTIL) 2.5-0.025 MG tablet Take 2 tablets by mouth 4 (four) times daily as needed for diarrhea or loose stools.  . ferrous sulfate 325 (65 FE) MG EC tablet Take 1 tablet (325 mg total) by mouth 2 (two) times daily with a meal.  . gabapentin (NEURONTIN) 300 MG capsule Take 1 capsule (300 mg total) by mouth 3 (three) times daily.  . Ibuprofen 200 MG CAPS Take 600 mg by mouth daily.  Marland Kitchen lidocaine-prilocaine (EMLA) cream Apply 1 application topically as needed.  . Multiple Vitamins-Minerals (MULTIVITAMIN WITH MINERALS) tablet Take 1 tablet by mouth daily.  Marland Kitchen omeprazole (PRILOSEC) 20 MG capsule Take 20 mg by mouth daily.  . ondansetron (ZOFRAN) 8 MG tablet Take 1 tablet (8 mg total) by mouth every 12 (twelve) hours as needed for nausea or vomiting.  Marland Kitchen oxyCODONE (OXY IR/ROXICODONE) 5 MG immediate release tablet Take 5 mg by mouth every 6 (six) hours as needed. for pain  . Polyethylene Glycol 3350 (MIRALAX PO) Take 17 g by mouth daily as needed.  . prochlorperazine (COMPAZINE) 10 MG tablet Take 1 tablet (10 mg total) by mouth every 6 (six) hours as needed for nausea or vomiting.  . traMADol (ULTRAM) 50 MG tablet Take 1 tablet (50 mg total) by mouth every 6 (six) hours as needed.   No facility-administered encounter medications on file as of 10/04/2018.     Allergy:  Allergies  Allergen Reactions  . Sulfa Antibiotics     Patient unaware of side effects, she was told when she was a child that she was allergic    Social Hx:   Social History   Socioeconomic History  . Marital status: Married    Spouse name: Not on file  . Number of children: Not on file  . Years of education: Not on file  . Highest education level: Not on file  Occupational History  . Not on file  Social Needs  . Financial resource strain: Not on file  . Food insecurity:    Worry: Not on file    Inability: Not on file  . Transportation needs:    Medical: No    Non-medical: No  Tobacco Use  .  Smoking status: Former Smoker    Years: 30.00    Last attempt to quit: 02/2018    Years since quitting: 0.6  . Smokeless tobacco: Never Used  Substance and Sexual Activity  . Alcohol use: Not Currently    Frequency: Never    Comment: per Dr Dema Severin chart, "no drink in the last 5 years"  . Drug use: Never  . Sexual activity: Not on file  Lifestyle  . Physical activity:    Days per week: Not on file    Minutes per session: Not on file  . Stress: Not on file  Relationships  . Social connections:    Talks on phone: Not on file    Gets together: Not on file    Attends religious service: Not on file    Active member of club or organization: Not on file    Attends meetings of clubs or organizations: Not on file    Relationship status: Not on file  . Intimate partner violence:    Fear of current or ex partner: Not on file    Emotionally abused:  Not on file    Physically abused: Not on file    Forced sexual activity: Not on file  Other Topics Concern  . Not on file  Social History Narrative  . Not on file    Past Surgical Hx:  Past Surgical History:  Procedure Laterality Date  . ADENOIDECTOMY    . CYSTOSCOPY W/ RETROGRADES Bilateral 09/01/2018   Procedure: CYSTOSCOPY WITH BILATERAL  RETROGRADE PYELOGRAM;  Surgeon: Alexis Frock, MD;  Location: [facility];  Service: Urology;  Laterality: Bilateral;  . CYSTOSCOPY WITH STENT PLACEMENT Bilateral 09/01/2018   Procedure: CYSTOSCOPY WITH URETERAL COOK CATHETER PLACEMENT;  Surgeon: Alexis Frock, MD;  Location: [facility];  Service: Urology;  Laterality: Bilateral;  . ROBOTIC ASSISTED TOTAL HYSTERECTOMY WITH BILATERAL SALPINGO OOPHERECTOMY Bilateral 09/01/2018   Procedure: XI ROBOTIC ASSISTED PARTIAL VAGINECTOMY;  Surgeon: Everitt Amber, MD;  Location: [facility];  Service: Gynecology;  Laterality: Bilateral;  . TONSILLECTOMY      Past Medical Hx:  Past Medical History:  Diagnosis Date  . Adenocarcinoma ([place]) 02/2018  . Anxiety   . Cancer ([place])  02/10/2018   Stage 4 Colorectal Cancer, spot on her lungs  . Migraine     Past Gynecological History:  See HPI No LMP recorded. Patient is postmenopausal.  Family Hx:  Family History  Problem Relation Age of Onset  . Breast cancer Maternal Aunt   . Throat cancer Cousin   . Cancer Cousin   . Throat cancer Cousin   . Lung cancer Paternal Uncle     Review of Systems:  Constitutional  Feels well,    ENT Normal appearing ears and nares bilaterally Skin/Breast  No rash, sores, jaundice, itching, dryness Cardiovascular  No chest pain, shortness of breath, or edema  Pulmonary  No cough or wheeze.  Gastro Intestinal  No nausea, vomitting, or diarrhoea. No bright red blood per rectum, no abdominal pain, change in bowel movement, or constipation.  Genito Urinary  No frequency, urgency, dysuria, no bleeding Musculo Skeletal  No myalgia, arthralgia, joint swelling or pain  Neurologic  No weakness, numbness, change in gait,  Psychology  No depression, anxiety, insomnia.   Vitals:  Blood pressure 104/74, pulse (!) 117, temperature 98.5 F (36.9 C), temperature source Oral, resp. rate 16, height 5\' 6"  (1.676 m), weight 132 lb (59.9 kg), SpO2 100 %.  Physical Exam: WD in NAD Neck  Supple NROM, without any enlargements.  Lymph Node Survey No cervical supraclavicular or inguinal adenopathy Cardiovascular  Pulse normal rate, regularity and rhythm. S1 and S2 normal.  Lungs  Clear to auscultation bilateraly, without wheezes/crackles/rhonchi. Good air movement.  Skin  No rash/lesions/breakdown  Psychiatry  Alert and oriented to person, place, and time  Abdomen  Normoactive bowel sounds, abdomen soft, non-tender and thin without evidence of hernia. Incisions well healed. Stoma functioning Back No CVA tenderness Genito Urinary  anteriorally there is mild separation of the mucosa but no significant defect.  Perineal body is firm but not fluctuant and mildly tender. At the  posterior aspect of the perineal incision is a 3cm defect in the incision which is draining purulent fluid. On digital exam, this permit a finger into a cavernous space for at least 6cm. No evisceration through the perineum.  Rectal : NA Extremities  No bilateral cyanosis, clubbing or edema.     Thereasa Solo, MD  10/04/2018, 1:34 PM

## 2018-10-04 NOTE — H&P (Signed)
H&P  Chief Complaint:  Surgical (perineal) incision separation and cellulitis.   Primary Admitting Physician:  Dr Annye English  Assessment/Plan:  Ms. Syriana Croslin  is a 53 y.o.  year old with a history of stage IV and rectal cancer who is s/p APR (robotic) on 09/01/18. Postop perineal incision dehiscence and cellulitis.  Discussed with Dr Dema Severin who recommended admission to [facility] for IV antibiotics and wound care (possible wet to dry vs wound vac).  No clinical signs of sepsis.   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   HPI: Ms Boneta Standre is a 53 year old P0 with a history of advanced rectal cancer with vaginal involvement.  The patient began experiencing some discomfort with intercourse and GI discomfort in August 2019.  She underwent a pelvic examination with a transvaginal ultrasound scan and this was unremarkable, therefore was referred for colonoscopy.  Colonoscopy on 02/10/2018 revealed a circumferential mass in the rectum with biopsy confirming a tubular adenoma with at least high-grade dysplasia.  Review of the specimen with a digestive health specialist confirmed intramucosal adenocarcinoma.  CT scan on 02/10/2018 revealed anterior rectal wall thickening, pulmonary nodules measuring up to 9 mm concerning for metastases, and a few round perirectal lymph nodes.  Pelvic MRI on 02/20/2018 revealed hypermetabolic low rectal mass extending from the posterior vagina with 2 small and large perirectal lymph nodes.  A PET scan on 02/23/2018 confirmed and hypermetabolic rectal mass, small scattered bilateral lung nodules, a few with mildly increased activity.  She underwent chemotherapy with 6 cycles of FOLFOXIRI between 03/01/2018 and 05/11/2018.  She received pelvic radiation with Xeloda until 07/21/18.  Based on repeat imaging she had good response to therapy and was recommended to be considered for APR with Dr Annye English on 09/01/18.  On 09/01/18  she underwent robotic assisted abdomino-perineal resection with radical distal posterior vaginectomy. The surgery was uncomplicated. Margins on the specimen were negative.  Interval Hx:  Since surgery she has had significant perineal pain. Sutures were removed on perineum on 09/26/18. It has become progressively worse.  Sutures were removed in the past week and there has been wound separation posteriorally with drainage of purulent fluid and pain. She has had low grade fevers. She was started on Augmentin on 10/03/18.   CT abd/pelvis on 10/04/18 showed: Track of gas identified at the anorectal bed post APR, extending to nearly the bladder base RIGHT posterolateral, without defined abscess collection, question nonhealed versus open wound. Mild stranding in the presacral space favor related to prior surgery and radiation therapy.  CT chest on 09/30/18 showed: progressive metastatic disease to the lungs with increased size of previously noted pulmonary nodules.  Current Meds:  Outpatient Encounter Medications as of 10/04/2018  Medication Sig  . acetaminophen (TYLENOL) 500 MG tablet Take 1,000 mg by mouth every 8 (eight) hours as needed (PAIN).   Marland Kitchen ALPRAZolam (XANAX) 0.5 MG tablet Take 1 tablet (0.5 mg total) by mouth 2 (two) times daily as needed for anxiety.  . cyclobenzaprine (FLEXERIL) 5 MG tablet Take 1 tablet (5 mg total) by mouth 3 (three) times daily as needed for muscle spasms.  . diphenoxylate-atropine (LOMOTIL) 2.5-0.025 MG tablet Take 2 tablets by mouth 4 (four) times daily as needed for diarrhea or loose stools.  . ferrous sulfate 325 (65 FE) MG EC tablet Take 1 tablet (325 mg total) by mouth 2 (two) times daily with a meal.  . gabapentin (NEURONTIN) 300 MG capsule  Take 1 capsule (300 mg total) by mouth 3 (three) times daily.  . Ibuprofen 200 MG CAPS Take 600 mg by mouth daily.  Marland Kitchen lidocaine-prilocaine (EMLA) cream Apply 1 application topically as needed.  . Multiple Vitamins-Minerals  (MULTIVITAMIN WITH MINERALS) tablet Take 1 tablet by mouth daily.  Marland Kitchen omeprazole (PRILOSEC) 20 MG capsule Take 20 mg by mouth daily.  . ondansetron (ZOFRAN) 8 MG tablet Take 1 tablet (8 mg total) by mouth every 12 (twelve) hours as needed for nausea or vomiting.  Marland Kitchen oxyCODONE (OXY IR/ROXICODONE) 5 MG immediate release tablet Take 5 mg by mouth every 6 (six) hours as needed. for pain  . Polyethylene Glycol 3350 (MIRALAX PO) Take 17 g by mouth daily as needed.  . prochlorperazine (COMPAZINE) 10 MG tablet Take 1 tablet (10 mg total) by mouth every 6 (six) hours as needed for nausea or vomiting.  . traMADol (ULTRAM) 50 MG tablet Take 1 tablet (50 mg total) by mouth every 6 (six) hours as needed.   No facility-administered encounter medications on file as of 10/04/2018.     Allergy:  Allergies  Allergen Reactions  . Sulfa Antibiotics     Patient unaware of side effects, she was told when she was a child that she was allergic    Social Hx:   Social History   Socioeconomic History  . Marital status: Married    Spouse name: Not on file  . Number of children: Not on file  . Years of education: Not on file  . Highest education level: Not on file  Occupational History  . Not on file  Social Needs  . Financial resource strain: Not on file  . Food insecurity:    Worry: Not on file    Inability: Not on file  . Transportation needs:    Medical: No    Non-medical: No  Tobacco Use  . Smoking status: Former Smoker    Years: 30.00    Last attempt to quit: 02/2018    Years since quitting: 0.6  . Smokeless tobacco: Never Used  Substance and Sexual Activity  . Alcohol use: Not Currently    Frequency: Never    Comment: per Dr Dema Severin chart, "no drink in the last 5 years"  . Drug use: Never  . Sexual activity: Not on file  Lifestyle  . Physical activity:    Days per week: Not on file    Minutes per session: Not on file  . Stress: Not on file  Relationships  . Social connections:    Talks  on phone: Not on file    Gets together: Not on file    Attends religious service: Not on file    Active member of club or organization: Not on file    Attends meetings of clubs or organizations: Not on file    Relationship status: Not on file  . Intimate partner violence:    Fear of current or ex partner: Not on file    Emotionally abused: Not on file    Physically abused: Not on file    Forced sexual activity: Not on file  Other Topics Concern  . Not on file  Social History Narrative  . Not on file    Past Surgical Hx:  Past Surgical History:  Procedure Laterality Date  . ADENOIDECTOMY    . CYSTOSCOPY W/ RETROGRADES Bilateral 09/01/2018   Procedure: CYSTOSCOPY WITH BILATERAL  RETROGRADE PYELOGRAM;  Surgeon: Alexis Frock, MD;  Location: [facility];  Service: Urology;  Laterality: Bilateral;  . CYSTOSCOPY WITH STENT PLACEMENT Bilateral 09/01/2018   Procedure: CYSTOSCOPY WITH URETERAL COOK CATHETER PLACEMENT;  Surgeon: Alexis Frock, MD;  Location: [facility];  Service: Urology;  Laterality: Bilateral;  . ROBOTIC ASSISTED TOTAL HYSTERECTOMY WITH BILATERAL SALPINGO OOPHERECTOMY Bilateral 09/01/2018   Procedure: XI ROBOTIC ASSISTED PARTIAL VAGINECTOMY;  Surgeon: Everitt Amber, MD;  Location: [facility];  Service: Gynecology;  Laterality: Bilateral;  . TONSILLECTOMY      Past Medical Hx:  Past Medical History:  Diagnosis Date  . Adenocarcinoma ([place]) 02/2018  . Anxiety   . Cancer ([place]) 02/10/2018   Stage 4 Colorectal Cancer, spot on her lungs  . Migraine     Past Gynecological History:  See HPI No LMP recorded. Patient is postmenopausal.  Family Hx:  Family History  Problem Relation Age of Onset  . Breast cancer Maternal Aunt   . Throat cancer Cousin   . Cancer Cousin   . Throat cancer Cousin   . Lung cancer Paternal Uncle     Review of Systems:  Constitutional  Feels fatigued from pain  ENT Normal appearing ears and nares bilaterally Skin/Breast  No rash, sores, jaundice,  itching, dryness Cardiovascular  No chest pain, shortness of breath, or edema  Pulmonary  No cough or wheeze.  Gastro Intestinal  No nausea, vomitting, or diarrhoea. No bright red blood per rectum, no abdominal pain. Genito Urinary  No frequency, urgency, dysuria, no bleeding Musculo Skeletal  No myalgia, arthralgia, joint swelling or pain  Neurologic  No weakness, numbness, change in gait,  Psychology  No depression, anxiety, insomnia.   Vitals:  There were no vitals taken for this visit.  Physical Exam: WD in NAD Neck  Supple NROM, without any enlargements.  Lymph Node Survey No cervical supraclavicular or inguinal adenopathy Cardiovascular  Pulse normal rate, regularity and rhythm. S1 and S2 normal.  Lungs  Clear to auscultation bilateraly, without wheezes/crackles/rhonchi. Good air movement.  Skin  No rash/lesions/breakdown  Psychiatry  Alert and oriented to person, place, and time  Abdomen  Normoactive bowel sounds, abdomen soft, non-tender and thin without evidence of hernia. Incisions well healed. Stoma functioning Back No CVA tenderness Genito Urinary  anteriorally there is mild separation of the mucosa but no significant defect.  Perineal body is firm but not fluctuant and mildly tender. At the posterior aspect of the perineal incision is a 3cm defect in the incision which is draining purulent fluid. On digital exam, this permit a finger into a cavernous space for at least 5cm. No evisceration through the perineum. No cellulitis on surround skin edges.   Rectal : NA Extremities  No bilateral cyanosis, clubbing or edema.  Thereasa Solo, MD  10/04/2018, 5:12 PM

## 2018-10-04 NOTE — Progress Notes (Signed)
Entering admissions orders

## 2018-10-04 NOTE — Progress Notes (Signed)
Pharmacy Antibiotic Note  Tina Morales is a 53 y.o. female admitted on 10/04/2018 with perineal incision dehiscence and cellulitis. Pharmacy has been consulted for Zosyn dosing.  Plan:  Zosyn 3.375g IV x 1 over 30 minutes, then Zosyn 3.375g IV q8h (each dose infused over 4 hours).  Do not anticipate dose adjustment will be needed, so pharmacy will sign off at this time. Please re-consult as needed.   Height: 5\' 7"  (170.2 cm) Weight: 134 lb 4.2 oz (60.9 kg) IBW/kg (Calculated) : 61.6  Temp (24hrs), Avg:98.3 F (36.8 C), Min:98 F (36.7 C), Max:98.5 F (36.9 C)  Recent Labs  Lab 10/04/18 1820  WBC 13.4*  CREATININE 0.52    Estimated Creatinine Clearance: 80.9 mL/min (by C-G formula based on SCr of 0.52 mg/dL).    Allergies  Allergen Reactions  . Sulfa Antibiotics     Patient unaware of side effects, she was told when she was a child that she was allergic    Thank you for allowing pharmacy to be a part of this patient's care.  Lindell Spar, PharmD, BCPS Pager: [phone] 10/04/2018 7:57 PM

## 2018-10-04 NOTE — Progress Notes (Signed)
Patient being admitted for wound dehiscence.  Tina Morales , [facility] 10/04/2018, 3:44 PM Pager: [phone]

## 2018-10-04 NOTE — Patient Instructions (Addendum)
  Dr Denman George is recommending admission to hospital. She will see you again for follow-up in 1 month.

## 2018-10-04 NOTE — Progress Notes (Signed)
Orders placed for admission. Full H&P to come in chart.

## 2018-10-05 ENCOUNTER — Telehealth: Payer: Self-pay | Admitting: Radiation Oncology

## 2018-10-05 ENCOUNTER — Encounter (HOSPITAL_COMMUNITY): Payer: Self-pay

## 2018-10-05 DIAGNOSIS — Z87891 Personal history of nicotine dependence: Secondary | ICD-10-CM

## 2018-10-05 DIAGNOSIS — D509 Iron deficiency anemia, unspecified: Secondary | ICD-10-CM

## 2018-10-05 DIAGNOSIS — R918 Other nonspecific abnormal finding of lung field: Secondary | ICD-10-CM

## 2018-10-05 DIAGNOSIS — T8131XA Disruption of external operation (surgical) wound, not elsewhere classified, initial encounter: Secondary | ICD-10-CM

## 2018-10-05 DIAGNOSIS — C799 Secondary malignant neoplasm of unspecified site: Secondary | ICD-10-CM

## 2018-10-05 DIAGNOSIS — C20 Malignant neoplasm of rectum: Secondary | ICD-10-CM

## 2018-10-05 DIAGNOSIS — D569 Thalassemia, unspecified: Secondary | ICD-10-CM

## 2018-10-05 DIAGNOSIS — Y833 Surgical operation with formation of external stoma as the cause of abnormal reaction of the patient, or of later complication, without mention of misadventure at the time of the procedure: Secondary | ICD-10-CM

## 2018-10-05 LAB — COMPREHENSIVE METABOLIC PANEL
ALT: 6 U/L (ref 0–44)
AST: 11 U/L — ABNORMAL LOW (ref 15–41)
Albumin: 2.4 g/dL — ABNORMAL LOW (ref 3.5–5.0)
Alkaline Phosphatase: 122 U/L (ref 38–126)
Anion gap: 7 (ref 5–15)
BUN: <5 mg/dL — ABNORMAL LOW (ref 6–20)
CHLORIDE: 103 mmol/L (ref 98–111)
CO2: 28 mmol/L (ref 22–32)
Calcium: 7.9 mg/dL — ABNORMAL LOW (ref 8.9–10.3)
Creatinine, Ser: 0.36 mg/dL — ABNORMAL LOW (ref 0.44–1.00)
GFR calc Af Amer: >60 mL/min (ref >60)
GFR calc non Af Amer: >60 mL/min (ref >60)
Glucose, Bld: 105 mg/dL — ABNORMAL HIGH (ref 70–99)
Potassium: 2.8 mmol/L — ABNORMAL LOW (ref 3.5–5.1)
Sodium: 138 mmol/L (ref 135–145)
TOTAL PROTEIN: 5.5 g/dL — AB (ref 6.5–8.1)
Total Bilirubin: 0.4 mg/dL (ref 0.3–1.2)

## 2018-10-05 LAB — CBC
HCT: 23.8 % — ABNORMAL LOW (ref 36.0–46.0)
Hemoglobin: 7.3 g/dL — ABNORMAL LOW (ref 12.0–15.0)
MCH: 20.2 pg — ABNORMAL LOW (ref 26.0–34.0)
MCHC: 30.7 g/dL (ref 30.0–36.0)
MCV: 65.7 fL — ABNORMAL LOW (ref 80.0–100.0)
PLATELETS: 411 10*3/uL — AB (ref 150–400)
RBC: 3.62 MIL/uL — ABNORMAL LOW (ref 3.87–5.11)
RDW: 14.9 % (ref 11.5–15.5)
WBC: 10.4 10*3/uL (ref 4.0–10.5)
nRBC: 0 % (ref 0.0–0.2)

## 2018-10-05 MED ORDER — IBUPROFEN 200 MG PO TABS: 600.0000 mg | ORAL_TABLET | Freq: Four times a day (QID) | ORAL | Status: DC | PRN

## 2018-10-05 MED ORDER — HYDROMORPHONE HCL 1 MG/ML IJ SOLN
0.5000 mg | INTRAMUSCULAR | Status: DC | PRN
  Administered 2018-10-05 – 2018-10-06 (×5): 0.5 mg via INTRAVENOUS
  Filled 2018-10-05 (×4): qty 0.5

## 2018-10-05 MED ORDER — GABAPENTIN 300 MG PO CAPS
600.0000 mg | ORAL_CAPSULE | Freq: Three times a day (TID) | ORAL | Status: DC
  Administered 2018-10-05 – 2018-10-06 (×4): 600 mg via ORAL
  Filled 2018-10-05 (×4): qty 2

## 2018-10-05 MED ORDER — IBUPROFEN 200 MG PO TABS
600.0000 mg | ORAL_TABLET | Freq: Four times a day (QID) | ORAL | Status: DC
  Administered 2018-10-05 – 2018-10-06 (×3): 600 mg via ORAL
  Filled 2018-10-05 (×3): qty 3

## 2018-10-05 NOTE — Progress Notes (Addendum)
HEMATOLOGY-ONCOLOGY PROGRESS NOTE  SUBJECTIVE: Has ongoing soreness around her open wound.  Low-grade fever earlier today and Tylenol was given.  No nausea, vomiting.  Colostomy is working well.    Rectal cancer ([place])   04/27/2018 Initial Diagnosis    Rectal cancer ([place])    05/11/2018 -  Chemotherapy    The patient had palonosetron (ALOXI) injection 0.25 mg, 0.25 mg, Intravenous,  Once, 1 of 6 cycles Administration: 0.25 mg (05/11/2018) irinotecan (CAMPTOSAR) 220 mg in dextrose 5 % 500 mL chemo infusion, 150 mg/m2 = 220 mg (100 % of original dose 150 mg/m2), Intravenous,  Once, 1 of 6 cycles Dose modification: 150 mg/m2 (original dose 150 mg/m2, Cycle 1, Reason: Provider Judgment) Administration: 220 mg (05/11/2018) leucovorin 306 mg in dextrose 5 % 250 mL infusion, 200 mg/m2 = 306 mg, Intravenous,  Once, 1 of 6 cycles Administration: 306 mg (05/11/2018) oxaliplatin (ELOXATIN) 130 mg in dextrose 5 % 500 mL chemo infusion, 85 mg/m2 = 130 mg, Intravenous,  Once, 1 of 6 cycles Administration: 130 mg (05/11/2018) fosaprepitant (EMEND) 150 mg, dexamethasone (DECADRON) 12 mg in sodium chloride 0.9 % 145 mL IVPB, , Intravenous,  Once, 1 of 6 cycles Administration:  (05/11/2018) fluorouracil (ADRUCIL) 5,000 mg in sodium chloride 0.9 % 150 mL chemo infusion, 3,260 mg/m2 = 4,900 mg, Intravenous, 1 Day/Dose, 1 of 6 cycles Administration: 5,000 mg (05/11/2018)  for chemotherapy treatment.      REVIEW OF SYSTEMS:   Constitutional: Has had a low-grade fever.  No chills. Eyes: Denies blurriness of vision Ears, nose, mouth, throat, and face: Denies mucositis or sore throat Respiratory: Denies cough, dyspnea or wheezes Cardiovascular: Denies palpitation, chest discomfort Gastrointestinal:  Denies nausea, heartburn or change in bowel habits Skin: Has open perineal wound. Lymphatics: Denies new lymphadenopathy or easy bruising Neurological:Denies numbness, tingling or new  weaknesses Behavioral/Psych: Mood is stable, no new changes  Extremities: No lower extremity edema All other systems were reviewed with the patient and are negative.  I have reviewed the past medical history, past surgical history, social history and family history with the patient and they are unchanged from previous note.   PHYSICAL EXAMINATION:  Vitals:   10/05/18 0524 10/05/18 1002  BP: 102/66 125/79  Pulse: 98 100  Resp: 16 18  Temp: 98.9 F (37.2 C) (!) 100.6 F (38.1 C)  SpO2: 97% 98%   Filed Weights   10/04/18 1809  Weight: 134 lb 4.2 oz (60.9 kg)    GENERAL:alert, no distress and comfortable SKIN: skin color, texture, turgor are normal, no rashes or significant lesions EYES: normal, Conjunctiva are pink and non-injected, sclera clear OROPHARYNX:no exudate, no erythema and lips, buccal mucosa, and tongue normal  NECK: supple, thyroid normal size, non-tender, without nodularity LYMPH:  no palpable lymphadenopathy in the cervical, axillary or inguinal LUNGS: clear to auscultation and percussion with normal breathing effort HEART: regular rate & rhythm and no murmurs and no lower extremity edema ABDOMEN:abdomen soft, non-tender and normal bowel sounds Musculoskeletal:no cyanosis of digits and no clubbing  NEURO: alert & oriented x 3 with fluent speech, no focal motor/sensory deficits  LABORATORY DATA:  I have reviewed the data as listed CMP Latest Ref Rng & Units 10/05/2018 10/04/2018 09/04/2018  Glucose 70 - 99 mg/dL 105(H) - 101(H)  BUN 6 - 20 mg/dL <5(L) - 10  Creatinine 0.44 - 1.00 mg/dL 0.36(L) 0.52 0.55  Sodium 135 - 145 mmol/L 138 - 135  Potassium 3.5 - 5.1 mmol/L 2.8(L) - 3.7  Chloride 98 -  111 mmol/L 103 - 101  CO2 22 - 32 mmol/L 28 - 27  Calcium 8.9 - 10.3 mg/dL 7.9(L) - 8.1(L)  Total Protein 6.5 - 8.1 g/dL 5.5(L) - -  Total Bilirubin 0.3 - 1.2 mg/dL 0.4 - -  Alkaline Phos 38 - 126 U/L 122 - -  AST 15 - 41 U/L 11(L) - -  ALT 0 - 44 U/L 6 - -    Lab  Results  Component Value Date   WBC 10.4 10/05/2018   HGB 7.3 (L) 10/05/2018   HCT 23.8 (L) 10/05/2018   MCV 65.7 (L) 10/05/2018   PLT 411 (H) 10/05/2018   NEUTROABS 7.9 (H) 08/30/2018    ASSESSMENT AND PLAN: 1. Rectal cancer-rectal mass noted on digital exam 02/10/2018, colonoscopy confirmed a him my circumferential mass in the rectum ? 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 CTs 02/10/2018-anterior rectal wall thickening, pulmonary nodules measuring up to 9 mm concerning for metastases, few round perirectal lymph nodes ? Pelvic MRI 02/20/2018-hypermetabolic low rectal mass extending to the posterior vagina with 2 small enlarged perirectal lymph nodes, T4b,N1-2.3 cm from the anal verge ? PET scan 02/23/2018-hypermetabolic rectal mass, 8 mm hypermetabolic lingular nodule, scattered small bilateral lung nodules, some calcified, a few with mildly increased activity ? Cycle 1 FOLFOXIRI8/21/2019 ? Cycle 5 FOLFOXIRI10/16/2019 ? CTs 05/01/2018-significant interval response to therapy with decreased size of the primary rectal mass lesion. Decreasing perirectal lymphadenopathy. Decreased and/or resolved pulmonary nodules. No new sites of disease identified. ? Cycle 6 FOLFOXIRI10/31/2019 ? Radiation/Xeloda 06/12/2018-completed 07/21/2018 ? Xeloda dose reduced 07/10/2018 due to mucositis and diarrhea ? CTs 08/07/2018- compared to 02/10/2018- resolved and decreased pulmonary nodules, few tiny residual noncalcified nodules, decreased 20 perirectal lymph nodes, soft tissue of the rectum indistinguishable from posterior wall of vagina-  Vaginal involvement by tumor? ? 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  Imodium as needed. 6.Mucositis secondary to Xeloda.  Improved 07/19/2018.   The patient is now admitted with wound dehiscence of her perineal wound.  Husband will be coming up today to learn how to do dressing changes and she will likely go home tomorrow. Remains on IV antibiotics.  She has anemia.  1.  Wound care as previously ordered.  Has been will learn how to perform dressing changes. 2.  Antibiotics per hospitalist. 3.  The patient is anemic with a hemoglobin of 7.3.  Recommend rechecking in the morning and transfuse 1 unit packed red blood cells if her hemoglobin is 7.0 or less. 4.  Keep outpatient follow-up with Dr. Benay Spice on 10/23/2018.  Mikey Bussing, DNP, AGPCNP-BC, AOCNP  Tina Morales has metastatic rectal cancer.  She underwent restaging CTs this week.  I reviewed the CT images and compared to CTs from January of this year.  Multiple lung nodules have enlarged.  I discussed the CT findings and treatment options with Tina Morales.  I plan to review the case with Dr. Lisbeth Renshaw, but I think she is no longer a candidate for stereotactic radiosurgery.  I recommend resuming systemic  therapy when the perineal wound has healed.  She is not a candidate for bevacizumab.  We will consider resuming FOLFOXIRI.  She has severe microcytic anemia.  We will check iron studies.

## 2018-10-05 NOTE — Progress Notes (Signed)
   10/05/18 1002  MEWS Score  Resp 18  Pulse Rate 100  BP 125/79  Temp (!) 100.6 F (38.1 C)  SpO2 98 %  O2 Device Room Air  MEWS Score  MEWS RR 0  MEWS Pulse 0  MEWS Systolic 0  MEWS LOC 0  MEWS Temp 1  MEWS Score 1  MEWS Score Color [facility]  Notified MD. Hanley Seamen 1000mg  Tylenol. Will recheck in an hour and continue to monitor.

## 2018-10-05 NOTE — Progress Notes (Signed)
Pt refused to have size bath this morning. Dr white changed wound dressing this morning. Dr white stated to pt"Will have your husband to come in for wound care teaching today and then again tomorrow". Dr Dema Severin telling this RN to pass this message to day shift nurse to let her husband to come in for wound care teaching.  Administered PRN IV dilaudid after dressing changed.will continue to monitor.

## 2018-10-05 NOTE — Telephone Encounter (Signed)
I talked to him, he also reviewed the CTs.  I think there are too many lesions for her to benefit from radiation.  We will resume systemic therapy when the perineal wound has healed.  We can revisit radiation at a later date if she has only a few lung lesions remaining  Thanks

## 2018-10-05 NOTE — Progress Notes (Signed)
Subjective No acute events. Soreness around open wound. Denies other complaints. No f/c/n/v. Colostomy working well  Objective: Vital signs in last 24 hours: Temp:  [97.7 F (36.5 C)-98.9 F (37.2 C)] 98.9 F (37.2 C) (03/26 0524) Pulse Rate:  [94-117] 98 (03/26 0524) Resp:  [13-16] 16 (03/26 0524) BP: (102-115)/(66-81) 102/66 (03/26 0524) SpO2:  [97 %-100 %] 97 % (03/26 0524) Weight:  [59.9 kg-60.9 kg] 60.9 kg (03/25 1809) Last BM Date: 10/04/18  Intake/Output from previous day: 03/25 0701 - 03/26 0700 In: 1074.1 [P.O.:370; I.V.:654.1; IV Piggyback:50] Out: 0  Intake/Output this shift: Total I/O In: 1074.1 [P.O.:370; I.V.:654.1; IV Piggyback:50] Out: 0   Gen: NAD, comfortable CV: RRR Pulm: Normal work of breathing Abd: Soft, NT/ND; colostomy productive and pink Perineum: 2 cm separation of incision with wound tracking along presacral space with some purulent drainage. Irrigated with 20cc of saline. Fibrinous exudate coating wall. All skin viable. No erythema. Ext: SCDs in place  Lab Results: CBC  Recent Labs    10/04/18 1820 10/05/18 0513  WBC 13.4* 10.4  HGB 9.6* 7.3*  HCT 32.7* 23.8*  PLT 443* 411*   BMET Recent Labs    10/04/18 1820 10/05/18 0513  NA  --  138  K  --  2.8*  CL  --  103  CO2  --  28  GLUCOSE  --  105*  BUN  --  <5*  CREATININE 0.52 0.36*  CALCIUM  --  7.9*   PT/INR No results for input(s): LABPROT, INR in the last 72 hours. ABG No results for input(s): PHART, HCO3 in the last 72 hours.  Invalid input(s): PCO2, PO2  Studies/Results:  Anti-infectives: Anti-infectives (From admission, onward)   Start     Dose/Rate Route Frequency Ordered Stop   10/05/18 0600  piperacillin-tazobactam (ZOSYN) IVPB 3.375 g     3.375 g 12.5 mL/hr over 240 Minutes Intravenous Every 8 hours 10/04/18 1953     10/04/18 1815  piperacillin-tazobactam (ZOSYN) IVPB 3.375 g     3.375 g 100 mL/hr over 30 Minutes Intravenous STAT 10/04/18 1806 10/04/18 2150        Assessment/Plan: Patient Active Problem List   Diagnosis Date Noted  . Dehiscence of perineal wound 10/04/2018  . Wound infection 10/04/2018  . Port-A-Cath in place 07/07/2018  . Thalassemia minor 03/15/2018  . Anemia of chronic disease 03/06/2018  . Metastatic cancer to lung ([place]) 03/06/2018  . Colitis 03/06/2018  . Leukocytosis 03/06/2018  . Rectal cancer ([place]) 02/20/2018  . Situational anxiety 02/20/2018  . Tobacco use 02/20/2018  . Migraine 01/04/2018   -No signs of uncontrolled infection or necrotizing process. No undrained collection on CT. Will plan local wound care with twice daily wet to dry dressing/packing -We discussed letting soap/water run through wound -Wet to dry with moist 4x4 into wound bed; cover with dry gauze and then pad -Continue IV abx today, BID dressing changes -Will have her husband come in for wound care teaching today and then again tomorrow. If she's doing well and all are comfortable, will plan potential discharge tomorrow   LOS: 1 day   Sharon Mt. Dema Severin, M.D. [facility]

## 2018-10-05 NOTE — Telephone Encounter (Signed)
I called the patient to let her know that Dr. Lisbeth Renshaw had a chance to look at her CT imaging and would offer a course of SBRT to the largest of her pulmonary nodules if the plan was to proceed with surveillance rather than systemic therapy. We would follow other nodules at short intervals to see if they would need SBRT as well. When I spoke with the patient about this she said she was under the impression Dr. Benay Spice didn't want her to have SBRT. I"ll reach out to him as this is different than his plan from his 3/5 note. I'll also find out from Dr. Dema Severin when she's fully healed so that if we were to proceed we would be clear to do this due to abdominal compression during sbrt simulation and treatment.

## 2018-10-06 ENCOUNTER — Encounter: Payer: Self-pay | Admitting: *Deleted

## 2018-10-06 LAB — COMPREHENSIVE METABOLIC PANEL
ALK PHOS: 91 U/L (ref 38–126)
ALT: 7 U/L (ref 0–44)
AST: 13 U/L — ABNORMAL LOW (ref 15–41)
Albumin: 2.2 g/dL — ABNORMAL LOW (ref 3.5–5.0)
Anion gap: 8 (ref 5–15)
BUN: 5 mg/dL — ABNORMAL LOW (ref 6–20)
CALCIUM: 7.7 mg/dL — AB (ref 8.9–10.3)
CO2: 29 mmol/L (ref 22–32)
Chloride: 102 mmol/L (ref 98–111)
Creatinine, Ser: 0.47 mg/dL (ref 0.44–1.00)
GFR calc Af Amer: >60 mL/min (ref >60)
GFR calc non Af Amer: >60 mL/min (ref >60)
GLUCOSE: 105 mg/dL — AB (ref 70–99)
Potassium: 2.8 mmol/L — ABNORMAL LOW (ref 3.5–5.1)
Sodium: 139 mmol/L (ref 135–145)
TOTAL PROTEIN: 5.1 g/dL — AB (ref 6.5–8.1)
Total Bilirubin: 0.2 mg/dL — ABNORMAL LOW (ref 0.3–1.2)

## 2018-10-06 LAB — CBC
HCT: 22.8 % — ABNORMAL LOW (ref 36.0–46.0)
Hemoglobin: 6.9 g/dL — CL (ref 12.0–15.0)
MCH: 20 pg — ABNORMAL LOW (ref 26.0–34.0)
MCHC: 30.3 g/dL (ref 30.0–36.0)
MCV: 66.1 fL — ABNORMAL LOW (ref 80.0–100.0)
Platelets: 408 10*3/uL — ABNORMAL HIGH (ref 150–400)
RBC: 3.45 MIL/uL — ABNORMAL LOW (ref 3.87–5.11)
RDW: 14.7 % (ref 11.5–15.5)
WBC: 7.3 10*3/uL (ref 4.0–10.5)
nRBC: 0 % (ref 0.0–0.2)

## 2018-10-06 MED ORDER — HEPARIN SOD (PORK) LOCK FLUSH 100 UNIT/ML IV SOLN
500.0000 [IU] | INTRAVENOUS | Status: AC | PRN
  Administered 2018-10-06: 500 [IU]

## 2018-10-06 MED ORDER — POTASSIUM CHLORIDE CRYS ER 20 MEQ PO TBCR
40.0000 meq | EXTENDED_RELEASE_TABLET | Freq: Once | ORAL | Status: AC
  Administered 2018-10-06: 40 meq via ORAL
  Filled 2018-10-06: qty 2

## 2018-10-06 NOTE — Progress Notes (Signed)
Discharge instructions discussed with patient and husband, husband at bedside, to learn dressing changes, verbalized agreement and understanding

## 2018-10-06 NOTE — Progress Notes (Addendum)
MD request for [facility] testing on case #[id number]. Tina Morales at [facility] to request testing.  Stage IV Rectal Cancer: code C20 Metastatic  10/11/18: Fax received from [facility] that sample was received today.

## 2018-10-06 NOTE — Progress Notes (Signed)
Subjective No acute events. In better spirits and feeling better. Soreness around wound improved. Denies other complaints. No f/c/n/v. Colostomy working well  Objective: Vital signs in last 24 hours: Temp:  [98.4 F (36.9 C)-100.6 F (38.1 C)] 98.4 F (36.9 C) (03/27 0604) Pulse Rate:  [71-100] 71 (03/27 0604) Resp:  [16-18] 18 (03/27 0604) BP: (108-125)/(73-84) 119/77 (03/27 0604) SpO2:  [94 %-98 %] 94 % (03/27 0604) Last BM Date: 10/04/18  Intake/Output from previous day: 03/26 0701 - 03/27 0700 In: 1742.6 [P.O.:1560; IV Piggyback:182.6] Out: 0  Intake/Output this shift: Total I/O In: 1092.6 [P.O.:960; IV Piggyback:132.6] Out: -   Gen: NAD, comfortable CV: RRR Pulm: Normal work of breathing Abd: Soft, NT/ND; colostomy productive and pink Perineum: 2 cm separation of incision with wound tracking along presacral space with some purulent drainage.Fibrinous exudate coating wall. All skin viable. No erythema. Ext: SCDs in place  Lab Results: CBC  Recent Labs    10/05/18 0513 10/06/18 0410  WBC 10.4 7.3  HGB 7.3* 6.9*  HCT 23.8* 22.8*  PLT 411* 408*   BMET Recent Labs    10/05/18 0513 10/06/18 0410  NA 138 139  K 2.8* 2.8*  CL 103 102  CO2 28 29  GLUCOSE 105* 105*  BUN <5* 5*  CREATININE 0.36* 0.47  CALCIUM 7.9* 7.7*   PT/INR No results for input(s): LABPROT, INR in the last 72 hours. ABG No results for input(s): PHART, HCO3 in the last 72 hours.  Invalid input(s): PCO2, PO2  Studies/Results:  Anti-infectives: Anti-infectives (From admission, onward)   Start     Dose/Rate Route Frequency Ordered Stop   10/05/18 0600  piperacillin-tazobactam (ZOSYN) IVPB 3.375 g     3.375 g 12.5 mL/hr over 240 Minutes Intravenous Every 8 hours 10/04/18 1953     10/04/18 1815  piperacillin-tazobactam (ZOSYN) IVPB 3.375 g     3.375 g 100 mL/hr over 30 Minutes Intravenous STAT 10/04/18 1806 10/04/18 2200       Assessment/Plan: Patient Active Problem List   Diagnosis Date Noted  . Dehiscence of perineal wound 10/04/2018  . Wound infection 10/04/2018  . Port-A-Cath in place 07/07/2018  . Thalassemia minor 03/15/2018  . Anemia of chronic disease 03/06/2018  . Metastatic cancer to lung ([place]) 03/06/2018  . Colitis 03/06/2018  . Leukocytosis 03/06/2018  . Rectal cancer ([place]) 02/20/2018  . Situational anxiety 02/20/2018  . Tobacco use 02/20/2018  . Migraine 01/04/2018   -WBC normalized; wound care seems to be going much better now -Denies dizziness, weakness, fatigue - hgb likely chronic anemia -We discussed letting soap/water run through wound -Wet to dry with moist 4x4 into wound bed; cover with dry gauze and then pad -Husband to revisit today and assist in wound care to assure he is comfortable - will make decision on discharge pending this -Potential discharge today - continue Augmentin previously and see back in office next week for wound check   LOS: 2 days   Sharon Mt. Dema Severin, M.D. [facility]

## 2018-10-06 NOTE — Progress Notes (Signed)
CRITICAL VALUE ALERT  Critical Value: Hgb 6.9  Date & Time Notied: 10/06/2018 0512  Provider Notified: 10/06/2018 0514  Orders Received/Actions taken: 1610 - no orders recieved

## 2018-10-06 NOTE — Discharge Summary (Signed)
Tina Morales Discharge Summary   Patient ID: Tina Morales MRN: [id number] DOB/AGE: 12-28-1965 53 y.o.  Admit date: 10/04/2018 Discharge date: 10/06/2018  Admitting Diagnosis: Perineal wound dehiscence   Discharge Diagnosis Patient Active Problem List   Diagnosis Date Noted  . Dehiscence of perineal wound 10/04/2018  . Wound infection 10/04/2018  . Port-A-Cath in place 07/07/2018  . Thalassemia minor 03/15/2018  . Anemia of chronic disease 03/06/2018  . Metastatic cancer to lung ([place]) 03/06/2018  . Colitis 03/06/2018  . Leukocytosis 03/06/2018  . Rectal cancer ([place]) 02/20/2018  . Situational anxiety 02/20/2018  . Tobacco use 02/20/2018  . Migraine 01/04/2018    Consultants GYN-ONC  Imaging: No results found.  Procedures Dr. Dema Severin (09/01/18) - robotic assisted abdominoperineal resection with end colostomy, bilateral transversus abdominus plane blocks  Dr. Denman George (09/01/18) - robotic assisted radical vaginectomy   [facility] Course:  Patient is a 53 year old female who presented to [facility] with perineal wound dehiscence. Patient was admitted and started on antibiotics and twice daily dressing changes. Husband instructed on wound care. On 10/06/18, the patient was voiding well, tolerating diet, ambulating well, pain well controlled, vital signs stable, wound stable and felt stable for discharge home.  Patient will follow up in our office in 1 week and knows to call with questions or concerns. She will call to confirm appointment date/time.     Allergies as of 10/06/2018      Reactions   Sulfa Antibiotics    Patient unaware of side effects, she was told when she was a child that she was allergic      Medication List    STOP taking these medications   ondansetron 8 MG tablet Commonly known as:  ZOFRAN     TAKE these medications   acetaminophen 500 MG tablet Commonly known as:  TYLENOL Take 1,000 mg by mouth every 8 (eight) hours as needed (PAIN).    ALPRAZolam 0.5 MG tablet Commonly known as:  XANAX Take 1 tablet (0.5 mg total) by mouth 2 (two) times daily as needed for anxiety.   amoxicillin-clavulanate 875-125 MG tablet Commonly known as:  AUGMENTIN Take 1 tablet by mouth 2 (two) times daily.   cyclobenzaprine 5 MG tablet Commonly known as:  FLEXERIL Take 1 tablet (5 mg total) by mouth 3 (three) times daily as needed for muscle spasms.   diphenoxylate-atropine 2.5-0.025 MG tablet Commonly known as:  LOMOTIL Take 2 tablets by mouth 4 (four) times daily as needed for diarrhea or loose stools.   ferrous sulfate 325 (65 FE) MG EC tablet Take 1 tablet (325 mg total) by mouth 2 (two) times daily with a meal.   gabapentin 300 MG capsule Commonly known as:  NEURONTIN Take 1 capsule (300 mg total) by mouth 3 (three) times daily.   Ibuprofen 200 MG Caps Take 600 mg by mouth daily as needed (pain).   lidocaine-prilocaine cream Commonly known as:  EMLA Apply 1 application topically as needed (port).   multivitamin with minerals tablet Take 1 tablet by mouth daily.   omeprazole 20 MG capsule Commonly known as:  PRILOSEC Take 20 mg by mouth daily.   oxyCODONE 5 MG immediate release tablet Commonly known as:  Oxy IR/ROXICODONE Take 5 mg by mouth every 6 (six) hours as needed. for pain   prochlorperazine 10 MG tablet Commonly known as:  COMPAZINE Take 1 tablet (10 mg total) by mouth every 6 (six) hours as needed for nausea or vomiting.   traMADol 50 MG  tablet Commonly known as:  ULTRAM Take 1 tablet (50 mg total) by mouth every 6 (six) hours as needed. What changed:  reasons to take this        Follow-up Information    Tina Roup, MD. Call.   Specialty:  General [facility] Why:  Call and arrange a follow up to be seen next week for wound check.  Contact information: [place] [address] [phone]           Signed: Brigid Re, Tina Morales [facility] 10/06/2018, 1:16  PM Pager: [phone] Consults: [phone]

## 2018-10-06 NOTE — Discharge Instructions (Signed)
WOUND CARE: - midline dressing to be changed twice daily or more often as needed - supplies: sterile saline, gauze, scissors, ABD pads, tape  - remove dressing and all packing carefully, moistening with sterile saline as needed to avoid packing/internal dressing sticking to the wound. - clean edges of skin around the wound with water/gauze, making sure there is no tape debris or leakage left on skin that could cause skin irritation or breakdown. - dampen and clean gauze with sterile saline and pack wound from wound base to skin level, making sure to take note of any possible areas of wound tracking, tunneling and packing appropriately. Wound can be packed loosely. Trim gauze to size if a whole piece is not required. - cover wound with a dry ABD pad and secure   - write the date/time on the dry dressing/tape to better track when the last dressing change occurred. - apply any skin protectant/powder recommended by clinician to protect skin/skin folds. - change dressing as needed if leakage occurs, wound gets contaminated, or patient requests to shower. - patient may shower daily with wound open and following the shower the wound should be dried and a clean dressing placed.    How to Take a [employer] A sitz bath is a warm water bath that may be used to care for your rectum, genital area, or the area between your rectum and genitals (perineum). For a sitz bath, the water only comes up to your hips and covers your buttocks. A sitz bath may done at home in a bathtub or with a portable sitz bath that fits over the toilet. Your health care provider may recommend a sitz bath to help:  Relieve pain and discomfort after delivering a baby.  Relieve pain and itching from hemorrhoids or anal fissures.  Relieve pain after certain surgeries.  Relax muscles that are sore or tight. How to take a sitz bath Take 3-4 sitz baths a day, or as many as told by your health care provider. Bathtub sitz bath To take a  sitz bath in a bathtub: 1. Partially fill a bathtub with warm water. The water should be deep enough to cover your hips and buttocks when you are sitting in the tub. 2. If your health care provider told you to put medicine in the water, follow his or her instructions. 3. Sit in the water. 4. Open the tub drain a little, and leave it open during your bath. 5. Turn on the warm water again, enough to replace the water that is draining out. Keep the water running throughout your bath. This helps keep the water at the right level and the right temperature. 6. Soak in the water for 15-20 minutes, or as long as told by your health care provider. 7. When you are done, be careful when you stand up. You may feel dizzy. 8. After the sitz bath, pat yourself dry. Do not rub your skin to dry it.  Over-the-toilet sitz bath To take a sitz bath with an over-the-toilet basin: 1. Follow the manufacturer's instructions. 2. Fill the basin with warm water. 3. If your health care provider told you to put medicine in the water, follow his or her instructions. 4. Sit on the seat. Make sure the water covers your buttocks and perineum. 5. Soak in the water for 15-20 minutes, or as long as told by your health care provider. 6. After the sitz bath, pat yourself dry. Do not rub your skin to dry it. 7. Clean and  dry the basin between uses. 8. Discard the basin if it cracks, or according to the manufacturer's instructions. Contact a health care provider if:  Your symptoms get worse. Do not continue with sitz baths if your symptoms get worse.  You have new symptoms. If this happens, do not continue with sitz baths until you talk with your health care provider. Summary  A sitz bath is a warm water bath in which the water only comes up to your hips and covers your buttocks.  A sitz bath may help relieve itching, relieve pain, and relax muscles that are sore or tight in the lower part of your body, including your genital  area.  Take 3-4 sitz baths a day, or as many as told by your health care provider. Soak in the water for 15-20 minutes.  Do not continue with sitz baths if your symptoms get worse. This information is not intended to replace advice given to you by your health care provider. Make sure you discuss any questions you have with your health care provider. Document Released: 03/20/2004 Document Revised: 06/30/2017 Document Reviewed: 06/30/2017 Elsevier Interactive Patient Education  2019 [employer].

## 2018-10-10 ENCOUNTER — Other Ambulatory Visit: Payer: Self-pay

## 2018-10-18 ENCOUNTER — Other Ambulatory Visit: Payer: Self-pay | Admitting: *Deleted

## 2018-10-18 DIAGNOSIS — C20 Malignant neoplasm of rectum: Secondary | ICD-10-CM

## 2018-10-18 MED ORDER — TRAMADOL HCL 50 MG PO TABS: 50.0000 mg | ORAL_TABLET | Freq: Four times a day (QID) | ORAL | 0 refills | Status: DC | PRN

## 2018-10-18 MED ORDER — ALPRAZOLAM 0.5 MG PO TABS: 0.5000 mg | ORAL_TABLET | Freq: Two times a day (BID) | ORAL | 0 refills | Status: DC | PRN

## 2018-10-18 NOTE — Progress Notes (Signed)
Patient called to request refill on her Tramadol and Xanax.

## 2018-10-20 ENCOUNTER — Telehealth: Payer: Self-pay | Admitting: *Deleted

## 2018-10-20 NOTE — Telephone Encounter (Signed)
Called to cancel her flush/OV on 10/23/18 per COVID Precautions. Appointment was to discuss resuming chemo when wound heals. She reports wound has improved, but not yet healed. She agrees to reschedule for 3 weeks. Her port was used in the hospital. Scheduling message sent.

## 2018-10-23 ENCOUNTER — Inpatient Hospital Stay: Payer: 59

## 2018-10-23 ENCOUNTER — Inpatient Hospital Stay: Payer: 59 | Admitting: Oncology

## 2018-10-24 ENCOUNTER — Encounter (HOSPITAL_COMMUNITY): Payer: Self-pay | Admitting: Oncology

## 2018-10-24 ENCOUNTER — Telehealth: Payer: Self-pay | Admitting: Oncology

## 2018-10-24 NOTE — Telephone Encounter (Signed)
Scheduled appt per sch msg. Called patient, no answer. Left msg and mailed printout.

## 2018-11-13 ENCOUNTER — Inpatient Hospital Stay: Payer: 59 | Attending: Oncology | Admitting: Oncology

## 2018-11-13 ENCOUNTER — Inpatient Hospital Stay: Payer: 59

## 2018-11-13 ENCOUNTER — Other Ambulatory Visit: Payer: Self-pay

## 2018-11-13 VITALS — BP 105/83 | HR 102 | Temp 97.9°F | Resp 18 | Ht 67.0 in | Wt 117.9 lb

## 2018-11-13 DIAGNOSIS — C20 Malignant neoplasm of rectum: Secondary | ICD-10-CM

## 2018-11-13 DIAGNOSIS — R200 Anesthesia of skin: Secondary | ICD-10-CM | POA: Insufficient documentation

## 2018-11-13 DIAGNOSIS — T8131XA Disruption of external operation (surgical) wound, not elsewhere classified, initial encounter: Secondary | ICD-10-CM | POA: Diagnosis not present

## 2018-11-13 DIAGNOSIS — D649 Anemia, unspecified: Secondary | ICD-10-CM | POA: Diagnosis not present

## 2018-11-13 DIAGNOSIS — R918 Other nonspecific abnormal finding of lung field: Secondary | ICD-10-CM | POA: Diagnosis not present

## 2018-11-13 DIAGNOSIS — Z5111 Encounter for antineoplastic chemotherapy: Secondary | ICD-10-CM | POA: Diagnosis not present

## 2018-11-13 DIAGNOSIS — Y833 Surgical operation with formation of external stoma as the cause of abnormal reaction of the patient, or of later complication, without mention of misadventure at the time of the procedure: Secondary | ICD-10-CM | POA: Diagnosis not present

## 2018-11-13 DIAGNOSIS — C7982 Secondary malignant neoplasm of genital organs: Secondary | ICD-10-CM | POA: Diagnosis present

## 2018-11-13 DIAGNOSIS — Z95828 Presence of other vascular implants and grafts: Secondary | ICD-10-CM

## 2018-11-13 DIAGNOSIS — Z87891 Personal history of nicotine dependence: Secondary | ICD-10-CM | POA: Diagnosis not present

## 2018-11-13 DIAGNOSIS — Z933 Colostomy status: Secondary | ICD-10-CM | POA: Diagnosis not present

## 2018-11-13 DIAGNOSIS — D569 Thalassemia, unspecified: Secondary | ICD-10-CM | POA: Diagnosis not present

## 2018-11-13 MED ORDER — HEPARIN SOD (PORK) LOCK FLUSH 100 UNIT/ML IV SOLN
500.0000 [IU] | Freq: Once | INTRAVENOUS | Status: AC
  Administered 2018-11-13: 500 [IU]
  Filled 2018-11-13: qty 5

## 2018-11-13 MED ORDER — SODIUM CHLORIDE 0.9% FLUSH
10.0000 mL | Freq: Once | INTRAVENOUS | Status: AC
  Administered 2018-11-13: 10 mL
  Filled 2018-11-13: qty 10

## 2018-11-13 NOTE — Progress Notes (Signed)
[place] OFFICE PROGRESS NOTE   Diagnosis: Rectal cancer  INTERVAL HISTORY:   Ms. Hollingshead was admitted 10/04/2018 with wound dehiscence.  She reports the wound is now healing.  Her husband packs the wound twice daily.  The colostomy is functioning well.  She reports a good appetite.  She was scheduled to undergo SBRT to lung lesions.  A restaging CT of the chest 09/29/2018 revealed an increase in size of pulmonary nodules.  Her case was presented at the GI tumor conference and I reviewed the case with Dr. Lisbeth Renshaw.  We decided she was not a candidate for SBRT due to the number of lung lesions.  Ms. Woolridge continues to have numbness in the feet.  This does not interfere with activity.  The numbness is stable. Objective:  Vital signs in last 24 hours:  Blood pressure 105/83, pulse (!) 102, temperature 97.9 F (36.6 C), temperature source Oral, resp. rate 18, height 5' 7"  (1.702 m), weight 117 lb 14.4 oz (53.5 kg), SpO2 99 %.     GI: No hepatomegaly, left lower quadrant colostomy, no mass Vascular: No leg edema Skin: There is a less than 1 cm opening at the mid aspect of the perineal wound with a gauze packing in place, no surrounding erythema, the remainder of the wound is healed  Portacath/PICC-without erythema  Lab Results:  Lab Results  Component Value Date   WBC 7.3 10/06/2018   HGB 6.9 (LL) 10/06/2018   HCT 22.8 (L) 10/06/2018   MCV 66.1 (L) 10/06/2018   PLT 408 (H) 10/06/2018   NEUTROABS 7.9 (H) 08/30/2018    CMP  Lab Results  Component Value Date   NA 139 10/06/2018   K 2.8 (L) 10/06/2018   CL 102 10/06/2018   CO2 29 10/06/2018   GLUCOSE 105 (H) 10/06/2018   BUN 5 (L) 10/06/2018   CREATININE 0.47 10/06/2018   CALCIUM 7.7 (L) 10/06/2018   PROT 5.1 (L) 10/06/2018   ALBUMIN 2.2 (L) 10/06/2018   AST 13 (L) 10/06/2018   ALT 7 10/06/2018   ALKPHOS 91 10/06/2018   BILITOT 0.2 (L) 10/06/2018   GFRNONAA >60 10/06/2018   GFRAA >60 10/06/2018    Lab  Results  Component Value Date   CEA1 3.09 08/07/2018     Medications: I have reviewed the patient's current medications.   Assessment/Plan:  1. Rectal cancer-rectal mass noted on digital exam 02/10/2018, colonoscopy confirmed a him my circumferential mass in the rectum ? 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 CTs 02/10/2018-anterior rectal wall thickening, pulmonary nodules measuring up to 9 mm concerning for metastases, few round perirectal lymph nodes ? Pelvic MRI 02/20/2018-hypermetabolic low rectal mass extending to the posterior vagina with 2 small enlarged perirectal lymph nodes, T4b,N1-2.3 cm from the anal verge ? PET scan 02/23/2018-hypermetabolic rectal mass, 8 mm hypermetabolic lingular nodule, scattered small bilateral lung nodules, some calcified, a few with mildly increased activity ? Cycle 1 FOLFOXIRI8/21/2019 ? Cycle 5 FOLFOXIRI10/16/2019 ? CTs 05/01/2018-significant interval response to therapy with decreased size of the primary rectal mass lesion. Decreasing perirectal lymphadenopathy. Decreased and/or resolved pulmonary nodules. No new sites of disease identified. ? Cycle 6 FOLFOXIRI10/31/2019 ? Radiation/Xeloda 06/12/2018-completed 07/21/2018 ? Xeloda dose reduced 07/10/2018 due to mucositis and diarrhea ? CTs 08/07/2018- compared to 02/10/2018- resolved and decreased pulmonary nodules, few tiny residual noncalcified  nodules, decreased 20 perirectal lymph nodes, soft tissue of the rectum indistinguishable from posterior wall of vagina-  Vaginal involvement by tumor? ? APR/vaginectomy 09/01/2018-ypT4,ypNo, negative resection margins, involvement of the vagina per review of slides at GI tumor conference, no treatment effect-tumor regression  score 3, no loss of mismatch repair protein expression, MSI-stable ? K-ras G12V mutation ? 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   Diarrhea secondary to Xeloda and radiation.  Imodium as needed. 6.   Mucositis secondary to Xeloda.  Improved 07/19/2018.    Disposition: Ms. Corporan appears well.  The perineal wound has almost completely healed.  The restaging chest CT in March revealed progression of lung nodules.  I reviewed the case with Dr. Lisbeth Renshaw.  She is felt to not be a candidate for SBRT at present secondary to the number of malignant appearing lung nodules.  I discussed the CT findings and treatment options with Ms. Moger.  I recommend resuming systemic therapy.  The tumor has a K-ras mutation which makes her ineligible for an EGFR inhibitor.  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  We will discontinue oxaliplatin if she has worsening neuropathy.  We will schedule a repeat chest CT prior to beginning treatment.  The plan is to begin treatment on 11/29/2018.  Betsy Coder, MD  11/13/2018  12:26 PM

## 2018-11-14 ENCOUNTER — Telehealth: Payer: Self-pay | Admitting: Nurse Practitioner

## 2018-11-14 NOTE — Telephone Encounter (Signed)
Scheduled appt per 5/4 los.  Added treatment in the book for approval.

## 2018-11-15 ENCOUNTER — Telehealth: Payer: Self-pay | Admitting: Oncology

## 2018-11-15 NOTE — Telephone Encounter (Signed)
Called patient to inform them that their treatment has been added.  Left a voice message of scheduled appt.

## 2018-11-26 ENCOUNTER — Other Ambulatory Visit: Payer: Self-pay | Admitting: Oncology

## 2018-11-27 ENCOUNTER — Ambulatory Visit (HOSPITAL_COMMUNITY)
Admission: RE | Admit: 2018-11-27 | Discharge: 2018-11-27 | Disposition: A | Discharge status: Home / Self Care | Payer: 59 | Source: Ambulatory Visit | Attending: Oncology | Admitting: Oncology

## 2018-11-27 ENCOUNTER — Other Ambulatory Visit: Payer: Self-pay

## 2018-11-27 DIAGNOSIS — C20 Malignant neoplasm of rectum: Secondary | ICD-10-CM | POA: Diagnosis present

## 2018-11-28 NOTE — Progress Notes (Addendum)
[place]   [phone] [phone]   Clinic Follow up Note   Patient Care Team: Medicine, [place] as PCP - General (Family Medicine) [name], MD as Consulting Physician (General Surgery) Everitt Amber, MD as Consulting Physician (Gynecologic Oncology) Ladell Pier, MD as Consulting Physician (Oncology) 11/29/2018  CHIEF COMPLAINT: f/u rectal cancer   SUMMARY OF ONCOLOGIC HISTORY:   Rectal cancer ([place])   04/27/2018 Initial Diagnosis    Rectal cancer ([place])    05/11/2018 -  Chemotherapy    The patient had palonosetron (ALOXI) injection 0.25 mg, 0.25 mg, Intravenous,  Once, 2 of 6 cycles Administration: 0.25 mg (05/11/2018) irinotecan (CAMPTOSAR) 220 mg in dextrose 5 % 500 mL chemo infusion, 150 mg/m2 = 220 mg (100 % of original dose 150 mg/m2), Intravenous,  Once, 2 of 6 cycles Dose modification: 150 mg/m2 (original dose 150 mg/m2, Cycle 1, Reason: Provider Judgment) Administration: 220 mg (05/11/2018) leucovorin 306 mg in dextrose 5 % 250 mL infusion, 200 mg/m2 = 306 mg, Intravenous,  Once, 2 of 6 cycles Administration: 306 mg (05/11/2018) oxaliplatin (ELOXATIN) 130 mg in dextrose 5 % 500 mL chemo infusion, 85 mg/m2 = 130 mg, Intravenous,  Once, 2 of 6 cycles Administration: 130 mg (05/11/2018) fosaprepitant (EMEND) 150 mg, dexamethasone (DECADRON) 12 mg in sodium chloride 0.9 % 145 mL IVPB, , Intravenous,  Once, 2 of 6 cycles Administration:  (05/11/2018) fluorouracil (ADRUCIL) 5,000 mg in sodium chloride 0.9 % 150 mL chemo infusion, 3,260 mg/m2 = 4,900 mg, Intravenous, 1 Day/Dose, 2 of 6 cycles Administration: 5,000 mg (05/11/2018)  for chemotherapy treatment.      CURRENT THERAPY: Restart FOLFOXIRI q2 weeks, beginning 11/29/18  INTERVAL HISTORY: Ms. Haff returns for follow up and treatment as scheduled. She feels well. Energy and appetite are good. Stool output is mostly soft to lose, no blood. Output  is higher at night. Empties 2-3 times per day. Denies nausea or vomiting. Perineal wound has nearly resolved, only pin point opening which she keeps covered with dressing, no drainage or redness. Mild soreness controlled with tylenol and ibuprofen. She has recurrent migraines every Sunday or Monday for last 3 weeks, lasting 3 days. She gets visual aura. Questions stress, weather, sleep, and diet related. Drinks caffeine but no alcohol. Has mild residual numbness to toes, not limiting function, balance, or gait. None in fingertips. Otherwise denies fever, chills, cough, chest pain, dyspnea, or leg swelling.     MEDICAL HISTORY:  Past Medical History:  Diagnosis Date   Adenocarcinoma ([place]) 02/2018   Anxiety    Cancer ([place]) 02/10/2018   Stage 4 Colorectal Cancer, spot on her lungs   Migraine     SURGICAL HISTORY: Past Surgical History:  Procedure Laterality Date   ADENOIDECTOMY     CYSTOSCOPY W/ RETROGRADES Bilateral 09/01/2018   Procedure: CYSTOSCOPY WITH BILATERAL  RETROGRADE PYELOGRAM;  Surgeon: Alexis Frock, MD;  Location: [facility];  Service: Urology;  Laterality: Bilateral;   CYSTOSCOPY WITH STENT PLACEMENT Bilateral 09/01/2018   Procedure: CYSTOSCOPY WITH URETERAL COOK CATHETER PLACEMENT;  Surgeon: Alexis Frock, MD;  Location: [facility];  Service: Urology;  Laterality: Bilateral;   ROBOTIC ASSISTED TOTAL HYSTERECTOMY WITH BILATERAL SALPINGO OOPHERECTOMY Bilateral 09/01/2018   Procedure: XI ROBOTIC ASSISTED PARTIAL VAGINECTOMY;  Surgeon: Everitt Amber, MD;  Location: [facility];  Service: Gynecology;  Laterality: Bilateral;   TONSILLECTOMY      I have reviewed the social history and family history with the patient and they are unchanged  from previous note.  ALLERGIES:  is allergic to sulfa antibiotics.  MEDICATIONS:  Current Outpatient Medications  Medication Sig Dispense Refill   acetaminophen (TYLENOL) 500 MG tablet Take 1,000 mg by mouth every 8 (eight) hours as needed  (PAIN).      ALPRAZolam (XANAX) 0.5 MG tablet Take 1 tablet (0.5 mg total) by mouth 2 (two) times daily as needed for anxiety. 60 tablet 0   ferrous sulfate 325 (65 FE) MG EC tablet Take 1 tablet (325 mg total) by mouth 2 (two) times daily with a meal.  3   Ibuprofen 200 MG CAPS Take 600 mg by mouth daily as needed (pain).      lidocaine-prilocaine (EMLA) cream Apply 1 application topically as needed (port).      Multiple Vitamins-Minerals (MULTIVITAMIN WITH MINERALS) tablet Take 1 tablet by mouth daily.     cyclobenzaprine (FLEXERIL) 5 MG tablet Take 1 tablet (5 mg total) by mouth 3 (three) times daily as needed for muscle spasms. (Patient not taking: Reported on 11/13/2018) 30 tablet 3   dexamethasone (DECADRON) 4 MG tablet Take 1 tablet (4 mg total) by mouth 2 (two) times daily. Take for 3 days after chemotherapy, begin day after treatment 6 tablet 3   diphenoxylate-atropine (LOMOTIL) 2.5-0.025 MG tablet Take 2 tablets by mouth 4 (four) times daily as needed for diarrhea or loose stools. (Patient not taking: Reported on 10/04/2018) 30 tablet 0   gabapentin (NEURONTIN) 300 MG capsule Take 1 capsule (300 mg total) by mouth 3 (three) times daily. (Patient not taking: Reported on 11/13/2018) 90 capsule 1   omeprazole (PRILOSEC) 20 MG capsule Take 20 mg by mouth daily.     prochlorperazine (COMPAZINE) 10 MG tablet Take 1 tablet (10 mg total) by mouth every 6 (six) hours as needed for nausea or vomiting. (Patient not taking: Reported on 11/13/2018) 20 tablet 1   traMADol (ULTRAM) 50 MG tablet Take 1 tablet (50 mg total) by mouth every 6 (six) hours as needed. (Patient not taking: Reported on 11/13/2018) 60 tablet 0   No current facility-administered medications for this visit.    Facility-Administered Medications Ordered in Other Visits  Medication Dose Route Frequency Provider Last Rate Last Dose   dextrose 5 % solution   Intravenous Once Ladell Pier, MD       fluorouracil (ADRUCIL) 5,000  mg in sodium chloride 0.9 % 150 mL chemo infusion  3,150 mg/m2 (Treatment Plan Recorded) Intravenous 1 day or 1 dose Ladell Pier, MD       fosaprepitant (EMEND) 150 mg, dexamethasone (DECADRON) 12 mg in sodium chloride 0.9 % 145 mL IVPB   Intravenous Once Ladell Pier, MD       heparin lock flush 100 unit/mL  500 Units Intracatheter Once PRN Ladell Pier, MD       irinotecan (CAMPTOSAR) 240 mg in dextrose 5 % 500 mL chemo infusion  150 mg/m2 (Treatment Plan Recorded) Intravenous Once Ladell Pier, MD       leucovorin 318 mg in dextrose 5 % 250 mL infusion  200 mg/m2 (Treatment Plan Recorded) Intravenous Once Ladell Pier, MD       oxaliplatin (ELOXATIN) 135 mg in dextrose 5 % 500 mL chemo infusion  85 mg/m2 (Treatment Plan Recorded) Intravenous Once Ladell Pier, MD       sodium chloride flush (NS) 0.9 % injection 10 mL  10 mL Intracatheter PRN Ladell Pier, MD        PHYSICAL  EXAMINATION:  Vitals:   11/29/18 0929  BP: 115/87  Pulse: (!) 106  Resp: 18  Temp: 98.4 F (36.9 C)  SpO2: 98%   Filed Weights   11/29/18 0929  Weight: 123 lb 11.2 oz (56.1 kg)    GENERAL:alert, no distress and comfortable SKIN:  no rashes  EYES: sclera clear LUNGS: respirations even and unlabored HEART:  no lower extremity edema ABDOMEN: LLQ colostomy. Abdominal incisions are well healed. Pinpoint opening of the perineal wound with no surrounding erythema or drainage, otherwise healed  NEURO: alert & oriented x 3 with fluent speech. Mildly decreased vibratory sense to feet bilaterally per tuning fork exam PAC without erythema Limited exam given COVID19 outbreak  LABORATORY DATA:  I have reviewed the data as listed CBC Latest Ref Rng & Units 11/29/2018 10/06/2018 10/05/2018  WBC 4.0 - 10.5 K/uL 9.6 7.3 10.4  Hemoglobin 12.0 - 15.0 g/dL 10.3(L) 6.9(LL) 7.3(L)  Hematocrit 36.0 - 46.0 % 33.9(L) 22.8(L) 23.8(L)  Platelets 150 - 400 K/uL 260 408(H) 411(H)     CMP Latest  Ref Rng & Units 11/29/2018 10/06/2018 10/05/2018  Glucose 70 - 99 mg/dL 89 105(H) 105(H)  BUN 6 - 20 mg/dL 7 5(L) <5(L)  Creatinine 0.44 - 1.00 mg/dL 0.62 0.47 0.36(L)  Sodium 135 - 145 mmol/L 133(L) 139 138  Potassium 3.5 - 5.1 mmol/L 4.3 2.8(L) 2.8(L)  Chloride 98 - 111 mmol/L 100 102 103  CO2 22 - 32 mmol/L 25 29 28   Calcium 8.9 - 10.3 mg/dL 8.7(L) 7.7(L) 7.9(L)  Total Protein 6.5 - 8.1 g/dL 6.7 5.1(L) 5.5(L)  Total Bilirubin 0.3 - 1.2 mg/dL 0.3 0.2(L) 0.4  Alkaline Phos 38 - 126 U/L 90 91 122  AST 15 - 41 U/L 13(L) 13(L) 11(L)  ALT 0 - 44 U/L 11 7 6       RADIOGRAPHIC STUDIES: I have personally reviewed the radiological images as listed and agreed with the findings in the report. Ct Chest Wo Contrast  Result Date: 11/28/2018 CLINICAL DATA:  Restaging rectal cancer. Follow-up pulmonary nodules. EXAM: CT CHEST WITHOUT CONTRAST TECHNIQUE: Multidetector CT imaging of the chest was performed following the standard protocol without IV contrast. COMPARISON:  09/29/2018 CT chest. FINDINGS: Cardiovascular: Normal heart size. No significant pericardial effusion/thickening. Left anterior descending coronary atherosclerosis. Right internal jugular Port-A-Cath terminates in the lower third of the SVC. Atherosclerotic nonaneurysmal thoracic aorta. Normal caliber pulmonary arteries. Mediastinum/Nodes: No discrete thyroid nodules. Unremarkable esophagus. No pathologically enlarged axillary, mediastinal or hilar lymph nodes, noting limited sensitivity for the detection of hilar adenopathy on this noncontrast study. Lungs/Pleura: No pneumothorax. No pleural effusion. Mild to moderate centrilobular emphysema. No acute consolidative airspace disease. Numerous (greater than 15) solid pulmonary nodules scattered throughout both lungs, increased in size since 09/29/2018 chest CT. For example, right upper lobe 1.0 cm nodule (series 7/image 40), increased from 0.6 cm. Medial right upper lobe 0.9 cm nodule (series  7/image 40), increased from 0.4 cm. Right lower lobe 1.2 x 1.2 cm nodule (series 7/image 104), increased from 1.0 x 1.0 cm. Left upper lobe 0.5 cm nodule (series 7/image 112), increased from 0.3 cm. Upper abdomen: No acute abnormality. Musculoskeletal:  No aggressive appearing focal osseous lesions. IMPRESSION: 1. Interval growth of numerous solid pulmonary metastases scattered in both lungs compared to 09/29/2018 chest CT. 2. No thoracic adenopathy. 3. One vessel coronary atherosclerosis. Aortic Atherosclerosis (ICD10-I70.0) and Emphysema (ICD10-J43.9). Electronically Signed   By: Ilona Sorrel M.D.   On: 11/28/2018 09:28     ASSESSMENT &  PLAN:    1. Rectal cancer-rectal mass noted on digital exam 02/10/2018, colonoscopy confirmed a him my circumferential mass in the rectum ? 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 CTs 02/10/2018-anterior rectal wall thickening, pulmonary nodules measuring up to 9 mm concerning for metastases, few round perirectal lymph nodes ? Pelvic MRI 02/20/2018-hypermetabolic low rectal mass extending to the posterior vagina with 2 small enlarged perirectal lymph nodes, T4b,N1-2.3 cm from the anal verge ? PET scan 02/23/2018-hypermetabolic rectal mass, 8 mm hypermetabolic lingular nodule, scattered small bilateral lung nodules, some calcified, a few with mildly increased activity ? Cycle 1 FOLFOXIRI8/21/2019 ? Cycle 5 FOLFOXIRI10/16/2019 ? CTs 05/01/2018-significant interval response to therapy with decreased size of the primary rectal mass lesion. Decreasing perirectal lymphadenopathy. Decreased and/or resolved pulmonary nodules. No new sites of disease identified. ? Cycle 6 FOLFOXIRI10/31/2019 ? Radiation/Xeloda 06/12/2018-completed  07/21/2018 ? Xeloda dose reduced 07/10/2018 due to mucositis and diarrhea ? CTs 08/07/2018- compared to 02/10/2018- resolved and decreased pulmonary nodules, few tiny residual noncalcified nodules, decreased 20 perirectal lymph nodes, soft tissue of the rectum indistinguishable from posterior wall of vagina-  Vaginal involvement by tumor? ? APR/vaginectomy 09/01/2018-ypT4,ypNo, negative resection margins, involvement of the vagina per review of slides at GI tumor conference, no treatment effect-tumor regression score 3, no loss of mismatch repair protein expression, MSI-stable ? K-ras G12V mutation ? CT chest 09/29/2018- enlargement of lung nodules, not a candidate for SBRT based on discussion in GI tumor conference and with radiation oncology ? CT chest 11/27/18 - interval growth of numerous pulmonary metastases bilaterally compared to 09/29/18, no new pulmonary mets  ? 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  Imodium as needed. 6.Mucositis secondary to Xeloda.  Improved 07/19/2018. 7.   History of migraines, has experienced weekly migraines for past 3 weeks (11/29/18)   Dispo: Ms. Henrickson appears well. The perineal wound has nearly healed. She underwent restaging CT chest for new baseline on 5/18 which reveals interval growth of lung nodules, no new pulmonary metastases. The patient was seen by Dr. Benay Spice who reviewed her CT images with her and recommends to restart chemotherapy with FOLFOXIRI as previously discussed. She previously did not receive neulasta. She has mild neuropathy in her feet, will monitor closely. She previously took a short course of decadron after chemo which helped her fatigue and nausea, will refill. Also refilled xanax today which she takes occasionally at night.   Labs  reviewed, CBC and CMP adequate to proceed with chemotherapy today. CEA is pending. She will return for f/u with Dr. Benay Spice in 2 weeks with next cycle. Plan for 4 cycles then restage.   All questions were answered. The patient knows to call the clinic with any problems, questions or concerns. No barriers to learning was detected.     Alla Feeling, NP 11/29/18  This was a shared visit with Hilma Favors.  The chest CT 11/27/2018 confirms enlargement of lung nodules.  We reviewed the CT images with Ms. Grizzle.  We discussed the treatment plan.  She will begin FOLFOXIRI today.  She will undergo a repeat CT after 4 cycles.  Julieanne Manson, MD

## 2018-11-29 ENCOUNTER — Inpatient Hospital Stay (HOSPITAL_BASED_OUTPATIENT_CLINIC_OR_DEPARTMENT_OTHER): Payer: 59 | Admitting: Nurse Practitioner

## 2018-11-29 ENCOUNTER — Inpatient Hospital Stay: Payer: 59

## 2018-11-29 ENCOUNTER — Other Ambulatory Visit: Payer: Self-pay

## 2018-11-29 ENCOUNTER — Telehealth: Payer: Self-pay | Admitting: Nurse Practitioner

## 2018-11-29 ENCOUNTER — Encounter: Payer: Self-pay | Admitting: Nurse Practitioner

## 2018-11-29 VITALS — HR 95

## 2018-11-29 VITALS — BP 115/87 | HR 106 | Temp 98.4°F | Resp 18 | Ht 67.0 in | Wt 123.7 lb

## 2018-11-29 DIAGNOSIS — T8131XA Disruption of external operation (surgical) wound, not elsewhere classified, initial encounter: Secondary | ICD-10-CM

## 2018-11-29 DIAGNOSIS — C20 Malignant neoplasm of rectum: Secondary | ICD-10-CM

## 2018-11-29 DIAGNOSIS — C7982 Secondary malignant neoplasm of genital organs: Secondary | ICD-10-CM | POA: Diagnosis not present

## 2018-11-29 DIAGNOSIS — Y833 Surgical operation with formation of external stoma as the cause of abnormal reaction of the patient, or of later complication, without mention of misadventure at the time of the procedure: Secondary | ICD-10-CM

## 2018-11-29 DIAGNOSIS — Z87891 Personal history of nicotine dependence: Secondary | ICD-10-CM

## 2018-11-29 DIAGNOSIS — D649 Anemia, unspecified: Secondary | ICD-10-CM

## 2018-11-29 DIAGNOSIS — Z95828 Presence of other vascular implants and grafts: Secondary | ICD-10-CM

## 2018-11-29 DIAGNOSIS — R918 Other nonspecific abnormal finding of lung field: Secondary | ICD-10-CM | POA: Diagnosis not present

## 2018-11-29 DIAGNOSIS — D569 Thalassemia, unspecified: Secondary | ICD-10-CM

## 2018-11-29 DIAGNOSIS — G43909 Migraine, unspecified, not intractable, without status migrainosus: Secondary | ICD-10-CM

## 2018-11-29 DIAGNOSIS — G629 Polyneuropathy, unspecified: Secondary | ICD-10-CM

## 2018-11-29 DIAGNOSIS — Z5111 Encounter for antineoplastic chemotherapy: Secondary | ICD-10-CM | POA: Diagnosis not present

## 2018-11-29 LAB — CBC WITH DIFFERENTIAL (CANCER CENTER ONLY)
Abs Immature Granulocytes: 0.02 10*3/uL (ref 0.00–0.07)
Basophils Absolute: 0 10*3/uL (ref 0.0–0.1)
Basophils Relative: 0 %
Eosinophils Absolute: 0.2 10*3/uL (ref 0.0–0.5)
Eosinophils Relative: 2 %
HCT: 33.9 % — ABNORMAL LOW (ref 36.0–46.0)
Hemoglobin: 10.3 g/dL — ABNORMAL LOW (ref 12.0–15.0)
Immature Granulocytes: 0 %
Lymphocytes Relative: 12 %
Lymphs Abs: 1.2 10*3/uL (ref 0.7–4.0)
MCH: 19 pg — ABNORMAL LOW (ref 26.0–34.0)
MCHC: 30.4 g/dL (ref 30.0–36.0)
MCV: 62.5 fL — ABNORMAL LOW (ref 80.0–100.0)
Monocytes Absolute: 0.5 10*3/uL (ref 0.1–1.0)
Monocytes Relative: 6 %
Neutro Abs: 7.7 10*3/uL (ref 1.7–7.7)
Neutrophils Relative %: 80 %
Platelet Count: 260 10*3/uL (ref 150–400)
RBC: 5.42 MIL/uL — ABNORMAL HIGH (ref 3.87–5.11)
RDW: 16.9 % — ABNORMAL HIGH (ref 11.5–15.5)
WBC Count: 9.6 10*3/uL (ref 4.0–10.5)
nRBC: 0 % (ref 0.0–0.2)

## 2018-11-29 LAB — CMP (CANCER CENTER ONLY)
ALT: 11 U/L (ref 0–44)
AST: 13 U/L — ABNORMAL LOW (ref 15–41)
Albumin: 3.6 g/dL (ref 3.5–5.0)
Alkaline Phosphatase: 90 U/L (ref 38–126)
Anion gap: 8 (ref 5–15)
BUN: 7 mg/dL (ref 6–20)
CO2: 25 mmol/L (ref 22–32)
Calcium: 8.7 mg/dL — ABNORMAL LOW (ref 8.9–10.3)
Chloride: 100 mmol/L (ref 98–111)
Creatinine: 0.62 mg/dL (ref 0.44–1.00)
GFR, Est AFR Am: >60 mL/min (ref >60)
GFR, Estimated: >60 mL/min (ref >60)
Glucose, Bld: 89 mg/dL (ref 70–99)
Potassium: 4.3 mmol/L (ref 3.5–5.1)
Sodium: 133 mmol/L — ABNORMAL LOW (ref 135–145)
Total Bilirubin: 0.3 mg/dL (ref 0.3–1.2)
Total Protein: 6.7 g/dL (ref 6.5–8.1)

## 2018-11-29 LAB — CEA (IN HOUSE-CHCC): CEA (CHCC-In House): 1.84 ng/mL (ref 0.00–5.00)

## 2018-11-29 MED ORDER — ATROPINE SULFATE 0.4 MG/ML IJ SOLN
0.4000 mg | Freq: Once | INTRAMUSCULAR | Status: AC | PRN
  Administered 2018-11-29: 0.4 mg via INTRAVENOUS

## 2018-11-29 MED ORDER — ATROPINE SULFATE 0.4 MG/ML IJ SOLN: 0.5000 mg | Freq: Once | INTRAMUSCULAR | Status: DC

## 2018-11-29 MED ORDER — ALPRAZOLAM 0.5 MG PO TABS: 0.5000 mg | ORAL_TABLET | Freq: Two times a day (BID) | ORAL | 0 refills | Status: DC | PRN

## 2018-11-29 MED ORDER — OXALIPLATIN CHEMO INJECTION 100 MG/20ML
85.0000 mg/m2 | Freq: Once | INTRAVENOUS | Status: AC
  Administered 2018-11-29: 135 mg via INTRAVENOUS
  Filled 2018-11-29: qty 20

## 2018-11-29 MED ORDER — SODIUM CHLORIDE 0.9 % IV SOLN
Freq: Once | INTRAVENOUS | Status: AC
  Administered 2018-11-29: 11:00:00 via INTRAVENOUS
  Filled 2018-11-29: qty 5

## 2018-11-29 MED ORDER — PALONOSETRON HCL INJECTION 0.25 MG/5ML
0.2500 mg | Freq: Once | INTRAVENOUS | Status: AC
  Administered 2018-11-29: 0.25 mg via INTRAVENOUS

## 2018-11-29 MED ORDER — ATROPINE SULFATE 1 MG/ML IJ SOLN: 0.5000 mg | Freq: Once | INTRAMUSCULAR | Status: DC

## 2018-11-29 MED ORDER — ATROPINE SULFATE 1 MG/ML IJ SOLN: 0.5000 mg | Freq: Once | INTRAMUSCULAR | Status: DC | PRN

## 2018-11-29 MED ORDER — DEXAMETHASONE 4 MG PO TABS: 4.0000 mg | ORAL_TABLET | Freq: Two times a day (BID) | ORAL | 3 refills | Status: DC

## 2018-11-29 MED ORDER — ATROPINE SULFATE 0.4 MG/ML IJ SOLN
INTRAMUSCULAR | Status: AC
  Filled 2018-11-29: qty 1

## 2018-11-29 MED ORDER — SODIUM CHLORIDE 0.9 % IV SOLN
3150.0000 mg/m2 | INTRAVENOUS | Status: DC
  Administered 2018-11-29: 5000 mg via INTRAVENOUS
  Filled 2018-11-29: qty 100

## 2018-11-29 MED ORDER — DEXTROSE 5 % IV SOLN
Freq: Once | INTRAVENOUS | Status: AC
  Administered 2018-11-29: 14:00:00 via INTRAVENOUS
  Filled 2018-11-29: qty 250

## 2018-11-29 MED ORDER — SODIUM CHLORIDE 0.9% FLUSH
10.0000 mL | Freq: Once | INTRAVENOUS | Status: AC
  Administered 2018-11-29: 10 mL
  Filled 2018-11-29: qty 10

## 2018-11-29 MED ORDER — PALONOSETRON HCL INJECTION 0.25 MG/5ML
INTRAVENOUS | Status: AC
  Filled 2018-11-29: qty 5

## 2018-11-29 MED ORDER — IRINOTECAN HCL CHEMO INJECTION 100 MG/5ML
150.0000 mg/m2 | Freq: Once | INTRAVENOUS | Status: AC
  Administered 2018-11-29: 240 mg via INTRAVENOUS
  Filled 2018-11-29: qty 12

## 2018-11-29 MED ORDER — DEXTROSE 5 % IV SOLN
Freq: Once | INTRAVENOUS | Status: AC
  Administered 2018-11-29: 11:00:00 via INTRAVENOUS
  Filled 2018-11-29: qty 250

## 2018-11-29 MED ORDER — LEUCOVORIN CALCIUM INJECTION 350 MG
200.0000 mg/m2 | Freq: Once | INTRAVENOUS | Status: AC
  Administered 2018-11-29: 14:00:00 318 mg via INTRAVENOUS
  Filled 2018-11-29: qty 15.9

## 2018-11-29 MED ORDER — SODIUM CHLORIDE 0.9% FLUSH
10.0000 mL | INTRAVENOUS | Status: DC | PRN
  Filled 2018-11-29: qty 10

## 2018-11-29 MED ORDER — HEPARIN SOD (PORK) LOCK FLUSH 100 UNIT/ML IV SOLN
500.0000 [IU] | Freq: Once | INTRAVENOUS | Status: DC | PRN
  Filled 2018-11-29: qty 5

## 2018-11-29 NOTE — Patient Instructions (Signed)
[facility] Discharge Instructions for Patients Receiving Chemotherapy  Today you received the following chemotherapy agents Irinotecan, Oxaliplatin, Fluorouracil, 5-FU injection  To help prevent nausea and vomiting after your treatment, we encourage you to take your nausea medication as directed.   If you develop nausea and vomiting that is not controlled by your nausea medication, call the clinic.   BELOW ARE SYMPTOMS THAT SHOULD BE REPORTED IMMEDIATELY:  *FEVER GREATER THAN 100.5 F  *CHILLS WITH OR WITHOUT FEVER  NAUSEA AND VOMITING THAT IS NOT CONTROLLED WITH YOUR NAUSEA MEDICATION  *UNUSUAL SHORTNESS OF BREATH  *UNUSUAL BRUISING OR BLEEDING  TENDERNESS IN MOUTH AND THROAT WITH OR WITHOUT PRESENCE OF ULCERS  *URINARY PROBLEMS  *BOWEL PROBLEMS  UNUSUAL RASH Items with * indicate a potential emergency and should be followed up as soon as possible.  Feel free to call the clinic should you have any questions or concerns. The clinic phone number is [phone].  Please show the [facility] at check-in to the Emergency Department and triage nurse.  Irinotecan injection What is this medicine? IRINOTECAN (ir in oh TEE kan ) is a chemotherapy drug. It is used to treat colon and rectal cancer. This medicine may be used for other purposes; ask your health care provider or pharmacist if you have questions. COMMON BRAND NAME(S): Camptosar What should I tell my health care provider before I take this medicine? They need to know if you have any of these conditions: -dehydration -diarrhea -infection (especially a virus infection such as chickenpox, cold sores, or herpes) -liver disease -low blood counts, like low white cell, platelet, or red cell counts -low levels of calcium, magnesium, or potassium in the blood -recent or ongoing radiation therapy -an unusual or allergic reaction to irinotecan, other medicines, foods, dyes, or preservatives -pregnant or  trying to get pregnant -breast-feeding How should I use this medicine? This drug is given as an infusion into a vein. It is administered in a hospital or clinic by a specially trained health care professional. Talk to your pediatrician regarding the use of this medicine in children. Special care may be needed. Overdosage: If you think you have taken too much of this medicine contact a poison control center or emergency room at once. NOTE: This medicine is only for you. Do not share this medicine with others. What if I miss a dose? It is important not to miss your dose. Call your doctor or health care professional if you are unable to keep an appointment. What may interact with this medicine? This medicine may interact with the following medications: -antiviral medicines for HIV or AIDS -certain antibiotics like rifampin or rifabutin -certain medicines for fungal infections like itraconazole, ketoconazole, posaconazole, and voriconazole -certain medicines for seizures like carbamazepine, phenobarbital, phenotoin -clarithromycin -gemfibrozil -nefazodone -St. John's Wort This list may not describe all possible interactions. Give your health care provider a list of all the medicines, herbs, non-prescription drugs, or dietary supplements you use. Also tell them if you smoke, drink alcohol, or use illegal drugs. Some items may interact with your medicine. What should I watch for while using this medicine? Your condition will be monitored carefully while you are receiving this medicine. You will need important blood work done while you are taking this medicine. This drug may make you feel generally unwell. This is not uncommon, as chemotherapy can affect healthy cells as well as cancer cells. Report any side effects. Continue your course of treatment even though you feel ill unless  your doctor tells you to stop. In some cases, you may be given additional medicines to help with side effects. Follow  all directions for their use. You may get drowsy or dizzy. Do not drive, use machinery, or do anything that needs mental alertness until you know how this medicine affects you. Do not stand or sit up quickly, especially if you are an older patient. This reduces the risk of dizzy or fainting spells. Call your doctor or health care professional for advice if you get a fever, chills or sore throat, or other symptoms of a cold or flu. Do not treat yourself. This drug decreases your body's ability to fight infections. Try to avoid being around people who are sick. This medicine may increase your risk to bruise or bleed. Call your doctor or health care professional if you notice any unusual bleeding. Be careful brushing and flossing your teeth or using a toothpick because you may get an infection or bleed more easily. If you have any dental work done, tell your dentist you are receiving this medicine. Avoid taking products that contain aspirin, acetaminophen, ibuprofen, naproxen, or ketoprofen unless instructed by your doctor. These medicines may hide a fever. Do not become pregnant while taking this medicine. Women should inform their doctor if they wish to become pregnant or think they might be pregnant. There is a potential for serious side effects to an unborn child. Talk to your health care professional or pharmacist for more information. Do not breast-feed an infant while taking this medicine. What side effects may I notice from receiving this medicine? Side effects that you should report to your doctor or health care professional as soon as possible: -allergic reactions like skin rash, itching or hives, swelling of the face, lips, or tongue -chest pain -diarrhea -flushing, runny nose, sweating during infusion -low blood counts - this medicine may decrease the number of white blood cells, red blood cells and platelets. You may be at increased risk for infections and bleeding. -nausea, vomiting -pain,  swelling, warmth in the leg -signs of decreased platelets or bleeding - bruising, pinpoint red spots on the skin, black, tarry stools, blood in the urine -signs of infection - fever or chills, cough, sore throat, pain or difficulty passing urine -signs of decreased red blood cells - unusually weak or tired, fainting spells, lightheadedness Side effects that usually do not require medical attention (report to your doctor or health care professional if they continue or are bothersome): -constipation -hair loss -headache -loss of appetite -mouth sores -stomach pain This list may not describe all possible side effects. Call your doctor for medical advice about side effects. You may report side effects to FDA at 1-800-FDA-1088. Where should I keep my medicine? This drug is given in a hospital or clinic and will not be stored at home. NOTE: This sheet is a summary. It may not cover all possible information. If you have questions about this medicine, talk to your doctor, pharmacist, or health care provider.  2019 Elsevier/Gold Standard (2017-09-13 15:02:58)  Oxaliplatin Injection What is this medicine? OXALIPLATIN (ox AL i PLA tin) is a chemotherapy drug. It targets fast dividing cells, like cancer cells, and causes these cells to die. This medicine is used to treat cancers of the colon and rectum, and many other cancers. This medicine may be used for other purposes; ask your health care provider or pharmacist if you have questions. COMMON BRAND NAME(S): Eloxatin What should I tell my health care provider before I take  this medicine? They need to know if you have any of these conditions: -kidney disease -an unusual or allergic reaction to oxaliplatin, other chemotherapy, other medicines, foods, dyes, or preservatives -pregnant or trying to get pregnant -breast-feeding How should I use this medicine? This drug is given as an infusion into a vein. It is administered in a hospital or clinic by a  specially trained health care professional. Talk to your pediatrician regarding the use of this medicine in children. Special care may be needed. Overdosage: If you think you have taken too much of this medicine contact a poison control center or emergency room at once. NOTE: This medicine is only for you. Do not share this medicine with others. What if I miss a dose? It is important not to miss a dose. Call your doctor or health care professional if you are unable to keep an appointment. What may interact with this medicine? -medicines to increase blood counts like filgrastim, pegfilgrastim, sargramostim -probenecid -some antibiotics like amikacin, gentamicin, neomycin, polymyxin B, streptomycin, tobramycin -zalcitabine Talk to your doctor or health care professional before taking any of these medicines: -acetaminophen -aspirin -ibuprofen -ketoprofen -naproxen This list may not describe all possible interactions. Give your health care provider a list of all the medicines, herbs, non-prescription drugs, or dietary supplements you use. Also tell them if you smoke, drink alcohol, or use illegal drugs. Some items may interact with your medicine. What should I watch for while using this medicine? Your condition will be monitored carefully while you are receiving this medicine. You will need important blood work done while you are taking this medicine. This medicine can make you more sensitive to cold. Do not drink cold drinks or use ice. Cover exposed skin before coming in contact with cold temperatures or cold objects. When out in cold weather wear warm clothing and cover your mouth and nose to warm the air that goes into your lungs. Tell your doctor if you get sensitive to the cold. This drug may make you feel generally unwell. This is not uncommon, as chemotherapy can affect healthy cells as well as cancer cells. Report any side effects. Continue your course of treatment even though you feel ill  unless your doctor tells you to stop. In some cases, you may be given additional medicines to help with side effects. Follow all directions for their use. Call your doctor or health care professional for advice if you get a fever, chills or sore throat, or other symptoms of a cold or flu. Do not treat yourself. This drug decreases your body's ability to fight infections. Try to avoid being around people who are sick. This medicine may increase your risk to bruise or bleed. Call your doctor or health care professional if you notice any unusual bleeding. Be careful brushing and flossing your teeth or using a toothpick because you may get an infection or bleed more easily. If you have any dental work done, tell your dentist you are receiving this medicine. Avoid taking products that contain aspirin, acetaminophen, ibuprofen, naproxen, or ketoprofen unless instructed by your doctor. These medicines may hide a fever. Do not become pregnant while taking this medicine. Women should inform their doctor if they wish to become pregnant or think they might be pregnant. There is a potential for serious side effects to an unborn child. Talk to your health care professional or pharmacist for more information. Do not breast-feed an infant while taking this medicine. Call your doctor or health care professional if you  get diarrhea. Do not treat yourself. What side effects may I notice from receiving this medicine? Side effects that you should report to your doctor or health care professional as soon as possible: -allergic reactions like skin rash, itching or hives, swelling of the face, lips, or tongue -low blood counts - This drug may decrease the number of white blood cells, red blood cells and platelets. You may be at increased risk for infections and bleeding. -signs of infection - fever or chills, cough, sore throat, pain or difficulty passing urine -signs of decreased platelets or bleeding - bruising, pinpoint  red spots on the skin, black, tarry stools, nosebleeds -signs of decreased red blood cells - unusually weak or tired, fainting spells, lightheadedness -breathing problems -chest pain, pressure -cough -diarrhea -jaw tightness -mouth sores -nausea and vomiting -pain, swelling, redness or irritation at the injection site -pain, tingling, numbness in the hands or feet -problems with balance, talking, walking -redness, blistering, peeling or loosening of the skin, including inside the mouth -trouble passing urine or change in the amount of urine Side effects that usually do not require medical attention (report to your doctor or health care professional if they continue or are bothersome): -changes in vision -constipation -hair loss -loss of appetite -metallic taste in the mouth or changes in taste -stomach pain This list may not describe all possible side effects. Call your doctor for medical advice about side effects. You may report side effects to FDA at 1-800-FDA-1088. Where should I keep my medicine? This drug is given in a hospital or clinic and will not be stored at home. NOTE: This sheet is a summary. It may not cover all possible information. If you have questions about this medicine, talk to your doctor, pharmacist, or health care provider.  2019 Elsevier/Gold Standard (2008-01-23 17:22:47)  Fluorouracil, 5-FU injection What is this medicine? FLUOROURACIL, 5-FU (flure oh YOOR a sil) is a chemotherapy drug. It slows the growth of cancer cells. This medicine is used to treat many types of cancer like breast cancer, colon or rectal cancer, pancreatic cancer, and stomach cancer. This medicine may be used for other purposes; ask your health care provider or pharmacist if you have questions. COMMON BRAND NAME(S): Adrucil What should I tell my health care provider before I take this medicine? They need to know if you have any of these conditions: -blood disorders -dihydropyrimidine  dehydrogenase (DPD) deficiency -infection (especially a virus infection such as chickenpox, cold sores, or herpes) -kidney disease -liver disease -malnourished, poor nutrition -recent or ongoing radiation therapy -an unusual or allergic reaction to fluorouracil, other chemotherapy, other medicines, foods, dyes, or preservatives -pregnant or trying to get pregnant -breast-feeding How should I use this medicine? This drug is given as an infusion or injection into a vein. It is administered in a hospital or clinic by a specially trained health care professional. Talk to your pediatrician regarding the use of this medicine in children. Special care may be needed. Overdosage: If you think you have taken too much of this medicine contact a poison control center or emergency room at once. NOTE: This medicine is only for you. Do not share this medicine with others. What if I miss a dose? It is important not to miss your dose. Call your doctor or health care professional if you are unable to keep an appointment. What may interact with this medicine? -allopurinol -cimetidine -dapsone -digoxin -hydroxyurea -leucovorin -levamisole -medicines for seizures like ethotoin, fosphenytoin, phenytoin -medicines to increase blood counts like filgrastim,  pegfilgrastim, sargramostim -medicines that treat or prevent blood clots like warfarin, enoxaparin, and dalteparin -methotrexate -metronidazole -pyrimethamine -some other chemotherapy drugs like busulfan, cisplatin, estramustine, vinblastine -trimethoprim -trimetrexate -vaccines Talk to your doctor or health care professional before taking any of these medicines: -acetaminophen -aspirin -ibuprofen -ketoprofen -naproxen This list may not describe all possible interactions. Give your health care provider a list of all the medicines, herbs, non-prescription drugs, or dietary supplements you use. Also tell them if you smoke, drink alcohol, or use  illegal drugs. Some items may interact with your medicine. What should I watch for while using this medicine? Visit your doctor for checks on your progress. This drug may make you feel generally unwell. This is not uncommon, as chemotherapy can affect healthy cells as well as cancer cells. Report any side effects. Continue your course of treatment even though you feel ill unless your doctor tells you to stop. In some cases, you may be given additional medicines to help with side effects. Follow all directions for their use. Call your doctor or health care professional for advice if you get a fever, chills or sore throat, or other symptoms of a cold or flu. Do not treat yourself. This drug decreases your body's ability to fight infections. Try to avoid being around people who are sick. This medicine may increase your risk to bruise or bleed. Call your doctor or health care professional if you notice any unusual bleeding. Be careful brushing and flossing your teeth or using a toothpick because you may get an infection or bleed more easily. If you have any dental work done, tell your dentist you are receiving this medicine. Avoid taking products that contain aspirin, acetaminophen, ibuprofen, naproxen, or ketoprofen unless instructed by your doctor. These medicines may hide a fever. Do not become pregnant while taking this medicine. Women should inform their doctor if they wish to become pregnant or think they might be pregnant. There is a potential for serious side effects to an unborn child. Talk to your health care professional or pharmacist for more information. Do not breast-feed an infant while taking this medicine. Men should inform their doctor if they wish to father a child. This medicine may lower sperm counts. Do not treat diarrhea with over the counter products. Contact your doctor if you have diarrhea that lasts more than 2 days or if it is severe and watery. This medicine can make you more  sensitive to the sun. Keep out of the sun. If you cannot avoid being in the sun, wear protective clothing and use sunscreen. Do not use sun lamps or tanning beds/booths. What side effects may I notice from receiving this medicine? Side effects that you should report to your doctor or health care professional as soon as possible: -allergic reactions like skin rash, itching or hives, swelling of the face, lips, or tongue -low blood counts - this medicine may decrease the number of white blood cells, red blood cells and platelets. You may be at increased risk for infections and bleeding. -signs of infection - fever or chills, cough, sore throat, pain or difficulty passing urine -signs of decreased platelets or bleeding - bruising, pinpoint red spots on the skin, black, tarry stools, blood in the urine -signs of decreased red blood cells - unusually weak or tired, fainting spells, lightheadedness -breathing problems -changes in vision -chest pain -mouth sores -nausea and vomiting -pain, swelling, redness at site where injected -pain, tingling, numbness in the hands or feet -redness, swelling, or sores on  hands or feet -stomach pain -unusual bleeding Side effects that usually do not require medical attention (report to your doctor or health care professional if they continue or are bothersome): -changes in finger or toe nails -diarrhea -dry or itchy skin -hair loss -headache -loss of appetite -sensitivity of eyes to the light -stomach upset -unusually teary eyes This list may not describe all possible side effects. Call your doctor for medical advice about side effects. You may report side effects to FDA at 1-800-FDA-1088. Where should I keep my medicine? This drug is given in a hospital or clinic and will not be stored at home. NOTE: This sheet is a summary. It may not cover all possible information. If you have questions about this medicine, talk to your doctor, pharmacist, or health care  provider.  2019 Elsevier/Gold Standard (2007-11-01 13:53:16)

## 2018-11-29 NOTE — Telephone Encounter (Signed)
Scheduled appt per 5/20 los.  Added treatment to the book for approval.  Will call patient once treatment has been added.

## 2018-12-01 ENCOUNTER — Other Ambulatory Visit: Payer: Self-pay

## 2018-12-01 ENCOUNTER — Inpatient Hospital Stay: Payer: 59

## 2018-12-01 ENCOUNTER — Telehealth: Payer: Self-pay | Admitting: *Deleted

## 2018-12-01 VITALS — BP 128/90 | HR 67 | Temp 100.0°F | Resp 18

## 2018-12-01 DIAGNOSIS — C20 Malignant neoplasm of rectum: Secondary | ICD-10-CM

## 2018-12-01 DIAGNOSIS — Z5111 Encounter for antineoplastic chemotherapy: Secondary | ICD-10-CM | POA: Diagnosis not present

## 2018-12-01 MED ORDER — SODIUM CHLORIDE 0.9% FLUSH
10.0000 mL | INTRAVENOUS | Status: DC | PRN
  Administered 2018-12-01: 10 mL
  Filled 2018-12-01: qty 10

## 2018-12-01 MED ORDER — HEPARIN SOD (PORK) LOCK FLUSH 100 UNIT/ML IV SOLN
500.0000 [IU] | Freq: Once | INTRAVENOUS | Status: AC | PRN
  Administered 2018-12-01: 16:00:00 500 [IU]
  Filled 2018-12-01: qty 5

## 2018-12-01 NOTE — Telephone Encounter (Signed)
Called to report constipation. No BM since 11/28/18. Reports she was given atropine with her treatment and asking if this could have caused it? Confirmed that most likely, this is the cause of her constipation. She took Dulcolax yesterday as well as taking #3 today; also took MiraLax twice. Plans to take mag citrate when he returns from pump d/c if that is OK per MD. OK per Dr. Benay Spice: Take 1/2 bottle and repeat in 4 hours if no BM. Call office if this does not produce a BM. Left this information on a note and gave to flush nurse to give patient.

## 2018-12-10 ENCOUNTER — Other Ambulatory Visit: Payer: Self-pay | Admitting: Oncology

## 2018-12-13 ENCOUNTER — Other Ambulatory Visit: Payer: Self-pay | Admitting: *Deleted

## 2018-12-13 DIAGNOSIS — C20 Malignant neoplasm of rectum: Secondary | ICD-10-CM

## 2018-12-14 ENCOUNTER — Other Ambulatory Visit: Payer: Self-pay

## 2018-12-14 ENCOUNTER — Inpatient Hospital Stay: Payer: BC Managed Care – PPO

## 2018-12-14 ENCOUNTER — Inpatient Hospital Stay (HOSPITAL_BASED_OUTPATIENT_CLINIC_OR_DEPARTMENT_OTHER): Payer: BC Managed Care – PPO | Admitting: Nurse Practitioner

## 2018-12-14 ENCOUNTER — Encounter: Payer: Self-pay | Admitting: Nurse Practitioner

## 2018-12-14 ENCOUNTER — Inpatient Hospital Stay: Payer: BC Managed Care – PPO | Attending: Oncology

## 2018-12-14 VITALS — HR 92

## 2018-12-14 VITALS — BP 111/88 | HR 102 | Temp 98.3°F | Resp 18 | Ht 67.0 in | Wt 121.3 lb

## 2018-12-14 DIAGNOSIS — R918 Other nonspecific abnormal finding of lung field: Secondary | ICD-10-CM | POA: Diagnosis not present

## 2018-12-14 DIAGNOSIS — C20 Malignant neoplasm of rectum: Secondary | ICD-10-CM

## 2018-12-14 DIAGNOSIS — C7982 Secondary malignant neoplasm of genital organs: Secondary | ICD-10-CM | POA: Diagnosis present

## 2018-12-14 DIAGNOSIS — Z5111 Encounter for antineoplastic chemotherapy: Secondary | ICD-10-CM | POA: Diagnosis not present

## 2018-12-14 DIAGNOSIS — Z95828 Presence of other vascular implants and grafts: Secondary | ICD-10-CM

## 2018-12-14 DIAGNOSIS — R110 Nausea: Secondary | ICD-10-CM

## 2018-12-14 DIAGNOSIS — Z79899 Other long term (current) drug therapy: Secondary | ICD-10-CM

## 2018-12-14 DIAGNOSIS — Z87891 Personal history of nicotine dependence: Secondary | ICD-10-CM

## 2018-12-14 DIAGNOSIS — E876 Hypokalemia: Secondary | ICD-10-CM | POA: Diagnosis not present

## 2018-12-14 LAB — CBC WITH DIFFERENTIAL (CANCER CENTER ONLY)
Abs Immature Granulocytes: 0.02 10*3/uL (ref 0.00–0.07)
Basophils Absolute: 0 10*3/uL (ref 0.0–0.1)
Basophils Relative: 1 %
Eosinophils Absolute: 0.1 10*3/uL (ref 0.0–0.5)
Eosinophils Relative: 2 %
HCT: 32 % — ABNORMAL LOW (ref 36.0–46.0)
Hemoglobin: 9.8 g/dL — ABNORMAL LOW (ref 12.0–15.0)
Immature Granulocytes: 1 %
Lymphocytes Relative: 24 %
Lymphs Abs: 1 10*3/uL (ref 0.7–4.0)
MCH: 19 pg — ABNORMAL LOW (ref 26.0–34.0)
MCHC: 30.6 g/dL (ref 30.0–36.0)
MCV: 62.1 fL — ABNORMAL LOW (ref 80.0–100.0)
Monocytes Absolute: 0.5 10*3/uL (ref 0.1–1.0)
Monocytes Relative: 13 %
Neutro Abs: 2.5 10*3/uL (ref 1.7–7.7)
Neutrophils Relative %: 59 %
Platelet Count: 254 10*3/uL (ref 150–400)
RBC: 5.15 MIL/uL — ABNORMAL HIGH (ref 3.87–5.11)
RDW: 17 % — ABNORMAL HIGH (ref 11.5–15.5)
WBC Count: 4.2 10*3/uL (ref 4.0–10.5)
nRBC: 0 % (ref 0.0–0.2)

## 2018-12-14 LAB — CMP (CANCER CENTER ONLY)
ALT: 11 U/L (ref 0–44)
AST: 16 U/L (ref 15–41)
Albumin: 3 g/dL — ABNORMAL LOW (ref 3.5–5.0)
Alkaline Phosphatase: 98 U/L (ref 38–126)
Anion gap: 11 (ref 5–15)
BUN: 6 mg/dL (ref 6–20)
CO2: 26 mmol/L (ref 22–32)
Calcium: 8.7 mg/dL — ABNORMAL LOW (ref 8.9–10.3)
Chloride: 98 mmol/L (ref 98–111)
Creatinine: 0.58 mg/dL (ref 0.44–1.00)
GFR, Est AFR Am: >60 mL/min (ref >60)
GFR, Estimated: >60 mL/min (ref >60)
Glucose, Bld: 103 mg/dL — ABNORMAL HIGH (ref 70–99)
Potassium: 3.2 mmol/L — ABNORMAL LOW (ref 3.5–5.1)
Sodium: 135 mmol/L (ref 135–145)
Total Bilirubin: 0.2 mg/dL — ABNORMAL LOW (ref 0.3–1.2)
Total Protein: 6.3 g/dL — ABNORMAL LOW (ref 6.5–8.1)

## 2018-12-14 MED ORDER — SODIUM CHLORIDE 0.9% FLUSH
10.0000 mL | Freq: Once | INTRAVENOUS | Status: AC
  Administered 2018-12-14: 10 mL
  Filled 2018-12-14: qty 10

## 2018-12-14 MED ORDER — POTASSIUM CHLORIDE CRYS ER 20 MEQ PO TBCR: 20.0000 meq | EXTENDED_RELEASE_TABLET | Freq: Every day | ORAL | 1 refills | Status: DC

## 2018-12-14 MED ORDER — LEUCOVORIN CALCIUM INJECTION 350 MG
200.0000 mg/m2 | Freq: Once | INTRAVENOUS | Status: AC
  Administered 2018-12-14: 13:00:00 318 mg via INTRAVENOUS
  Filled 2018-12-14: qty 15.9

## 2018-12-14 MED ORDER — ATROPINE SULFATE 1 MG/ML IJ SOLN: 0.5000 mg | Freq: Once | INTRAMUSCULAR | Status: DC | PRN

## 2018-12-14 MED ORDER — OXALIPLATIN CHEMO INJECTION 100 MG/20ML
85.0000 mg/m2 | Freq: Once | INTRAVENOUS | Status: AC
  Administered 2018-12-14: 13:00:00 135 mg via INTRAVENOUS
  Filled 2018-12-14: qty 10

## 2018-12-14 MED ORDER — SODIUM CHLORIDE 0.9 % IV SOLN
Freq: Once | INTRAVENOUS | Status: AC
  Administered 2018-12-14: 11:00:00 via INTRAVENOUS
  Filled 2018-12-14: qty 5

## 2018-12-14 MED ORDER — DEXAMETHASONE 4 MG PO TABS: ORAL_TABLET | ORAL | 3 refills | Status: DC

## 2018-12-14 MED ORDER — PALONOSETRON HCL INJECTION 0.25 MG/5ML
0.2500 mg | Freq: Once | INTRAVENOUS | Status: AC
  Administered 2018-12-14: 10:00:00 0.25 mg via INTRAVENOUS

## 2018-12-14 MED ORDER — PALONOSETRON HCL INJECTION 0.25 MG/5ML
INTRAVENOUS | Status: AC
  Filled 2018-12-14: qty 5

## 2018-12-14 MED ORDER — DEXTROSE 5 % IV SOLN
Freq: Once | INTRAVENOUS | Status: AC
  Administered 2018-12-14: 13:00:00 via INTRAVENOUS
  Filled 2018-12-14: qty 250

## 2018-12-14 MED ORDER — DEXTROSE 5 % IV SOLN
Freq: Once | INTRAVENOUS | Status: AC
  Administered 2018-12-14: 10:00:00 via INTRAVENOUS
  Filled 2018-12-14: qty 250

## 2018-12-14 MED ORDER — IRINOTECAN HCL CHEMO INJECTION 100 MG/5ML
150.0000 mg/m2 | Freq: Once | INTRAVENOUS | Status: AC
  Administered 2018-12-14: 240 mg via INTRAVENOUS
  Filled 2018-12-14: qty 12

## 2018-12-14 MED ORDER — SODIUM CHLORIDE 0.9 % IV SOLN
3150.0000 mg/m2 | INTRAVENOUS | Status: DC
  Administered 2018-12-14: 15:00:00 5000 mg via INTRAVENOUS
  Filled 2018-12-14: qty 100

## 2018-12-14 NOTE — Patient Instructions (Signed)
Coronavirus (COVID-19) Are you at risk?  Are you at risk for the Coronavirus (COVID-19)?  To be considered HIGH RISK for Coronavirus (COVID-19), you have to meet the following criteria:  . Traveled to [place], [place], [place], [place] or [place]; or in the [place] to [place], [place], [place], or [place]; and have fever, cough, and shortness of breath within the last 2 weeks of travel OR . Been in close contact with a person diagnosed with COVID-19 within the last 2 weeks and have fever, cough, and shortness of breath . IF YOU DO NOT MEET THESE CRITERIA, YOU ARE CONSIDERED LOW RISK FOR COVID-19.  What to do if you are HIGH RISK for COVID-19?  . If you are having a medical emergency, call 911. . Seek medical care right away. Before you go to a doctor's office, urgent care or emergency department, call ahead and tell them about your recent travel, contact with someone diagnosed with COVID-19, and your symptoms. You should receive instructions from your physician's office regarding next steps of care.  . When you arrive at healthcare provider, tell the healthcare staff immediately you have returned from visiting [place], [place], [place], [place] or [place]; or traveled in the [place] to [place], [place], [place], or [place]; in the last two weeks or you have been in close contact with a person diagnosed with COVID-19 in the last 2 weeks.   . Tell the health care staff about your symptoms: fever, cough and shortness of breath. . After you have been seen by a medical provider, you will be either: o Tested for (COVID-19) and discharged home on quarantine except to seek medical care if symptoms worsen, and asked to  - Stay home and avoid contact with others until you get your results (4-5 days)  - Avoid travel on public transportation if possible (such as bus, train, or airplane) or o Sent to the Emergency Department by EMS for evaluation, COVID-19 testing, and possible  admission depending on your condition and test results.  What to do if you are LOW RISK for COVID-19?  Reduce your risk of any infection by using the same precautions used for avoiding the common cold or flu:  . Wash your hands often with soap and warm water for at least 20 seconds.  If soap and water are not readily available, use an alcohol-based hand sanitizer with at least 60% alcohol.  . If coughing or sneezing, cover your mouth and nose by coughing or sneezing into the elbow areas of your shirt or coat, into a tissue or into your sleeve (not your hands). . Avoid shaking hands with others and consider head nods or verbal greetings only. . Avoid touching your eyes, nose, or mouth with unwashed hands.  . Avoid close contact with people who are sick. . Avoid places or events with large numbers of people in one location, like concerts or sporting events. . Carefully consider travel plans you have or are making. . If you are planning any travel outside or inside the [place], visit the CDC's Travelers' Health webpage for the latest health notices. . If you have some symptoms but not all symptoms, continue to monitor at home and seek medical attention if your symptoms worsen. . If you are having a medical emergency, call 911.   ADDITIONAL HEALTHCARE OPTIONS FOR PATIENTS  [facility] / e-Visit: [email or web address]         [facility]: [phone]  Onset   Urgent Care: [facility]: [place] Discharge Instructions for Patients Receiving Chemotherapy  Today you received the following chemotherapy agents Irinotecan, Oxaliplatin, Leucovorin and Adrucil   To help prevent nausea and vomiting after your treatment, we encourage you to take your nausea medication as directed.    If you develop nausea and vomiting that is not controlled by your nausea  medication, call the clinic.   BELOW ARE SYMPTOMS THAT SHOULD BE REPORTED IMMEDIATELY:  *FEVER GREATER THAN 100.5 F  *CHILLS WITH OR WITHOUT FEVER  NAUSEA AND VOMITING THAT IS NOT CONTROLLED WITH YOUR NAUSEA MEDICATION  *UNUSUAL SHORTNESS OF BREATH  *UNUSUAL BRUISING OR BLEEDING  TENDERNESS IN MOUTH AND THROAT WITH OR WITHOUT PRESENCE OF ULCERS  *URINARY PROBLEMS  *BOWEL PROBLEMS  UNUSUAL RASH Items with * indicate a potential emergency and should be followed up as soon as possible.  Feel free to call the clinic should you have any questions or concerns. The clinic phone number is [phone].  Please show the [facility] at check-in to the Emergency Department and triage nurse.

## 2018-12-14 NOTE — Progress Notes (Signed)
[facility] OFFICE PROGRESS NOTE   Diagnosis: Rectal cancer  INTERVAL HISTORY:   Tina Morales returns as scheduled.  She completed cycle 1 FOLFOXIRI 11/29/2018.  She had nausea beginning day 2 and lasting 10 to 11 days.  No vomiting.  Compazine was partially effective.  No mouth sores.  She developed significant constipation which she feels was secondary to atropine.  She does not want to receive atropine this time.  She noted fatigue and a decreased appetite when she was nauseated.  Cold sensitivity improved over the past 4 days.  Baseline neuropathy symptoms involving the feet unchanged.  She reports a history of migraine headaches.  She had daily headaches following chemotherapy and lasting until a few days ago.  No associated symptoms.  Specifically no visual disturbance.  Objective:  Vital signs in last 24 hours:  Blood pressure 111/88, pulse (!) 102, temperature 98.3 F (36.8 C), temperature source Oral, resp. rate 18, height 5' 7"  (1.702 m), weight 121 lb 4.8 oz (55 kg), SpO2 98 %.    HEENT: Mild white coating over tongue.  No ulcers. Vascular: No leg edema. Neuro: Alert and oriented.  Motor strength 5/5.  Knee DTRs 2+, symmetric.  Finger-to-nose intact. Port-A-Cath without erythema.   Lab Results:  Lab Results  Component Value Date   WBC 4.2 12/14/2018   HGB 9.8 (L) 12/14/2018   HCT 32.0 (L) 12/14/2018   MCV 62.1 (L) 12/14/2018   PLT 254 12/14/2018   NEUTROABS 2.5 12/14/2018    Imaging:  No results found.  Medications: I have reviewed the patient's current medications.  Assessment/Plan: 1. Rectal cancer-rectal mass noted on digital exam 02/10/2018, colonoscopy confirmed a him my circumferential mass in the rectum ? 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 CTs 02/10/2018-anterior rectal wall thickening, pulmonary nodules measuring up to 9 mm concerning for metastases, few round perirectal lymph nodes ? Pelvic MRI 02/20/2018-hypermetabolic low rectal mass extending to the posterior vagina with 2 small enlarged perirectal lymph nodes, T4b,N1-2.3 cm from the anal verge ? PET scan 02/23/2018-hypermetabolic rectal mass, 8 mm hypermetabolic lingular nodule, scattered small bilateral lung nodules, some calcified, a few with mildly increased activity ? Cycle 1 FOLFOXIRI8/21/2019 ? Cycle 5 FOLFOXIRI10/16/2019 ? CTs 05/01/2018-significant interval response to therapy with decreased size of the primary rectal mass lesion. Decreasing perirectal lymphadenopathy. Decreased and/or resolved pulmonary nodules. No new sites of disease identified. ? Cycle 6 FOLFOXIRI10/31/2019 ? Radiation/Xeloda 06/12/2018-completed 07/21/2018 ? Xeloda dose reduced 07/10/2018 due to mucositis and diarrhea ? CTs 08/07/2018- compared to 02/10/2018- resolved and decreased pulmonary nodules, few tiny residual noncalcified nodules, decreased 20 perirectal lymph nodes, soft tissue of the rectum indistinguishable from posterior wall of vagina- Vaginal involvement by tumor? ? 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 CT chest 09/29/2018- enlargement of lung nodules, not a candidate for SBRT based on discussion in GI tumor conference and with radiation oncology ? CT chest 11/27/18 - interval growth of numerous pulmonary metastases bilaterally compared to 09/29/18, no new pulmonary mets  ? Cycle 1 FOLFOXIRI 11/29/18  ? 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 Imodium as needed. 6.Mucositis secondary to Xeloda. Improved 07/19/2018. 7.   History of migraines, has experienced weekly migraines for past 3 weeks (11/29/18)    Disposition: Tina Morales appears stable.  She has completed 1 cycle of FOLFOXIRI.  Plan to proceed with cycle 2 today as scheduled.    CBC and chemistry panel results reviewed, adequate for treatment.  She has mild hypokalemia.  She will begin K-Dur 20 milliequivalents daily.  She had significant delayed nausea.  We adjusted the home dexamethasone dose.  She understands to contact the office if she has poorly controlled nausea after cycle 2.  She will return for lab, follow-up, cycle 3 FOLFOXIRI in 2 weeks.  She will contact the office in the interim with any problems.  Plan reviewed with Dr. Benay Spice.  25 minutes were spent face-to-face at today's visit with the majority of that time involved in counseling/coordination of care.    Ned Card ANP/GNP-BC   12/14/2018  9:11 AM

## 2018-12-14 NOTE — Progress Notes (Signed)
No atropine today per Ned Card, NP note: "She developed significant constipation which she feels was secondary to atropine.  She does not want to receive atropine this time." Doristine Section, RN aware. Kennith Center, Pharm.D., CPP 12/14/2018@10 :26 AM

## 2018-12-15 ENCOUNTER — Telehealth: Payer: Self-pay | Admitting: Nurse Practitioner

## 2018-12-15 NOTE — Telephone Encounter (Signed)
Scheduled appt per 6/4 los. 

## 2018-12-16 ENCOUNTER — Other Ambulatory Visit: Payer: Self-pay

## 2018-12-16 ENCOUNTER — Inpatient Hospital Stay: Payer: BC Managed Care – PPO

## 2018-12-16 VITALS — BP 111/78 | HR 71 | Temp 97.5°F | Resp 18

## 2018-12-16 DIAGNOSIS — C20 Malignant neoplasm of rectum: Secondary | ICD-10-CM

## 2018-12-16 DIAGNOSIS — Z5111 Encounter for antineoplastic chemotherapy: Secondary | ICD-10-CM | POA: Diagnosis not present

## 2018-12-16 MED ORDER — SODIUM CHLORIDE 0.9% FLUSH
10.0000 mL | INTRAVENOUS | Status: DC | PRN
  Administered 2018-12-16: 13:00:00 10 mL
  Filled 2018-12-16: qty 10

## 2018-12-16 MED ORDER — HEPARIN SOD (PORK) LOCK FLUSH 100 UNIT/ML IV SOLN
500.0000 [IU] | Freq: Once | INTRAVENOUS | Status: AC | PRN
  Administered 2018-12-16: 500 [IU]
  Filled 2018-12-16: qty 5

## 2018-12-16 NOTE — Patient Instructions (Signed)

## 2018-12-24 ENCOUNTER — Other Ambulatory Visit: Payer: Self-pay | Admitting: Oncology

## 2018-12-27 ENCOUNTER — Other Ambulatory Visit: Payer: Self-pay

## 2018-12-27 ENCOUNTER — Telehealth: Payer: Self-pay | Admitting: *Deleted

## 2018-12-27 DIAGNOSIS — C20 Malignant neoplasm of rectum: Secondary | ICD-10-CM

## 2018-12-27 NOTE — Telephone Encounter (Signed)
Notified patient that her insurance still has not authorized her chemo from 12/14/18 or the one due tomorrow. Wanted her aware so she can make decision if she wants to proceed tomorrow or wait? She still thinks she wants to proceed. Will contact managed care in the morning to see if it has been approved yet.

## 2018-12-28 ENCOUNTER — Inpatient Hospital Stay (HOSPITAL_BASED_OUTPATIENT_CLINIC_OR_DEPARTMENT_OTHER): Payer: BC Managed Care – PPO | Admitting: Nurse Practitioner

## 2018-12-28 ENCOUNTER — Other Ambulatory Visit: Payer: Self-pay

## 2018-12-28 ENCOUNTER — Inpatient Hospital Stay: Payer: BC Managed Care – PPO

## 2018-12-28 ENCOUNTER — Encounter: Payer: Self-pay | Admitting: Nurse Practitioner

## 2018-12-28 VITALS — BP 117/82 | HR 100 | Temp 98.4°F | Resp 18 | Ht 67.0 in | Wt 120.7 lb

## 2018-12-28 DIAGNOSIS — C20 Malignant neoplasm of rectum: Secondary | ICD-10-CM | POA: Diagnosis not present

## 2018-12-28 DIAGNOSIS — C7802 Secondary malignant neoplasm of left lung: Secondary | ICD-10-CM | POA: Diagnosis not present

## 2018-12-28 DIAGNOSIS — C7801 Secondary malignant neoplasm of right lung: Secondary | ICD-10-CM | POA: Diagnosis not present

## 2018-12-28 DIAGNOSIS — R110 Nausea: Secondary | ICD-10-CM

## 2018-12-28 DIAGNOSIS — Z5111 Encounter for antineoplastic chemotherapy: Secondary | ICD-10-CM | POA: Diagnosis not present

## 2018-12-28 DIAGNOSIS — Z87891 Personal history of nicotine dependence: Secondary | ICD-10-CM

## 2018-12-28 DIAGNOSIS — Z95828 Presence of other vascular implants and grafts: Secondary | ICD-10-CM

## 2018-12-28 LAB — CBC WITH DIFFERENTIAL (CANCER CENTER ONLY)
Abs Immature Granulocytes: 0.03 10*3/uL (ref 0.00–0.07)
Basophils Absolute: 0 10*3/uL (ref 0.0–0.1)
Basophils Relative: 1 %
Eosinophils Absolute: 0 10*3/uL (ref 0.0–0.5)
Eosinophils Relative: 1 %
HCT: 31 % — ABNORMAL LOW (ref 36.0–46.0)
Hemoglobin: 9.5 g/dL — ABNORMAL LOW (ref 12.0–15.0)
Immature Granulocytes: 1 %
Lymphocytes Relative: 25 %
Lymphs Abs: 0.9 10*3/uL (ref 0.7–4.0)
MCH: 18.8 pg — ABNORMAL LOW (ref 26.0–34.0)
MCHC: 30.6 g/dL (ref 30.0–36.0)
MCV: 61.5 fL — ABNORMAL LOW (ref 80.0–100.0)
Monocytes Absolute: 0.6 10*3/uL (ref 0.1–1.0)
Monocytes Relative: 19 %
Neutro Abs: 1.9 10*3/uL (ref 1.7–7.7)
Neutrophils Relative %: 53 %
Platelet Count: 257 10*3/uL (ref 150–400)
RBC: 5.04 MIL/uL (ref 3.87–5.11)
RDW: 18.6 % — ABNORMAL HIGH (ref 11.5–15.5)
WBC Count: 3.4 10*3/uL — ABNORMAL LOW (ref 4.0–10.5)
nRBC: 0 % (ref 0.0–0.2)

## 2018-12-28 LAB — CMP (CANCER CENTER ONLY)
ALT: 14 U/L (ref 0–44)
AST: 19 U/L (ref 15–41)
Albumin: 3 g/dL — ABNORMAL LOW (ref 3.5–5.0)
Alkaline Phosphatase: 114 U/L (ref 38–126)
Anion gap: 10 (ref 5–15)
BUN: 5 mg/dL — ABNORMAL LOW (ref 6–20)
CO2: 27 mmol/L (ref 22–32)
Calcium: 9 mg/dL (ref 8.9–10.3)
Chloride: 101 mmol/L (ref 98–111)
Creatinine: 0.6 mg/dL (ref 0.44–1.00)
GFR, Est AFR Am: >60 mL/min (ref >60)
GFR, Estimated: >60 mL/min (ref >60)
Glucose, Bld: 101 mg/dL — ABNORMAL HIGH (ref 70–99)
Potassium: 3.8 mmol/L (ref 3.5–5.1)
Sodium: 138 mmol/L (ref 135–145)
Total Bilirubin: 0.3 mg/dL (ref 0.3–1.2)
Total Protein: 6.5 g/dL (ref 6.5–8.1)

## 2018-12-28 MED ORDER — LORAZEPAM 0.5 MG PO TABS: 0.5000 mg | ORAL_TABLET | Freq: Three times a day (TID) | ORAL | 0 refills | Status: DC | PRN

## 2018-12-28 MED ORDER — OXALIPLATIN CHEMO INJECTION 100 MG/20ML
85.0000 mg/m2 | Freq: Once | INTRAVENOUS | Status: AC
  Administered 2018-12-28: 135 mg via INTRAVENOUS
  Filled 2018-12-28: qty 20

## 2018-12-28 MED ORDER — DEXTROSE 5 % IV SOLN
Freq: Once | INTRAVENOUS | Status: AC
  Administered 2018-12-28: 10:00:00 via INTRAVENOUS
  Filled 2018-12-28: qty 250

## 2018-12-28 MED ORDER — IRINOTECAN HCL CHEMO INJECTION 100 MG/5ML
150.0000 mg/m2 | Freq: Once | INTRAVENOUS | Status: AC
  Administered 2018-12-28: 240 mg via INTRAVENOUS
  Filled 2018-12-28: qty 12

## 2018-12-28 MED ORDER — PALONOSETRON HCL INJECTION 0.25 MG/5ML
INTRAVENOUS | Status: AC
  Filled 2018-12-28: qty 5

## 2018-12-28 MED ORDER — SODIUM CHLORIDE 0.9 % IV SOLN
Freq: Once | INTRAVENOUS | Status: AC
  Administered 2018-12-28: 11:00:00 via INTRAVENOUS
  Filled 2018-12-28: qty 5

## 2018-12-28 MED ORDER — PALONOSETRON HCL INJECTION 0.25 MG/5ML
0.2500 mg | Freq: Once | INTRAVENOUS | Status: AC
  Administered 2018-12-28: 0.25 mg via INTRAVENOUS

## 2018-12-28 MED ORDER — SODIUM CHLORIDE 0.9% FLUSH
10.0000 mL | Freq: Once | INTRAVENOUS | Status: AC
  Administered 2018-12-28: 09:00:00 10 mL
  Filled 2018-12-28: qty 10

## 2018-12-28 MED ORDER — LEUCOVORIN CALCIUM INJECTION 350 MG
200.0000 mg/m2 | Freq: Once | INTRAVENOUS | Status: AC
  Administered 2018-12-28: 318 mg via INTRAVENOUS
  Filled 2018-12-28: qty 15.9

## 2018-12-28 MED ORDER — SODIUM CHLORIDE 0.9 % IV SOLN
5000.0000 mg | INTRAVENOUS | Status: DC
  Administered 2018-12-28: 5000 mg via INTRAVENOUS
  Filled 2018-12-28: qty 100

## 2018-12-28 NOTE — Patient Instructions (Signed)
[facility] Discharge Instructions for Patients Receiving Chemotherapy  Today you received the following chemotherapy agents Irinotecan (CAMPTOSAR), Oxaliplatin (PARAPLATIN), Leucovorin & Flourouracil (ADRUCIL).  To help prevent nausea and vomiting after your treatment, we encourage you to take your nausea medication as prescribed.   If you develop nausea and vomiting that is not controlled by your nausea medication, call the clinic.   BELOW ARE SYMPTOMS THAT SHOULD BE REPORTED IMMEDIATELY:  *FEVER GREATER THAN 100.5 F  *CHILLS WITH OR WITHOUT FEVER  NAUSEA AND VOMITING THAT IS NOT CONTROLLED WITH YOUR NAUSEA MEDICATION  *UNUSUAL SHORTNESS OF BREATH  *UNUSUAL BRUISING OR BLEEDING  TENDERNESS IN MOUTH AND THROAT WITH OR WITHOUT PRESENCE OF ULCERS  *URINARY PROBLEMS  *BOWEL PROBLEMS  UNUSUAL RASH Items with * indicate a potential emergency and should be followed up as soon as possible.  Feel free to call the clinic should you have any questions or concerns. The clinic phone number is [phone].  Please show the [facility] at check-in to the Emergency Department and triage nurse.  Coronavirus (COVID-19) Are you at risk?  Are you at risk for the Coronavirus (COVID-19)?  To be considered HIGH RISK for Coronavirus (COVID-19), you have to meet the following criteria:  . Traveled to [place], [place], [place], [place] or [place]; or in the [place] to [place], [place], [place], or [place]; and have fever, cough, and shortness of breath within the last 2 weeks of travel OR . Been in close contact with a person diagnosed with COVID-19 within the last 2 weeks and have fever, cough, and shortness of breath . IF YOU DO NOT MEET THESE CRITERIA, YOU ARE CONSIDERED LOW RISK FOR COVID-19.  What to do if you are HIGH RISK for COVID-19?  Marland Kitchen If you are having a medical emergency, call 911. . Seek medical care right away. Before you go to a doctor's office,  urgent care or emergency department, call ahead and tell them about your recent travel, contact with someone diagnosed with COVID-19, and your symptoms. You should receive instructions from your physician's office regarding next steps of care.  . When you arrive at healthcare provider, tell the healthcare staff immediately you have returned from visiting [place], [place], [place], [place] or [place]; or traveled in the [place] to [place], [place], [place], or [place]; in the last two weeks or you have been in close contact with a person diagnosed with COVID-19 in the last 2 weeks.   . Tell the health care staff about your symptoms: fever, cough and shortness of breath. . After you have been seen by a medical provider, you will be either: o Tested for (COVID-19) and discharged home on quarantine except to seek medical care if symptoms worsen, and asked to  - Stay home and avoid contact with others until you get your results (4-5 days)  - Avoid travel on public transportation if possible (such as bus, train, or airplane) or o Sent to the Emergency Department by EMS for evaluation, COVID-19 testing, and possible admission depending on your condition and test results.  What to do if you are LOW RISK for COVID-19?  Reduce your risk of any infection by using the same precautions used for avoiding the common cold or flu:  Marland Kitchen Wash your hands often with soap and warm water for at least 20 seconds.  If soap and water are not readily available, use an alcohol-based hand sanitizer with at least 60%  alcohol.  . If coughing or sneezing, cover your mouth and nose by coughing or sneezing into the elbow areas of your shirt or coat, into a tissue or into your sleeve (not your hands). . Avoid shaking hands with others and consider head nods or verbal greetings only. . Avoid touching your eyes, nose, or mouth with unwashed hands.  . Avoid close contact with people who are sick. . Avoid places or events with  large numbers of people in one location, like concerts or sporting events. . Carefully consider travel plans you have or are making. . If you are planning any travel outside or inside the [place], visit the CDC's Travelers' Health webpage for the latest health notices. . If you have some symptoms but not all symptoms, continue to monitor at home and seek medical attention if your symptoms worsen. . If you are having a medical emergency, call 911.   ADDITIONAL HEALTHCARE OPTIONS FOR PATIENTS  [facility] Tele[facility] / e-Visit: [email or web address]         [facility]: [phone]  [facility]: [phone]                   [facility]: [phone]   

## 2018-12-28 NOTE — Progress Notes (Signed)
[facility] OFFICE PROGRESS NOTE   Diagnosis:  Rectal cancer  INTERVAL HISTORY:   Tina Morales returns as scheduled.  She completed cycle 2 FOLFOXIRI 12/14/2018.  She developed nausea day 1. The nausea was fairly consistent for 4 to 6 days and then occurred intermittently.  She noted improvement with peppermint and Compazine.  She was not aware she could take Zofran after 72 hours.  No mouth sores though she did note some mouth sensitivity.  No diarrhea.  Neuropathy symptoms are stable.  Objective:  Vital signs in last 24 hours:  Blood pressure 117/82, pulse 100, temperature 98.4 F (36.9 C), temperature source Oral, resp. rate 18, height 5' 7" (1.702 m), weight 120 lb 11.2 oz (54.7 kg), SpO2 100 %.    HEENT: No thrush or ulcers. Cardio: Regular rate and rhythm. GI: Abdomen soft and nontender.  No hepatomegaly.  Left lower quadrant colostomy. Vascular: No leg edema. Neuro: Alert and oriented. Skin: No rash. Port-A-Cath without erythema.   Lab Results:  Lab Results  Component Value Date   WBC 3.4 (L) 12/28/2018   HGB 9.5 (L) 12/28/2018   HCT 31.0 (L) 12/28/2018   MCV 61.5 (L) 12/28/2018   PLT 257 12/28/2018   NEUTROABS 1.9 12/28/2018    Imaging:  No results found.  Medications: I have reviewed the patient's current medications.  Assessment/Plan: 1. Rectal cancer-rectal mass noted on digital exam 02/10/2018, colonoscopy confirmed a him my circumferential mass in the rectum ? 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 CTs 02/10/2018-anterior rectal wall thickening, pulmonary nodules measuring up to 9 mm concerning for metastases, few round perirectal lymph nodes ? Pelvic MRI 02/20/2018-hypermetabolic low rectal mass extending to the  posterior vagina with 2 small enlarged perirectal lymph nodes, T4b,N1-2.3 cm from the anal verge ? PET scan 02/23/2018-hypermetabolic rectal mass, 8 mm hypermetabolic lingular nodule, scattered small bilateral lung nodules, some calcified, a few with mildly increased activity ? Cycle 1 FOLFOXIRI8/21/2019 ? Cycle 5 FOLFOXIRI10/16/2019 ? CTs 05/01/2018-significant interval response to therapy with decreased size of the primary rectal mass lesion. Decreasing perirectal lymphadenopathy. Decreased and/or resolved pulmonary nodules. No new sites of disease identified. ? Cycle 6 FOLFOXIRI10/31/2019 ? Radiation/Xeloda 06/12/2018-completed 07/21/2018 ? Xeloda dose reduced 07/10/2018 due to mucositis and diarrhea ? CTs 08/07/2018- compared to 02/10/2018- resolved and decreased pulmonary nodules, few tiny residual noncalcified nodules, decreased 20 perirectal lymph nodes, soft tissue of the rectum indistinguishable from posterior wall of vagina- Vaginal involvement by tumor? ? 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 CT chest 09/29/2018- enlargement of lung nodules, not a candidate for SBRT based on discussion in GI tumor conference and with radiation oncology ? CT chest 11/27/18 - interval growth of numerous pulmonary metastases bilaterally compared to 09/29/18, no new pulmonary mets  ? Cycle 1 FOLFOXIRI 11/29/18 ? Cycle 2 FOLFOXIRI 12/14/2018 ? Cycle 3 FOLFOXIRI 12/28/2018  2.History of diarrhea and rectal pain secondary to #1 3.History of tobacco use 4.Anemia secondary to thalassemia, rectal bleeding, and potentially iron deficiency 5.Diarrhea secondary to Xeloda and radiation. Imodium as needed. 6.Mucositis secondary to Xeloda. Improved 07/19/2018. 7. History of  migraines, has experienced weekly migraines for past 3 weeks (11/29/18)   Disposition: Tina Morales appears  stable.  She has completed 2 cycles of FOLFOXIRI.  Plan to proceed with cycle 3 today as scheduled.    She again had delayed nausea.  She receives Aloxi, Emend and dexamethasone today.  She takes dexamethasone orally beginning day 2.  She takes Compazine as needed.  She understands she can begin Zofran as needed after 72 hours.  I am also sending a prescription to her pharmacy for Ativan 0.5 mg every 8 hours as needed.  She will discontinue Xanax which she has been taking at nighttime to help with sleep.  We reviewed the CBC from today.  Counts are adequate for treatment.  She will return for lab, follow-up and cycle 4 FOLFOXIRI in 2 weeks.  She will contact the office in the interim with any problems.  We specifically discussed poorly controlled nausea.  Plan reviewed with Dr. Benay Spice.  Ned Card ANP/GNP-BC   12/28/2018  9:56 AM

## 2018-12-28 NOTE — Progress Notes (Signed)
Patient was connected to her 5FU pump at 1529 to run for 48 hours. Unable to reach MD about disconnection time on Saturday. Desk RN Manuela Schwartz informed and she stated that she would send MD an in basket to address this, she went ahead to advise that patient come in at 1400 on Saturday for pump disconnection. Patient informed and she verbalized understanding.

## 2018-12-30 ENCOUNTER — Inpatient Hospital Stay: Payer: BC Managed Care – PPO

## 2018-12-30 ENCOUNTER — Other Ambulatory Visit: Payer: Self-pay

## 2018-12-30 VITALS — BP 108/77 | HR 86 | Temp 98.2°F | Resp 18

## 2018-12-30 DIAGNOSIS — C20 Malignant neoplasm of rectum: Secondary | ICD-10-CM

## 2018-12-30 DIAGNOSIS — Z5111 Encounter for antineoplastic chemotherapy: Secondary | ICD-10-CM | POA: Diagnosis not present

## 2018-12-30 MED ORDER — SODIUM CHLORIDE 0.9% FLUSH
10.0000 mL | INTRAVENOUS | Status: DC | PRN
  Administered 2018-12-30: 10 mL
  Filled 2018-12-30: qty 10

## 2018-12-30 MED ORDER — HEPARIN SOD (PORK) LOCK FLUSH 100 UNIT/ML IV SOLN
500.0000 [IU] | Freq: Once | INTRAVENOUS | Status: AC | PRN
  Administered 2018-12-30: 500 [IU]
  Filled 2018-12-30: qty 5

## 2019-01-01 ENCOUNTER — Telehealth: Payer: Self-pay | Admitting: Nurse Practitioner

## 2019-01-01 NOTE — Telephone Encounter (Signed)
Scheduled per los. Called and left msg  ° °

## 2019-01-07 ENCOUNTER — Other Ambulatory Visit: Payer: Self-pay | Admitting: Oncology

## 2019-01-11 ENCOUNTER — Inpatient Hospital Stay: Payer: BC Managed Care – PPO | Attending: Oncology

## 2019-01-11 ENCOUNTER — Inpatient Hospital Stay (HOSPITAL_BASED_OUTPATIENT_CLINIC_OR_DEPARTMENT_OTHER): Payer: BC Managed Care – PPO | Admitting: Oncology

## 2019-01-11 ENCOUNTER — Other Ambulatory Visit: Payer: Self-pay | Admitting: *Deleted

## 2019-01-11 ENCOUNTER — Telehealth: Payer: Self-pay | Admitting: Oncology

## 2019-01-11 ENCOUNTER — Inpatient Hospital Stay: Payer: BC Managed Care – PPO

## 2019-01-11 ENCOUNTER — Other Ambulatory Visit: Payer: Self-pay

## 2019-01-11 VITALS — BP 104/74 | HR 115 | Temp 98.9°F | Ht 67.0 in | Wt 120.9 lb

## 2019-01-11 VITALS — HR 102

## 2019-01-11 DIAGNOSIS — D6481 Anemia due to antineoplastic chemotherapy: Secondary | ICD-10-CM | POA: Insufficient documentation

## 2019-01-11 DIAGNOSIS — C7800 Secondary malignant neoplasm of unspecified lung: Secondary | ICD-10-CM

## 2019-01-11 DIAGNOSIS — C7801 Secondary malignant neoplasm of right lung: Secondary | ICD-10-CM | POA: Diagnosis not present

## 2019-01-11 DIAGNOSIS — Z5111 Encounter for antineoplastic chemotherapy: Secondary | ICD-10-CM | POA: Diagnosis not present

## 2019-01-11 DIAGNOSIS — C20 Malignant neoplasm of rectum: Secondary | ICD-10-CM

## 2019-01-11 DIAGNOSIS — D701 Agranulocytosis secondary to cancer chemotherapy: Secondary | ICD-10-CM | POA: Diagnosis not present

## 2019-01-11 DIAGNOSIS — C7802 Secondary malignant neoplasm of left lung: Secondary | ICD-10-CM | POA: Diagnosis not present

## 2019-01-11 DIAGNOSIS — K5900 Constipation, unspecified: Secondary | ICD-10-CM | POA: Insufficient documentation

## 2019-01-11 DIAGNOSIS — Z79899 Other long term (current) drug therapy: Secondary | ICD-10-CM | POA: Diagnosis not present

## 2019-01-11 DIAGNOSIS — Z923 Personal history of irradiation: Secondary | ICD-10-CM | POA: Diagnosis not present

## 2019-01-11 DIAGNOSIS — R509 Fever, unspecified: Secondary | ICD-10-CM | POA: Insufficient documentation

## 2019-01-11 DIAGNOSIS — Z933 Colostomy status: Secondary | ICD-10-CM

## 2019-01-11 DIAGNOSIS — Z95828 Presence of other vascular implants and grafts: Secondary | ICD-10-CM

## 2019-01-11 LAB — CMP (CANCER CENTER ONLY)
ALT: 14 U/L (ref 0–44)
AST: 33 U/L (ref 15–41)
Albumin: 3.1 g/dL — ABNORMAL LOW (ref 3.5–5.0)
Alkaline Phosphatase: 124 U/L (ref 38–126)
Anion gap: 13 (ref 5–15)
BUN: 5 mg/dL — ABNORMAL LOW (ref 6–20)
CO2: 24 mmol/L (ref 22–32)
Calcium: 8.6 mg/dL — ABNORMAL LOW (ref 8.9–10.3)
Chloride: 100 mmol/L (ref 98–111)
Creatinine: 0.59 mg/dL (ref 0.44–1.00)
GFR, Est AFR Am: >60 mL/min (ref >60)
GFR, Estimated: >60 mL/min (ref >60)
Glucose, Bld: 108 mg/dL — ABNORMAL HIGH (ref 70–99)
Potassium: 3.1 mmol/L — ABNORMAL LOW (ref 3.5–5.1)
Sodium: 137 mmol/L (ref 135–145)
Total Bilirubin: 0.4 mg/dL (ref 0.3–1.2)
Total Protein: 6 g/dL — ABNORMAL LOW (ref 6.5–8.1)

## 2019-01-11 LAB — CBC WITH DIFFERENTIAL (CANCER CENTER ONLY)
Abs Immature Granulocytes: 0.02 10*3/uL (ref 0.00–0.07)
Basophils Absolute: 0 10*3/uL (ref 0.0–0.1)
Basophils Relative: 0 %
Eosinophils Absolute: 0 10*3/uL (ref 0.0–0.5)
Eosinophils Relative: 0 %
HCT: 28.5 % — ABNORMAL LOW (ref 36.0–46.0)
Hemoglobin: 8.9 g/dL — ABNORMAL LOW (ref 12.0–15.0)
Immature Granulocytes: 1 %
Lymphocytes Relative: 22 %
Lymphs Abs: 0.6 10*3/uL — ABNORMAL LOW (ref 0.7–4.0)
MCH: 19.6 pg — ABNORMAL LOW (ref 26.0–34.0)
MCHC: 31.2 g/dL (ref 30.0–36.0)
MCV: 62.8 fL — ABNORMAL LOW (ref 80.0–100.0)
Monocytes Absolute: 0.6 10*3/uL (ref 0.1–1.0)
Monocytes Relative: 22 %
Neutro Abs: 1.5 10*3/uL — ABNORMAL LOW (ref 1.7–7.7)
Neutrophils Relative %: 55 %
Platelet Count: 139 10*3/uL — ABNORMAL LOW (ref 150–400)
RBC: 4.54 MIL/uL (ref 3.87–5.11)
RDW: 20.9 % — ABNORMAL HIGH (ref 11.5–15.5)
WBC Count: 2.8 10*3/uL — ABNORMAL LOW (ref 4.0–10.5)
nRBC: 0.7 % — ABNORMAL HIGH (ref 0.0–0.2)

## 2019-01-11 LAB — MAGNESIUM: Magnesium: 1.3 mg/dL — CL (ref 1.7–2.4)

## 2019-01-11 MED ORDER — SODIUM CHLORIDE 0.9 % IV SOLN: 5000.0000 mg | INTRAVENOUS | Status: DC

## 2019-01-11 MED ORDER — HEPARIN SOD (PORK) LOCK FLUSH 100 UNIT/ML IV SOLN
500.0000 [IU] | Freq: Once | INTRAVENOUS | Status: DC | PRN
  Filled 2019-01-11: qty 5

## 2019-01-11 MED ORDER — PALONOSETRON HCL INJECTION 0.25 MG/5ML
INTRAVENOUS | Status: AC
  Filled 2019-01-11: qty 5

## 2019-01-11 MED ORDER — DEXTROSE 5 % IV SOLN
Freq: Once | INTRAVENOUS | Status: AC
  Administered 2019-01-11: 10:00:00 via INTRAVENOUS
  Filled 2019-01-11: qty 250

## 2019-01-11 MED ORDER — PALONOSETRON HCL INJECTION 0.25 MG/5ML
0.2500 mg | Freq: Once | INTRAVENOUS | Status: AC
  Administered 2019-01-11: 10:00:00 0.25 mg via INTRAVENOUS

## 2019-01-11 MED ORDER — SODIUM CHLORIDE 0.9 % IV SOLN
Freq: Once | INTRAVENOUS | Status: AC
  Administered 2019-01-11: 10:00:00 via INTRAVENOUS
  Filled 2019-01-11: qty 5

## 2019-01-11 MED ORDER — OXALIPLATIN CHEMO INJECTION 100 MG/20ML
85.0000 mg/m2 | Freq: Once | INTRAVENOUS | Status: AC
  Administered 2019-01-11: 135 mg via INTRAVENOUS
  Filled 2019-01-11: qty 10

## 2019-01-11 MED ORDER — SODIUM CHLORIDE 0.9% FLUSH
10.0000 mL | INTRAVENOUS | Status: DC | PRN
  Filled 2019-01-11: qty 10

## 2019-01-11 MED ORDER — LEUCOVORIN CALCIUM INJECTION 350 MG
200.0000 mg/m2 | Freq: Once | INTRAVENOUS | Status: AC
  Administered 2019-01-11: 318 mg via INTRAVENOUS
  Filled 2019-01-11: qty 15.9

## 2019-01-11 MED ORDER — SODIUM CHLORIDE 0.9 % IV SOLN
5000.0000 mg | INTRAVENOUS | Status: DC
  Administered 2019-01-11: 14:00:00 5000 mg via INTRAVENOUS
  Filled 2019-01-11: qty 100

## 2019-01-11 MED ORDER — ALPRAZOLAM 0.5 MG PO TABS: 0.5000 mg | ORAL_TABLET | Freq: Two times a day (BID) | ORAL | 0 refills | Status: DC | PRN

## 2019-01-11 MED ORDER — SODIUM CHLORIDE 0.9% FLUSH
10.0000 mL | Freq: Once | INTRAVENOUS | Status: AC
  Administered 2019-01-11: 10 mL
  Filled 2019-01-11: qty 10

## 2019-01-11 MED ORDER — DEXTROSE 5 % IV SOLN
Freq: Once | INTRAVENOUS | Status: AC
  Administered 2019-01-11: 12:00:00 via INTRAVENOUS
  Filled 2019-01-11: qty 250

## 2019-01-11 MED ORDER — IRINOTECAN HCL CHEMO INJECTION 100 MG/5ML
150.0000 mg/m2 | Freq: Once | INTRAVENOUS | Status: AC
  Administered 2019-01-11: 240 mg via INTRAVENOUS
  Filled 2019-01-11: qty 12

## 2019-01-11 MED ORDER — POTASSIUM CHLORIDE CRYS ER 20 MEQ PO TBCR: 20.0000 meq | EXTENDED_RELEASE_TABLET | Freq: Two times a day (BID) | ORAL | 1 refills | Status: DC

## 2019-01-11 MED ORDER — DEXAMETHASONE 4 MG PO TABS: ORAL_TABLET | ORAL | 3 refills | Status: DC

## 2019-01-11 NOTE — Telephone Encounter (Signed)
Left message re 7/4 appointment.

## 2019-01-11 NOTE — Progress Notes (Signed)
Per Dr. Benay Spice : Will give 2 grams IV Mg+ on Saturday and increase K+ to bid at home. New script sent and requested infusion nurse communicate this to patient.

## 2019-01-11 NOTE — Patient Instructions (Addendum)
*Increase your potassium chloride to 20 meq twice daily (new script sent). Return on Saturday for pump d/c and IV magnesium (1 hour infusion).  [facility] Discharge Instructions for Patients Receiving Chemotherapy  Today you received the following chemotherapy agents Irinotecan (CAMPTOSAR), Oxaliplatin (PARAPLATIN), Leucovorin & Flourouracil (ADRUCIL).  To help prevent nausea and vomiting after your treatment, we encourage you to take your nausea medication as prescribed.   If you develop nausea and vomiting that is not controlled by your nausea medication, call the clinic.   BELOW ARE SYMPTOMS THAT SHOULD BE REPORTED IMMEDIATELY:  *FEVER GREATER THAN 100.5 F  *CHILLS WITH OR WITHOUT FEVER  NAUSEA AND VOMITING THAT IS NOT CONTROLLED WITH YOUR NAUSEA MEDICATION  *UNUSUAL SHORTNESS OF BREATH  *UNUSUAL BRUISING OR BLEEDING  TENDERNESS IN MOUTH AND THROAT WITH OR WITHOUT PRESENCE OF ULCERS  *URINARY PROBLEMS  *BOWEL PROBLEMS  UNUSUAL RASH Items with * indicate a potential emergency and should be followed up as soon as possible.  Feel free to call the clinic should you have any questions or concerns. The clinic phone number is [phone].  Please show the [place] at check-in to the Emergency Department and triage nurse.  Coronavirus (COVID-19) Are you at risk?  Are you at risk for the Coronavirus (COVID-19)?  To be considered HIGH RISK for Coronavirus (COVID-19), you have to meet the following criteria:  . Traveled to [place], [place], [place], [place] or [place]; or in the [place] to [place], [place], [place], or [place]; and have fever, cough, and shortness of breath within the last 2 weeks of travel OR . Been in close contact with a person diagnosed with COVID-19 within the last 2 weeks and have fever, cough, and shortness of breath . IF YOU DO NOT MEET THESE CRITERIA, YOU ARE CONSIDERED LOW RISK FOR COVID-19.  What to do if you are  HIGH RISK for COVID-19?  Marland Kitchen If you are having a medical emergency, call 911. . Seek medical care right away. Before you go to a doctor's office, urgent care or emergency department, call ahead and tell them about your recent travel, contact with someone diagnosed with COVID-19, and your symptoms. You should receive instructions from your physician's office regarding next steps of care.  . When you arrive at healthcare provider, tell the healthcare staff immediately you have returned from visiting [place], [place], [place], [place] or [place]; or traveled in the [place] to [place], [place], [place], or [place]; in the last two weeks or you have been in close contact with a person diagnosed with COVID-19 in the last 2 weeks.   . Tell the health care staff about your symptoms: fever, cough and shortness of breath. . After you have been seen by a medical provider, you will be either: o Tested for (COVID-19) and discharged home on quarantine except to seek medical care if symptoms worsen, and asked to  - Stay home and avoid contact with others until you get your results (4-5 days)  - Avoid travel on public transportation if possible (such as bus, train, or airplane) or o Sent to the Emergency Department by EMS for evaluation, COVID-19 testing, and possible admission depending on your condition and test results.  What to do if you are LOW RISK for COVID-19?  Reduce your risk of any infection by using the same precautions used for avoiding the common cold or flu:  Marland Kitchen Wash your hands often with soap and  warm water for at least 20 seconds.  If soap and water are not readily available, use an alcohol-based hand sanitizer with at least 60% alcohol.  . If coughing or sneezing, cover your mouth and nose by coughing or sneezing into the elbow areas of your shirt or coat, into a tissue or into your sleeve (not your hands). . Avoid shaking hands with others and consider head nods or verbal greetings  only. . Avoid touching your eyes, nose, or mouth with unwashed hands.  . Avoid close contact with people who are sick. . Avoid places or events with large numbers of people in one location, like concerts or sporting events. . Carefully consider travel plans you have or are making. . If you are planning any travel outside or inside the [place], visit the CDC's Travelers' Health webpage for the latest health notices. . If you have some symptoms but not all symptoms, continue to monitor at home and seek medical attention if your symptoms worsen. . If you are having a medical emergency, call 911.   [place] / e-Visit: [email or web address]         [facility]: [facility]: [phone]                   [facility]: [phone]

## 2019-01-11 NOTE — Progress Notes (Signed)
[facility] OFFICE PROGRESS NOTE   Diagnosis: Rectal cancer  INTERVAL HISTORY:   [name] completed another cycle of FOLFIRINOX on 12/28/2018.  She had nausea following chemotherapy despite Decadron prophylaxis.  Zofran has not helped the nausea.  She has constipation for several days following chemotherapy.  The constipation was relieved with magnesium citrate. No change in neuropathy symptoms in the feet, that predated this course of chemotherapy.  She had transient numbness in the hands following this cycle of chemotherapy.  She had cold sensitivity lasting for a week following chemotherapy.  Good appetite.  The colostomy is now functioning well.  Objective:  Vital signs in last 24 hours:  Blood pressure 104/74, pulse (!) 115, temperature 98.9 F (37.2 C), temperature source Oral, height 5' 7"  (1.702 m), weight 120 lb 14.4 oz (54.8 kg), SpO2 100 %.    HEENT: No thrush or ulcers Resp: Lungs clear bilaterally Cardio: Regular rate and rhythm GI: No hepatomegaly, left lower quadrant colostomy with brown stool Vascular: No leg edema  Skin: Palms without erythema  Portacath/PICC-without erythema  Lab Results:  Lab Results  Component Value Date   WBC 2.8 (L) 01/11/2019   HGB 8.9 (L) 01/11/2019   HCT 28.5 (L) 01/11/2019   MCV 62.8 (L) 01/11/2019   PLT 139 (L) 01/11/2019   NEUTROABS 1.5 (L) 01/11/2019    CMP  Lab Results  Component Value Date   NA 137 01/11/2019   K 3.1 (L) 01/11/2019   CL 100 01/11/2019   CO2 24 01/11/2019   GLUCOSE 108 (H) 01/11/2019   BUN 5 (L) 01/11/2019   CREATININE 0.59 01/11/2019   CALCIUM 8.6 (L) 01/11/2019   PROT 6.0 (L) 01/11/2019   ALBUMIN 3.1 (L) 01/11/2019   AST 33 01/11/2019   ALT 14 01/11/2019   ALKPHOS 124 01/11/2019   BILITOT 0.4 01/11/2019   GFRNONAA >60 01/11/2019   GFRAA >60 01/11/2019    Lab Results  Component Value Date   CEA1 1.84 11/29/2018     Medications: I have reviewed the patient's current  medications.   Assessment/Plan: 1. Rectal cancer-rectal mass noted on digital exam 02/10/2018, colonoscopy confirmed a him my circumferential mass in the rectum ? 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 CTs 02/10/2018-anterior rectal wall thickening, pulmonary nodules measuring up to 9 mm concerning for metastases, few round perirectal lymph nodes ? Pelvic MRI 02/20/2018-hypermetabolic low rectal mass extending to the posterior vagina with 2 small enlarged perirectal lymph nodes, T4b,N1-2.3 cm from the anal verge ? PET scan 02/23/2018-hypermetabolic rectal mass, 8 mm hypermetabolic lingular nodule, scattered small bilateral lung nodules, some calcified, a few with mildly increased activity ? Cycle 1 FOLFOXIRI8/21/2019 ? Cycle 5 FOLFOXIRI10/16/2019 ? CTs 05/01/2018-significant interval response to therapy with decreased size of the primary rectal mass lesion. Decreasing perirectal lymphadenopathy. Decreased and/or resolved pulmonary nodules. No new sites of disease identified. ? Cycle 6 FOLFOXIRI10/31/2019 ? Radiation/Xeloda 06/12/2018-completed 07/21/2018 ? Xeloda dose reduced 07/10/2018 due to mucositis and diarrhea ? CTs 08/07/2018- compared to 02/10/2018- resolved and decreased pulmonary nodules, few tiny residual noncalcified nodules, decreased 20 perirectal lymph nodes, soft tissue of the rectum indistinguishable from posterior wall of vagina- Vaginal involvement by tumor? ? APR/vaginectomy 09/01/2018-ypT4,ypNo, negative resection margins, involvement of the vagina per review of slides at GI tumor conference,notreatment effect-tumor regression score 3,no loss of mismatch repair  protein expression, MSI-stable ? [id number] ? CT chest 09/29/2018-  enlargement of lung nodules, not a candidate for SBRT based on discussion in GI tumor conference and with radiation oncology ? CT chest 11/27/18 - interval growth of numerous pulmonary metastases bilaterally compared to 09/29/18, no new pulmonary mets  ? Cycle 1 FOLFOXIRI 11/29/18 ? Cycle 2 FOLFOXIRI 12/14/2018 ? Cycle 3 FOLFOXIRI 12/28/2018 ? 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 Imodium as needed. 6.Mucositis secondary to Xeloda. Improved 07/19/2018. 7. History of migraines    Disposition: Tina Morales appears stable.  She will complete another cycle of FOLFIRINOX today.  She will undergo a restaging chest CT after this cycle.  The postchemotherapy Decadron prophylaxis will be increased.  The potassium is low today.  She is taking potassium once daily.  We will check a magnesium level.  The plan is to replete the magnesium and adjust the potassium dose as indicated.  She will return for office visit and scheduled FOLFIRINOX in 2 weeks.  Betsy Coder, MD  01/11/2019  9:54 AM

## 2019-01-11 NOTE — Progress Notes (Signed)
OK to treat with Pulse 102 per Dr. Magda Paganini from Pharmacy notified of order for 2 gram Magnesium IV infusion planned for Saturday.  Pt instructed to increase po K to twice daily

## 2019-01-13 ENCOUNTER — Other Ambulatory Visit: Payer: Self-pay

## 2019-01-13 ENCOUNTER — Inpatient Hospital Stay: Payer: BC Managed Care – PPO

## 2019-01-13 VITALS — BP 120/80 | HR 75 | Temp 98.9°F | Resp 16

## 2019-01-13 DIAGNOSIS — C20 Malignant neoplasm of rectum: Secondary | ICD-10-CM

## 2019-01-13 DIAGNOSIS — Z5111 Encounter for antineoplastic chemotherapy: Secondary | ICD-10-CM | POA: Diagnosis not present

## 2019-01-13 MED ORDER — SODIUM CHLORIDE 0.9% FLUSH
10.0000 mL | INTRAVENOUS | Status: DC | PRN
  Administered 2019-01-13: 10 mL
  Filled 2019-01-13: qty 10

## 2019-01-13 MED ORDER — MAGNESIUM SULFATE 2 GM/50ML IV SOLN
2.0000 g | Freq: Once | INTRAVENOUS | Status: AC
  Administered 2019-01-13: 2 g via INTRAVENOUS

## 2019-01-13 MED ORDER — HEPARIN SOD (PORK) LOCK FLUSH 100 UNIT/ML IV SOLN
500.0000 [IU] | Freq: Once | INTRAVENOUS | Status: AC | PRN
  Administered 2019-01-13: 500 [IU]
  Filled 2019-01-13: qty 5

## 2019-01-13 NOTE — Patient Instructions (Signed)
Hypomagnesemia Hypomagnesemia is a condition in which the level of magnesium in the blood is low. Magnesium is a mineral that is found in many foods. It is used in many different processes in the body. Hypomagnesemia can affect every organ in the body. In severe cases, it can cause life-threatening problems. What are the causes? This condition may be caused by:  Not getting enough magnesium in your diet.  Malnutrition.  Problems with absorbing magnesium from the intestines.  Dehydration.  Alcohol abuse.  Vomiting.  Severe or chronic diarrhea.  Some medicines, including medicines that make you urinate more (diuretics).  Certain diseases, such as kidney disease, diabetes, celiac disease, and overactive thyroid. What are the signs or symptoms? Symptoms of this condition include:  Loss of appetite.  Nausea and vomiting.  Involuntary shaking or trembling of a body part (tremor).  Muscle weakness.  Tingling in the arms and legs.  Sudden tightening of muscles (muscle spasms).  Confusion.  Psychiatric issues, such as depression, irritability, or psychosis.  A feeling of fluttering of the heart.  Seizures. These symptoms are more severe if magnesium levels drop suddenly. How is this diagnosed? This condition may be diagnosed based on:  Your symptoms and medical history.  A physical exam.  Blood and urine tests. How is this treated? Treatment depends on the cause and the severity of the condition. It may be treated with:  A magnesium supplement. This can be taken in pill form. If the condition is severe, magnesium is usually given through an IV.  Changes to your diet. You may be directed to eat foods that have a lot of magnesium, such as green leafy vegetables, peas, beans, and nuts.  Stopping any intake of alcohol. Follow these instructions at home:      Make sure that your diet includes foods with magnesium. Foods that have a lot of magnesium in them include:  ? Green leafy vegetables, such as spinach and broccoli. ? Beans and peas. ? Nuts and seeds, such as almonds and sunflower seeds. ? Whole grains, such as whole grain bread and fortified cereals.  Take magnesium supplements if your health care provider tells you to do that. Take them as directed.  Take over-the-counter and prescription medicines only as told by your health care provider.  Have your magnesium levels monitored as told by your health care provider.  When you are active, drink fluids that contain electrolytes.  Avoid drinking alcohol.  Keep all follow-up visits as told by your health care provider. This is important. Contact a health care provider if:  You get worse instead of better.  Your symptoms return. Get help right away if you:  Develop severe muscle weakness.  Have trouble breathing.  Feel that your heart is racing. Summary  Hypomagnesemia is a condition in which the level of magnesium in the blood is low.  Hypomagnesemia can affect every organ in the body.  Treatment may include eating more foods that contain magnesium, taking magnesium supplements, and not drinking alcohol.  Have your magnesium levels monitored as told by your health care provider. This information is not intended to replace advice given to you by your health care provider. Make sure you discuss any questions you have with your health care provider. Document Released: 03/24/2005 Document Revised: 06/10/2017 Document Reviewed: 05/30/2017 Elsevier Patient Education  2020 [employer] Inc.  

## 2019-01-21 ENCOUNTER — Other Ambulatory Visit: Payer: Self-pay | Admitting: Oncology

## 2019-01-24 ENCOUNTER — Encounter (HOSPITAL_COMMUNITY): Payer: Self-pay

## 2019-01-24 ENCOUNTER — Other Ambulatory Visit: Payer: Self-pay

## 2019-01-24 ENCOUNTER — Ambulatory Visit (HOSPITAL_COMMUNITY)
Admission: RE | Admit: 2019-01-24 | Discharge: 2019-01-24 | Disposition: A | Discharge status: Home / Self Care | Payer: BC Managed Care – PPO | Source: Ambulatory Visit | Attending: Oncology | Admitting: Oncology

## 2019-01-24 DIAGNOSIS — C20 Malignant neoplasm of rectum: Secondary | ICD-10-CM | POA: Insufficient documentation

## 2019-01-25 ENCOUNTER — Inpatient Hospital Stay (HOSPITAL_BASED_OUTPATIENT_CLINIC_OR_DEPARTMENT_OTHER): Payer: BC Managed Care – PPO | Admitting: Nurse Practitioner

## 2019-01-25 ENCOUNTER — Inpatient Hospital Stay: Payer: BC Managed Care – PPO

## 2019-01-25 ENCOUNTER — Encounter: Payer: Self-pay | Admitting: Nurse Practitioner

## 2019-01-25 ENCOUNTER — Telehealth: Payer: Self-pay

## 2019-01-25 ENCOUNTER — Other Ambulatory Visit: Payer: Self-pay

## 2019-01-25 ENCOUNTER — Other Ambulatory Visit: Payer: Self-pay | Admitting: Nurse Practitioner

## 2019-01-25 VITALS — BP 98/77 | HR 105 | Temp 99.1°F | Resp 18 | Ht 67.0 in | Wt 120.1 lb

## 2019-01-25 DIAGNOSIS — C20 Malignant neoplasm of rectum: Secondary | ICD-10-CM

## 2019-01-25 DIAGNOSIS — C7801 Secondary malignant neoplasm of right lung: Secondary | ICD-10-CM

## 2019-01-25 DIAGNOSIS — Z95828 Presence of other vascular implants and grafts: Secondary | ICD-10-CM

## 2019-01-25 DIAGNOSIS — C7802 Secondary malignant neoplasm of left lung: Secondary | ICD-10-CM

## 2019-01-25 DIAGNOSIS — Z79899 Other long term (current) drug therapy: Secondary | ICD-10-CM

## 2019-01-25 DIAGNOSIS — D701 Agranulocytosis secondary to cancer chemotherapy: Secondary | ICD-10-CM

## 2019-01-25 DIAGNOSIS — Z923 Personal history of irradiation: Secondary | ICD-10-CM

## 2019-01-25 DIAGNOSIS — R509 Fever, unspecified: Secondary | ICD-10-CM

## 2019-01-25 DIAGNOSIS — D6481 Anemia due to antineoplastic chemotherapy: Secondary | ICD-10-CM | POA: Diagnosis not present

## 2019-01-25 DIAGNOSIS — K5900 Constipation, unspecified: Secondary | ICD-10-CM

## 2019-01-25 DIAGNOSIS — Z5111 Encounter for antineoplastic chemotherapy: Secondary | ICD-10-CM | POA: Diagnosis not present

## 2019-01-25 LAB — CBC WITH DIFFERENTIAL (CANCER CENTER ONLY)
Abs Immature Granulocytes: 0 10*3/uL (ref 0.00–0.07)
Basophils Absolute: 0 10*3/uL (ref 0.0–0.1)
Basophils Relative: 1 %
Eosinophils Absolute: 0 10*3/uL (ref 0.0–0.5)
Eosinophils Relative: 1 %
HCT: 25.1 % — ABNORMAL LOW (ref 36.0–46.0)
Hemoglobin: 8 g/dL — ABNORMAL LOW (ref 12.0–15.0)
Immature Granulocytes: 0 %
Lymphocytes Relative: 23 %
Lymphs Abs: 0.5 10*3/uL — ABNORMAL LOW (ref 0.7–4.0)
MCH: 20.1 pg — ABNORMAL LOW (ref 26.0–34.0)
MCHC: 31.9 g/dL (ref 30.0–36.0)
MCV: 63.1 fL — ABNORMAL LOW (ref 80.0–100.0)
Monocytes Absolute: 0.6 10*3/uL (ref 0.1–1.0)
Monocytes Relative: 28 %
Neutro Abs: 1 10*3/uL — ABNORMAL LOW (ref 1.7–7.7)
Neutrophils Relative %: 47 %
Platelet Count: 129 10*3/uL — ABNORMAL LOW (ref 150–400)
RBC: 3.98 MIL/uL (ref 3.87–5.11)
RDW: 23.5 % — ABNORMAL HIGH (ref 11.5–15.5)
WBC Count: 2.1 10*3/uL — ABNORMAL LOW (ref 4.0–10.5)
nRBC: 1.4 % — ABNORMAL HIGH (ref 0.0–0.2)

## 2019-01-25 LAB — CMP (CANCER CENTER ONLY)
ALT: 15 U/L (ref 0–44)
AST: 35 U/L (ref 15–41)
Albumin: 2.8 g/dL — ABNORMAL LOW (ref 3.5–5.0)
Alkaline Phosphatase: 124 U/L (ref 38–126)
Anion gap: 11 (ref 5–15)
BUN: 11 mg/dL (ref 6–20)
CO2: 23 mmol/L (ref 22–32)
Calcium: 8.1 mg/dL — ABNORMAL LOW (ref 8.9–10.3)
Chloride: 104 mmol/L (ref 98–111)
Creatinine: 0.56 mg/dL (ref 0.44–1.00)
GFR, Est AFR Am: >60 mL/min (ref >60)
GFR, Estimated: >60 mL/min (ref >60)
Glucose, Bld: 122 mg/dL — ABNORMAL HIGH (ref 70–99)
Potassium: 3.3 mmol/L — ABNORMAL LOW (ref 3.5–5.1)
Sodium: 138 mmol/L (ref 135–145)
Total Bilirubin: 0.5 mg/dL (ref 0.3–1.2)
Total Protein: 5.7 g/dL — ABNORMAL LOW (ref 6.5–8.1)

## 2019-01-25 LAB — MAGNESIUM: Magnesium: 1.3 mg/dL — CL (ref 1.7–2.4)

## 2019-01-25 MED ORDER — HEPARIN SOD (PORK) LOCK FLUSH 100 UNIT/ML IV SOLN
500.0000 [IU] | Freq: Once | INTRAVENOUS | Status: AC
  Administered 2019-01-25: 500 [IU]
  Filled 2019-01-25: qty 5

## 2019-01-25 MED ORDER — SODIUM CHLORIDE 0.9% FLUSH
10.0000 mL | Freq: Once | INTRAVENOUS | Status: AC
  Administered 2019-01-25: 10 mL
  Filled 2019-01-25: qty 10

## 2019-01-25 NOTE — Progress Notes (Signed)
PT didn't have a pump stop appt informed charge nurse Allen Derry, RN that she would need an appt.

## 2019-01-25 NOTE — Addendum Note (Signed)
Addended by: Lenox Ponds E on: 01/25/2019 11:18 AM   Modules accepted: Orders

## 2019-01-25 NOTE — Telephone Encounter (Signed)
Critical lab value called from lab. Patients mag 1.3 lisa NP aware

## 2019-01-25 NOTE — Progress Notes (Signed)
[facility] OFFICE PROGRESS NOTE   Diagnosis: Rectal cancer  INTERVAL HISTORY:   Tina Morales returns as scheduled.  She completed cycle 4 FOLFIRINOX on 01/11/2019.  She reports multiple issues following chemotherapy.  She has intermittent constipation.  She feels fatigued and weak.  She continues to have nausea.  She has intermittent headaches.  She has mouth sores which have resolved.  She notes generalized muscle soreness following chemotherapy.  For the past 2 weeks she has been having a low-grade fever on a daily basis.  No shaking chills.  She denies shortness of breath and cough.  No chest pain.  Objective:  Vital signs in last 24 hours:  Blood pressure 98/77, pulse (!) 105, temperature 99.1 F (37.3 C), temperature source Oral, resp. rate 18, height 5' 7"  (1.702 m), weight 120 lb 1 oz (54.5 kg), SpO2 100 %.    HEENT: No thrush or ulcers. Resp: Lungs clear bilaterally. GI: Abdomen soft and nontender.  No hepatomegaly.  Left lower quadrant colostomy. Vascular: No leg edema. Neuro: Vibratory sense intact over the fingertips per tuning fork exam. Skin: Palms without erythema. Port-A-Cath without erythema.   Lab Results:  Lab Results  Component Value Date   WBC 2.1 (L) 01/25/2019   HGB 8.0 (L) 01/25/2019   HCT 25.1 (L) 01/25/2019   MCV 63.1 (L) 01/25/2019   PLT 129 (L) 01/25/2019   NEUTROABS 1.0 (L) 01/25/2019    Imaging:  Ct Chest Wo Contrast  Result Date: 01/24/2019 CLINICAL DATA:  Restaging rectal cancer with lung metastases EXAM: CT CHEST WITHOUT CONTRAST TECHNIQUE: Multidetector CT imaging of the chest was performed following the standard protocol without IV contrast. COMPARISON:  11/27/2018 FINDINGS: Cardiovascular: The heart is normal in size. No pericardial effusion. No evidence of thoracic aortic aneurysm. Atherosclerotic calcifications of the aortic arch. Mild coronary atherosclerosis of the LAD. Right chest port terminates in the lower SVC.  Mediastinum/Nodes: No suspicious mediastinal lymphadenopathy. Visualized thyroid is unremarkable. Lungs/Pleura: Significant improvement of bilateral pulmonary metastases, with residual small scattered bilateral pulmonary nodules. Residual 2 x 3 mm nodule in the medial left upper lobe (series 7/image 32). Residual 3 mm nodule in the medial right upper lobe (series 7/image 60). Two adjacent 5 mm nodules in the anterior right upper lobe (series 7/image 63), partially cavitary, previously 10 mm. 4 mm cavitary nodule in the medial right upper lobe (series 7/image 70). 4 mm nodule in the posterior right lower lobe (series 7/image 112), previously 12 mm. Additional scattered 2-3 mm nodules in the lungs bilaterally. Benign calcified granuloma inferiorly in the right middle lobe (series 7/image 120). No focal consolidation. Mild centrilobular emphysematous changes, upper lung predominant. Biapical pleural-parenchymal scarring. No pleural effusion or pneumothorax. Upper Abdomen: Visualized upper abdomen is grossly unremarkable, noting vascular calcifications. Musculoskeletal: Visualized osseous structures are within normal limits. IMPRESSION: Significant improvement of bilateral pulmonary metastases, now measuring up to 4-5 mm, previously 10-12 mm. Aortic Atherosclerosis (ICD10-I70.0) and Emphysema (ICD10-J43.9). Electronically Signed   By: Julian Hy M.D.   On: 01/24/2019 12:23    Medications: I have reviewed the patient's current medications.  Assessment/Plan: 1. Rectal cancer-rectal mass noted on digital exam 02/10/2018, colonoscopy confirmed a him my circumferential mass in the rectum ? 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 CTs 02/10/2018-anterior rectal wall thickening,  pulmonary nodules measuring up to 9  mm concerning for metastases, few round perirectal lymph nodes ? Pelvic MRI 02/20/2018-hypermetabolic low rectal mass extending to the posterior vagina with 2 small enlarged perirectal lymph nodes, T4b,N1-2.3 cm from the anal verge ? PET scan 02/23/2018-hypermetabolic rectal mass, 8 mm hypermetabolic lingular nodule, scattered small bilateral lung nodules, some calcified, a few with mildly increased activity ? Cycle 1 FOLFOXIRI8/21/2019 ? Cycle 5 FOLFOXIRI10/16/2019 ? CTs 05/01/2018-significant interval response to therapy with decreased size of the primary rectal mass lesion. Decreasing perirectal lymphadenopathy. Decreased and/or resolved pulmonary nodules. No new sites of disease identified. ? Cycle 6 FOLFOXIRI10/31/2019 ? Radiation/Xeloda 06/12/2018-completed 07/21/2018 ? Xeloda dose reduced 07/10/2018 due to mucositis and diarrhea ? CTs 08/07/2018-compared to 02/10/2018-resolved and decreased pulmonary nodules, few tiny residual noncalcified nodules, decreased 20 perirectal lymph nodes, soft tissue of the rectum indistinguishable from posterior wall of vagina-Vaginal involvement by tumor? ? 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 CT chest 09/29/2018-enlargement of lung nodules, not a candidate for SBRT based on discussion in GI tumor conference and with radiation oncology ? CT chest 11/27/18 - interval growth of numerous pulmonary metastases bilaterally compared to 09/29/18, no new pulmonary mets ? Cycle 1FOLFOXIRI 11/29/18 ? Cycle 2 FOLFOXIRI6/10/2018 ? Cycle 3 FOLFOXIRI 12/28/2018 ? Cycle 4 FOLFIRINOX 01/11/2019 ? CTs 01/24/2019-significant improvement of bilateral pulmonary metastases.  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 Imodium as needed. 6.Mucositis secondary to Xeloda. Improved 07/19/2018. 7. History of migraines    Disposition: Tina Morales appears stable.  She has completed 4 cycles of FOLFOXIRI.  Restaging chest CT shows improvement in the lung lesions.    She is tolerating treatment poorly.  CBC from today shows progressive anemia and neutropenia.  Treatment will be held today and rescheduled for 1 week.  The plan is to eliminate Oxaliplatin from the regimen.  She is having intermittent daily low-grade fevers.  Source is unclear.  She will contact the office with fever greater than or equal to 100.5, shaking chills, other signs of infection.   She will return for lab, follow-up, FOLFIRI in 1 week.  She will contact the office in the interim as outlined above or with any other problems.  Patient seen with Dr. Benay Spice.  CT images reviewed on the computer with Tina Morales.  25 minutes were spent face-to-face at today's visit with the majority of that time involved in counseling/coordination of care.    Ned Card ANP/GNP-BC   01/25/2019  11:08 AM   This was a shared visit with Ned Card.  The restaging chest CT confirms a response to therapy.  We reviewed the CT images with Tina Morales.  She has completed 10 cycles of FOLFIRINOX.  She has mild neuropathy symptoms.  She has developed malaise and cytopenias from chemotherapy.  We decided to change to FOLFIRI.  Chemotherapy will be held today.  She will return for an office visit and cycle 1 FOLFIRI in 1 week.  We will G-CSF support if the white count remains low when she is here next week.  Julieanne Manson, MD

## 2019-01-25 NOTE — Patient Instructions (Signed)

## 2019-01-26 ENCOUNTER — Telehealth: Payer: Self-pay

## 2019-01-26 NOTE — Telephone Encounter (Signed)
Spoke with pt in regards to msg below. Per Ned Card NP:  Please call her with instructions to begin magnesium oxide 400 mg 3 times daily. She may have this at home already. If not please send a prescription to her pharmacy. Also instruct her to continue potassium 20 meq 3 times daily until she returns next week.   Please let scheduling know to increase her infusion time next week to allow for IV magnesium. Thanks  Scheduling msg was sent in regards to IV magnesium.   Pt verbalized understanding on how to take the magnesium oxide 400mg  and the potassium 20 meq. Pt states that she does have the magnesium oxide 400mg  at home.

## 2019-01-26 NOTE — Telephone Encounter (Signed)
-----   Message from Owens Shark, NP sent at 01/26/2019  8:51 AM EDT ----- Please call her with instructions to begin magnesium oxide 400 mg 3 times daily.  She may have this at home already.  If not please send a prescription to her pharmacy.  Also instruct her to continue potassium 20 meq 3 times daily until she returns next week.  Please let scheduling know to increase her infusion time next week to allow for IV magnesium.  Thanks

## 2019-01-30 ENCOUNTER — Telehealth: Payer: Self-pay | Admitting: Oncology

## 2019-01-30 NOTE — Telephone Encounter (Signed)
Called and left msg about 7/23 appt

## 2019-02-01 ENCOUNTER — Inpatient Hospital Stay (HOSPITAL_BASED_OUTPATIENT_CLINIC_OR_DEPARTMENT_OTHER): Payer: BC Managed Care – PPO | Admitting: Oncology

## 2019-02-01 ENCOUNTER — Inpatient Hospital Stay: Payer: BC Managed Care – PPO

## 2019-02-01 ENCOUNTER — Telehealth: Payer: Self-pay | Admitting: Oncology

## 2019-02-01 ENCOUNTER — Other Ambulatory Visit: Payer: Self-pay

## 2019-02-01 VITALS — BP 99/75 | HR 104 | Temp 98.7°F | Resp 20 | Ht 67.0 in | Wt 121.8 lb

## 2019-02-01 VITALS — HR 105

## 2019-02-01 DIAGNOSIS — R109 Unspecified abdominal pain: Secondary | ICD-10-CM | POA: Diagnosis not present

## 2019-02-01 DIAGNOSIS — C7801 Secondary malignant neoplasm of right lung: Secondary | ICD-10-CM

## 2019-02-01 DIAGNOSIS — Z79899 Other long term (current) drug therapy: Secondary | ICD-10-CM

## 2019-02-01 DIAGNOSIS — K625 Hemorrhage of anus and rectum: Secondary | ICD-10-CM

## 2019-02-01 DIAGNOSIS — Z923 Personal history of irradiation: Secondary | ICD-10-CM

## 2019-02-01 DIAGNOSIS — D569 Thalassemia, unspecified: Secondary | ICD-10-CM

## 2019-02-01 DIAGNOSIS — C20 Malignant neoplasm of rectum: Secondary | ICD-10-CM

## 2019-02-01 DIAGNOSIS — Z5111 Encounter for antineoplastic chemotherapy: Secondary | ICD-10-CM | POA: Diagnosis not present

## 2019-02-01 DIAGNOSIS — K5900 Constipation, unspecified: Secondary | ICD-10-CM

## 2019-02-01 DIAGNOSIS — C7802 Secondary malignant neoplasm of left lung: Secondary | ICD-10-CM | POA: Diagnosis not present

## 2019-02-01 DIAGNOSIS — D649 Anemia, unspecified: Secondary | ICD-10-CM

## 2019-02-01 DIAGNOSIS — Z933 Colostomy status: Secondary | ICD-10-CM

## 2019-02-01 DIAGNOSIS — Z87891 Personal history of nicotine dependence: Secondary | ICD-10-CM

## 2019-02-01 LAB — CMP (CANCER CENTER ONLY)
ALT: 13 U/L (ref 0–44)
AST: 31 U/L (ref 15–41)
Albumin: 3 g/dL — ABNORMAL LOW (ref 3.5–5.0)
Alkaline Phosphatase: 150 U/L — ABNORMAL HIGH (ref 38–126)
Anion gap: 10 (ref 5–15)
BUN: 7 mg/dL (ref 6–20)
CO2: 25 mmol/L (ref 22–32)
Calcium: 8.9 mg/dL (ref 8.9–10.3)
Chloride: 102 mmol/L (ref 98–111)
Creatinine: 0.57 mg/dL (ref 0.44–1.00)
GFR, Est AFR Am: >60 mL/min (ref >60)
GFR, Estimated: >60 mL/min (ref >60)
Glucose, Bld: 99 mg/dL (ref 70–99)
Potassium: 4.1 mmol/L (ref 3.5–5.1)
Sodium: 137 mmol/L (ref 135–145)
Total Bilirubin: 0.4 mg/dL (ref 0.3–1.2)
Total Protein: 6.1 g/dL — ABNORMAL LOW (ref 6.5–8.1)

## 2019-02-01 LAB — MAGNESIUM: Magnesium: 1.8 mg/dL (ref 1.7–2.4)

## 2019-02-01 LAB — CBC WITH DIFFERENTIAL (CANCER CENTER ONLY)
Abs Immature Granulocytes: 0.14 10*3/uL — ABNORMAL HIGH (ref 0.00–0.07)
Basophils Absolute: 0 10*3/uL (ref 0.0–0.1)
Basophils Relative: 0 %
Eosinophils Absolute: 0 10*3/uL (ref 0.0–0.5)
Eosinophils Relative: 1 %
HCT: 27.3 % — ABNORMAL LOW (ref 36.0–46.0)
Hemoglobin: 8.5 g/dL — ABNORMAL LOW (ref 12.0–15.0)
Immature Granulocytes: 3 %
Lymphocytes Relative: 21 %
Lymphs Abs: 0.9 10*3/uL (ref 0.7–4.0)
MCH: 20.5 pg — ABNORMAL LOW (ref 26.0–34.0)
MCHC: 31.1 g/dL (ref 30.0–36.0)
MCV: 65.9 fL — ABNORMAL LOW (ref 80.0–100.0)
Monocytes Absolute: 0.6 10*3/uL (ref 0.1–1.0)
Monocytes Relative: 14 %
Neutro Abs: 2.6 10*3/uL (ref 1.7–7.7)
Neutrophils Relative %: 61 %
Platelet Count: 201 10*3/uL (ref 150–400)
RBC: 4.14 MIL/uL (ref 3.87–5.11)
RDW: 24.9 % — ABNORMAL HIGH (ref 11.5–15.5)
WBC Count: 4.3 10*3/uL (ref 4.0–10.5)
nRBC: 1.2 % — ABNORMAL HIGH (ref 0.0–0.2)

## 2019-02-01 MED ORDER — SODIUM CHLORIDE 0.9 % IV SOLN
3150.0000 mg/m2 | INTRAVENOUS | Status: DC
  Administered 2019-02-01: 5000 mg via INTRAVENOUS
  Filled 2019-02-01: qty 100

## 2019-02-01 MED ORDER — LEUCOVORIN CALCIUM INJECTION 350 MG
200.0000 mg/m2 | Freq: Once | INTRAVENOUS | Status: AC
  Administered 2019-02-01: 318 mg via INTRAVENOUS
  Filled 2019-02-01: qty 15.9

## 2019-02-01 MED ORDER — IRINOTECAN HCL CHEMO INJECTION 100 MG/5ML: 150.0000 mg/m2 | Freq: Once | INTRAVENOUS | Status: DC

## 2019-02-01 MED ORDER — PALONOSETRON HCL INJECTION 0.25 MG/5ML
0.2500 mg | Freq: Once | INTRAVENOUS | Status: AC
  Administered 2019-02-01: 0.25 mg via INTRAVENOUS

## 2019-02-01 MED ORDER — SODIUM CHLORIDE 0.9% FLUSH
10.0000 mL | INTRAVENOUS | Status: DC | PRN
  Filled 2019-02-01: qty 10

## 2019-02-01 MED ORDER — SODIUM CHLORIDE 0.9 % IV SOLN
Freq: Once | INTRAVENOUS | Status: AC
  Administered 2019-02-01: 10:00:00 via INTRAVENOUS
  Filled 2019-02-01: qty 5

## 2019-02-01 MED ORDER — PALONOSETRON HCL INJECTION 0.25 MG/5ML
INTRAVENOUS | Status: AC
  Filled 2019-02-01: qty 5

## 2019-02-01 MED ORDER — DEXTROSE 5 % IV SOLN
Freq: Once | INTRAVENOUS | Status: AC
  Administered 2019-02-01: 10:00:00 via INTRAVENOUS
  Filled 2019-02-01: qty 250

## 2019-02-01 MED ORDER — IRINOTECAN HCL CHEMO INJECTION 100 MG/5ML
150.0000 mg/m2 | Freq: Once | INTRAVENOUS | Status: AC
  Administered 2019-02-01: 240 mg via INTRAVENOUS
  Filled 2019-02-01: qty 12

## 2019-02-01 NOTE — Progress Notes (Signed)
[place] OFFICE PROGRESS NOTE   Diagnosis: Rectal cancer  INTERVAL HISTORY:   Tina Morales returns as scheduled.  She reports feeling much better with the week off of chemotherapy.  She has an improved energy level.  She reports abdominal pain  constipation following chemotherapy.  She continues to have neuropathy symptoms in the feet.  No dyspnea.  Objective:  Vital signs in last 24 hours:  Blood pressure 99/75, pulse (!) 104, temperature 98.7 F (37.1 C), temperature source Oral, resp. rate 20, height '5\' 7"'$  (1.702 m), weight 121 lb 12.8 oz (55.2 kg), SpO2 100 %.    Limited physical examination secondary to distancing with the COVID pandemic Resp: Lungs clear bilaterally, no respiratory distress Cardio: Regular rate and rhythm GI: No hepatomegaly, left lower quadrant colostomy Vascular: No leg edema  Skin: Palms without erythema  Portacath/PICC-without erythema  Lab Results:  Lab Results  Component Value Date   WBC 4.3 02/01/2019   HGB 8.5 (L) 02/01/2019   HCT 27.3 (L) 02/01/2019   MCV 65.9 (L) 02/01/2019   PLT 201 02/01/2019   NEUTROABS 2.6 02/01/2019    CMP  Lab Results  Component Value Date   NA 137 02/01/2019   K 4.1 02/01/2019   CL 102 02/01/2019   CO2 25 02/01/2019   GLUCOSE 99 02/01/2019   BUN 7 02/01/2019   CREATININE 0.57 02/01/2019   CALCIUM 8.9 02/01/2019   PROT 6.1 (L) 02/01/2019   ALBUMIN 3.0 (L) 02/01/2019   AST 31 02/01/2019   ALT 13 02/01/2019   ALKPHOS 150 (H) 02/01/2019   BILITOT 0.4 02/01/2019   GFRNONAA >60 02/01/2019   GFRAA >60 02/01/2019    Lab Results  Component Value Date   CEA1 1.84 11/29/2018     Medications: I have reviewed the patient's current medications.   Assessment/Plan: 1. Rectal cancer-rectal mass noted on digital exam 02/10/2018, colonoscopy confirmed a him my circumferential mass in the rectum ? 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 CTs 02/10/2018-anterior rectal wall thickening, pulmonary nodules measuring up to 9 mm concerning for metastases, few round perirectal lymph nodes ? Pelvic MRI 02/20/2018-hypermetabolic low rectal mass extending to the posterior vagina with 2 small enlarged perirectal lymph nodes, T4b,N1-2.3 cm from the anal verge ? PET scan 02/23/2018-hypermetabolic rectal mass, 8 mm hypermetabolic lingular nodule, scattered small bilateral lung nodules, some calcified, a few with mildly increased activity ? Cycle 1 FOLFOXIRI8/21/2019 ? Cycle 5 FOLFOXIRI10/16/2019 ? CTs 05/01/2018-significant interval response to therapy with decreased size of the primary rectal mass lesion. Decreasing perirectal lymphadenopathy. Decreased and/or resolved pulmonary nodules. No new sites of disease identified. ? Cycle 6 FOLFOXIRI10/31/2019 ? Radiation/Xeloda 06/12/2018-completed 07/21/2018 ? Xeloda dose reduced 07/10/2018 due to mucositis and diarrhea ? CTs 08/07/2018-compared to 02/10/2018-resolved and decreased pulmonary nodules, few tiny residual noncalcified nodules, decreased 20 perirectal lymph nodes, soft tissue of the rectum indistinguishable from posterior wall of vagina-Vaginal involvement by tumor? ? 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 CT chest 09/29/2018-enlargement of lung nodules, not a candidate for SBRT based on discussion in GI tumor conference and with radiation oncology ? CT chest 11/27/18 - interval growth of numerous pulmonary metastases bilaterally compared to 09/29/18, no new  pulmonary mets ? Cycle  1FOLFOXIRI 11/29/18 ? Cycle 2 FOLFOXIRI6/10/2018 ? Cycle 3 FOLFOXIRI 12/28/2018 ? Cycle 4 FOLFIRINOX 01/11/2019 ? CTs 01/24/2019-significant improvement of bilateral pulmonary metastases. ? 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 Imodium as needed. 6.Mucositis secondary to Xeloda. Improved 07/19/2018. 7. History of migraines    Disposition: Tina Morales has an improved performance status today.  The plan is to proceed with chemotherapy.  Treatment will be changed to FOLFIRI on a 3-week schedule.  She will begin a bowel regimen for treatment of constipation.  She will return for an office visit in the next cycle of chemotherapy in 3 weeks.  Betsy Coder, MD  02/01/2019  8:56 AM

## 2019-02-01 NOTE — Telephone Encounter (Signed)
Gave avs and calendar ° °

## 2019-02-01 NOTE — Progress Notes (Signed)
Tx plan changed to "FOLFIRI" using a "FOLFOXIRI" tx plan.  After discussing w/ Dr. Benay Spice, will change Irinotecan infusion rate to 90 min.  Leucovorin will also infuse at 90 min.  Discussed changing premeds to Aloxi + Dex but pt still having nausea w/o vomiting.  We agreed to continue Emend IV.  Pt continues to have constipation.  MD requested we take the atropine order out of the tx plan.  He'll add back in if pt develops diarrhea/cramping.  Kennith Center, Pharm.D., CPP 02/01/2019@9 :53 AM

## 2019-02-01 NOTE — Progress Notes (Signed)
5FU pump rate will be 5.4 mL/hr to infuse over 46 hours. Order is for 48 hour pump; MD changed from "FOLFOXIRI" tx plan into a "FOLFIRI" tx plan.  In FOLFIRI, 5FU is infused over 46 hour.  Future orders also changed accordingly.  Kennith Center, Pharm.D., CPP 02/01/2019@1 :02 PM

## 2019-02-01 NOTE — Progress Notes (Signed)
Per Dr. Benay Spice: OK to tx with pulse 104 and Hgb 8.5

## 2019-02-01 NOTE — Patient Instructions (Signed)
[facility] Discharge Instructions for Patients Receiving Chemotherapy  Today you received the following chemotherapy agents Irinotecan (CAMPTOSAR), Leucovorin & Flourouracil (ADRUCIL).  To help prevent nausea and vomiting after your treatment, we encourage you to take your nausea medication as prescribed.    If you develop nausea and vomiting that is not controlled by your nausea medication, call the clinic.   BELOW ARE SYMPTOMS THAT SHOULD BE REPORTED IMMEDIATELY:  *FEVER GREATER THAN 100.5 F  *CHILLS WITH OR WITHOUT FEVER  NAUSEA AND VOMITING THAT IS NOT CONTROLLED WITH YOUR NAUSEA MEDICATION  *UNUSUAL SHORTNESS OF BREATH  *UNUSUAL BRUISING OR BLEEDING  TENDERNESS IN MOUTH AND THROAT WITH OR WITHOUT PRESENCE OF ULCERS  *URINARY PROBLEMS  *BOWEL PROBLEMS  UNUSUAL RASH Items with * indicate a potential emergency and should be followed up as soon as possible.  Feel free to call the clinic should you have any questions or concerns. The clinic phone number is [phone].  Please show the CHEMO ALERT CARD at check-in to the Emergency Department and triage nurse.   

## 2019-02-03 ENCOUNTER — Inpatient Hospital Stay: Payer: BC Managed Care – PPO

## 2019-02-03 ENCOUNTER — Other Ambulatory Visit: Payer: Self-pay

## 2019-02-03 VITALS — BP 121/85 | HR 92 | Temp 98.9°F | Resp 16

## 2019-02-03 DIAGNOSIS — C20 Malignant neoplasm of rectum: Secondary | ICD-10-CM

## 2019-02-03 DIAGNOSIS — Z5111 Encounter for antineoplastic chemotherapy: Secondary | ICD-10-CM | POA: Diagnosis not present

## 2019-02-03 MED ORDER — SODIUM CHLORIDE 0.9% FLUSH
10.0000 mL | INTRAVENOUS | Status: DC | PRN
  Administered 2019-02-03: 11:00:00 10 mL
  Filled 2019-02-03: qty 10

## 2019-02-03 MED ORDER — HEPARIN SOD (PORK) LOCK FLUSH 100 UNIT/ML IV SOLN
500.0000 [IU] | Freq: Once | INTRAVENOUS | Status: AC | PRN
  Administered 2019-02-03: 11:00:00 500 [IU]
  Filled 2019-02-03: qty 5

## 2019-02-17 ENCOUNTER — Other Ambulatory Visit: Payer: Self-pay | Admitting: Oncology

## 2019-02-19 ENCOUNTER — Other Ambulatory Visit: Payer: Self-pay | Admitting: *Deleted

## 2019-02-19 DIAGNOSIS — C20 Malignant neoplasm of rectum: Secondary | ICD-10-CM

## 2019-02-19 MED ORDER — ALPRAZOLAM 0.5 MG PO TABS: 0.5000 mg | ORAL_TABLET | Freq: Two times a day (BID) | ORAL | 0 refills | Status: DC | PRN

## 2019-02-19 NOTE — Telephone Encounter (Signed)
Patient called to request refill on xanax. Refill called to automated line of pharmacy.

## 2019-02-22 ENCOUNTER — Encounter: Payer: Self-pay | Admitting: Nurse Practitioner

## 2019-02-22 ENCOUNTER — Other Ambulatory Visit: Payer: Self-pay

## 2019-02-22 ENCOUNTER — Inpatient Hospital Stay: Payer: BC Managed Care – PPO | Attending: Oncology | Admitting: Nurse Practitioner

## 2019-02-22 ENCOUNTER — Inpatient Hospital Stay: Payer: BC Managed Care – PPO

## 2019-02-22 VITALS — BP 119/83 | HR 104 | Temp 98.7°F | Resp 18 | Ht 67.0 in | Wt 123.0 lb

## 2019-02-22 DIAGNOSIS — C20 Malignant neoplasm of rectum: Secondary | ICD-10-CM | POA: Diagnosis not present

## 2019-02-22 DIAGNOSIS — Z5111 Encounter for antineoplastic chemotherapy: Secondary | ICD-10-CM | POA: Insufficient documentation

## 2019-02-22 DIAGNOSIS — Z87891 Personal history of nicotine dependence: Secondary | ICD-10-CM | POA: Diagnosis not present

## 2019-02-22 DIAGNOSIS — Z923 Personal history of irradiation: Secondary | ICD-10-CM | POA: Diagnosis not present

## 2019-02-22 DIAGNOSIS — D649 Anemia, unspecified: Secondary | ICD-10-CM | POA: Diagnosis not present

## 2019-02-22 DIAGNOSIS — Z95828 Presence of other vascular implants and grafts: Secondary | ICD-10-CM

## 2019-02-22 LAB — CBC WITH DIFFERENTIAL (CANCER CENTER ONLY)
Abs Immature Granulocytes: 0.08 10*3/uL — ABNORMAL HIGH (ref 0.00–0.07)
Basophils Absolute: 0 10*3/uL (ref 0.0–0.1)
Basophils Relative: 0 %
Eosinophils Absolute: 0.1 10*3/uL (ref 0.0–0.5)
Eosinophils Relative: 1 %
HCT: 30 % — ABNORMAL LOW (ref 36.0–46.0)
Hemoglobin: 9.1 g/dL — ABNORMAL LOW (ref 12.0–15.0)
Immature Granulocytes: 2 %
Lymphocytes Relative: 26 %
Lymphs Abs: 1.3 10*3/uL (ref 0.7–4.0)
MCH: 21.4 pg — ABNORMAL LOW (ref 26.0–34.0)
MCHC: 30.3 g/dL (ref 30.0–36.0)
MCV: 70.4 fL — ABNORMAL LOW (ref 80.0–100.0)
Monocytes Absolute: 0.6 10*3/uL (ref 0.1–1.0)
Monocytes Relative: 11 %
Neutro Abs: 3.1 10*3/uL (ref 1.7–7.7)
Neutrophils Relative %: 60 %
Platelet Count: 230 10*3/uL (ref 150–400)
RBC: 4.26 MIL/uL (ref 3.87–5.11)
RDW: 25.2 % — ABNORMAL HIGH (ref 11.5–15.5)
WBC Count: 5.2 10*3/uL (ref 4.0–10.5)
nRBC: 0 % (ref 0.0–0.2)

## 2019-02-22 LAB — CMP (CANCER CENTER ONLY)
ALT: 13 U/L (ref 0–44)
AST: 26 U/L (ref 15–41)
Albumin: 3.5 g/dL (ref 3.5–5.0)
Alkaline Phosphatase: 156 U/L — ABNORMAL HIGH (ref 38–126)
Anion gap: 9 (ref 5–15)
BUN: 6 mg/dL (ref 6–20)
CO2: 26 mmol/L (ref 22–32)
Calcium: 9.3 mg/dL (ref 8.9–10.3)
Chloride: 102 mmol/L (ref 98–111)
Creatinine: 0.6 mg/dL (ref 0.44–1.00)
GFR, Est AFR Am: >60 mL/min (ref >60)
GFR, Estimated: >60 mL/min (ref >60)
Glucose, Bld: 92 mg/dL (ref 70–99)
Potassium: 4.7 mmol/L (ref 3.5–5.1)
Sodium: 137 mmol/L (ref 135–145)
Total Bilirubin: 0.5 mg/dL (ref 0.3–1.2)
Total Protein: 6.4 g/dL — ABNORMAL LOW (ref 6.5–8.1)

## 2019-02-22 LAB — MAGNESIUM: Magnesium: 2 mg/dL (ref 1.7–2.4)

## 2019-02-22 MED ORDER — SODIUM CHLORIDE 0.9% FLUSH
10.0000 mL | Freq: Once | INTRAVENOUS | Status: AC
  Administered 2019-02-22: 10 mL
  Filled 2019-02-22: qty 10

## 2019-02-22 MED ORDER — ALTEPLASE 2 MG IJ SOLR
2.0000 mg | Freq: Once | INTRAMUSCULAR | Status: AC
  Administered 2019-02-22: 2 mg
  Filled 2019-02-22: qty 2

## 2019-02-22 MED ORDER — SODIUM CHLORIDE 0.9 % IV SOLN
Freq: Once | INTRAVENOUS | Status: AC
  Administered 2019-02-22: 12:00:00 via INTRAVENOUS
  Filled 2019-02-22: qty 5

## 2019-02-22 MED ORDER — PALONOSETRON HCL INJECTION 0.25 MG/5ML
0.2500 mg | Freq: Once | INTRAVENOUS | Status: AC
  Administered 2019-02-22: 0.25 mg via INTRAVENOUS

## 2019-02-22 MED ORDER — PALONOSETRON HCL INJECTION 0.25 MG/5ML
INTRAVENOUS | Status: AC
  Filled 2019-02-22: qty 5

## 2019-02-22 MED ORDER — ALTEPLASE 2 MG IJ SOLR
INTRAMUSCULAR | Status: AC
  Filled 2019-02-22: qty 2

## 2019-02-22 MED ORDER — IRINOTECAN HCL CHEMO INJECTION 100 MG/5ML
150.0000 mg/m2 | Freq: Once | INTRAVENOUS | Status: AC
  Administered 2019-02-22: 240 mg via INTRAVENOUS
  Filled 2019-02-22: qty 12

## 2019-02-22 MED ORDER — SODIUM CHLORIDE 0.9 % IV SOLN
3150.0000 mg/m2 | INTRAVENOUS | Status: DC
  Administered 2019-02-22: 5000 mg via INTRAVENOUS
  Filled 2019-02-22: qty 100

## 2019-02-22 MED ORDER — DEXTROSE 5 % IV SOLN
Freq: Once | INTRAVENOUS | Status: AC
  Administered 2019-02-22: 12:00:00 via INTRAVENOUS
  Filled 2019-02-22: qty 250

## 2019-02-22 MED ORDER — SODIUM CHLORIDE 0.9% FLUSH
10.0000 mL | INTRAVENOUS | Status: DC | PRN
  Filled 2019-02-22: qty 10

## 2019-02-22 MED ORDER — LEUCOVORIN CALCIUM INJECTION 350 MG
200.0000 mg/m2 | Freq: Once | INTRAVENOUS | Status: AC
  Administered 2019-02-22: 318 mg via INTRAVENOUS
  Filled 2019-02-22: qty 15.9

## 2019-02-22 NOTE — Patient Instructions (Signed)
[facility] Discharge Instructions for Patients Receiving Chemotherapy  Today you received the following chemotherapy agents: Irinotecan, Fluorouracil pump   To help prevent nausea and vomiting after your treatment, we encourage you to take your nausea medication as directed.    If you develop nausea and vomiting that is not controlled by your nausea medication, call the clinic.   BELOW ARE SYMPTOMS THAT SHOULD BE REPORTED IMMEDIATELY:  *FEVER GREATER THAN 100.5 F  *CHILLS WITH OR WITHOUT FEVER  NAUSEA AND VOMITING THAT IS NOT CONTROLLED WITH YOUR NAUSEA MEDICATION  *UNUSUAL SHORTNESS OF BREATH  *UNUSUAL BRUISING OR BLEEDING  TENDERNESS IN MOUTH AND THROAT WITH OR WITHOUT PRESENCE OF ULCERS  *URINARY PROBLEMS  *BOWEL PROBLEMS  UNUSUAL RASH Items with * indicate a potential emergency and should be followed up as soon as possible.  Feel free to call the clinic should you have any questions or concerns. The clinic phone number is [phone].  Please show the [facility] at check-in to the Emergency Department and triage nurse.

## 2019-02-22 NOTE — Progress Notes (Signed)
[facility] OFFICE PROGRESS NOTE   Diagnosis: Rectal cancer  INTERVAL HISTORY:   Tina Morales returns as scheduled.  She completed a cycle of FOLFIRI 02/01/2019.  She noted improved tolerance of the chemotherapy.  She had nausea, body aches, low-grade fever for a few days.  All symptoms were less severe than with previous cycles.  No diarrhea.  Objective:  Vital signs in last 24 hours:  Blood pressure 119/83, pulse (!) 112, temperature 98.7 F (37.1 C), temperature source Oral, resp. rate 18, height 5' 7"  (1.702 m), weight 123 lb (55.8 kg), SpO2 98 %.    HEENT: No thrush or ulcers. GI: Abdomen soft and nontender.  No hepatomegaly.  Left lower quadrant colostomy. Vascular: No leg edema.  Calves soft and nontender. Neuro: Alert and oriented. Skin: Palms without erythema. Port-A-Cath without erythema.   Lab Results:  Lab Results  Component Value Date   WBC 5.2 02/22/2019   HGB 9.1 (L) 02/22/2019   HCT 30.0 (L) 02/22/2019   MCV 70.4 (L) 02/22/2019   PLT 230 02/22/2019   NEUTROABS 3.1 02/22/2019    Imaging:  No results found.  Medications: I have reviewed the patient's current medications.  Assessment/Plan: 1. Rectal cancer-rectal mass noted on digital exam 02/10/2018, colonoscopy confirmed a him my circumferential mass in the rectum ? 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 CTs 02/10/2018-anterior rectal wall thickening, pulmonary nodules measuring up to 9 mm concerning for metastases, few round perirectal lymph nodes ? Pelvic MRI 02/20/2018-hypermetabolic low rectal mass extending to the posterior vagina with 2 small enlarged perirectal lymph nodes, T4b,N1-2.3 cm from the anal verge ? PET scan 02/23/2018-hypermetabolic rectal  mass, 8 mm hypermetabolic lingular nodule, scattered small bilateral lung nodules, some calcified, a few with mildly increased activity ? Cycle 1 FOLFOXIRI8/21/2019 ? Cycle 5 FOLFOXIRI10/16/2019 ? CTs 05/01/2018-significant interval response to therapy with decreased size of the primary rectal mass lesion. Decreasing perirectal lymphadenopathy. Decreased and/or resolved pulmonary nodules. No new sites of disease identified. ? Cycle 6 FOLFOXIRI10/31/2019 ? Radiation/Xeloda 06/12/2018-completed 07/21/2018 ? Xeloda dose reduced 07/10/2018 due to mucositis and diarrhea ? CTs 08/07/2018-compared to 02/10/2018-resolved and decreased pulmonary nodules, few tiny residual noncalcified nodules, decreased 20 perirectal lymph nodes, soft tissue of the rectum indistinguishable from posterior wall of vagina-Vaginal involvement by tumor? ? 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 CT chest 09/29/2018-enlargement of lung nodules, not a candidate for SBRT based on discussion in GI tumor conference and with radiation oncology ? CT chest 11/27/18 - interval growth of numerous pulmonary metastases bilaterally compared to 09/29/18, no new pulmonary mets ? Cycle 1FOLFOXIRI 11/29/18 ? Cycle 2 FOLFOXIRI6/10/2018 ? Cycle 3 FOLFOXIRI 12/28/2018 ? Cycle 4 FOLFIRINOX 01/11/2019 ? CTs 01/24/2019-significant improvement of bilateral pulmonary metastases. ? Cycle 1 FOLFIRI 02/01/2019 ? 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 Imodium as needed. 6.Mucositis secondary to Xeloda. Improved 07/19/2018.  7. History of migraines   Disposition: Tina Morales appears stable.  She notes improved tolerance of the chemotherapy.  Plan to continue FOLFIRI every 3 weeks with treatment today.  We reviewed the CBC and chemistry panel from today, adequate for treatment.  She will return for lab, follow-up, FOLFIRI in 3 weeks.  She will contact the office in the interim with any problems.    Ned Card ANP/GNP-BC   02/22/2019  11:04 AM

## 2019-02-22 NOTE — Patient Instructions (Signed)

## 2019-02-22 NOTE — Progress Notes (Signed)
No blood return noted [name], RN gave PT CATHFLO at 1005. Quinntin Malter R, LPN drew blood peripherally.

## 2019-02-23 ENCOUNTER — Telehealth: Payer: Self-pay | Admitting: Nurse Practitioner

## 2019-02-23 NOTE — Telephone Encounter (Signed)
Called and spoke with patient. Confirmed 9/3 appt

## 2019-02-24 ENCOUNTER — Other Ambulatory Visit: Payer: Self-pay

## 2019-02-24 ENCOUNTER — Inpatient Hospital Stay: Payer: BC Managed Care – PPO

## 2019-02-24 VITALS — BP 100/82 | HR 88 | Temp 99.1°F | Resp 16

## 2019-02-24 DIAGNOSIS — C20 Malignant neoplasm of rectum: Secondary | ICD-10-CM | POA: Diagnosis not present

## 2019-02-24 MED ORDER — HEPARIN SOD (PORK) LOCK FLUSH 100 UNIT/ML IV SOLN
500.0000 [IU] | Freq: Once | INTRAVENOUS | Status: AC | PRN
  Administered 2019-02-24: 13:00:00 500 [IU]
  Filled 2019-02-24: qty 5

## 2019-02-24 MED ORDER — SODIUM CHLORIDE 0.9% FLUSH
10.0000 mL | INTRAVENOUS | Status: DC | PRN
  Administered 2019-02-24: 10 mL
  Filled 2019-02-24: qty 10

## 2019-03-06 ENCOUNTER — Telehealth: Payer: Self-pay | Admitting: *Deleted

## 2019-03-06 NOTE — Telephone Encounter (Signed)
Patient called and stated "last time I was Dr. Denman George she had mention to start using a dilator and some cream. I wanted to get with her and get that started. She stated that I could just come the office and pick it up." Explained that Dr. Denman George and Lenna Sciara APP are in the OR today and that we will try to call her back tomorrow. Patient has not had a pelvic exam since March.

## 2019-03-08 ENCOUNTER — Encounter: Payer: Self-pay | Admitting: Oncology

## 2019-03-08 ENCOUNTER — Telehealth: Payer: Self-pay | Admitting: Oncology

## 2019-03-08 NOTE — Telephone Encounter (Signed)

## 2019-03-08 NOTE — Telephone Encounter (Signed)
Called Tina Morales and advised her that we will provide her with a dilator and instructions when she is at the [facility] on 03/15/19.  She verbalized understanding and agreement.

## 2019-03-10 ENCOUNTER — Other Ambulatory Visit: Payer: Self-pay | Admitting: Oncology

## 2019-03-13 ENCOUNTER — Other Ambulatory Visit: Payer: Self-pay | Admitting: Gynecologic Oncology

## 2019-03-13 ENCOUNTER — Telehealth: Payer: Self-pay | Admitting: *Deleted

## 2019-03-13 DIAGNOSIS — C20 Malignant neoplasm of rectum: Secondary | ICD-10-CM

## 2019-03-13 MED ORDER — ESTROGENS, CONJUGATED 0.625 MG/GM VA CREA: 1.0000 | TOPICAL_CREAM | VAGINAL | 1 refills | Status: DC

## 2019-03-13 NOTE — Progress Notes (Signed)
Pt called and will be picking up her dilator.  Asking about using the premarin cream that was recommended in Feb 2020 of this year.  Patient did not come to her one month appt with Dr. Denman George earlier of this year.  Per Dr. Denman George, she can try to premarin cream three times a week.

## 2019-03-13 NOTE — Telephone Encounter (Signed)
Patient called and is requesting a vaginal cream that Dr. Denman George recommended

## 2019-03-15 ENCOUNTER — Inpatient Hospital Stay: Payer: BC Managed Care – PPO

## 2019-03-15 ENCOUNTER — Encounter: Payer: Self-pay | Admitting: Oncology

## 2019-03-15 ENCOUNTER — Other Ambulatory Visit: Payer: Self-pay

## 2019-03-15 ENCOUNTER — Inpatient Hospital Stay: Payer: BC Managed Care – PPO | Attending: Oncology | Admitting: Oncology

## 2019-03-15 VITALS — BP 115/82 | HR 122 | Temp 99.1°F | Resp 17 | Ht 67.0 in | Wt 124.6 lb

## 2019-03-15 VITALS — HR 103

## 2019-03-15 DIAGNOSIS — C7802 Secondary malignant neoplasm of left lung: Secondary | ICD-10-CM | POA: Insufficient documentation

## 2019-03-15 DIAGNOSIS — C7801 Secondary malignant neoplasm of right lung: Secondary | ICD-10-CM | POA: Insufficient documentation

## 2019-03-15 DIAGNOSIS — C20 Malignant neoplasm of rectum: Secondary | ICD-10-CM

## 2019-03-15 DIAGNOSIS — Z5111 Encounter for antineoplastic chemotherapy: Secondary | ICD-10-CM | POA: Insufficient documentation

## 2019-03-15 DIAGNOSIS — Z95828 Presence of other vascular implants and grafts: Secondary | ICD-10-CM

## 2019-03-15 LAB — CBC WITH DIFFERENTIAL (CANCER CENTER ONLY)
Abs Immature Granulocytes: 0.1 10*3/uL — ABNORMAL HIGH (ref 0.00–0.07)
Basophils Absolute: 0 10*3/uL (ref 0.0–0.1)
Basophils Relative: 0 %
Eosinophils Absolute: 0.1 10*3/uL (ref 0.0–0.5)
Eosinophils Relative: 1 %
HCT: 31.6 % — ABNORMAL LOW (ref 36.0–46.0)
Hemoglobin: 9.5 g/dL — ABNORMAL LOW (ref 12.0–15.0)
Immature Granulocytes: 2 %
Lymphocytes Relative: 13 %
Lymphs Abs: 0.9 10*3/uL (ref 0.7–4.0)
MCH: 21.7 pg — ABNORMAL LOW (ref 26.0–34.0)
MCHC: 30.1 g/dL (ref 30.0–36.0)
MCV: 72.3 fL — ABNORMAL LOW (ref 80.0–100.0)
Monocytes Absolute: 0.6 10*3/uL (ref 0.1–1.0)
Monocytes Relative: 9 %
Neutro Abs: 5.1 10*3/uL (ref 1.7–7.7)
Neutrophils Relative %: 75 %
Platelet Count: 350 10*3/uL (ref 150–400)
RBC: 4.37 MIL/uL (ref 3.87–5.11)
RDW: 19.7 % — ABNORMAL HIGH (ref 11.5–15.5)
WBC Count: 6.8 10*3/uL (ref 4.0–10.5)
nRBC: 0 % (ref 0.0–0.2)

## 2019-03-15 LAB — CMP (CANCER CENTER ONLY)
ALT: 9 U/L (ref 0–44)
AST: 19 U/L (ref 15–41)
Albumin: 3.5 g/dL (ref 3.5–5.0)
Alkaline Phosphatase: 189 U/L — ABNORMAL HIGH (ref 38–126)
Anion gap: 8 (ref 5–15)
BUN: 8 mg/dL (ref 6–20)
CO2: 25 mmol/L (ref 22–32)
Calcium: 9.1 mg/dL (ref 8.9–10.3)
Chloride: 102 mmol/L (ref 98–111)
Creatinine: 0.61 mg/dL (ref 0.44–1.00)
GFR, Est AFR Am: >60 mL/min (ref >60)
GFR, Estimated: >60 mL/min (ref >60)
Glucose, Bld: 95 mg/dL (ref 70–99)
Potassium: 4.8 mmol/L (ref 3.5–5.1)
Sodium: 135 mmol/L (ref 135–145)
Total Bilirubin: 0.5 mg/dL (ref 0.3–1.2)
Total Protein: 6.7 g/dL (ref 6.5–8.1)

## 2019-03-15 LAB — MAGNESIUM: Magnesium: 2.2 mg/dL (ref 1.7–2.4)

## 2019-03-15 LAB — CEA (IN HOUSE-CHCC): CEA (CHCC-In House): 1.71 ng/mL (ref 0.00–5.00)

## 2019-03-15 MED ORDER — PALONOSETRON HCL INJECTION 0.25 MG/5ML
0.2500 mg | Freq: Once | INTRAVENOUS | Status: AC
  Administered 2019-03-15: 0.25 mg via INTRAVENOUS

## 2019-03-15 MED ORDER — IRINOTECAN HCL CHEMO INJECTION 100 MG/5ML
150.0000 mg/m2 | Freq: Once | INTRAVENOUS | Status: AC
  Administered 2019-03-15: 240 mg via INTRAVENOUS
  Filled 2019-03-15: qty 12

## 2019-03-15 MED ORDER — SODIUM CHLORIDE 0.9 % IV SOLN
3150.0000 mg/m2 | INTRAVENOUS | Status: DC
  Administered 2019-03-15: 13:00:00 5000 mg via INTRAVENOUS
  Filled 2019-03-15: qty 100

## 2019-03-15 MED ORDER — DEXTROSE 5 % IV SOLN
Freq: Once | INTRAVENOUS | Status: AC
  Administered 2019-03-15: 10:00:00 via INTRAVENOUS
  Filled 2019-03-15: qty 250

## 2019-03-15 MED ORDER — LEUCOVORIN CALCIUM INJECTION 350 MG
200.0000 mg/m2 | Freq: Once | INTRAVENOUS | Status: AC
  Administered 2019-03-15: 318 mg via INTRAVENOUS
  Filled 2019-03-15: qty 15.9

## 2019-03-15 MED ORDER — ALTEPLASE 2 MG IJ SOLR
2.0000 mg | Freq: Once | INTRAMUSCULAR | Status: AC
  Administered 2019-03-15: 09:00:00 2 mg
  Filled 2019-03-15: qty 2

## 2019-03-15 MED ORDER — SODIUM CHLORIDE 0.9 % IV SOLN
Freq: Once | INTRAVENOUS | Status: AC
  Administered 2019-03-15: 11:00:00 via INTRAVENOUS
  Filled 2019-03-15: qty 5

## 2019-03-15 MED ORDER — SODIUM CHLORIDE 0.9% FLUSH
10.0000 mL | Freq: Once | INTRAVENOUS | Status: AC
  Administered 2019-03-15: 09:00:00 10 mL
  Filled 2019-03-15: qty 10

## 2019-03-15 MED ORDER — PALONOSETRON HCL INJECTION 0.25 MG/5ML
INTRAVENOUS | Status: AC
  Filled 2019-03-15: qty 5

## 2019-03-15 MED ORDER — ALTEPLASE 2 MG IJ SOLR
INTRAMUSCULAR | Status: AC
  Filled 2019-03-15: qty 2

## 2019-03-15 NOTE — Patient Instructions (Signed)
[facility] Discharge Instructions for Patients Receiving Chemotherapy  Today you received the following chemotherapy agents: Irinotecan, Fluorouracil pump   To help prevent nausea and vomiting after your treatment, we encourage you to take your nausea medication as directed.    If you develop nausea and vomiting that is not controlled by your nausea medication, call the clinic.   BELOW ARE SYMPTOMS THAT SHOULD BE REPORTED IMMEDIATELY:  *FEVER GREATER THAN 100.5 F  *CHILLS WITH OR WITHOUT FEVER  NAUSEA AND VOMITING THAT IS NOT CONTROLLED WITH YOUR NAUSEA MEDICATION  *UNUSUAL SHORTNESS OF BREATH  *UNUSUAL BRUISING OR BLEEDING  TENDERNESS IN MOUTH AND THROAT WITH OR WITHOUT PRESENCE OF ULCERS  *URINARY PROBLEMS  *BOWEL PROBLEMS  UNUSUAL RASH Items with * indicate a potential emergency and should be followed up as soon as possible.  Feel free to call the clinic should you have any questions or concerns. The clinic phone number is [phone].  Please show the CHEMO ALERT CARD at check-in to the Emergency Department and triage nurse.   

## 2019-03-15 NOTE — Progress Notes (Signed)
Met with Tina Morales in the infusion room.  She was given a size S and M vaginal dilators and was instructed on how to use them.  She was also proved with written instructions.

## 2019-03-15 NOTE — Progress Notes (Signed)
CATHFLO given by Yetta Glassman, RN at [phone] and blood drawn peripherally by Leontine Locket, LPN

## 2019-03-15 NOTE — Progress Notes (Signed)
Per Dr. Benay Spice: OK to treat today with pulse 122. She does run tachy. Will recheck in infusion today after she has had time to rest.

## 2019-03-15 NOTE — Progress Notes (Signed)
[place] OFFICE PROGRESS NOTE   Diagnosis: Rectal cancer  INTERVAL HISTORY:   [name] completed no cycle FOLFIRI on 02/22/2019.  She developed abdominal cramping during the day 1 chemotherapy.  She does not wish to take atropine secondary to constipation.  She continues to have constipation, relieved with MiraLAX.  She had increased "heartburn" following this cycle of chemotherapy.  She also reports insomnia.  Mouth sores were helped with Magic mouthwash. She has noted improved energy level since switching to the every 3-week chemotherapy regimen. Objective:  Vital signs in last 24 hours:  Blood pressure 115/82, pulse (!) 122, temperature 99.1 F (37.3 C), temperature source Temporal, resp. rate 17, height _0  (1.702 m), weight 124 lb 9.6 oz (56.5 kg), SpO2 100 %.    HEENT: No thrush or ulcers Resp: Lungs clear bilaterally Cardio: Regular rate and rhythm, tachycardia GI: No hepatomegaly, left lower quadrant colostomy, nontender Vascular: No leg edema    Portacath/PICC-without erythema  Lab Results:  Lab Results  Component Value Date   WBC 6.8 03/15/2019   HGB 9.5 (L) 03/15/2019   HCT 31.6 (L) 03/15/2019   MCV 72.3 (L) 03/15/2019   PLT 350 03/15/2019   NEUTROABS 5.1 03/15/2019    CMP  Lab Results  Component Value Date   NA 135 03/15/2019   K 4.8 03/15/2019   CL 102 03/15/2019   CO2 25 03/15/2019   GLUCOSE 95 03/15/2019   BUN 8 03/15/2019   CREATININE 0.61 03/15/2019   CALCIUM 9.1 03/15/2019   PROT 6.7 03/15/2019   ALBUMIN 3.5 03/15/2019   AST 19 03/15/2019   ALT 9 03/15/2019   ALKPHOS 189 (H) 03/15/2019   BILITOT 0.5 03/15/2019   GFRNONAA >60 03/15/2019   GFRAA >60 03/15/2019    Lab Results  Component Value Date   CEA1 1.71 03/15/2019     Medications: I have reviewed the patient's current medications.   Assessment/Plan: 1. Rectal cancer-rectal mass noted on digital exam 02/10/2018, colonoscopy confirmed a him my circumferential  mass in the rectum ? 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 CTs 02/10/2018-anterior rectal wall thickening, pulmonary nodules measuring up to 9 mm concerning for metastases, few round perirectal lymph nodes ? Pelvic MRI 02/20/2018-hypermetabolic low rectal mass extending to the posterior vagina with 2 small enlarged perirectal lymph nodes, T4b,N1-2.3 cm from the anal verge ? PET scan 02/23/2018-hypermetabolic rectal mass, 8 mm hypermetabolic lingular nodule, scattered small bilateral lung nodules, some calcified, a few with mildly increased activity ? Cycle 1 FOLFOXIRI8/21/2019 ? Cycle 5 FOLFOXIRI10/16/2019 ? CTs 05/01/2018-significant interval response to therapy with decreased size of the primary rectal mass lesion. Decreasing perirectal lymphadenopathy. Decreased and/or resolved pulmonary nodules. No new sites of disease identified. ? Cycle 6 FOLFOXIRI10/31/2019 ? Radiation/Xeloda 06/12/2018-completed 07/21/2018 ? Xeloda dose reduced 07/10/2018 due to mucositis and diarrhea ? CTs 08/07/2018-compared to 02/10/2018-resolved and decreased pulmonary nodules, few tiny residual noncalcified nodules, decreased 20 perirectal lymph nodes, soft tissue of the rectum indistinguishable from posterior wall of vagina-Vaginal involvement by tumor? ? 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 CT chest 09/29/2018-enlargement of lung nodules, not a candidate for SBRT based on discussion in GI tumor conference and with radiation oncology ?  CT chest 11/27/18 -  interval growth of numerous pulmonary metastases bilaterally compared to 09/29/18, no new pulmonary mets ? Cycle 1FOLFOXIRI 11/29/18 ? Cycle 2 FOLFOXIRI6/10/2018 ? Cycle 3 FOLFOXIRI 12/28/2018 ? Cycle 4 FOLFIRINOX 01/11/2019 ? CTs 01/24/2019-significant improvement of bilateral pulmonary metastases. ? Cycle 1 FOLFIRI 02/01/2019 ? Cycle 2 FOLFIRI 02/22/2019 ? 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 Imodium as needed. 6.Mucositis secondary to Xeloda. Improved 07/19/2018. 7. History of migraines   Disposition: Tina Morales appears stable.  She will complete another cycle of FOLFIRI today.  She will return for office visit and chemotherapy in 3 weeks.  The plan is to complete 5 cycles of FOLFIRI prior to a restaging CT evaluation.  We will consider switching to maintenance Xeloda/Avastin after the next CT scans.    Betsy Coder, MD  03/15/2019  1:54 PM

## 2019-03-15 NOTE — Patient Instructions (Signed)

## 2019-03-17 ENCOUNTER — Other Ambulatory Visit: Payer: Self-pay

## 2019-03-17 ENCOUNTER — Inpatient Hospital Stay: Payer: BC Managed Care – PPO

## 2019-03-17 VITALS — BP 112/72 | HR 78 | Temp 98.5°F | Resp 16

## 2019-03-17 DIAGNOSIS — C20 Malignant neoplasm of rectum: Secondary | ICD-10-CM

## 2019-03-17 MED ORDER — HEPARIN SOD (PORK) LOCK FLUSH 100 UNIT/ML IV SOLN
500.0000 [IU] | Freq: Once | INTRAVENOUS | Status: AC | PRN
  Administered 2019-03-17: 11:00:00 500 [IU]
  Filled 2019-03-17: qty 5

## 2019-03-17 MED ORDER — SODIUM CHLORIDE 0.9% FLUSH
10.0000 mL | INTRAVENOUS | Status: DC | PRN
  Administered 2019-03-17: 11:00:00 10 mL
  Filled 2019-03-17: qty 10

## 2019-03-20 ENCOUNTER — Telehealth: Payer: Self-pay | Admitting: *Deleted

## 2019-03-20 NOTE — Telephone Encounter (Signed)
Left VM that she sees infusion on Mychart on 9/23 with no other appointments for provider or lab. Also wants appointments on 9/24 instead of 9/23. Scheduling message sent.

## 2019-03-21 ENCOUNTER — Telehealth: Payer: Self-pay | Admitting: Oncology

## 2019-03-21 NOTE — Telephone Encounter (Signed)
Called and spoke with patient. Confirmed upcoming appts  

## 2019-03-22 ENCOUNTER — Telehealth: Payer: Self-pay | Admitting: *Deleted

## 2019-03-22 NOTE — Telephone Encounter (Signed)
Received a call from a nurse at Dr. Orest Dikes office asking if the patient needs a follow from Dr. Denman George. Explained that she was in the OR today that I would find out and call her tomorrow

## 2019-03-23 ENCOUNTER — Telehealth: Payer: Self-pay | Admitting: *Deleted

## 2019-03-23 DIAGNOSIS — C20 Malignant neoplasm of rectum: Secondary | ICD-10-CM

## 2019-03-23 MED ORDER — ALPRAZOLAM 0.5 MG PO TABS: 0.5000 mg | ORAL_TABLET | Freq: Two times a day (BID) | ORAL | 0 refills | Status: DC | PRN

## 2019-03-23 NOTE — Telephone Encounter (Signed)
Left VM requesting refill on xanax. Forgot to request it at last OV>

## 2019-04-01 ENCOUNTER — Other Ambulatory Visit: Payer: Self-pay | Admitting: Oncology

## 2019-04-04 ENCOUNTER — Ambulatory Visit: Payer: BC Managed Care – PPO

## 2019-04-05 ENCOUNTER — Inpatient Hospital Stay: Payer: BC Managed Care – PPO

## 2019-04-05 ENCOUNTER — Other Ambulatory Visit: Payer: Self-pay

## 2019-04-05 ENCOUNTER — Inpatient Hospital Stay (HOSPITAL_BASED_OUTPATIENT_CLINIC_OR_DEPARTMENT_OTHER): Payer: BC Managed Care – PPO | Admitting: Nurse Practitioner

## 2019-04-05 ENCOUNTER — Encounter: Payer: Self-pay | Admitting: Nurse Practitioner

## 2019-04-05 VITALS — BP 125/81 | HR 88 | Temp 98.0°F | Resp 18 | Ht 67.0 in | Wt 133.5 lb

## 2019-04-05 DIAGNOSIS — C20 Malignant neoplasm of rectum: Secondary | ICD-10-CM | POA: Diagnosis not present

## 2019-04-05 DIAGNOSIS — Z95828 Presence of other vascular implants and grafts: Secondary | ICD-10-CM

## 2019-04-05 LAB — CMP (CANCER CENTER ONLY)
ALT: 7 U/L (ref 0–44)
AST: 18 U/L (ref 15–41)
Albumin: 3.4 g/dL — ABNORMAL LOW (ref 3.5–5.0)
Alkaline Phosphatase: 153 U/L — ABNORMAL HIGH (ref 38–126)
Anion gap: 8 (ref 5–15)
BUN: 9 mg/dL (ref 6–20)
CO2: 25 mmol/L (ref 22–32)
Calcium: 8.5 mg/dL — ABNORMAL LOW (ref 8.9–10.3)
Chloride: 100 mmol/L (ref 98–111)
Creatinine: 0.57 mg/dL (ref 0.44–1.00)
GFR, Est AFR Am: >60 mL/min (ref >60)
GFR, Estimated: >60 mL/min (ref >60)
Glucose, Bld: 96 mg/dL (ref 70–99)
Potassium: 4 mmol/L (ref 3.5–5.1)
Sodium: 133 mmol/L — ABNORMAL LOW (ref 135–145)
Total Bilirubin: 0.4 mg/dL (ref 0.3–1.2)
Total Protein: 5.9 g/dL — ABNORMAL LOW (ref 6.5–8.1)

## 2019-04-05 LAB — CBC WITH DIFFERENTIAL (CANCER CENTER ONLY)
Abs Immature Granulocytes: 0.23 10*3/uL — ABNORMAL HIGH (ref 0.00–0.07)
Basophils Absolute: 0 10*3/uL (ref 0.0–0.1)
Basophils Relative: 0 %
Eosinophils Absolute: 0.1 10*3/uL (ref 0.0–0.5)
Eosinophils Relative: 1 %
HCT: 27.9 % — ABNORMAL LOW (ref 36.0–46.0)
Hemoglobin: 8.4 g/dL — ABNORMAL LOW (ref 12.0–15.0)
Immature Granulocytes: 3 %
Lymphocytes Relative: 14 %
Lymphs Abs: 1 10*3/uL (ref 0.7–4.0)
MCH: 21.5 pg — ABNORMAL LOW (ref 26.0–34.0)
MCHC: 30.1 g/dL (ref 30.0–36.0)
MCV: 71.4 fL — ABNORMAL LOW (ref 80.0–100.0)
Monocytes Absolute: 0.6 10*3/uL (ref 0.1–1.0)
Monocytes Relative: 8 %
Neutro Abs: 5.1 10*3/uL (ref 1.7–7.7)
Neutrophils Relative %: 74 %
Platelet Count: 294 10*3/uL (ref 150–400)
RBC: 3.91 MIL/uL (ref 3.87–5.11)
RDW: 17.2 % — ABNORMAL HIGH (ref 11.5–15.5)
WBC Count: 7 10*3/uL (ref 4.0–10.5)
nRBC: 0.3 % — ABNORMAL HIGH (ref 0.0–0.2)

## 2019-04-05 LAB — MAGNESIUM: Magnesium: 1.6 mg/dL — ABNORMAL LOW (ref 1.7–2.4)

## 2019-04-05 MED ORDER — SODIUM CHLORIDE 0.9 % IV SOLN
Freq: Once | INTRAVENOUS | Status: AC
  Administered 2019-04-05: 12:00:00 via INTRAVENOUS
  Filled 2019-04-05: qty 5

## 2019-04-05 MED ORDER — CYCLOBENZAPRINE HCL 5 MG PO TABS: 5.0000 mg | ORAL_TABLET | Freq: Two times a day (BID) | ORAL | 0 refills | Status: DC | PRN

## 2019-04-05 MED ORDER — PALONOSETRON HCL INJECTION 0.25 MG/5ML
0.2500 mg | Freq: Once | INTRAVENOUS | Status: AC
  Administered 2019-04-05: 12:00:00 0.25 mg via INTRAVENOUS

## 2019-04-05 MED ORDER — LEUCOVORIN CALCIUM INJECTION 350 MG
200.0000 mg/m2 | Freq: Once | INTRAVENOUS | Status: AC
  Administered 2019-04-05: 318 mg via INTRAVENOUS
  Filled 2019-04-05: qty 15.9

## 2019-04-05 MED ORDER — SODIUM CHLORIDE 0.9 % IV SOLN
INTRAVENOUS | Status: DC
  Administered 2019-04-05: 12:00:00 via INTRAVENOUS
  Filled 2019-04-05: qty 250

## 2019-04-05 MED ORDER — SODIUM CHLORIDE 0.9 % IV SOLN
3150.0000 mg/m2 | INTRAVENOUS | Status: DC
  Administered 2019-04-05: 15:00:00 5000 mg via INTRAVENOUS
  Filled 2019-04-05: qty 100

## 2019-04-05 MED ORDER — PALONOSETRON HCL INJECTION 0.25 MG/5ML
INTRAVENOUS | Status: AC
  Filled 2019-04-05: qty 5

## 2019-04-05 MED ORDER — IRINOTECAN HCL CHEMO INJECTION 100 MG/5ML
150.0000 mg/m2 | Freq: Once | INTRAVENOUS | Status: AC
  Administered 2019-04-05: 14:00:00 240 mg via INTRAVENOUS
  Filled 2019-04-05: qty 12

## 2019-04-05 MED ORDER — SODIUM CHLORIDE 0.9% FLUSH
10.0000 mL | Freq: Once | INTRAVENOUS | Status: AC
  Administered 2019-04-05: 11:00:00 10 mL
  Filled 2019-04-05: qty 10

## 2019-04-05 NOTE — Patient Instructions (Signed)
[facility] Discharge Instructions for Patients Receiving Chemotherapy  Today you received the following chemotherapy agents Irinotecan (CAMPTOSAR), Leucovorin & Flourouracil (ADRUCIL).  To help prevent nausea and vomiting after your treatment, we encourage you to take your nausea medication as prescribed.    If you develop nausea and vomiting that is not controlled by your nausea medication, call the clinic.   BELOW ARE SYMPTOMS THAT SHOULD BE REPORTED IMMEDIATELY:  *FEVER GREATER THAN 100.5 F  *CHILLS WITH OR WITHOUT FEVER  NAUSEA AND VOMITING THAT IS NOT CONTROLLED WITH YOUR NAUSEA MEDICATION  *UNUSUAL SHORTNESS OF BREATH  *UNUSUAL BRUISING OR BLEEDING  TENDERNESS IN MOUTH AND THROAT WITH OR WITHOUT PRESENCE OF ULCERS  *URINARY PROBLEMS  *BOWEL PROBLEMS  UNUSUAL RASH Items with * indicate a potential emergency and should be followed up as soon as possible.  Feel free to call the clinic should you have any questions or concerns. The clinic phone number is [phone].  Please show the CHEMO ALERT CARD at check-in to the Emergency Department and triage nurse.   

## 2019-04-05 NOTE — Progress Notes (Signed)
[facility] OFFICE PROGRESS NOTE   Diagnosis: Rectal cancer  INTERVAL HISTORY:   Tina Morales returns as scheduled.  She completed cycle 3 FOLFIRI 03/15/2019.  She had no significant nausea.  No mouth sores.  No diarrhea.  She notes muscle soreness following chemotherapy.  She tried Flexeril recently and noted improvement.  Objective:  Vital signs in last 24 hours:  Blood pressure 125/81, pulse 88, temperature 98 F (36.7 C), temperature source Oral, resp. rate 18, height 5' 7" (1.702 m), weight 133 lb 8 oz (60.6 kg), SpO2 98 %.    HEENT: No thrush or ulcers. GI: Abdomen soft and nontender.  No hepatomegaly.  Left lower quadrant colostomy. Vascular: No leg edema.  Calves soft and nontender. Neuro: Alert and oriented. Skin: Palms without erythema. Port-A-Cath without erythema.   Lab Results:  Lab Results  Component Value Date   WBC 7.0 04/05/2019   HGB 8.4 (L) 04/05/2019   HCT 27.9 (L) 04/05/2019   MCV 71.4 (L) 04/05/2019   PLT 294 04/05/2019   NEUTROABS 5.1 04/05/2019    Imaging:  No results found.  Medications: I have reviewed the patient's current medications.  Assessment/Plan: 1. Rectal cancer-rectal mass noted on digital exam 02/10/2018, colonoscopy confirmed a him my circumferential mass in the rectum ? 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 CTs 02/10/2018-anterior rectal wall thickening, pulmonary nodules measuring up to 9 mm concerning for metastases, few round perirectal lymph nodes ? Pelvic MRI 02/20/2018-hypermetabolic low rectal mass extending to the posterior vagina with 2 small enlarged perirectal lymph nodes, T4b,N1-2.3 cm from the anal verge ? PET scan 02/23/2018-hypermetabolic rectal mass, 8 mm hypermetabolic  lingular nodule, scattered small bilateral lung nodules, some calcified, a few with mildly increased activity ? Cycle 1 FOLFOXIRI8/21/2019 ? Cycle 5 FOLFOXIRI10/16/2019 ? CTs 05/01/2018-significant interval response to therapy with decreased size of the primary rectal mass lesion. Decreasing perirectal lymphadenopathy. Decreased and/or resolved pulmonary nodules. No new sites of disease identified. ? Cycle 6 FOLFOXIRI10/31/2019 ? Radiation/Xeloda 06/12/2018-completed 07/21/2018 ? Xeloda dose reduced 07/10/2018 due to mucositis and diarrhea ? CTs 08/07/2018-compared to 02/10/2018-resolved and decreased pulmonary nodules, few tiny residual noncalcified nodules, decreased 20 perirectal lymph nodes, soft tissue of the rectum indistinguishable from posterior wall of vagina-Vaginal involvement by tumor? ? 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 CT chest 09/29/2018-enlargement of lung nodules, not a candidate for SBRT based on discussion in GI tumor conference and with radiation oncology ? CT chest 11/27/18 - interval growth of numerous pulmonary metastases bilaterally compared to 09/29/18, no new pulmonary mets ? Cycle 1FOLFOXIRI 11/29/18 ? Cycle 2 FOLFOXIRI6/10/2018 ? Cycle 3 FOLFOXIRI 12/28/2018 ? Cycle 4 FOLFIRINOX 01/11/2019 ? CTs 01/24/2019-significant improvement of bilateral pulmonary metastases. ? Cycle 1 FOLFIRI 02/01/2019 ? Cycle 2 FOLFIRI 02/22/2019 ? Cycle 3 FOLFIRI 03/15/2019 ? 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 Imodium as needed. 6.Mucositis secondary  to Xeloda. Improved 07/19/2018. 7. History of migraines    Disposition: Tina Morales appears stable.  She has completed 3 cycles of FOLFIRI.  There is no clinical   evidence of disease progression.  Overall she is tolerating the chemotherapy well.  Plan to proceed with cycle 4 today as scheduled.  The plan is for restaging CTs after cycle 5 FOLFIRI.  We reviewed the CBC from today.  Counts adequate to proceed with treatment.  She will return for lab, follow-up, FOLFIRI in 3 weeks.  She will contact the office in the interim with any problems.    Lisa Thomas ANP/GNP-BC   04/05/2019  11:11 AM        

## 2019-04-05 NOTE — Patient Instructions (Signed)

## 2019-04-06 ENCOUNTER — Telehealth: Payer: Self-pay | Admitting: Nurse Practitioner

## 2019-04-06 NOTE — Telephone Encounter (Signed)
Called and spoke with patient. Confirmed appt  °

## 2019-04-07 ENCOUNTER — Other Ambulatory Visit: Payer: Self-pay

## 2019-04-07 ENCOUNTER — Inpatient Hospital Stay: Payer: BC Managed Care – PPO

## 2019-04-07 VITALS — BP 107/92 | HR 96 | Temp 98.7°F | Resp 18

## 2019-04-07 DIAGNOSIS — C20 Malignant neoplasm of rectum: Secondary | ICD-10-CM

## 2019-04-07 MED ORDER — SODIUM CHLORIDE 0.9% FLUSH
10.0000 mL | INTRAVENOUS | Status: DC | PRN
  Administered 2019-04-07: 10 mL
  Filled 2019-04-07: qty 10

## 2019-04-07 MED ORDER — HEPARIN SOD (PORK) LOCK FLUSH 100 UNIT/ML IV SOLN
500.0000 [IU] | Freq: Once | INTRAVENOUS | Status: AC | PRN
  Administered 2019-04-07: 500 [IU]
  Filled 2019-04-07: qty 5

## 2019-04-07 NOTE — Patient Instructions (Signed)

## 2019-04-16 ENCOUNTER — Other Ambulatory Visit: Payer: Self-pay | Admitting: *Deleted

## 2019-04-16 DIAGNOSIS — C20 Malignant neoplasm of rectum: Secondary | ICD-10-CM

## 2019-04-16 MED ORDER — POTASSIUM CHLORIDE CRYS ER 20 MEQ PO TBCR: 20.0000 meq | EXTENDED_RELEASE_TABLET | Freq: Two times a day (BID) | ORAL | 1 refills | Status: DC

## 2019-04-22 ENCOUNTER — Other Ambulatory Visit: Payer: Self-pay | Admitting: Oncology

## 2019-04-23 ENCOUNTER — Other Ambulatory Visit: Payer: Self-pay | Admitting: *Deleted

## 2019-04-23 DIAGNOSIS — C20 Malignant neoplasm of rectum: Secondary | ICD-10-CM

## 2019-04-23 MED ORDER — ALPRAZOLAM 0.5 MG PO TABS: 0.5000 mg | ORAL_TABLET | Freq: Two times a day (BID) | ORAL | 0 refills | Status: DC | PRN

## 2019-04-23 NOTE — Progress Notes (Signed)
Patient left VM requesting refill on her alprazolam. Script called in as requested to automated line at [facility].

## 2019-04-26 ENCOUNTER — Inpatient Hospital Stay: Payer: BC Managed Care – PPO

## 2019-04-26 ENCOUNTER — Inpatient Hospital Stay: Payer: BC Managed Care – PPO | Attending: Oncology | Admitting: Nurse Practitioner

## 2019-04-26 ENCOUNTER — Other Ambulatory Visit: Payer: Self-pay

## 2019-04-26 ENCOUNTER — Encounter: Payer: Self-pay | Admitting: Nurse Practitioner

## 2019-04-26 VITALS — BP 110/83 | HR 84 | Temp 98.9°F | Resp 18 | Ht 67.0 in | Wt 135.4 lb

## 2019-04-26 DIAGNOSIS — Z5111 Encounter for antineoplastic chemotherapy: Secondary | ICD-10-CM | POA: Diagnosis present

## 2019-04-26 DIAGNOSIS — C7801 Secondary malignant neoplasm of right lung: Secondary | ICD-10-CM | POA: Insufficient documentation

## 2019-04-26 DIAGNOSIS — C7802 Secondary malignant neoplasm of left lung: Secondary | ICD-10-CM | POA: Insufficient documentation

## 2019-04-26 DIAGNOSIS — C20 Malignant neoplasm of rectum: Secondary | ICD-10-CM | POA: Insufficient documentation

## 2019-04-26 DIAGNOSIS — Z95828 Presence of other vascular implants and grafts: Secondary | ICD-10-CM

## 2019-04-26 LAB — CMP (CANCER CENTER ONLY)
ALT: 7 U/L (ref 0–44)
AST: 15 U/L (ref 15–41)
Albumin: 3.5 g/dL (ref 3.5–5.0)
Alkaline Phosphatase: 138 U/L — ABNORMAL HIGH (ref 38–126)
Anion gap: 11 (ref 5–15)
BUN: 7 mg/dL (ref 6–20)
CO2: 23 mmol/L (ref 22–32)
Calcium: 8.6 mg/dL — ABNORMAL LOW (ref 8.9–10.3)
Chloride: 100 mmol/L (ref 98–111)
Creatinine: 0.57 mg/dL (ref 0.44–1.00)
GFR, Est AFR Am: >60 mL/min (ref >60)
GFR, Estimated: >60 mL/min (ref >60)
Glucose, Bld: 91 mg/dL (ref 70–99)
Potassium: 4.2 mmol/L (ref 3.5–5.1)
Sodium: 134 mmol/L — ABNORMAL LOW (ref 135–145)
Total Bilirubin: 0.4 mg/dL (ref 0.3–1.2)
Total Protein: 6 g/dL — ABNORMAL LOW (ref 6.5–8.1)

## 2019-04-26 LAB — CBC WITH DIFFERENTIAL (CANCER CENTER ONLY)
Abs Immature Granulocytes: 0.08 10*3/uL — ABNORMAL HIGH (ref 0.00–0.07)
Basophils Absolute: 0 10*3/uL (ref 0.0–0.1)
Basophils Relative: 1 %
Eosinophils Absolute: 0.1 10*3/uL (ref 0.0–0.5)
Eosinophils Relative: 2 %
HCT: 29.2 % — ABNORMAL LOW (ref 36.0–46.0)
Hemoglobin: 8.9 g/dL — ABNORMAL LOW (ref 12.0–15.0)
Immature Granulocytes: 2 %
Lymphocytes Relative: 21 %
Lymphs Abs: 0.9 10*3/uL (ref 0.7–4.0)
MCH: 21.4 pg — ABNORMAL LOW (ref 26.0–34.0)
MCHC: 30.5 g/dL (ref 30.0–36.0)
MCV: 70.4 fL — ABNORMAL LOW (ref 80.0–100.0)
Monocytes Absolute: 0.5 10*3/uL (ref 0.1–1.0)
Monocytes Relative: 11 %
Neutro Abs: 2.8 10*3/uL (ref 1.7–7.7)
Neutrophils Relative %: 63 %
Platelet Count: 273 10*3/uL (ref 150–400)
RBC: 4.15 MIL/uL (ref 3.87–5.11)
RDW: 16 % — ABNORMAL HIGH (ref 11.5–15.5)
WBC Count: 4.3 10*3/uL (ref 4.0–10.5)
nRBC: 0 % (ref 0.0–0.2)

## 2019-04-26 LAB — CEA (IN HOUSE-CHCC): CEA (CHCC-In House): 1.8 ng/mL (ref 0.00–5.00)

## 2019-04-26 LAB — MAGNESIUM: Magnesium: 1.9 mg/dL (ref 1.7–2.4)

## 2019-04-26 MED ORDER — SODIUM CHLORIDE 0.9% FLUSH
10.0000 mL | INTRAVENOUS | Status: DC | PRN
  Administered 2019-04-26: 10 mL
  Filled 2019-04-26: qty 10

## 2019-04-26 MED ORDER — PALONOSETRON HCL INJECTION 0.25 MG/5ML
INTRAVENOUS | Status: AC
  Filled 2019-04-26: qty 5

## 2019-04-26 MED ORDER — SODIUM CHLORIDE 0.9 % IV SOLN
INTRAVENOUS | Status: DC
  Administered 2019-04-26: 10:00:00 via INTRAVENOUS
  Filled 2019-04-26: qty 250

## 2019-04-26 MED ORDER — HEPARIN SOD (PORK) LOCK FLUSH 100 UNIT/ML IV SOLN
500.0000 [IU] | Freq: Once | INTRAVENOUS | Status: DC | PRN
  Filled 2019-04-26: qty 5

## 2019-04-26 MED ORDER — SODIUM CHLORIDE 0.9 % IV SOLN
3150.0000 mg/m2 | INTRAVENOUS | Status: DC
  Administered 2019-04-26: 13:00:00 5000 mg via INTRAVENOUS
  Filled 2019-04-26: qty 100

## 2019-04-26 MED ORDER — PALONOSETRON HCL INJECTION 0.25 MG/5ML
0.2500 mg | Freq: Once | INTRAVENOUS | Status: AC
  Administered 2019-04-26: 10:00:00 0.25 mg via INTRAVENOUS

## 2019-04-26 MED ORDER — LEUCOVORIN CALCIUM INJECTION 350 MG
200.0000 mg/m2 | Freq: Once | INTRAVENOUS | Status: AC
  Administered 2019-04-26: 11:00:00 318 mg via INTRAVENOUS
  Filled 2019-04-26: qty 15.9

## 2019-04-26 MED ORDER — IRINOTECAN HCL CHEMO INJECTION 100 MG/5ML
150.0000 mg/m2 | Freq: Once | INTRAVENOUS | Status: AC
  Administered 2019-04-26: 11:00:00 240 mg via INTRAVENOUS
  Filled 2019-04-26: qty 12

## 2019-04-26 MED ORDER — SODIUM CHLORIDE 0.9 % IV SOLN
Freq: Once | INTRAVENOUS | Status: AC
  Administered 2019-04-26: 10:00:00 via INTRAVENOUS
  Filled 2019-04-26: qty 5

## 2019-04-26 MED ORDER — SODIUM CHLORIDE 0.9% FLUSH
10.0000 mL | Freq: Once | INTRAVENOUS | Status: AC
  Administered 2019-04-26: 09:00:00 10 mL
  Filled 2019-04-26: qty 10

## 2019-04-26 NOTE — Progress Notes (Signed)
[place] OFFICE PROGRESS NOTE   Diagnosis: Rectal cancer  INTERVAL HISTORY:   Tina Morales returns as scheduled.  She completed cycle 4 FOLFIRI 04/05/2019.  She had mild nausea, no vomiting.  No diarrhea.  No significant mouth sores.  She again had muscle soreness following treatment.  She took Flexeril with good relief.  Objective:  Vital signs in last 24 hours:  Blood pressure 110/83, pulse 84, temperature 98.9 F (37.2 C), temperature source Temporal, resp. rate 18, height _0  (1.702 m), weight 135 lb 6.4 oz (61.4 kg), SpO2 99 %.    HEENT: No thrush or ulcers. GI: Abdomen soft and nontender.  No hepatomegaly.  Left lower quadrant colostomy. Vascular: No leg edema.  Calves soft and nontender. Neuro: Alert and oriented. Skin: Palms without erythema. Port-A-Cath without erythema.   Lab Results:  Lab Results  Component Value Date   WBC 4.3 04/26/2019   HGB 8.9 (L) 04/26/2019   HCT 29.2 (L) 04/26/2019   MCV 70.4 (L) 04/26/2019   PLT 273 04/26/2019   NEUTROABS 2.8 04/26/2019    Imaging:  No results found.  Medications: I have reviewed the patient's current medications.  Assessment/Plan: 1. Rectal cancer-rectal mass noted on digital exam 02/10/2018, colonoscopy confirmed a him my circumferential mass in the rectum ? 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 CTs 02/10/2018-anterior rectal wall thickening, pulmonary nodules measuring up to 9 mm concerning for metastases, few round perirectal lymph nodes ? Pelvic MRI 02/20/2018-hypermetabolic low rectal mass extending to the posterior vagina with 2 small enlarged perirectal lymph nodes, T4b,N1-2.3 cm from the anal verge ? PET scan 02/23/2018-hypermetabolic rectal mass, 8 mm  hypermetabolic lingular nodule, scattered small bilateral lung nodules, some calcified, a few with mildly increased activity ? Cycle 1 FOLFOXIRI8/21/2019 ? Cycle 5 FOLFOXIRI10/16/2019 ? CTs 05/01/2018-significant interval response to therapy with decreased size of the primary rectal mass lesion. Decreasing perirectal lymphadenopathy. Decreased and/or resolved pulmonary nodules. No new sites of disease identified. ? Cycle 6 FOLFOXIRI10/31/2019 ? Radiation/Xeloda 06/12/2018-completed 07/21/2018 ? Xeloda dose reduced 07/10/2018 due to mucositis and diarrhea ? CTs 08/07/2018-compared to 02/10/2018-resolved and decreased pulmonary nodules, few tiny residual noncalcified nodules, decreased 20 perirectal lymph nodes, soft tissue of the rectum indistinguishable from posterior wall of vagina-Vaginal involvement by tumor? ? 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 CT chest 09/29/2018-enlargement of lung nodules, not a candidate for SBRT based on discussion in GI tumor conference and with radiation oncology ? CT chest 11/27/18 - interval growth of numerous pulmonary metastases bilaterally compared to 09/29/18, no new pulmonary mets ? Cycle 1FOLFOXIRI 11/29/18 ? Cycle 2 FOLFOXIRI6/10/2018 ? Cycle 3 FOLFOXIRI 12/28/2018 ? Cycle 4 FOLFIRINOX 01/11/2019 ? CTs 01/24/2019-significant improvement of bilateral pulmonary metastases. ? Cycle 1 FOLFIRI 02/01/2019 ? Cycle 2 FOLFIRI 02/22/2019 ? Cycle 3 FOLFIRI 03/15/2019 ? Cycle 4 FOLFIRI 04/05/2019 ? 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  Imodium as needed. 6.Mucositis secondary to Xeloda. Improved 07/19/2018. 7. History of migraines   Disposition: Tina Morales appears stable.  She has completed 4 cycles of  FOLFIRI.  Plan to proceed with cycle 5 today as scheduled.  Restaging CTs prior to her next visit in 2 weeks.  We reviewed the CBC from today.  Counts adequate to proceed with treatment.  She will return for follow-up in 3 weeks.  She will contact the office in the interim with any problems.    Ned Card ANP/GNP-BC   04/26/2019  9:29 AM

## 2019-04-26 NOTE — Patient Instructions (Signed)

## 2019-04-26 NOTE — Patient Instructions (Signed)
[facility] Discharge Instructions for Patients Receiving Chemotherapy  Today you received the following chemotherapy agents Irinotecan (CAMPTOSAR), Leucovorin & Flourouracil (ADRUCIL).  To help prevent nausea and vomiting after your treatment, we encourage you to take your nausea medication as prescribed.    If you develop nausea and vomiting that is not controlled by your nausea medication, call the clinic.   BELOW ARE SYMPTOMS THAT SHOULD BE REPORTED IMMEDIATELY:  *FEVER GREATER THAN 100.5 F  *CHILLS WITH OR WITHOUT FEVER  NAUSEA AND VOMITING THAT IS NOT CONTROLLED WITH YOUR NAUSEA MEDICATION  *UNUSUAL SHORTNESS OF BREATH  *UNUSUAL BRUISING OR BLEEDING  TENDERNESS IN MOUTH AND THROAT WITH OR WITHOUT PRESENCE OF ULCERS  *URINARY PROBLEMS  *BOWEL PROBLEMS  UNUSUAL RASH Items with * indicate a potential emergency and should be followed up as soon as possible.  Feel free to call the clinic should you have any questions or concerns. The clinic phone number is [phone].  Please show the CHEMO ALERT CARD at check-in to the Emergency Department and triage nurse.   

## 2019-04-28 ENCOUNTER — Inpatient Hospital Stay: Payer: BC Managed Care – PPO

## 2019-04-28 ENCOUNTER — Other Ambulatory Visit: Payer: Self-pay

## 2019-04-28 VITALS — BP 109/86 | HR 74 | Temp 99.1°F | Resp 16

## 2019-04-28 DIAGNOSIS — C20 Malignant neoplasm of rectum: Secondary | ICD-10-CM

## 2019-04-28 MED ORDER — SODIUM CHLORIDE 0.9% FLUSH
10.0000 mL | INTRAVENOUS | Status: DC | PRN
  Administered 2019-04-28: 10 mL
  Filled 2019-04-28: qty 10

## 2019-04-28 MED ORDER — HEPARIN SOD (PORK) LOCK FLUSH 100 UNIT/ML IV SOLN
500.0000 [IU] | Freq: Once | INTRAVENOUS | Status: AC | PRN
  Administered 2019-04-28: 500 [IU]
  Filled 2019-04-28: qty 5

## 2019-05-13 ENCOUNTER — Other Ambulatory Visit: Payer: Self-pay | Admitting: Oncology

## 2019-05-14 ENCOUNTER — Ambulatory Visit (HOSPITAL_COMMUNITY)
Admission: RE | Admit: 2019-05-14 | Discharge: 2019-05-14 | Disposition: A | Discharge status: Home / Self Care | Payer: BC Managed Care – PPO | Source: Ambulatory Visit | Attending: Nurse Practitioner | Admitting: Nurse Practitioner

## 2019-05-14 ENCOUNTER — Other Ambulatory Visit: Payer: Self-pay

## 2019-05-14 DIAGNOSIS — C20 Malignant neoplasm of rectum: Secondary | ICD-10-CM | POA: Diagnosis present

## 2019-05-14 MED ORDER — HEPARIN SOD (PORK) LOCK FLUSH 100 UNIT/ML IV SOLN
500.0000 [IU] | Freq: Once | INTRAVENOUS | Status: AC
  Administered 2019-05-14: 500 [IU]

## 2019-05-14 MED ORDER — IOHEXOL 300 MG/ML  SOLN
100.0000 mL | Freq: Once | INTRAMUSCULAR | Status: AC | PRN
  Administered 2019-05-14: 100 mL via INTRAVENOUS

## 2019-05-14 MED ORDER — HEPARIN SOD (PORK) LOCK FLUSH 100 UNIT/ML IV SOLN
INTRAVENOUS | Status: AC
  Filled 2019-05-14: qty 5

## 2019-05-14 MED ORDER — SODIUM CHLORIDE (PF) 0.9 % IJ SOLN
INTRAMUSCULAR | Status: AC
  Filled 2019-05-14: qty 50

## 2019-05-16 ENCOUNTER — Other Ambulatory Visit: Payer: Self-pay | Admitting: *Deleted

## 2019-05-16 DIAGNOSIS — C20 Malignant neoplasm of rectum: Secondary | ICD-10-CM

## 2019-05-17 ENCOUNTER — Other Ambulatory Visit: Payer: Self-pay

## 2019-05-17 ENCOUNTER — Inpatient Hospital Stay: Payer: BC Managed Care – PPO

## 2019-05-17 ENCOUNTER — Inpatient Hospital Stay: Payer: BC Managed Care – PPO | Attending: Oncology | Admitting: Oncology

## 2019-05-17 VITALS — HR 98

## 2019-05-17 DIAGNOSIS — C7801 Secondary malignant neoplasm of right lung: Secondary | ICD-10-CM | POA: Diagnosis present

## 2019-05-17 DIAGNOSIS — Z5111 Encounter for antineoplastic chemotherapy: Secondary | ICD-10-CM | POA: Diagnosis present

## 2019-05-17 DIAGNOSIS — Z95828 Presence of other vascular implants and grafts: Secondary | ICD-10-CM

## 2019-05-17 DIAGNOSIS — C20 Malignant neoplasm of rectum: Secondary | ICD-10-CM

## 2019-05-17 LAB — CBC WITH DIFFERENTIAL (CANCER CENTER ONLY)
Abs Immature Granulocytes: 0.03 10*3/uL (ref 0.00–0.07)
Basophils Absolute: 0 10*3/uL (ref 0.0–0.1)
Basophils Relative: 0 %
Eosinophils Absolute: 0.1 10*3/uL (ref 0.0–0.5)
Eosinophils Relative: 1 %
HCT: 31.6 % — ABNORMAL LOW (ref 36.0–46.0)
Hemoglobin: 9.9 g/dL — ABNORMAL LOW (ref 12.0–15.0)
Immature Granulocytes: 1 %
Lymphocytes Relative: 18 %
Lymphs Abs: 0.7 10*3/uL (ref 0.7–4.0)
MCH: 21.6 pg — ABNORMAL LOW (ref 26.0–34.0)
MCHC: 31.3 g/dL (ref 30.0–36.0)
MCV: 69 fL — ABNORMAL LOW (ref 80.0–100.0)
Monocytes Absolute: 0.4 10*3/uL (ref 0.1–1.0)
Monocytes Relative: 11 %
Neutro Abs: 2.7 10*3/uL (ref 1.7–7.7)
Neutrophils Relative %: 69 %
Platelet Count: 223 10*3/uL (ref 150–400)
RBC: 4.58 MIL/uL (ref 3.87–5.11)
RDW: 15.1 % (ref 11.5–15.5)
WBC Count: 3.9 10*3/uL — ABNORMAL LOW (ref 4.0–10.5)
nRBC: 0 % (ref 0.0–0.2)

## 2019-05-17 LAB — CMP (CANCER CENTER ONLY)
ALT: 8 U/L (ref 0–44)
AST: 14 U/L — ABNORMAL LOW (ref 15–41)
Albumin: 3.7 g/dL (ref 3.5–5.0)
Alkaline Phosphatase: 118 U/L (ref 38–126)
Anion gap: 10 (ref 5–15)
BUN: <4 mg/dL — ABNORMAL LOW (ref 6–20)
CO2: 24 mmol/L (ref 22–32)
Calcium: 8.8 mg/dL — ABNORMAL LOW (ref 8.9–10.3)
Chloride: 104 mmol/L (ref 98–111)
Creatinine: 0.62 mg/dL (ref 0.44–1.00)
GFR, Est AFR Am: >60 mL/min (ref >60)
GFR, Estimated: >60 mL/min (ref >60)
Glucose, Bld: 111 mg/dL — ABNORMAL HIGH (ref 70–99)
Potassium: 4.1 mmol/L (ref 3.5–5.1)
Sodium: 138 mmol/L (ref 135–145)
Total Bilirubin: 0.3 mg/dL (ref 0.3–1.2)
Total Protein: 5.9 g/dL — ABNORMAL LOW (ref 6.5–8.1)

## 2019-05-17 LAB — MAGNESIUM: Magnesium: 1.8 mg/dL (ref 1.7–2.4)

## 2019-05-17 MED ORDER — IRINOTECAN HCL CHEMO INJECTION 100 MG/5ML
150.0000 mg/m2 | Freq: Once | INTRAVENOUS | Status: AC
  Administered 2019-05-17: 240 mg via INTRAVENOUS
  Filled 2019-05-17: qty 12

## 2019-05-17 MED ORDER — CYCLOBENZAPRINE HCL 5 MG PO TABS: 5.0000 mg | ORAL_TABLET | Freq: Two times a day (BID) | ORAL | 0 refills | Status: DC | PRN

## 2019-05-17 MED ORDER — LEUCOVORIN CALCIUM INJECTION 350 MG
200.0000 mg/m2 | Freq: Once | INTRAVENOUS | Status: AC
  Administered 2019-05-17: 12:00:00 318 mg via INTRAVENOUS
  Filled 2019-05-17: qty 15.9

## 2019-05-17 MED ORDER — SODIUM CHLORIDE 0.9% FLUSH
10.0000 mL | Freq: Once | INTRAVENOUS | Status: AC
  Administered 2019-05-17: 09:00:00 10 mL
  Filled 2019-05-17: qty 10

## 2019-05-17 MED ORDER — SODIUM CHLORIDE 0.9 % IV SOLN
5000.0000 mg | INTRAVENOUS | Status: DC
  Administered 2019-05-17: 5000 mg via INTRAVENOUS
  Filled 2019-05-17: qty 100

## 2019-05-17 MED ORDER — SODIUM CHLORIDE 0.9 % IV SOLN
Freq: Once | INTRAVENOUS | Status: AC
  Administered 2019-05-17: 11:00:00 via INTRAVENOUS
  Filled 2019-05-17: qty 250

## 2019-05-17 MED ORDER — ALPRAZOLAM 0.5 MG PO TABS: 0.5000 mg | ORAL_TABLET | Freq: Two times a day (BID) | ORAL | 0 refills | Status: DC | PRN

## 2019-05-17 MED ORDER — SODIUM CHLORIDE 0.9 % IV SOLN
Freq: Once | INTRAVENOUS | Status: AC
  Administered 2019-05-17: 11:00:00 via INTRAVENOUS
  Filled 2019-05-17: qty 5

## 2019-05-17 MED ORDER — PALONOSETRON HCL INJECTION 0.25 MG/5ML
INTRAVENOUS | Status: AC
  Filled 2019-05-17: qty 5

## 2019-05-17 MED ORDER — PALONOSETRON HCL INJECTION 0.25 MG/5ML
0.2500 mg | Freq: Once | INTRAVENOUS | Status: AC
  Administered 2019-05-17: 0.25 mg via INTRAVENOUS

## 2019-05-17 NOTE — Patient Instructions (Signed)

## 2019-05-17 NOTE — Progress Notes (Signed)
[place] OFFICE PROGRESS NOTE   Diagnosis: Rectal cancer  INTERVAL HISTORY:   Ms. Toor completed another cycle of FOLFIRI on 04/26/2019.  She had nausea following chemotherapy, but no emesis.  She has fatigue during the following chemotherapy.  No diarrhea.  Objective:  Vital signs in last 24 hours:  Blood pressure 106/77, pulse (!) 104, temperature 98.7 F (37.1 C), temperature source Temporal, resp. rate 17, height 5' 7"  (1.702 m), weight 132 lb 1.6 oz (59.9 kg), SpO2 99 %.    HEENT: No thrush or ulcers Resp: Lungs clear bilaterally Cardio: Regular rate and rhythm GI: No hepatomegaly, left lower quadrant colostomy, nontender Vascular: No leg edema Skin: Palms without erythema  Portacath/PICC-without erythema  Lab Results:  Lab Results  Component Value Date   WBC 3.9 (L) 05/17/2019   HGB 9.9 (L) 05/17/2019   HCT 31.6 (L) 05/17/2019   MCV 69.0 (L) 05/17/2019   PLT 223 05/17/2019   NEUTROABS 2.7 05/17/2019    CMP  Lab Results  Component Value Date   NA 138 05/17/2019   K 4.1 05/17/2019   CL 104 05/17/2019   CO2 24 05/17/2019   GLUCOSE 111 (H) 05/17/2019   BUN <4 (L) 05/17/2019   CREATININE 0.62 05/17/2019   CALCIUM 8.8 (L) 05/17/2019   PROT 5.9 (L) 05/17/2019   ALBUMIN 3.7 05/17/2019   AST 14 (L) 05/17/2019   ALT 8 05/17/2019   ALKPHOS 118 05/17/2019   BILITOT 0.3 05/17/2019   GFRNONAA >60 05/17/2019   GFRAA >60 05/17/2019    Lab Results  Component Value Date   CEA1 1.80 04/26/2019      Imaging:  Ct Chest W Contrast  Result Date: 05/14/2019 CLINICAL DATA:  Restage rectal cancer. Diagnosed in August of 2019 found to have pulmonary metastatic disease and the local adenopathy. Status post APR with vaginectomy and February of 2020 is status post 5 cycles of FOLFIRI. EXAM: CT CHEST, ABDOMEN, AND PELVIS WITH CONTRAST TECHNIQUE: Multidetector CT imaging of the chest, abdomen and pelvis was performed following the standard protocol  during bolus administration of intravenous contrast. CONTRAST:  16m OMNIPAQUE IOHEXOL 300 MG/ML  SOLN COMPARISON:  January 24, 2019, abdomen and pelvis comparison from October 04, 2018 FINDINGS: CT CHEST FINDINGS Cardiovascular: Right IJ Port-A-Cath terminates at the caval to atrial junction. Heart size is normal. Aortic caliber is normal with scattered atherosclerotic changes both calcified and noncalcified. No signs of pericardial effusion. Central pulmonary vasculature is unremarkable. Mediastinum/Nodes: No enlarged mediastinal, hilar, or axillary lymph nodes. Thyroid gland, trachea, and esophagus demonstrate no significant findings. Lungs/Pleura: Centrilobular emphysema. Calcified granulomata of the chest are similar to the prior study. (Image 128, series 6) 4 mm right lower lobe nodule previously 3 mm (Image 116, series 6) lobulated right lower lobe nodule 11 x 7 mm previously 6 mm in greatest axial dimension. (Image 60, series 6) 7 mm right upper lobe pulmonary nodule previously 4 mm. Two additional nodules in the anterior right upper lobe are stable. Musculoskeletal: No signs of chest wall mass. CT ABDOMEN PELVIS FINDINGS Hepatobiliary: Liver is normal. Gallbladder and biliary tree are unremarkable. Pancreas: Normal Spleen: Normal Adrenals/Urinary Tract: Normal adrenal glands. Symmetric enhancement of bilateral kidneys without signs of hydronephrosis. Stomach/Bowel: No signs of acute gastrointestinal process. Positive enteric contrast passes into the proximal colon. The appendix is normal. Left lower quadrant colostomy following APR. Soft tissue thickening along the site of APR, nonspecific previously containing gas in the immediate postoperative state. Vascular/Lymphatic: Moderate calcific and noncalcific  atherosclerosis. No signs of aneurysm. No signs of retroperitoneal or upper abdominal lymphadenopathy. No signs of pelvic lymphadenopathy. Reproductive: Uterus remains in place following APR and reported  vaginectomy. Other: No signs of ascites. No peritoneal or omental nodularity. Musculoskeletal: No signs of acute bone finding or evidence of destructive bone process. Spinal curvature as before. IMPRESSION: 1. Slight enlargement of right lower lobe nodules and the largest right upper lobe nodule good evening particularly the lobulated nodule in the right lower lobe as described. 2. Soft tissue thickening along the site of APR is nonspecific previously containing gas in the immediate postoperative state. This will serve as a baseline for future follow-up. 3. Emphysema and aortic atherosclerosis. Aortic Atherosclerosis (ICD10-I70.0) and Emphysema (ICD10-J43.9). Electronically Signed   By: Zetta Bills M.D.   On: 05/14/2019 22:13   Ct Abdomen Pelvis W Contrast  Result Date: 05/14/2019 CLINICAL DATA:  Restage rectal cancer. Diagnosed in August of 2019 found to have pulmonary metastatic disease and the local adenopathy. Status post APR with vaginectomy and February of 2020 is status post 5 cycles of FOLFIRI. EXAM: CT CHEST, ABDOMEN, AND PELVIS WITH CONTRAST TECHNIQUE: Multidetector CT imaging of the chest, abdomen and pelvis was performed following the standard protocol during bolus administration of intravenous contrast. CONTRAST:  165m OMNIPAQUE IOHEXOL 300 MG/ML  SOLN COMPARISON:  January 24, 2019, abdomen and pelvis comparison from October 04, 2018 FINDINGS: CT CHEST FINDINGS Cardiovascular: Right IJ Port-A-Cath terminates at the caval to atrial junction. Heart size is normal. Aortic caliber is normal with scattered atherosclerotic changes both calcified and noncalcified. No signs of pericardial effusion. Central pulmonary vasculature is unremarkable. Mediastinum/Nodes: No enlarged mediastinal, hilar, or axillary lymph nodes. Thyroid gland, trachea, and esophagus demonstrate no significant findings. Lungs/Pleura: Centrilobular emphysema. Calcified granulomata of the chest are similar to the prior study. (Image 128,  series 6) 4 mm right lower lobe nodule previously 3 mm (Image 116, series 6) lobulated right lower lobe nodule 11 x 7 mm previously 6 mm in greatest axial dimension. (Image 60, series 6) 7 mm right upper lobe pulmonary nodule previously 4 mm. Two additional nodules in the anterior right upper lobe are stable. Musculoskeletal: No signs of chest wall mass. CT ABDOMEN PELVIS FINDINGS Hepatobiliary: Liver is normal. Gallbladder and biliary tree are unremarkable. Pancreas: Normal Spleen: Normal Adrenals/Urinary Tract: Normal adrenal glands. Symmetric enhancement of bilateral kidneys without signs of hydronephrosis. Stomach/Bowel: No signs of acute gastrointestinal process. Positive enteric contrast passes into the proximal colon. The appendix is normal. Left lower quadrant colostomy following APR. Soft tissue thickening along the site of APR, nonspecific previously containing gas in the immediate postoperative state. Vascular/Lymphatic: Moderate calcific and noncalcific atherosclerosis. No signs of aneurysm. No signs of retroperitoneal or upper abdominal lymphadenopathy. No signs of pelvic lymphadenopathy. Reproductive: Uterus remains in place following APR and reported vaginectomy. Other: No signs of ascites. No peritoneal or omental nodularity. Musculoskeletal: No signs of acute bone finding or evidence of destructive bone process. Spinal curvature as before. IMPRESSION: 1. Slight enlargement of right lower lobe nodules and the largest right upper lobe nodule good evening particularly the lobulated nodule in the right lower lobe as described. 2. Soft tissue thickening along the site of APR is nonspecific previously containing gas in the immediate postoperative state. This will serve as a baseline for future follow-up. 3. Emphysema and aortic atherosclerosis. Aortic Atherosclerosis (ICD10-I70.0) and Emphysema (ICD10-J43.9). Electronically Signed   By: GZetta BillsM.D.   On: 05/14/2019 22:13    Medications: I have  reviewed the patient's current medications.   Assessment/Plan: 1. Rectal cancer-rectal mass noted on digital exam 02/10/2018, colonoscopy confirmed a him my circumferential mass in the rectum ? 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 CTs 02/10/2018-anterior rectal wall thickening, pulmonary nodules measuring up to 9 mm concerning for metastases, few round perirectal lymph nodes ? Pelvic MRI 02/20/2018-hypermetabolic low rectal mass extending to the posterior vagina with 2 small enlarged perirectal lymph nodes, T4b,N1-2.3 cm from the anal verge ? PET scan 02/23/2018-hypermetabolic rectal mass, 8 mm hypermetabolic lingular nodule, scattered small bilateral lung nodules, some calcified, a few with mildly increased activity ? Cycle 1 FOLFOXIRI8/21/2019 ? Cycle 5 FOLFOXIRI10/16/2019 ? CTs 05/01/2018-significant interval response to therapy with decreased size of the primary rectal mass lesion. Decreasing perirectal lymphadenopathy. Decreased and/or resolved pulmonary nodules. No new sites of disease identified. ? Cycle 6 FOLFOXIRI10/31/2019 ? Radiation/Xeloda 06/12/2018-completed 07/21/2018 ? Xeloda dose reduced 07/10/2018 due to mucositis and diarrhea ? CTs 08/07/2018-compared to 02/10/2018-resolved and decreased pulmonary nodules, few tiny residual noncalcified nodules, decreased 20 perirectal lymph nodes, soft tissue of the rectum indistinguishable from posterior wall of vagina-Vaginal involvement by tumor? ? APR/vaginectomy 09/01/2018-ypT4,ypNo, negative resection margins, involvement of the vagina per review of slides at GI tumor conference,notreatment effect-tumor regression score 3,no loss of mismatch repair protein expression, MSI-stable ? [id number]  ? CT chest 09/29/2018-enlargement of lung nodules, not a candidate for SBRT based on discussion in GI tumor conference and with radiation oncology ? CT chest 11/27/18 - interval growth of numerous pulmonary metastases bilaterally compared to 09/29/18, no new pulmonary mets ? Cycle 1FOLFOXIRI 11/29/18 ? Cycle 2 FOLFOXIRI6/10/2018 ? Cycle 3 FOLFOXIRI 12/28/2018 ? Cycle 4 FOLFIRINOX 01/11/2019 ? CTs 01/24/2019-significant improvement of bilateral pulmonary metastases. ? Cycle 1 FOLFIRI 02/01/2019 ? Cycle 2 FOLFIRI 02/22/2019 ? Cycle 3 FOLFIRI 03/15/2019 ? Cycle 4 FOLFIRI 04/05/2019 ? Cycle 5 FOLFIRI 04/26/2019 ? CTs 05/14/2019-slight enlargement of right lung nodules, no other evidence of disease progression ? 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 Imodium as needed. 6.Mucositis secondary to Xeloda. Improved 07/19/2018. 7. History of migraines     Disposition: Ms. Meece appears unchanged.  I reviewed the CT results with her.  We reviewed the images.  A few of the right-sided lung nodules have slightly enlarged compared to the last CT, no other evidence of disease progression.  The lung nodules remain improved compared to the CT in May.  We discussed treatment options including a treatment break, continuing FOLFIRI, adding Avastin to the FOLFIRI regimen, and going back to an oxaliplatin-based regimen.  We also discussed referral to consider clinical trial.  She does not wish to have a second opinion at present.  We decided to continue FOLFIRI and add Avastin.  She will complete cycle of FOLFIRI today.  Avastin will be added to the chemotherapy regimen when she returns in 3 weeks.  We reviewed potential toxicities associated with Avastin including the chance of hypertension, CNS toxicity,  thromboembolic disease, bleeding, delayed wound healing, bowel perforation, and nephrotoxicity.  She agrees to proceed.  Betsy Coder, MD  05/17/2019  9:53 AM

## 2019-05-17 NOTE — Patient Instructions (Signed)
[facility] Discharge Instructions for Patients Receiving Chemotherapy  Today you received the following chemotherapy agents Irinotecan (CAMPTOSAR), Leucovorin & Flourouracil (ADRUCIL).  To help prevent nausea and vomiting after your treatment, we encourage you to take your nausea medication as prescribed.    If you develop nausea and vomiting that is not controlled by your nausea medication, call the clinic.   BELOW ARE SYMPTOMS THAT SHOULD BE REPORTED IMMEDIATELY:  *FEVER GREATER THAN 100.5 F  *CHILLS WITH OR WITHOUT FEVER  NAUSEA AND VOMITING THAT IS NOT CONTROLLED WITH YOUR NAUSEA MEDICATION  *UNUSUAL SHORTNESS OF BREATH  *UNUSUAL BRUISING OR BLEEDING  TENDERNESS IN MOUTH AND THROAT WITH OR WITHOUT PRESENCE OF ULCERS  *URINARY PROBLEMS  *BOWEL PROBLEMS  UNUSUAL RASH Items with * indicate a potential emergency and should be followed up as soon as possible.  Feel free to call the clinic should you have any questions or concerns. The clinic phone number is [phone].  Please show the CHEMO ALERT CARD at check-in to the Emergency Department and triage nurse.   

## 2019-05-18 ENCOUNTER — Telehealth: Payer: Self-pay | Admitting: *Deleted

## 2019-05-18 DIAGNOSIS — C20 Malignant neoplasm of rectum: Secondary | ICD-10-CM

## 2019-05-18 NOTE — Telephone Encounter (Signed)
Per Dr. Benay Spice, called pt regarding low normal mag results and for pt to decrease magnesium to 400 mg 2x daily and magnesium will be repeated at next visit. Vmail was left for pt and to call with other questions and concerns.

## 2019-05-18 NOTE — Telephone Encounter (Signed)
-----   Message from Ladell Pier, MD sent at 05/17/2019  5:16 PM EST ----- Please call patient, magnesium is in the low normal range, can decrease the magnesium to 400 mg twice daily, will repeat at next visit

## 2019-05-18 NOTE — Telephone Encounter (Addendum)
Called to confirm that she got message to decrease MagOxide to #1 tab bid. Record had each tablet at 400 mg, but are actually is 200 mg, so she was on 400 mg bid. Does he want her to decrease to 200 mg bid? Also wanted him aware that she sees the pills come out in her stool undigested frequently.  Per Dr. Benay Spice: Take 200 mg bid for now. Sent referral to dietician to assist with teaching high Mg+ diet.

## 2019-05-19 ENCOUNTER — Inpatient Hospital Stay: Payer: BC Managed Care – PPO

## 2019-05-19 VITALS — BP 130/85 | HR 73 | Temp 98.9°F | Resp 18

## 2019-05-19 DIAGNOSIS — C20 Malignant neoplasm of rectum: Secondary | ICD-10-CM | POA: Diagnosis not present

## 2019-05-19 MED ORDER — HEPARIN SOD (PORK) LOCK FLUSH 100 UNIT/ML IV SOLN
500.0000 [IU] | Freq: Once | INTRAVENOUS | Status: AC | PRN
  Administered 2019-05-19: 500 [IU]
  Filled 2019-05-19: qty 5

## 2019-05-19 MED ORDER — SODIUM CHLORIDE 0.9% FLUSH
10.0000 mL | INTRAVENOUS | Status: DC | PRN
  Administered 2019-05-19: 10 mL
  Filled 2019-05-19: qty 10

## 2019-05-19 NOTE — Patient Instructions (Signed)

## 2019-05-21 ENCOUNTER — Telehealth: Payer: Self-pay | Admitting: Oncology

## 2019-05-21 ENCOUNTER — Telehealth: Payer: Self-pay | Admitting: *Deleted

## 2019-05-21 DIAGNOSIS — C20 Malignant neoplasm of rectum: Secondary | ICD-10-CM

## 2019-05-21 MED ORDER — DEXAMETHASONE 4 MG PO TABS: ORAL_TABLET | ORAL | 1 refills | Status: DC

## 2019-05-21 NOTE — Telephone Encounter (Signed)
Called patient with clarification from Dr. Benay Spice: Take magoxide 200 mg bid for now. She is also requesting refill on her decadron for next cycle. Reports she has had two episodes of her infusion pump beeping when she is at home with no message across screen to alert her to what is wrong. She is unsure she has the phone # to call for pump issues. Will provide this at next visit.

## 2019-05-21 NOTE — Telephone Encounter (Signed)
Called and spoke with patient. Confirmed appts  °

## 2019-05-28 ENCOUNTER — Telehealth: Payer: Self-pay | Admitting: *Deleted

## 2019-05-28 NOTE — Telephone Encounter (Signed)
Left VM asking to delay her chemo on 06/06/19. Does not want to have infusion pump over Thanksgiving. Attempted to reach her to inquire when she wishes to reschedule. No answer or machine. Sent Mychart message.

## 2019-05-29 NOTE — Telephone Encounter (Signed)
Left message that she wants to move her 11/25 appointments to 12/3. Scheduling message sent.

## 2019-05-30 ENCOUNTER — Inpatient Hospital Stay: Payer: BC Managed Care – PPO

## 2019-05-30 ENCOUNTER — Telehealth: Payer: Self-pay | Admitting: Oncology

## 2019-05-30 NOTE — Telephone Encounter (Signed)
Scheduled appt per 11/17 sch message - pt is aware of new appt date and time   

## 2019-05-30 NOTE — Progress Notes (Signed)
Nutrition Assessment   Reason for Assessment:  Education on foods high in Magnesium   ASSESSMENT:  53 year old female with rectal cancer since 2019 with lung nodules.  Patient receiving folfiri.    Spoke with patient via phone.  Patient reports appetite is fairly good.  Reports that most often for breakfast will eat eggs, toast, sometimes yogurt.  Lunch is usually sandwich or soup and dinner is usually chicken, vegetables, spaghetti.  Does not really eat much red meat.  Reports that she sometimes drinks premier protein shakes.   Has issues with constipation.      Nutrition Focused Physical Exam: deferred   Medications: decadron, Mag oxide, prilosec, KCL, compazine   Labs: glucose 111   Anthropometrics:   Height: 67 inches Weight: 132 lb 1.6 oz (10/5) Noted July 2020 in 120s BMI: 20  Patient reports feels like steroids have increased weight    NUTRITION DIAGNOSIS: Food and nutrition related knowledge deficit related to cancer related treatment side effects (low Magnesium) as evidenced by referral to RD for education   INTERVENTION:  Reviewed foods high in magnesium.  Will mail handout Encouraged good sources of protein and calories to prevent weight loss Contact information provided to patient.    MONITORING, EVALUATION, GOAL: Patient will consume adequate calories and protein to prevent weight loss   Next Visit: no follow-up, patient to contact RD if needed  Irina Okelly B. Zenia Resides, [place], [place] Registered Dietitian [phone] (pager)

## 2019-06-06 ENCOUNTER — Ambulatory Visit: Payer: BC Managed Care – PPO

## 2019-06-06 ENCOUNTER — Ambulatory Visit: Payer: BC Managed Care – PPO | Admitting: Oncology

## 2019-06-06 ENCOUNTER — Other Ambulatory Visit: Payer: BC Managed Care – PPO

## 2019-06-10 ENCOUNTER — Other Ambulatory Visit: Payer: Self-pay | Admitting: Oncology

## 2019-06-12 ENCOUNTER — Other Ambulatory Visit: Payer: Self-pay | Admitting: *Deleted

## 2019-06-12 DIAGNOSIS — C20 Malignant neoplasm of rectum: Secondary | ICD-10-CM

## 2019-06-13 ENCOUNTER — Other Ambulatory Visit: Payer: Self-pay

## 2019-06-13 ENCOUNTER — Inpatient Hospital Stay: Payer: BC Managed Care – PPO | Attending: Oncology

## 2019-06-13 ENCOUNTER — Inpatient Hospital Stay (HOSPITAL_BASED_OUTPATIENT_CLINIC_OR_DEPARTMENT_OTHER): Payer: BC Managed Care – PPO | Admitting: Oncology

## 2019-06-13 ENCOUNTER — Inpatient Hospital Stay: Payer: BC Managed Care – PPO

## 2019-06-13 VITALS — BP 116/89 | HR 107 | Temp 98.7°F | Resp 18 | Ht 67.0 in | Wt 129.9 lb

## 2019-06-13 VITALS — HR 68

## 2019-06-13 DIAGNOSIS — Z5111 Encounter for antineoplastic chemotherapy: Secondary | ICD-10-CM | POA: Insufficient documentation

## 2019-06-13 DIAGNOSIS — C20 Malignant neoplasm of rectum: Secondary | ICD-10-CM

## 2019-06-13 DIAGNOSIS — C7802 Secondary malignant neoplasm of left lung: Secondary | ICD-10-CM | POA: Insufficient documentation

## 2019-06-13 DIAGNOSIS — Z95828 Presence of other vascular implants and grafts: Secondary | ICD-10-CM

## 2019-06-13 DIAGNOSIS — C7801 Secondary malignant neoplasm of right lung: Secondary | ICD-10-CM | POA: Diagnosis present

## 2019-06-13 DIAGNOSIS — Z23 Encounter for immunization: Secondary | ICD-10-CM

## 2019-06-13 DIAGNOSIS — Z5112 Encounter for antineoplastic immunotherapy: Secondary | ICD-10-CM | POA: Insufficient documentation

## 2019-06-13 LAB — CBC WITH DIFFERENTIAL (CANCER CENTER ONLY)
Abs Immature Granulocytes: 0.07 10*3/uL (ref 0.00–0.07)
Basophils Absolute: 0 10*3/uL (ref 0.0–0.1)
Basophils Relative: 1 %
Eosinophils Absolute: 0.1 10*3/uL (ref 0.0–0.5)
Eosinophils Relative: 1 %
HCT: 35.5 % — ABNORMAL LOW (ref 36.0–46.0)
Hemoglobin: 11 g/dL — ABNORMAL LOW (ref 12.0–15.0)
Immature Granulocytes: 1 %
Lymphocytes Relative: 15 %
Lymphs Abs: 0.9 10*3/uL (ref 0.7–4.0)
MCH: 20.8 pg — ABNORMAL LOW (ref 26.0–34.0)
MCHC: 31 g/dL (ref 30.0–36.0)
MCV: 67.2 fL — ABNORMAL LOW (ref 80.0–100.0)
Monocytes Absolute: 0.6 10*3/uL (ref 0.1–1.0)
Monocytes Relative: 11 %
Neutro Abs: 3.9 10*3/uL (ref 1.7–7.7)
Neutrophils Relative %: 71 %
Platelet Count: 265 10*3/uL (ref 150–400)
RBC: 5.28 MIL/uL — ABNORMAL HIGH (ref 3.87–5.11)
RDW: 15.1 % (ref 11.5–15.5)
WBC Count: 5.6 10*3/uL (ref 4.0–10.5)
nRBC: 0 % (ref 0.0–0.2)

## 2019-06-13 LAB — CMP (CANCER CENTER ONLY)
ALT: 13 U/L (ref 0–44)
AST: 21 U/L (ref 15–41)
Albumin: 4 g/dL (ref 3.5–5.0)
Alkaline Phosphatase: 154 U/L — ABNORMAL HIGH (ref 38–126)
Anion gap: 10 (ref 5–15)
BUN: 10 mg/dL (ref 6–20)
CO2: 22 mmol/L (ref 22–32)
Calcium: 9.1 mg/dL (ref 8.9–10.3)
Chloride: 101 mmol/L (ref 98–111)
Creatinine: 0.67 mg/dL (ref 0.44–1.00)
GFR, Est AFR Am: >60 mL/min (ref >60)
GFR, Estimated: >60 mL/min (ref >60)
Glucose, Bld: 98 mg/dL (ref 70–99)
Potassium: 4.7 mmol/L (ref 3.5–5.1)
Sodium: 133 mmol/L — ABNORMAL LOW (ref 135–145)
Total Bilirubin: 0.4 mg/dL (ref 0.3–1.2)
Total Protein: 6.6 g/dL (ref 6.5–8.1)

## 2019-06-13 LAB — MAGNESIUM: Magnesium: 2.2 mg/dL (ref 1.7–2.4)

## 2019-06-13 LAB — TOTAL PROTEIN, URINE DIPSTICK: Protein, ur: NEGATIVE mg/dL

## 2019-06-13 LAB — CEA (IN HOUSE-CHCC): CEA (CHCC-In House): 1.95 ng/mL (ref 0.00–5.00)

## 2019-06-13 MED ORDER — DEXTROSE 5 % IV SOLN
Freq: Once | INTRAVENOUS | Status: DC
  Filled 2019-06-13: qty 250

## 2019-06-13 MED ORDER — SODIUM CHLORIDE 0.9 % IV SOLN
Freq: Once | INTRAVENOUS | Status: AC
  Administered 2019-06-13: 10:00:00 via INTRAVENOUS
  Filled 2019-06-13: qty 250

## 2019-06-13 MED ORDER — INFLUENZA VAC SPLIT QUAD 0.5 ML IM SUSY
0.5000 mL | PREFILLED_SYRINGE | Freq: Once | INTRAMUSCULAR | Status: AC
  Administered 2019-06-13: 0.5 mL via INTRAMUSCULAR

## 2019-06-13 MED ORDER — IRINOTECAN HCL CHEMO INJECTION 100 MG/5ML
150.0000 mg/m2 | Freq: Once | INTRAVENOUS | Status: AC
  Administered 2019-06-13: 240 mg via INTRAVENOUS
  Filled 2019-06-13: qty 12

## 2019-06-13 MED ORDER — PALONOSETRON HCL INJECTION 0.25 MG/5ML
0.2500 mg | Freq: Once | INTRAVENOUS | Status: AC
  Administered 2019-06-13: 0.25 mg via INTRAVENOUS

## 2019-06-13 MED ORDER — INFLUENZA VAC SPLIT QUAD 0.5 ML IM SUSY
PREFILLED_SYRINGE | INTRAMUSCULAR | Status: AC
  Filled 2019-06-13: qty 0.5

## 2019-06-13 MED ORDER — SODIUM CHLORIDE 0.9 % IV SOLN
INTRAVENOUS | Status: DC
  Administered 2019-06-13: 11:00:00 via INTRAVENOUS
  Filled 2019-06-13: qty 250

## 2019-06-13 MED ORDER — SODIUM CHLORIDE 0.9% FLUSH
10.0000 mL | Freq: Once | INTRAVENOUS | Status: AC
  Administered 2019-06-13: 10 mL
  Filled 2019-06-13: qty 10

## 2019-06-13 MED ORDER — SODIUM CHLORIDE 0.9 % IV SOLN
Freq: Once | INTRAVENOUS | Status: AC
  Administered 2019-06-13: 10:00:00 via INTRAVENOUS
  Filled 2019-06-13: qty 5

## 2019-06-13 MED ORDER — LEUCOVORIN CALCIUM INJECTION 350 MG
200.0000 mg/m2 | Freq: Once | INTRAVENOUS | Status: AC
  Administered 2019-06-13: 318 mg via INTRAVENOUS
  Filled 2019-06-13: qty 15.9

## 2019-06-13 MED ORDER — SODIUM CHLORIDE 0.9 % IV SOLN
3200.0000 mg/m2 | INTRAVENOUS | Status: DC
  Administered 2019-06-13: 5100 mg via INTRAVENOUS
  Filled 2019-06-13: qty 102

## 2019-06-13 MED ORDER — SODIUM CHLORIDE 0.9 % IV SOLN
7.5000 mg/kg | Freq: Once | INTRAVENOUS | Status: AC
  Administered 2019-06-13: 450 mg via INTRAVENOUS
  Filled 2019-06-13: qty 16

## 2019-06-13 MED ORDER — PALONOSETRON HCL INJECTION 0.25 MG/5ML
INTRAVENOUS | Status: AC
  Filled 2019-06-13: qty 5

## 2019-06-13 NOTE — Patient Instructions (Signed)
[facility] Discharge Instructions for Patients Receiving Chemotherapy  Today you received the following chemotherapy agents:  Bevacizumab, Irinotecan, Leucovorin, Fluorouracil  To help prevent nausea and vomiting after your treatment, we encourage you to take your nausea medication as prescribed.   If you develop nausea and vomiting that is not controlled by your nausea medication, call the clinic.   BELOW ARE SYMPTOMS THAT SHOULD BE REPORTED IMMEDIATELY:  *FEVER GREATER THAN 100.5 F  *CHILLS WITH OR WITHOUT FEVER  NAUSEA AND VOMITING THAT IS NOT CONTROLLED WITH YOUR NAUSEA MEDICATION  *UNUSUAL SHORTNESS OF BREATH  *UNUSUAL BRUISING OR BLEEDING  TENDERNESS IN MOUTH AND THROAT WITH OR WITHOUT PRESENCE OF ULCERS  *URINARY PROBLEMS  *BOWEL PROBLEMS  UNUSUAL RASH Items with * indicate a potential emergency and should be followed up as soon as possible.  Feel free to call the clinic should you have any questions or concerns. The clinic phone number is [phone].  Please show the [facility] at check-in to the Emergency Department and triage nurse.

## 2019-06-13 NOTE — Progress Notes (Signed)
Tina Morales   Diagnosis: Rectal cancer  INTERVAL HISTORY:   Tina Morales completed another cycle of FOLFIRI on 05/17/2019.  Mild nausea following chemotherapy.  Mild mouth soreness.  Tina Morales complains of malaise over the week following chemotherapy.  Tina Morales has minimal remaining tingling in the feet.  Tina Morales takes extra MiraLAX following chemotherapy to relieve constipation.  Tina Morales had cramping abdominal discomfort during the irinotecan infusion.  Objective:  Vital signs in last 24 hours:  Blood pressure 116/89, pulse (!) 107, temperature 98.7 F (37.1 C), temperature source Temporal, resp. rate 18, height 5' 7"  (1.702 m), weight 129 lb 14.4 oz (58.9 kg), SpO2 100 %.    GI: No hepatomegaly, nontender, formed stool in the left lower quadrant Vascular: No leg edema  Skin: Palms without erythema  Portacath/PICC-without erythema  Lab Results:  Lab Results  Component Value Date   WBC 5.6 06/13/2019   HGB 11.0 (L) 06/13/2019   HCT 35.5 (L) 06/13/2019   MCV 67.2 (L) 06/13/2019   PLT 265 06/13/2019   NEUTROABS 3.9 06/13/2019    CMP  Lab Results  Component Value Date   NA 138 05/17/2019   K 4.1 05/17/2019   CL 104 05/17/2019   CO2 24 05/17/2019   GLUCOSE 111 (H) 05/17/2019   BUN <4 (L) 05/17/2019   CREATININE 0.62 05/17/2019   CALCIUM 8.8 (L) 05/17/2019   PROT 5.9 (L) 05/17/2019   ALBUMIN 3.7 05/17/2019   AST 14 (L) 05/17/2019   ALT 8 05/17/2019   ALKPHOS 118 05/17/2019   BILITOT 0.3 05/17/2019   GFRNONAA >60 05/17/2019   GFRAA >60 05/17/2019    Lab Results  Component Value Date   CEA1 1.80 04/26/2019     Medications: I have reviewed the patient's current medications.   Assessment/Plan: 1. Rectal cancer-rectal mass noted on digital exam 02/10/2018, colonoscopy confirmed a him my circumferential mass in the rectum ? 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 CTs 02/10/2018-anterior rectal wall thickening, pulmonary nodules measuring up to 9 mm concerning for metastases, few round perirectal lymph nodes ? Pelvic MRI 02/20/2018-hypermetabolic low rectal mass extending to the posterior vagina with 2 small enlarged perirectal lymph nodes, T4b,N1-2.3 cm from the anal verge ? PET scan 02/23/2018-hypermetabolic rectal mass, 8 mm hypermetabolic lingular nodule, scattered small bilateral lung nodules, some calcified, a few with mildly increased activity ? Cycle 1 FOLFOXIRI8/21/2019 ? Cycle 5 FOLFOXIRI10/16/2019 ? CTs 05/01/2018-significant interval response to therapy with decreased size of the primary rectal mass lesion. Decreasing perirectal lymphadenopathy. Decreased and/or resolved pulmonary nodules. No new sites of disease identified. ? Cycle 6 FOLFOXIRI10/31/2019 ? Radiation/Xeloda 06/12/2018-completed 07/21/2018 ? Xeloda dose reduced 07/10/2018 due to mucositis and diarrhea ? CTs 08/07/2018-compared to 02/10/2018-resolved and decreased pulmonary nodules, few tiny residual noncalcified nodules, decreased 20 perirectal lymph nodes, soft tissue of the rectum indistinguishable from posterior wall of vagina-Vaginal involvement by tumor? ? 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 CT chest 09/29/2018-enlargement of lung nodules, not a candidate for SBRT based on discussion in GI tumor conference and with radiation oncology ? CT chest 11/27/18 - interval growth of numerous pulmonary metastases bilaterally compared to 09/29/18, no new pulmonary mets ? Cycle  1FOLFOXIRI 11/29/18 ? Cycle  2 FOLFOXIRI6/10/2018 ? Cycle 3 FOLFOXIRI 12/28/2018 ? Cycle 4 FOLFIRINOX 01/11/2019 ? CTs 01/24/2019-significant improvement of bilateral pulmonary metastases. ? Cycle 1 FOLFIRI 02/01/2019 ? Cycle 2 FOLFIRI 02/22/2019 ? Cycle 3 FOLFIRI 03/15/2019 ? Cycle 4 FOLFIRI 04/05/2019 ? Cycle 5 FOLFIRI 04/26/2019 ? CTs 05/14/2019-slight enlargement of right lung nodules, no other evidence of disease progression ? Cycle 6 FOLFIRI 05/17/2019 ? 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 Imodium as needed. 6.Mucositis secondary to Xeloda. Improved 07/19/2018. 7. History of migraines   Disposition: Tina Morales appears stable.  Tina Morales will complete a another cycle of FOLFIRI today.  Avastin will be added to the systemic therapy regimen.  We reviewed potential toxicities associated with Avastin.  Tina Morales agrees to proceed.  Tina Morales will return for an office visit and the next cycle of FOLFIRI/Avastin on 07/09/2019.  Tina Morales will try appropriate for the cramping during the irinotecan infusion.  Tina Morales does not wish to receive atropine due to constipation.  Betsy Coder, MD  06/13/2019  8:44 AM

## 2019-06-13 NOTE — Progress Notes (Signed)
Per Dr. Benay Spice: Does not require urine protein today for 1st avastin tx. Will collect next tx.

## 2019-06-14 ENCOUNTER — Ambulatory Visit: Payer: BC Managed Care – PPO | Admitting: Oncology

## 2019-06-14 ENCOUNTER — Other Ambulatory Visit: Payer: BC Managed Care – PPO

## 2019-06-14 ENCOUNTER — Telehealth: Payer: Self-pay | Admitting: *Deleted

## 2019-06-15 ENCOUNTER — Inpatient Hospital Stay: Payer: BC Managed Care – PPO

## 2019-06-15 ENCOUNTER — Other Ambulatory Visit: Payer: Self-pay

## 2019-06-15 VITALS — BP 128/68 | HR 67 | Temp 98.2°F | Resp 18

## 2019-06-15 DIAGNOSIS — C20 Malignant neoplasm of rectum: Secondary | ICD-10-CM | POA: Diagnosis not present

## 2019-06-15 MED ORDER — HEPARIN SOD (PORK) LOCK FLUSH 100 UNIT/ML IV SOLN
500.0000 [IU] | Freq: Once | INTRAVENOUS | Status: AC | PRN
  Administered 2019-06-15: 500 [IU]
  Filled 2019-06-15: qty 5

## 2019-06-15 MED ORDER — SODIUM CHLORIDE 0.9% FLUSH
10.0000 mL | INTRAVENOUS | Status: DC | PRN
  Administered 2019-06-15: 10 mL
  Filled 2019-06-15: qty 10

## 2019-06-19 ENCOUNTER — Other Ambulatory Visit: Payer: Self-pay | Admitting: *Deleted

## 2019-06-19 DIAGNOSIS — C20 Malignant neoplasm of rectum: Secondary | ICD-10-CM

## 2019-06-19 MED ORDER — ALPRAZOLAM 0.5 MG PO TABS: 0.5000 mg | ORAL_TABLET | Freq: Two times a day (BID) | ORAL | 0 refills | Status: DC | PRN

## 2019-06-19 NOTE — Telephone Encounter (Signed)
Requesting refill on xanax. Script called in.

## 2019-07-08 ENCOUNTER — Other Ambulatory Visit: Payer: Self-pay | Admitting: Oncology

## 2019-07-09 ENCOUNTER — Telehealth: Payer: Self-pay

## 2019-07-09 ENCOUNTER — Inpatient Hospital Stay: Payer: BC Managed Care – PPO

## 2019-07-09 ENCOUNTER — Other Ambulatory Visit: Payer: Self-pay

## 2019-07-09 ENCOUNTER — Inpatient Hospital Stay (HOSPITAL_BASED_OUTPATIENT_CLINIC_OR_DEPARTMENT_OTHER): Payer: BC Managed Care – PPO | Admitting: Nurse Practitioner

## 2019-07-09 ENCOUNTER — Encounter: Payer: Self-pay | Admitting: Nurse Practitioner

## 2019-07-09 VITALS — BP 134/81 | Wt 133.5 lb

## 2019-07-09 VITALS — BP 129/85 | HR 89 | Temp 98.9°F | Resp 18

## 2019-07-09 DIAGNOSIS — Z95828 Presence of other vascular implants and grafts: Secondary | ICD-10-CM

## 2019-07-09 DIAGNOSIS — C20 Malignant neoplasm of rectum: Secondary | ICD-10-CM

## 2019-07-09 LAB — CBC WITH DIFFERENTIAL (CANCER CENTER ONLY)
Abs Immature Granulocytes: 0.05 10*3/uL (ref 0.00–0.07)
Basophils Absolute: 0 10*3/uL (ref 0.0–0.1)
Basophils Relative: 0 %
Eosinophils Absolute: 0.1 10*3/uL (ref 0.0–0.5)
Eosinophils Relative: 2 %
HCT: 32.7 % — ABNORMAL LOW (ref 36.0–46.0)
Hemoglobin: 10.2 g/dL — ABNORMAL LOW (ref 12.0–15.0)
Immature Granulocytes: 1 %
Lymphocytes Relative: 15 %
Lymphs Abs: 0.9 10*3/uL (ref 0.7–4.0)
MCH: 20.4 pg — ABNORMAL LOW (ref 26.0–34.0)
MCHC: 31.2 g/dL (ref 30.0–36.0)
MCV: 65.4 fL — ABNORMAL LOW (ref 80.0–100.0)
Monocytes Absolute: 0.6 10*3/uL (ref 0.1–1.0)
Monocytes Relative: 10 %
Neutro Abs: 4.4 10*3/uL (ref 1.7–7.7)
Neutrophils Relative %: 72 %
Platelet Count: 302 10*3/uL (ref 150–400)
RBC: 5 MIL/uL (ref 3.87–5.11)
RDW: 15.9 % — ABNORMAL HIGH (ref 11.5–15.5)
WBC Count: 6.1 10*3/uL (ref 4.0–10.5)
nRBC: 0 % (ref 0.0–0.2)

## 2019-07-09 LAB — TOTAL PROTEIN, URINE DIPSTICK: Protein, ur: NEGATIVE mg/dL

## 2019-07-09 LAB — CMP (CANCER CENTER ONLY)
ALT: 10 U/L (ref 0–44)
AST: 18 U/L (ref 15–41)
Albumin: 3.9 g/dL (ref 3.5–5.0)
Alkaline Phosphatase: 137 U/L — ABNORMAL HIGH (ref 38–126)
Anion gap: 10 (ref 5–15)
BUN: 9 mg/dL (ref 6–20)
CO2: 23 mmol/L (ref 22–32)
Calcium: 8.8 mg/dL — ABNORMAL LOW (ref 8.9–10.3)
Chloride: 101 mmol/L (ref 98–111)
Creatinine: 0.62 mg/dL (ref 0.44–1.00)
GFR, Est AFR Am: >60 mL/min (ref >60)
GFR, Estimated: >60 mL/min (ref >60)
Glucose, Bld: 92 mg/dL (ref 70–99)
Potassium: 4.7 mmol/L (ref 3.5–5.1)
Sodium: 134 mmol/L — ABNORMAL LOW (ref 135–145)
Total Bilirubin: 0.3 mg/dL (ref 0.3–1.2)
Total Protein: 6.5 g/dL (ref 6.5–8.1)

## 2019-07-09 LAB — MAGNESIUM: Magnesium: 2.1 mg/dL (ref 1.7–2.4)

## 2019-07-09 MED ORDER — SODIUM CHLORIDE 0.9 % IV SOLN
3200.0000 mg/m2 | INTRAVENOUS | Status: DC
  Administered 2019-07-09: 5100 mg via INTRAVENOUS
  Filled 2019-07-09: qty 102

## 2019-07-09 MED ORDER — POTASSIUM CHLORIDE CRYS ER 20 MEQ PO TBCR: 20.0000 meq | EXTENDED_RELEASE_TABLET | Freq: Two times a day (BID) | ORAL | 1 refills | Status: DC

## 2019-07-09 MED ORDER — SODIUM CHLORIDE 0.9 % IV SOLN
7.5000 mg/kg | Freq: Once | INTRAVENOUS | Status: AC
  Administered 2019-07-09: 450 mg via INTRAVENOUS
  Filled 2019-07-09: qty 16

## 2019-07-09 MED ORDER — PALONOSETRON HCL INJECTION 0.25 MG/5ML
INTRAVENOUS | Status: AC
  Filled 2019-07-09: qty 5

## 2019-07-09 MED ORDER — SODIUM CHLORIDE 0.9% FLUSH
10.0000 mL | Freq: Once | INTRAVENOUS | Status: AC
  Administered 2019-07-09: 10 mL
  Filled 2019-07-09: qty 10

## 2019-07-09 MED ORDER — IRINOTECAN HCL CHEMO INJECTION 100 MG/5ML
150.0000 mg/m2 | Freq: Once | INTRAVENOUS | Status: AC
  Administered 2019-07-09: 240 mg via INTRAVENOUS
  Filled 2019-07-09: qty 12

## 2019-07-09 MED ORDER — ALPRAZOLAM 0.5 MG PO TABS: 0.5000 mg | ORAL_TABLET | Freq: Two times a day (BID) | ORAL | 0 refills | Status: DC | PRN

## 2019-07-09 MED ORDER — LEUCOVORIN CALCIUM INJECTION 350 MG
200.0000 mg/m2 | Freq: Once | INTRAVENOUS | Status: AC
  Administered 2019-07-09: 318 mg via INTRAVENOUS
  Filled 2019-07-09: qty 15.9

## 2019-07-09 MED ORDER — SODIUM CHLORIDE 0.9 % IV SOLN
Freq: Once | INTRAVENOUS | Status: AC
  Filled 2019-07-09: qty 250

## 2019-07-09 MED ORDER — PALONOSETRON HCL INJECTION 0.25 MG/5ML
0.2500 mg | Freq: Once | INTRAVENOUS | Status: AC
  Administered 2019-07-09: 0.25 mg via INTRAVENOUS

## 2019-07-09 MED ORDER — SODIUM CHLORIDE 0.9 % IV SOLN
Freq: Once | INTRAVENOUS | Status: AC
  Filled 2019-07-09: qty 5

## 2019-07-09 NOTE — Addendum Note (Signed)
Addended by: Owens Shark on: 07/09/2019 10:04 AM   Modules accepted: Orders

## 2019-07-09 NOTE — Telephone Encounter (Signed)
TC per Ned Card NP to call in refilled for Magic mouth wash solution for patient. Called it into [facility] [phone]

## 2019-07-09 NOTE — Patient Instructions (Addendum)
[facility] Discharge Instructions for Patients Receiving Chemotherapy  Today you received the following chemotherapy agents: Avastin/Irinotecan/Leucovorin/Adrucil.  To help prevent nausea and vomiting after your treatment, we encourage you to take your nausea medication as directed.   If you develop nausea and vomiting that is not controlled by your nausea medication, call the clinic.   BELOW ARE SYMPTOMS THAT SHOULD BE REPORTED IMMEDIATELY:  *FEVER GREATER THAN 100.5 F  *CHILLS WITH OR WITHOUT FEVER  NAUSEA AND VOMITING THAT IS NOT CONTROLLED WITH YOUR NAUSEA MEDICATION  *UNUSUAL SHORTNESS OF BREATH  *UNUSUAL BRUISING OR BLEEDING  TENDERNESS IN MOUTH AND THROAT WITH OR WITHOUT PRESENCE OF ULCERS  *URINARY PROBLEMS  *BOWEL PROBLEMS  UNUSUAL RASH Items with * indicate a potential emergency and should be followed up as soon as possible.  Feel free to call the clinic should you have any questions or concerns. The clinic phone number is [phone].  Please show the [facility] at check-in to the Emergency Department and triage nurse.  Advance Directive  Advance directives are legal documents that let you make choices ahead of time about your health care and medical treatment in case you become unable to communicate for yourself. Advance directives are a way for you to communicate your wishes to family, friends, and health care providers. This can help convey your decisions about end-of-life care if you become unable to communicate. Discussing and writing advance directives should happen over time rather than all at once. Advance directives can be changed depending on your situation and what you want, even after you have signed the advance directives. If you do not have an advance directive, some states assign family decision makers to act on your behalf based on how closely you are related to them. Each state has its own laws regarding advance directives. You  may want to check with your health care provider, attorney, or state representative about the laws in your state. There are different types of advance directives, such as:  Medical power of attorney.  Living will.  Do not resuscitate (DNR) or do not attempt resuscitation (DNAR) order. Health care proxy and medical power of attorney A health care proxy, also called a health care agent, is a person who is appointed to make medical decisions for you in cases in which you are unable to make the decisions yourself. Generally, people choose someone they know well and trust to represent their preferences. Make sure to ask this person for an agreement to act as your proxy. A proxy may have to exercise judgment in the event of a medical decision for which your wishes are not known. A medical power of attorney is a legal document that names your health care proxy. Depending on the laws in your state, after the document is written, it may also need to be:  Signed.  Notarized.  Dated.  Copied.  Witnessed.  Incorporated into your medical record. You may also want to appoint someone to manage your financial affairs in a situation in which you are unable to do so. This is called a durable power of attorney for finances. It is a separate legal document from the durable power of attorney for health care. You may choose the same person or someone different from your health care proxy to act as your agent in financial matters. If you do not appoint a proxy, or if there is a concern that the proxy is not acting in your best interests, a court-appointed guardian may  be designated to act on your behalf. Living will A living will is a set of instructions documenting your wishes about medical care when you cannot express them yourself. Health care providers should keep a copy of your living will in your medical record. You may want to give a copy to family members or friends. To alert caregivers in case of an  emergency, you can place a card in your wallet to let them know that you have a living will and where they can find it. A living will is used if you become:  Terminally ill.  Incapacitated.  Unable to communicate or make decisions. Items to consider in your living will include:  The use or non-use of life-sustaining equipment, such as dialysis machines and breathing machines (ventilators).  A DNR or DNAR order, which is the instruction not to use cardiopulmonary resuscitation (CPR) if breathing or heartbeat stops.  The use or non-use of tube feeding.  Withholding of food and fluids.  Comfort (palliative) care when the goal becomes comfort rather than a cure.  Organ and tissue donation. A living will does not give instructions for distributing your money and property if you should pass away. It is recommended that you seek the advice of a lawyer when writing a will. Decisions about taxes, beneficiaries, and asset distribution will be legally binding. This process can relieve your family and friends of any concerns surrounding disputes or questions that may come up about the distribution of your assets. DNR or DNAR A DNR or DNAR order is a request not to have CPR in the event that your heart stops beating or you stop breathing. If a DNR or DNAR order has not been made and shared, a health care provider will try to help any patient whose heart has stopped or who has stopped breathing. If you plan to have surgery, talk with your health care provider about how your DNR or DNAR order will be followed if problems occur. Summary  Advance directives are the legal documents that allow you to make choices ahead of time about your health care and medical treatment in case you become unable to communicate for yourself.  The process of discussing and writing advance directives should happen over time. You can change the advance directives, even after you have signed them.  Advance directives include  DNR or DNAR orders, living wills, and designating an agent as your medical power of attorney. This information is not intended to replace advice given to you by your health care provider. Make sure you discuss any questions you have with your health care provider. Document Released: 10/05/2007 Document Revised: 08/02/2018 Document Reviewed: 05/17/2016 Elsevier Patient Education  2020 [employer].

## 2019-07-09 NOTE — Patient Instructions (Signed)

## 2019-07-09 NOTE — Progress Notes (Signed)
[facility] OFFICE PROGRESS NOTE   Diagnosis: Rectal cancer  INTERVAL HISTORY:   Ms. Keats returns as scheduled.  She completed a cycle of FOLFIRI plus Avastin 06/13/2019.  She had some nausea.  No vomiting.  She had a few mouth sores.  She was able to eat and drink without difficulty.  She continues to have issues with constipation.  She had a "low-grade fever" for a few days.  She wonders if this was related to the flu shot she received.  She has various areas of "sharp pain" that resolved spontaneously.  She noted some blood vessels on her palms would "pop".  She had a headache for 5 days after treatment.  She notes periodic swelling in her legs.  Objective:  Vital signs in last 24 hours:  Blood pressure 129/85, pulse 89, temperature 98.9 F (37.2 C), temperature source Temporal, resp. rate 18, SpO2 99 %.    HEENT: No thrush or ulcers. GI: Abdomen with mild generalized tenderness.  No hepatomegaly.  Left lower quadrant colostomy. Vascular: No leg edema. Neuro: Alert and oriented. Skin: Palms without erythema.  No ecchymoses. Port-A-Cath without erythema.   Lab Results:  Lab Results  Component Value Date   WBC 6.1 07/09/2019   HGB 10.2 (L) 07/09/2019   HCT 32.7 (L) 07/09/2019   MCV 65.4 (L) 07/09/2019   PLT 302 07/09/2019   NEUTROABS 4.4 07/09/2019    Imaging:  No results found.  Medications: I have reviewed the patient's current medications.  Assessment/Plan: 1. Rectal cancer-rectal mass noted on digital exam 02/10/2018, colonoscopy confirmed a him my circumferential mass in the rectum ? 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 CTs 02/10/2018-anterior rectal wall thickening, pulmonary nodules measuring up to 9 mm concerning  for metastases, few round perirectal lymph nodes ? Pelvic MRI 02/20/2018-hypermetabolic low rectal mass extending to the posterior vagina with 2 small enlarged perirectal lymph nodes, T4b,N1-2.3 cm from the anal verge ? PET scan 02/23/2018-hypermetabolic rectal mass, 8 mm hypermetabolic lingular nodule, scattered small bilateral lung nodules, some calcified, a few with mildly increased activity ? Cycle 1 FOLFOXIRI8/21/2019 ? Cycle 5 FOLFOXIRI10/16/2019 ? CTs 05/01/2018-significant interval response to therapy with decreased size of the primary rectal mass lesion. Decreasing perirectal lymphadenopathy. Decreased and/or resolved pulmonary nodules. No new sites of disease identified. ? Cycle 6 FOLFOXIRI10/31/2019 ? Radiation/Xeloda 06/12/2018-completed 07/21/2018 ? Xeloda dose reduced 07/10/2018 due to mucositis and diarrhea ? CTs 08/07/2018-compared to 02/10/2018-resolved and decreased pulmonary nodules, few tiny residual noncalcified nodules, decreased 20 perirectal lymph nodes, soft tissue of the rectum indistinguishable from posterior wall of vagina-Vaginal involvement by tumor? ? 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 CT chest 09/29/2018-enlargement of lung nodules, not a candidate for SBRT based on discussion in GI tumor conference and with radiation oncology ? CT chest 11/27/18 - interval growth of numerous pulmonary metastases bilaterally compared to 09/29/18, no new pulmonary mets ? Cycle 1FOLFOXIRI 11/29/18 ? Cycle 2 FOLFOXIRI6/10/2018 ? Cycle 3 FOLFOXIRI 12/28/2018 ? Cycle 4 FOLFIRINOX 01/11/2019 ? CTs 01/24/2019-significant improvement of bilateral pulmonary metastases. ? Cycle 1 FOLFIRI 02/01/2019 ? Cycle 2 FOLFIRI 02/22/2019 ? Cycle 3 FOLFIRI 03/15/2019 ? Cycle 4  FOLFIRI 04/05/2019 ? Cycle 5 FOLFIRI 04/26/2019 ?  CTs 05/14/2019-slight enlargement of right lung nodules, no other evidence of disease progression ? Cycle 6 FOLFIRI 05/17/2019 ? Cycle 7 FOLFIRI plus Avastin 06/13/2019 ? 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 Imodium as needed. 6.Mucositis secondary to Xeloda. Improved 07/19/2018. 7. History of migraines   Disposition: Ms. Adamek appears stable.  She has completed 7 cycles of FOLFIRI.  Avastin was added with cycle 7.  Plan to proceed with FOLFIRI/Avastin today as scheduled.  We reviewed the CBC from today.  Counts adequate to proceed with treatment.  She will return for lab, follow-up, FOLFIRI/Avastin on 07/26/2019.  She will contact the office in the interim with any problems.  Plan reviewed with Dr. Benay Spice.    Ned Card ANP/GNP-BC   07/09/2019  9:57 AM

## 2019-07-11 ENCOUNTER — Telehealth: Payer: Self-pay | Admitting: Oncology

## 2019-07-11 ENCOUNTER — Other Ambulatory Visit: Payer: Self-pay

## 2019-07-11 ENCOUNTER — Inpatient Hospital Stay: Payer: BC Managed Care – PPO

## 2019-07-11 VITALS — BP 127/85 | HR 104 | Temp 98.1°F | Resp 18

## 2019-07-11 DIAGNOSIS — C20 Malignant neoplasm of rectum: Secondary | ICD-10-CM | POA: Diagnosis not present

## 2019-07-11 MED ORDER — SODIUM CHLORIDE 0.9% FLUSH
10.0000 mL | INTRAVENOUS | Status: DC | PRN
  Administered 2019-07-11: 10 mL
  Filled 2019-07-11: qty 10

## 2019-07-11 MED ORDER — HEPARIN SOD (PORK) LOCK FLUSH 100 UNIT/ML IV SOLN
500.0000 [IU] | Freq: Once | INTRAVENOUS | Status: AC | PRN
  Administered 2019-07-11: 500 [IU]
  Filled 2019-07-11: qty 5

## 2019-07-11 NOTE — Telephone Encounter (Signed)
Scheduled per los. Called and left msg. Mailed printout  °

## 2019-07-19 ENCOUNTER — Other Ambulatory Visit: Payer: Self-pay | Admitting: *Deleted

## 2019-07-19 DIAGNOSIS — C20 Malignant neoplasm of rectum: Secondary | ICD-10-CM

## 2019-07-19 MED ORDER — DEXAMETHASONE 4 MG PO TABS: ORAL_TABLET | ORAL | 2 refills | Status: DC

## 2019-07-19 NOTE — Telephone Encounter (Signed)
Requested refill on dexamethasone and alprazolam. Sent in steroid and confirmed w/[facility] that they still had the refill from 12/28 on file for her alprazolam and will fill it today. Patient notified.

## 2019-07-22 ENCOUNTER — Other Ambulatory Visit: Payer: Self-pay | Admitting: Oncology

## 2019-07-25 ENCOUNTER — Inpatient Hospital Stay: Payer: BC Managed Care – PPO | Attending: Oncology | Admitting: Oncology

## 2019-07-25 ENCOUNTER — Inpatient Hospital Stay: Payer: BC Managed Care – PPO

## 2019-07-25 ENCOUNTER — Other Ambulatory Visit: Payer: Self-pay

## 2019-07-25 VITALS — BP 104/89 | HR 108 | Temp 98.0°F | Resp 16 | Ht 67.0 in | Wt 130.4 lb

## 2019-07-25 DIAGNOSIS — C20 Malignant neoplasm of rectum: Secondary | ICD-10-CM | POA: Diagnosis present

## 2019-07-25 DIAGNOSIS — C7802 Secondary malignant neoplasm of left lung: Secondary | ICD-10-CM | POA: Insufficient documentation

## 2019-07-25 DIAGNOSIS — Z95828 Presence of other vascular implants and grafts: Secondary | ICD-10-CM

## 2019-07-25 DIAGNOSIS — C7801 Secondary malignant neoplasm of right lung: Secondary | ICD-10-CM | POA: Insufficient documentation

## 2019-07-25 DIAGNOSIS — Z5111 Encounter for antineoplastic chemotherapy: Secondary | ICD-10-CM | POA: Diagnosis present

## 2019-07-25 DIAGNOSIS — Z5112 Encounter for antineoplastic immunotherapy: Secondary | ICD-10-CM | POA: Diagnosis not present

## 2019-07-25 LAB — CBC WITH DIFFERENTIAL (CANCER CENTER ONLY)
Abs Immature Granulocytes: 0.04 10*3/uL (ref 0.00–0.07)
Basophils Absolute: 0 10*3/uL (ref 0.0–0.1)
Basophils Relative: 0 %
Eosinophils Absolute: 0.1 10*3/uL (ref 0.0–0.5)
Eosinophils Relative: 1 %
HCT: 32.5 % — ABNORMAL LOW (ref 36.0–46.0)
Hemoglobin: 10 g/dL — ABNORMAL LOW (ref 12.0–15.0)
Immature Granulocytes: 1 %
Lymphocytes Relative: 12 %
Lymphs Abs: 0.7 10*3/uL (ref 0.7–4.0)
MCH: 19.9 pg — ABNORMAL LOW (ref 26.0–34.0)
MCHC: 30.8 g/dL (ref 30.0–36.0)
MCV: 64.7 fL — ABNORMAL LOW (ref 80.0–100.0)
Monocytes Absolute: 0.5 10*3/uL (ref 0.1–1.0)
Monocytes Relative: 8 %
Neutro Abs: 4.3 10*3/uL (ref 1.7–7.7)
Neutrophils Relative %: 78 %
Platelet Count: 329 10*3/uL (ref 150–400)
RBC: 5.02 MIL/uL (ref 3.87–5.11)
RDW: 16.3 % — ABNORMAL HIGH (ref 11.5–15.5)
WBC Count: 5.6 10*3/uL (ref 4.0–10.5)
nRBC: 0 % (ref 0.0–0.2)

## 2019-07-25 LAB — CMP (CANCER CENTER ONLY)
ALT: 8 U/L (ref 0–44)
AST: 14 U/L — ABNORMAL LOW (ref 15–41)
Albumin: 3.7 g/dL (ref 3.5–5.0)
Alkaline Phosphatase: 143 U/L — ABNORMAL HIGH (ref 38–126)
Anion gap: 11 (ref 5–15)
BUN: 9 mg/dL (ref 6–20)
CO2: 22 mmol/L (ref 22–32)
Calcium: 8.6 mg/dL — ABNORMAL LOW (ref 8.9–10.3)
Chloride: 102 mmol/L (ref 98–111)
Creatinine: 0.61 mg/dL (ref 0.44–1.00)
GFR, Est AFR Am: >60 mL/min (ref >60)
GFR, Estimated: >60 mL/min (ref >60)
Glucose, Bld: 99 mg/dL (ref 70–99)
Potassium: 4.4 mmol/L (ref 3.5–5.1)
Sodium: 135 mmol/L (ref 135–145)
Total Bilirubin: 0.4 mg/dL (ref 0.3–1.2)
Total Protein: 6.5 g/dL (ref 6.5–8.1)

## 2019-07-25 LAB — MAGNESIUM: Magnesium: 2.1 mg/dL (ref 1.7–2.4)

## 2019-07-25 LAB — TOTAL PROTEIN, URINE DIPSTICK: Protein, ur: NEGATIVE mg/dL

## 2019-07-25 MED ORDER — SODIUM CHLORIDE 0.9 % IV SOLN
3200.0000 mg/m2 | INTRAVENOUS | Status: DC
  Administered 2019-07-25: 5100 mg via INTRAVENOUS
  Filled 2019-07-25: qty 102

## 2019-07-25 MED ORDER — SODIUM CHLORIDE 0.9 % IV SOLN
5.0000 mg/kg | Freq: Once | INTRAVENOUS | Status: AC
  Administered 2019-07-25: 300 mg via INTRAVENOUS
  Filled 2019-07-25: qty 12

## 2019-07-25 MED ORDER — IRINOTECAN HCL CHEMO INJECTION 100 MG/5ML
150.0000 mg/m2 | Freq: Once | INTRAVENOUS | Status: AC
  Administered 2019-07-25: 240 mg via INTRAVENOUS
  Filled 2019-07-25: qty 12

## 2019-07-25 MED ORDER — SODIUM CHLORIDE 0.9% FLUSH
10.0000 mL | INTRAVENOUS | Status: DC | PRN
  Filled 2019-07-25: qty 10

## 2019-07-25 MED ORDER — SODIUM CHLORIDE 0.9 % IV SOLN
Freq: Once | INTRAVENOUS | Status: AC
  Filled 2019-07-25: qty 5

## 2019-07-25 MED ORDER — MAGIC MOUTHWASH: 5.0000 mL | Freq: Four times a day (QID) | ORAL | 1 refills | Status: DC | PRN

## 2019-07-25 MED ORDER — LEUCOVORIN CALCIUM INJECTION 350 MG
200.0000 mg/m2 | Freq: Once | INTRAVENOUS | Status: AC
  Administered 2019-07-25: 318 mg via INTRAVENOUS
  Filled 2019-07-25: qty 15.9

## 2019-07-25 MED ORDER — PALONOSETRON HCL INJECTION 0.25 MG/5ML
0.2500 mg | Freq: Once | INTRAVENOUS | Status: AC
  Administered 2019-07-25: 0.25 mg via INTRAVENOUS

## 2019-07-25 MED ORDER — PALONOSETRON HCL INJECTION 0.25 MG/5ML
INTRAVENOUS | Status: AC
  Filled 2019-07-25: qty 5

## 2019-07-25 MED ORDER — SODIUM CHLORIDE 0.9% FLUSH
10.0000 mL | Freq: Once | INTRAVENOUS | Status: AC
  Administered 2019-07-25: 10 mL
  Filled 2019-07-25: qty 10

## 2019-07-25 MED ORDER — SODIUM CHLORIDE 0.9 % IV SOLN
Freq: Once | INTRAVENOUS | Status: AC
  Filled 2019-07-25: qty 250

## 2019-07-25 MED ORDER — HEPARIN SOD (PORK) LOCK FLUSH 100 UNIT/ML IV SOLN
500.0000 [IU] | Freq: Once | INTRAVENOUS | Status: DC | PRN
  Filled 2019-07-25: qty 5

## 2019-07-25 NOTE — Patient Instructions (Signed)
[place] Discharge Instructions for Patients Receiving Chemotherapy  Today you received the following chemotherapy agents: Avastin/Irinotecan/Leucovorin/Adrucil.  To help prevent nausea and vomiting after your treatment, we encourage you to take your nausea medication as directed.   If you develop nausea and vomiting that is not controlled by your nausea medication, call the clinic.   BELOW ARE SYMPTOMS THAT SHOULD BE REPORTED IMMEDIATELY:  *FEVER GREATER THAN 100.5 F  *CHILLS WITH OR WITHOUT FEVER  NAUSEA AND VOMITING THAT IS NOT CONTROLLED WITH YOUR NAUSEA MEDICATION  *UNUSUAL SHORTNESS OF BREATH  *UNUSUAL BRUISING OR BLEEDING  TENDERNESS IN MOUTH AND THROAT WITH OR WITHOUT PRESENCE OF ULCERS  *URINARY PROBLEMS  *BOWEL PROBLEMS  UNUSUAL RASH Items with * indicate a potential emergency and should be followed up as soon as possible.  Feel free to call the clinic should you have any questions or concerns. The clinic phone number is [phone].  Please show the [place] at check-in to the Emergency Department and triage nurse.  Advance Directive  Advance directives are legal documents that let you make choices ahead of time about your health care and medical treatment in case you become unable to communicate for yourself. Advance directives are a way for you to communicate your wishes to family, friends, and health care providers. This can help convey your decisions about end-of-life care if you become unable to communicate. Discussing and writing advance directives should happen over time rather than all at once. Advance directives can be changed depending on your situation and what you want, even after you have signed the advance directives. If you do not have an advance directive, some states assign family decision makers to act on your behalf based on how closely you are related to them. Each state has its own laws regarding advance directives. You  may want to check with your health care provider, attorney, or state representative about the laws in your state. There are different types of advance directives, such as:  Medical power of attorney.  Living will.  Do not resuscitate (DNR) or do not attempt resuscitation (DNAR) order. Health care proxy and medical power of attorney A health care proxy, also called a health care agent, is a person who is appointed to make medical decisions for you in cases in which you are unable to make the decisions yourself. Generally, people choose someone they know well and trust to represent their preferences. Make sure to ask this person for an agreement to act as your proxy. A proxy may have to exercise judgment in the event of a medical decision for which your wishes are not known. A medical power of attorney is a legal document that names your health care proxy. Depending on the laws in your state, after the document is written, it may also need to be:  Signed.  Notarized.  Dated.  Copied.  Witnessed.  Incorporated into your medical record. You may also want to appoint someone to manage your financial affairs in a situation in which you are unable to do so. This is called a durable power of attorney for finances. It is a separate legal document from the durable power of attorney for health care. You may choose the same person or someone different from your health care proxy to act as your agent in financial matters. If you do not appoint a proxy, or if there is a concern that the proxy is not acting in your best interests, a court-appointed guardian may  be designated to act on your behalf. Living will A living will is a set of instructions documenting your wishes about medical care when you cannot express them yourself. Health care providers should keep a copy of your living will in your medical record. You may want to give a copy to family members or friends. To alert caregivers in case of an  emergency, you can place a card in your wallet to let them know that you have a living will and where they can find it. A living will is used if you become:  Terminally ill.  Incapacitated.  Unable to communicate or make decisions. Items to consider in your living will include:  The use or non-use of life-sustaining equipment, such as dialysis machines and breathing machines (ventilators).  A DNR or DNAR order, which is the instruction not to use cardiopulmonary resuscitation (CPR) if breathing or heartbeat stops.  The use or non-use of tube feeding.  Withholding of food and fluids.  Comfort (palliative) care when the goal becomes comfort rather than a cure.  Organ and tissue donation. A living will does not give instructions for distributing your money and property if you should pass away. It is recommended that you seek the advice of a lawyer when writing a will. Decisions about taxes, beneficiaries, and asset distribution will be legally binding. This process can relieve your family and friends of any concerns surrounding disputes or questions that may come up about the distribution of your assets. DNR or DNAR A DNR or DNAR order is a request not to have CPR in the event that your heart stops beating or you stop breathing. If a DNR or DNAR order has not been made and shared, a health care provider will try to help any patient whose heart has stopped or who has stopped breathing. If you plan to have surgery, talk with your health care provider about how your DNR or DNAR order will be followed if problems occur. Summary  Advance directives are the legal documents that allow you to make choices ahead of time about your health care and medical treatment in case you become unable to communicate for yourself.  The process of discussing and writing advance directives should happen over time. You can change the advance directives, even after you have signed them.  Advance directives include  DNR or DNAR orders, living wills, and designating an agent as your medical power of attorney. This information is not intended to replace advice given to you by your health care provider. Make sure you discuss any questions you have with your health care provider. Document Released: 10/05/2007 Document Revised: 08/02/2018 Document Reviewed: 05/17/2016 Elsevier Patient Education  2020 [employer].

## 2019-07-25 NOTE — Progress Notes (Signed)
[facility] OFFICE PROGRESS NOTE   Diagnosis: Rectal cancer  INTERVAL HISTORY:    Tina Morales completed another cycle of FOLFIRI/Avastin on 07/09/2019.  She reports diarrhea lasting for 5 days following chemotherapy.  She did not take antidiarrhea medication.  She is concerned about a bowel obstruction.  She has noted increased headaches since beginning treatment with Avastin.  She takes Tylenol for the headaches.  No bleeding or stroke symptoms.  She has intermittent upper airway congestion feeling like she needs to clear her throat.  No dyspnea or cough.  She had discomfort in the left pretibial area yesterday.  She noted mild swelling of the left compared to the right lower leg.  This has resolved.  She eats frequent small meals.  She had mouth sores following chemotherapy.  She developed sores at the distal right first and second digits following chemotherapy.  Tina Morales also reports discomfort at the labia for a few days following chemotherapy.  This has resolved.  Tina Morales notes cramping during the irinotecan infusion.  Objective:  Vital signs in last 24 hours:  Blood pressure 104/89, pulse (!) 108, temperature 98 F (36.7 C), temperature source Temporal, resp. rate 16, height 5' 7"  (1.702 m), weight 130 lb 6.4 oz (59.1 kg), SpO2 100 %.    HEENT: No thrush or ulcers, multiple broken teeth, periodontal disease Resp: Lungs clear bilaterally, no respiratory distress Cardio: Regular rate and rhythm GI: No hepatosplenomegaly, left lower quadrant colostomy Vascular: No leg edema, erythema, or tenderness    Portacath/PICC-without erythema  Lab Results:  Lab Results  Component Value Date   WBC 5.6 07/25/2019   HGB 10.0 (L) 07/25/2019   HCT 32.5 (L) 07/25/2019   MCV 64.7 (L) 07/25/2019   PLT 329 07/25/2019   NEUTROABS 4.3 07/25/2019    CMP  Lab Results  Component Value Date   NA 134 (L) 07/09/2019   K 4.7 07/09/2019   CL 101 07/09/2019   CO2 23  07/09/2019   GLUCOSE 92 07/09/2019   BUN 9 07/09/2019   CREATININE 0.62 07/09/2019   CALCIUM 8.8 (L) 07/09/2019   PROT 6.5 07/09/2019   ALBUMIN 3.9 07/09/2019   AST 18 07/09/2019   ALT 10 07/09/2019   ALKPHOS 137 (H) 07/09/2019   BILITOT 0.3 07/09/2019   GFRNONAA >60 07/09/2019   GFRAA >60 07/09/2019    Lab Results  Component Value Date   CEA1 1.95 06/13/2019     Medications: I have reviewed the patient's current medications.   Assessment/Plan: 1. Rectal cancer-rectal mass noted on digital exam 02/10/2018, colonoscopy confirmed a him my circumferential mass in the rectum ? 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 CTs 02/10/2018-anterior rectal wall thickening, pulmonary nodules measuring up to 9 mm concerning for metastases, few round perirectal lymph nodes ? Pelvic MRI 02/20/2018-hypermetabolic low rectal mass extending to the posterior vagina with 2 small enlarged perirectal lymph nodes, T4b,N1-2.3 cm from the anal verge ? PET scan 02/23/2018-hypermetabolic rectal mass, 8 mm hypermetabolic lingular nodule, scattered small bilateral lung nodules, some calcified, a few with mildly increased activity ? Cycle 1 FOLFOXIRI8/21/2019 ? Cycle 5 FOLFOXIRI10/16/2019 ? CTs 05/01/2018-significant interval response to therapy with decreased size of the primary rectal mass lesion. Decreasing perirectal lymphadenopathy. Decreased and/or resolved pulmonary nodules. No new sites of disease identified. ? Cycle 6 FOLFOXIRI10/31/2019 ? Radiation/Xeloda  06/12/2018-completed 07/21/2018 ? Xeloda dose reduced 07/10/2018 due to mucositis and diarrhea ? CTs 08/07/2018-compared to 02/10/2018-resolved and decreased pulmonary nodules, few tiny residual noncalcified nodules,  decreased 20 perirectal lymph nodes, soft tissue of the rectum indistinguishable from posterior wall of vagina-Vaginal involvement by tumor? ? 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 CT chest 09/29/2018-enlargement of lung nodules, not a candidate for SBRT based on discussion in GI tumor conference and with radiation oncology ? CT chest 11/27/18 - interval growth of numerous pulmonary metastases bilaterally compared to 09/29/18, no new pulmonary mets ? Cycle 1FOLFOXIRI 11/29/18 ? Cycle 2 FOLFOXIRI6/10/2018 ? Cycle 3 FOLFOXIRI 12/28/2018 ? Cycle 4 FOLFIRINOX 01/11/2019 ? CTs 01/24/2019-significant improvement of bilateral pulmonary metastases. ? Cycle 1 FOLFIRI 02/01/2019 ? Cycle 2 FOLFIRI 02/22/2019 ? Cycle 3 FOLFIRI 03/15/2019 ? Cycle 4 FOLFIRI 04/05/2019 ? Cycle 5 FOLFIRI 04/26/2019 ? CTs 05/14/2019-slight enlargement of right lung nodules, no other evidence of disease progression ? Cycle 6 FOLFIRI 05/17/2019 ? Cycle 7 FOLFIRI plus Avastin 06/13/2019 ? Cycle 8 FOLFIRI plus Avastin 07/09/2019 ? 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 Imodium as needed. 6.Mucositis secondary to Xeloda. Improved 07/19/2018. 7. History of migraines     Disposition: Tina Morales appears unchanged.  She has multiple complaints today, some of her symptoms are related to chemotherapy.  She will use Magic mouthwash as needed for mouth sores.  She will try barrier cream if there is labial irritation following chemotherapy.  She will call for persistent swelling or pain in the  left leg.  I have a low clinical suspicion for a deep vein thrombosis today.  The plan is to continue FOLFIRI/Avastin.  Avastin will be placed on hold if she develops a dental issue requiring an extraction.  She will return for an office visit and chemotherapy in 2 weeks.  The plan is to schedule a restaging CT evaluation after cycle 11.  Betsy Coder, MD  07/25/2019  12:16 PM

## 2019-07-25 NOTE — Progress Notes (Signed)
Per Dr. Benay Spice, okay for patient to receive treatment with pulse 108.

## 2019-07-26 ENCOUNTER — Other Ambulatory Visit: Payer: Self-pay | Admitting: *Deleted

## 2019-07-26 ENCOUNTER — Telehealth: Payer: Self-pay | Admitting: Oncology

## 2019-07-26 MED ORDER — MAGIC MOUTHWASH: 5.0000 mL | Freq: Four times a day (QID) | ORAL | 1 refills | Status: DC | PRN

## 2019-07-26 NOTE — Progress Notes (Signed)
Patient is requesting to have the same MMW ingredients and doses last dispensed. Spoke with pharmacist, Joe at [facility] and the reports she last received 245ml = 30 ml of nystatin 210 ml of Benadryl  60 mg hydrocortisone (#3 20 mg tabs crushed) Dr. Benay Spice reports this is not what was intended to be called in at last script, but he sees no harm in this formula and agreed. Patient notified.

## 2019-07-26 NOTE — Telephone Encounter (Signed)
Returned patient's phone call regarding rescheduling 01/15 appointment time, per patient's request appointment time has been rescheduled.

## 2019-07-26 NOTE — Progress Notes (Signed)
Pharmacy called to request on behalf of patient to increase quantity of MMW to 960 ml. Also she is requesting a different concentration of the ingredients. Instructed pharmacist that Dr. Benay Spice only orders the 1:1:1 ratio without the lidocaine.

## 2019-07-27 ENCOUNTER — Inpatient Hospital Stay: Payer: BC Managed Care – PPO

## 2019-07-27 ENCOUNTER — Other Ambulatory Visit: Payer: Self-pay

## 2019-07-27 VITALS — BP 108/78 | HR 88 | Temp 98.2°F | Resp 18

## 2019-07-27 DIAGNOSIS — C20 Malignant neoplasm of rectum: Secondary | ICD-10-CM | POA: Diagnosis not present

## 2019-07-27 MED ORDER — HEPARIN SOD (PORK) LOCK FLUSH 100 UNIT/ML IV SOLN
500.0000 [IU] | Freq: Once | INTRAVENOUS | Status: AC | PRN
  Administered 2019-07-27: 500 [IU]
  Filled 2019-07-27: qty 5

## 2019-07-27 MED ORDER — SODIUM CHLORIDE 0.9% FLUSH
10.0000 mL | INTRAVENOUS | Status: DC | PRN
  Administered 2019-07-27: 10 mL
  Filled 2019-07-27: qty 10

## 2019-08-05 ENCOUNTER — Other Ambulatory Visit: Payer: Self-pay | Admitting: Oncology

## 2019-08-09 ENCOUNTER — Other Ambulatory Visit: Payer: Self-pay

## 2019-08-09 ENCOUNTER — Inpatient Hospital Stay: Payer: BC Managed Care – PPO

## 2019-08-09 ENCOUNTER — Encounter: Payer: Self-pay | Admitting: Nurse Practitioner

## 2019-08-09 ENCOUNTER — Inpatient Hospital Stay (HOSPITAL_BASED_OUTPATIENT_CLINIC_OR_DEPARTMENT_OTHER): Payer: BC Managed Care – PPO | Admitting: Nurse Practitioner

## 2019-08-09 VITALS — BP 131/76 | HR 109 | Temp 98.9°F | Resp 17 | Ht 67.0 in | Wt 130.6 lb

## 2019-08-09 DIAGNOSIS — C20 Malignant neoplasm of rectum: Secondary | ICD-10-CM

## 2019-08-09 DIAGNOSIS — Z95828 Presence of other vascular implants and grafts: Secondary | ICD-10-CM

## 2019-08-09 LAB — CBC WITH DIFFERENTIAL (CANCER CENTER ONLY)
Abs Immature Granulocytes: 0.04 10*3/uL (ref 0.00–0.07)
Basophils Absolute: 0 10*3/uL (ref 0.0–0.1)
Basophils Relative: 0 %
Eosinophils Absolute: 0.1 10*3/uL (ref 0.0–0.5)
Eosinophils Relative: 2 %
HCT: 33.3 % — ABNORMAL LOW (ref 36.0–46.0)
Hemoglobin: 10.4 g/dL — ABNORMAL LOW (ref 12.0–15.0)
Immature Granulocytes: 1 %
Lymphocytes Relative: 12 %
Lymphs Abs: 0.9 10*3/uL (ref 0.7–4.0)
MCH: 20.3 pg — ABNORMAL LOW (ref 26.0–34.0)
MCHC: 31.2 g/dL (ref 30.0–36.0)
MCV: 65 fL — ABNORMAL LOW (ref 80.0–100.0)
Monocytes Absolute: 0.7 10*3/uL (ref 0.1–1.0)
Monocytes Relative: 9 %
Neutro Abs: 6.1 10*3/uL (ref 1.7–7.7)
Neutrophils Relative %: 76 %
Platelet Count: 293 10*3/uL (ref 150–400)
RBC: 5.12 MIL/uL — ABNORMAL HIGH (ref 3.87–5.11)
RDW: 16.9 % — ABNORMAL HIGH (ref 11.5–15.5)
WBC Count: 7.9 10*3/uL (ref 4.0–10.5)
nRBC: 0 % (ref 0.0–0.2)

## 2019-08-09 LAB — CMP (CANCER CENTER ONLY)
ALT: 12 U/L (ref 0–44)
AST: 16 U/L (ref 15–41)
Albumin: 3.6 g/dL (ref 3.5–5.0)
Alkaline Phosphatase: 140 U/L — ABNORMAL HIGH (ref 38–126)
Anion gap: 10 (ref 5–15)
BUN: 8 mg/dL (ref 6–20)
CO2: 25 mmol/L (ref 22–32)
Calcium: 8.6 mg/dL — ABNORMAL LOW (ref 8.9–10.3)
Chloride: 103 mmol/L (ref 98–111)
Creatinine: 0.63 mg/dL (ref 0.44–1.00)
GFR, Est AFR Am: >60 mL/min (ref >60)
GFR, Estimated: >60 mL/min (ref >60)
Glucose, Bld: 98 mg/dL (ref 70–99)
Potassium: 4.5 mmol/L (ref 3.5–5.1)
Sodium: 138 mmol/L (ref 135–145)
Total Bilirubin: 0.4 mg/dL (ref 0.3–1.2)
Total Protein: 6.5 g/dL (ref 6.5–8.1)

## 2019-08-09 LAB — MAGNESIUM: Magnesium: 2.1 mg/dL (ref 1.7–2.4)

## 2019-08-09 MED ORDER — IRINOTECAN HCL CHEMO INJECTION 100 MG/5ML
150.0000 mg/m2 | Freq: Once | INTRAVENOUS | Status: AC
  Administered 2019-08-09: 240 mg via INTRAVENOUS
  Filled 2019-08-09: qty 12

## 2019-08-09 MED ORDER — PALONOSETRON HCL INJECTION 0.25 MG/5ML
INTRAVENOUS | Status: AC
  Filled 2019-08-09: qty 5

## 2019-08-09 MED ORDER — SODIUM CHLORIDE 0.9 % IV SOLN
5.0000 mg/kg | Freq: Once | INTRAVENOUS | Status: AC
  Administered 2019-08-09: 300 mg via INTRAVENOUS
  Filled 2019-08-09: qty 12

## 2019-08-09 MED ORDER — SODIUM CHLORIDE 0.9 % IV SOLN
Freq: Once | INTRAVENOUS | Status: AC
  Filled 2019-08-09: qty 5

## 2019-08-09 MED ORDER — PALONOSETRON HCL INJECTION 0.25 MG/5ML
0.2500 mg | Freq: Once | INTRAVENOUS | Status: AC
  Administered 2019-08-09: 0.25 mg via INTRAVENOUS

## 2019-08-09 MED ORDER — SODIUM CHLORIDE 0.9% FLUSH
10.0000 mL | Freq: Once | INTRAVENOUS | Status: AC
  Administered 2019-08-09: 10 mL
  Filled 2019-08-09: qty 10

## 2019-08-09 MED ORDER — SODIUM CHLORIDE 0.9 % IV SOLN
3200.0000 mg/m2 | INTRAVENOUS | Status: DC
  Administered 2019-08-09: 5100 mg via INTRAVENOUS
  Filled 2019-08-09: qty 102

## 2019-08-09 MED ORDER — LEUCOVORIN CALCIUM INJECTION 350 MG
200.0000 mg/m2 | Freq: Once | INTRAVENOUS | Status: AC
  Administered 2019-08-09: 318 mg via INTRAVENOUS
  Filled 2019-08-09 (×2): qty 15.9

## 2019-08-09 MED ORDER — SODIUM CHLORIDE 0.9 % IV SOLN
Freq: Once | INTRAVENOUS | Status: AC
  Filled 2019-08-09: qty 250

## 2019-08-09 NOTE — Progress Notes (Signed)
[place] OFFICE PROGRESS NOTE   Diagnosis: Rectal cancer  INTERVAL HISTORY:   Tina Morales returns as scheduled.  She completed cycle 9 FOLFIRI plus Avastin on 07/25/2019.  Some nausea.  No significant diarrhea.  She continues to note tingling in the feet.  She again had headaches after treatment.  She continues to have intermittent pain at the left lower leg.  No swelling.  She denies bleeding.  No shortness of breath.  Objective:  Vital signs in last 24 hours:  Blood pressure 131/76, pulse (!) 109, temperature 98.9 F (37.2 C), temperature source Temporal, resp. rate 17, height 5' 7"  (1.702 m), weight 130 lb 9.6 oz (59.2 kg), SpO2 100 %.    HEENT: No thrush or ulcers. GI: Abdomen soft and nontender.  No hepatomegaly. Vascular: No leg edema.  Calves soft and nontender. Neuro: Alert and oriented. Skin: Palms without erythema. Port-A-Cath without erythema.   Lab Results:  Lab Results  Component Value Date   WBC 7.9 08/09/2019   HGB 10.4 (L) 08/09/2019   HCT 33.3 (L) 08/09/2019   MCV 65.0 (L) 08/09/2019   PLT 293 08/09/2019   NEUTROABS 6.1 08/09/2019    Imaging:  No results found.  Medications: I have reviewed the patient's current medications.  Assessment/Plan: 1. Rectal cancer-rectal mass noted on digital exam 02/10/2018, colonoscopy confirmed a him my circumferential mass in the rectum ? 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 CTs 02/10/2018-anterior rectal wall thickening, pulmonary nodules measuring up to 9 mm concerning for metastases, few round perirectal lymph nodes ? Pelvic MRI 02/20/2018-hypermetabolic low rectal mass extending to the posterior vagina with 2 small enlarged perirectal lymph nodes, T4b,N1-2.3 cm from  the anal verge ? PET scan 02/23/2018-hypermetabolic rectal mass, 8 mm hypermetabolic lingular nodule, scattered small bilateral lung nodules, some calcified, a few with mildly increased activity ? Cycle 1 FOLFOXIRI8/21/2019 ? Cycle 5 FOLFOXIRI10/16/2019 ? CTs 05/01/2018-significant interval response to therapy with decreased size of the primary rectal mass lesion. Decreasing perirectal lymphadenopathy. Decreased and/or resolved pulmonary nodules. No new sites of disease identified. ? Cycle 6 FOLFOXIRI10/31/2019 ? Radiation/Xeloda 06/12/2018-completed 07/21/2018 ? Xeloda dose reduced 07/10/2018 due to mucositis and diarrhea ? CTs 08/07/2018-compared to 02/10/2018-resolved and decreased pulmonary nodules, few tiny residual noncalcified nodules, decreased 20 perirectal lymph nodes, soft tissue of the rectum indistinguishable from posterior wall of vagina-Vaginal involvement by tumor? ? 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 CT chest 09/29/2018-enlargement of lung nodules, not a candidate for SBRT based on discussion in GI tumor conference and with radiation oncology ? CT chest 11/27/18 - interval growth of numerous pulmonary metastases bilaterally compared to 09/29/18, no new pulmonary mets ? Cycle 1FOLFOXIRI 11/29/18 ? Cycle 2 FOLFOXIRI6/10/2018 ? Cycle 3 FOLFOXIRI 12/28/2018 ? Cycle 4 FOLFIRINOX 01/11/2019 ? CTs 01/24/2019-significant improvement of bilateral pulmonary metastases. ? Cycle 1 FOLFIRI 02/01/2019 ? Cycle 2 FOLFIRI 02/22/2019 ? Cycle 3 FOLFIRI 03/15/2019 ? Cycle 4 FOLFIRI 04/05/2019 ? Cycle 5 FOLFIRI 04/26/2019 ? CTs 05/14/2019-slight enlargement of right lung nodules, no other evidence of disease progression ? Cycle 6 FOLFIRI 05/17/2019 ? Cycle 7 FOLFIRI plus  Avastin 06/13/2019 ? Cycle 8 FOLFIRI plus Avastin 07/09/2019 ? Cycle 9 FOLFIRI plus Avastin 07/25/2019 ?  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 Imodium as needed. 6.Mucositis secondary to Xeloda. Improved 07/19/2018. 7. History of migraines    Disposition: Tina Morales appears stable.  She has completed 9 cycles of FOLFIRI plus Avastin.  Plan to proceed with cycle 10 today as scheduled.  The plan is for restaging CTs after cycle 11.  We reviewed the CBC and chemistry panel from today.  Labs are adequate to proceed as above.  She will return for lab, follow-up, cycle 11 FOLFIRI/Avastin in 2 weeks.  She will contact the office in the interim with any problems.    Ned Card ANP/GNP-BC   08/09/2019  11:57 AM

## 2019-08-09 NOTE — Patient Instructions (Signed)
[place] Discharge Instructions for Patients Receiving Chemotherapy  Today you received the following chemotherapy agents: bevacizumab, irinotecan, and fluorouracil.   To help prevent nausea and vomiting after your treatment, we encourage you to take your nausea medication as directed.   If you develop nausea and vomiting that is not controlled by your nausea medication, call the clinic.   BELOW ARE SYMPTOMS THAT SHOULD BE REPORTED IMMEDIATELY:  *FEVER GREATER THAN 100.5 F  *CHILLS WITH OR WITHOUT FEVER  NAUSEA AND VOMITING THAT IS NOT CONTROLLED WITH YOUR NAUSEA MEDICATION  *UNUSUAL SHORTNESS OF BREATH  *UNUSUAL BRUISING OR BLEEDING  TENDERNESS IN MOUTH AND THROAT WITH OR WITHOUT PRESENCE OF ULCERS  *URINARY PROBLEMS  *BOWEL PROBLEMS  UNUSUAL RASH Items with * indicate a potential emergency and should be followed up as soon as possible.  Feel free to call the clinic should you have any questions or concerns. The clinic phone number is [phone].  Please show the [place] at check-in to the Emergency Department and triage nurse.

## 2019-08-09 NOTE — Progress Notes (Signed)
Per Ned Card NP, ok to treat with elevated HR.

## 2019-08-11 ENCOUNTER — Other Ambulatory Visit: Payer: Self-pay

## 2019-08-11 ENCOUNTER — Inpatient Hospital Stay: Payer: BC Managed Care – PPO

## 2019-08-11 VITALS — BP 120/89 | HR 90 | Resp 19

## 2019-08-11 DIAGNOSIS — C20 Malignant neoplasm of rectum: Secondary | ICD-10-CM

## 2019-08-11 MED ORDER — HEPARIN SOD (PORK) LOCK FLUSH 100 UNIT/ML IV SOLN
500.0000 [IU] | Freq: Once | INTRAVENOUS | Status: AC | PRN
  Administered 2019-08-11: 13:00:00 500 [IU]
  Filled 2019-08-11: qty 5

## 2019-08-11 MED ORDER — SODIUM CHLORIDE 0.9% FLUSH
10.0000 mL | INTRAVENOUS | Status: DC | PRN
  Administered 2019-08-11: 10 mL
  Filled 2019-08-11: qty 10

## 2019-08-11 NOTE — Patient Instructions (Signed)
Tunneled Central Venous Catheter Flushing Guide  It is important to flush your tunneled central venous catheter each time you use it, both before and after you use it. Flushing your catheter will help prevent it from clogging. What are the risks? Risks may include:  Infection.  Air getting into the catheter and bloodstream. Supplies needed:  A clean pair of gloves.  A disinfecting wipe. Use an alcohol wipe, chlorhexidine wipe, or iodine wipe as told by your health care provider.  A 10 mL syringe that has been prefilled with saline solution.  An empty 10 mL syringe, if a substance called heparin was injected into your catheter. How to flush your catheter When you flush your catheter, make sure you follow any specific instructions from your health care provider or the manufacturer. These are general guidelines. Flushing your catheter before use If there is heparin in your catheter: 1. Wash your hands with soap and water. 2. Put on gloves. 3. Scrub the injection cap for a minimum of 15 seconds with a disinfecting wipe. 4. Unclamp the catheter. 5. Attach the empty syringe to the injection cap. 6. Pull the syringe plunger back and withdraw 10 mL of blood. 7. Place the syringe into an appropriate waste container. 8. Scrub the injection cap for 15 seconds with a disinfecting wipe. 9. Attach the prefilled syringe to the injection cap. 10. Flush the catheter by pushing the plunger forward until all the liquid from the syringe is in the catheter. 11. Remove the syringe from the injection cap. 12. Clamp the catheter. If there is no heparin in your catheter: 1. Wash your hands with soap and water. 2. Put on gloves. 3. Scrub the injection cap for 15 seconds with a disinfecting wipe. 4. Unclamp the catheter. 5. Attach the prefilled syringe to the injection cap. 6. Flush the catheter by pushing the plunger forward until 5 mL of the liquid from the syringe is in the catheter. 7. Pull back on  the syringe until you see blood in the catheter. 8. If you have been asked to collect any blood, follow your health care provider's instructions. Otherwise, flush the catheter with the rest of the solution from the syringe. 9. Remove the syringe from the injection cap. 10. Clamp the catheter.  Flushing your catheter after use 1. Wash your hands with soap and water. 2. Put on gloves. 3. Scrub the injection cap for 15 seconds with a disinfecting wipe. 4. Unclamp the catheter. 5. Attach the prefilled syringe to the injection cap. 6. Flush the catheter by pushing the plunger forward until all of the liquid from the syringe is in the catheter. 7. Remove the syringe from the injection cap. 8. Clamp the catheter. Problems and solutions  If blood cannot be completely cleared from the injection cap, you may need to have the injection cap replaced.  If the catheter is difficult to flush, use the pulsing method. The pulsing method involves pushing only a few milliliters of solution into the catheter at a time and pausing between pushes.  If you do not see blood in the catheter when you pull back on the syringe, change your body position, such as by raising your arms above your head. Take a deep breath and cough. Then, pull back on the syringe. If you still do not see blood, flush the catheter with a small amount of solution. Then, change positions again and take a breath or cough. Pull back on the syringe again. If you still do not see   blood, finish flushing the catheter and contact your health care provider. Do not use your catheter until your health care provider says it is okay. General tips  Have someone help you flush your catheter, if possible.  Do not force fluid through your catheter.  Do not use a syringe that is larger or smaller than 10 mL. Using a smaller syringe can make the catheter burst.  Do not use your catheter without flushing it first if it has heparin in it. Contact a health  care provider if:  You cannot see any blood in the catheter when you flush it before using it.  Your catheter is difficult to flush. Get help right away if:  You cannot flush the catheter.  The catheter leaks when you flush it or when there is fluid in it.  There are cracks or breaks in the catheter. Summary  It is important to flush your tunneled central venous catheter each time you use it, both before and after you use it.  Scrub the injection cap for 15 seconds with a disinfecting wipe before and after you flush it.  When you flush your catheter, make sure you follow any specific instructions from your health care provider or the manufacturer.  Get help right away if you cannot flush the catheter. This information is not intended to replace advice given to you by your health care provider. Make sure you discuss any questions you have with your health care provider. Document Revised: 03/23/2019 Document Reviewed: 09/13/2018 Elsevier Patient Education  2020 [employer].  Coronavirus (COVID-19) Are you at risk?  Are you at risk for the Coronavirus (COVID-19)?  To be considered HIGH RISK for Coronavirus (COVID-19), you have to meet the following criteria:  . Traveled to [place], [place], [place], [place] or [place]; or in the [place] to [place], [place], [place], or [place]; and have fever, cough, and shortness of breath within the last 2 weeks of travel OR . Been in close contact with a person diagnosed with COVID-19 within the last 2 weeks and have fever, cough, and shortness of breath . IF YOU DO NOT MEET THESE CRITERIA, YOU ARE CONSIDERED LOW RISK FOR COVID-19.  What to do if you are HIGH RISK for COVID-19?  . If you are having a medical emergency, call 911. . Seek medical care right away. Before you go to a doctor's office, urgent care or emergency department, call ahead and tell them about your recent travel, contact with someone diagnosed with COVID-19, and  your symptoms. You should receive instructions from your physician's office regarding next steps of care.  . When you arrive at healthcare provider, tell the healthcare staff immediately you have returned from visiting [place], [place], [place], [place] or [place]; or traveled in the [place] to [place], [place], [place], or [place]; in the last two weeks or you have been in close contact with a person diagnosed with COVID-19 in the last 2 weeks.   . Tell the health care staff about your symptoms: fever, cough and shortness of breath. . After you have been seen by a medical provider, you will be either: o Tested for (COVID-19) and discharged home on quarantine except to seek medical care if symptoms worsen, and asked to  - Stay home and avoid contact with others until you get your results (4-5 days)  - Avoid travel on public transportation if possible (such as bus, train, or airplane) or o Sent to the Emergency   Department by EMS for evaluation, COVID-19 testing, and possible admission depending on your condition and test results.  What to do if you are LOW RISK for COVID-19?  Reduce your risk of any infection by using the same precautions used for avoiding the common cold or flu:  . Wash your hands often with soap and warm water for at least 20 seconds.  If soap and water are not readily available, use an alcohol-based hand sanitizer with at least 60% alcohol.  . If coughing or sneezing, cover your mouth and nose by coughing or sneezing into the elbow areas of your shirt or coat, into a tissue or into your sleeve (not your hands). . Avoid shaking hands with others and consider head nods or verbal greetings only. . Avoid touching your eyes, nose, or mouth with unwashed hands.  . Avoid close contact with people who are sick. . Avoid places or events with large numbers of people in one location, like concerts or sporting events. . Carefully consider travel plans you have or are  making. . If you are planning any travel outside or inside the [place], visit the CDC's Travelers' Health webpage for the latest health notices. . If you have some symptoms but not all symptoms, continue to monitor at home and seek medical attention if your symptoms worsen. . If you are having a medical emergency, call 911.   ADDITIONAL HEALTHCARE OPTIONS FOR PATIENTS  [facility] / e-Visit: [email or web address]         [facility]: [phone]  [facility]: [phone]                   [facility]: [phone]   

## 2019-08-17 ENCOUNTER — Telehealth: Payer: Self-pay | Admitting: *Deleted

## 2019-08-17 DIAGNOSIS — C20 Malignant neoplasm of rectum: Secondary | ICD-10-CM

## 2019-08-17 MED ORDER — PROCHLORPERAZINE MALEATE 10 MG PO TABS: 10.0000 mg | ORAL_TABLET | Freq: Four times a day (QID) | ORAL | 1 refills | Status: DC | PRN

## 2019-08-17 MED ORDER — ALPRAZOLAM 0.5 MG PO TABS: 0.5000 mg | ORAL_TABLET | Freq: Two times a day (BID) | ORAL | 0 refills | Status: DC | PRN

## 2019-08-17 MED ORDER — DIPHENOXYLATE-ATROPINE 2.5-0.025 MG PO TABS: 2.0000 | ORAL_TABLET | Freq: Four times a day (QID) | ORAL | 0 refills | Status: DC | PRN

## 2019-08-17 NOTE — Telephone Encounter (Signed)
Left VM requesting refills on xanax, compazine and lomotil. Also should have an appointment prior to the one on 09/06/19. Left VM that refills are being completed and scheduler will call with appointments that are due on 08/23/19. High priority scheduling message sent to schedule appointments that were ordered on 08/09/19.

## 2019-08-22 ENCOUNTER — Inpatient Hospital Stay (HOSPITAL_BASED_OUTPATIENT_CLINIC_OR_DEPARTMENT_OTHER): Payer: BC Managed Care – PPO | Admitting: Oncology

## 2019-08-22 ENCOUNTER — Inpatient Hospital Stay: Payer: BC Managed Care – PPO

## 2019-08-22 ENCOUNTER — Other Ambulatory Visit: Payer: Self-pay

## 2019-08-22 ENCOUNTER — Inpatient Hospital Stay: Payer: BC Managed Care – PPO | Attending: Oncology

## 2019-08-22 VITALS — BP 123/80 | HR 98 | Temp 98.2°F | Resp 17 | Ht 67.0 in | Wt 132.0 lb

## 2019-08-22 DIAGNOSIS — C20 Malignant neoplasm of rectum: Secondary | ICD-10-CM

## 2019-08-22 DIAGNOSIS — C7801 Secondary malignant neoplasm of right lung: Secondary | ICD-10-CM | POA: Insufficient documentation

## 2019-08-22 DIAGNOSIS — Z5111 Encounter for antineoplastic chemotherapy: Secondary | ICD-10-CM | POA: Insufficient documentation

## 2019-08-22 DIAGNOSIS — C7802 Secondary malignant neoplasm of left lung: Secondary | ICD-10-CM | POA: Insufficient documentation

## 2019-08-22 DIAGNOSIS — Z5112 Encounter for antineoplastic immunotherapy: Secondary | ICD-10-CM | POA: Insufficient documentation

## 2019-08-22 LAB — CBC WITH DIFFERENTIAL (CANCER CENTER ONLY)
Abs Immature Granulocytes: 0.03 10*3/uL (ref 0.00–0.07)
Basophils Absolute: 0 10*3/uL (ref 0.0–0.1)
Basophils Relative: 0 %
Eosinophils Absolute: 0.1 10*3/uL (ref 0.0–0.5)
Eosinophils Relative: 1 %
HCT: 30.7 % — ABNORMAL LOW (ref 36.0–46.0)
Hemoglobin: 9.5 g/dL — ABNORMAL LOW (ref 12.0–15.0)
Immature Granulocytes: 0 %
Lymphocytes Relative: 10 %
Lymphs Abs: 0.7 10*3/uL (ref 0.7–4.0)
MCH: 20.5 pg — ABNORMAL LOW (ref 26.0–34.0)
MCHC: 30.9 g/dL (ref 30.0–36.0)
MCV: 66.2 fL — ABNORMAL LOW (ref 80.0–100.0)
Monocytes Absolute: 0.6 10*3/uL (ref 0.1–1.0)
Monocytes Relative: 9 %
Neutro Abs: 5.5 10*3/uL (ref 1.7–7.7)
Neutrophils Relative %: 80 %
Platelet Count: 326 10*3/uL (ref 150–400)
RBC: 4.64 MIL/uL (ref 3.87–5.11)
RDW: 16.6 % — ABNORMAL HIGH (ref 11.5–15.5)
WBC Count: 7 10*3/uL (ref 4.0–10.5)
nRBC: 0 % (ref 0.0–0.2)

## 2019-08-22 LAB — CMP (CANCER CENTER ONLY)
ALT: 12 U/L (ref 0–44)
AST: 15 U/L (ref 15–41)
Albumin: 3.2 g/dL — ABNORMAL LOW (ref 3.5–5.0)
Alkaline Phosphatase: 127 U/L — ABNORMAL HIGH (ref 38–126)
Anion gap: 9 (ref 5–15)
BUN: 7 mg/dL (ref 6–20)
CO2: 24 mmol/L (ref 22–32)
Calcium: 8.4 mg/dL — ABNORMAL LOW (ref 8.9–10.3)
Chloride: 101 mmol/L (ref 98–111)
Creatinine: 0.59 mg/dL (ref 0.44–1.00)
GFR, Est AFR Am: >60 mL/min (ref >60)
GFR, Estimated: >60 mL/min (ref >60)
Glucose, Bld: 98 mg/dL (ref 70–99)
Potassium: 4.3 mmol/L (ref 3.5–5.1)
Sodium: 134 mmol/L — ABNORMAL LOW (ref 135–145)
Total Bilirubin: 0.2 mg/dL — ABNORMAL LOW (ref 0.3–1.2)
Total Protein: 6.1 g/dL — ABNORMAL LOW (ref 6.5–8.1)

## 2019-08-22 LAB — MAGNESIUM: Magnesium: 2.1 mg/dL (ref 1.7–2.4)

## 2019-08-22 LAB — TOTAL PROTEIN, URINE DIPSTICK: Protein, ur: NEGATIVE mg/dL

## 2019-08-22 MED ORDER — SODIUM CHLORIDE 0.9 % IV SOLN
150.0000 mg/m2 | Freq: Once | INTRAVENOUS | Status: AC
  Administered 2019-08-22: 240 mg via INTRAVENOUS
  Filled 2019-08-22: qty 12

## 2019-08-22 MED ORDER — PALONOSETRON HCL INJECTION 0.25 MG/5ML
INTRAVENOUS | Status: AC
  Filled 2019-08-22: qty 5

## 2019-08-22 MED ORDER — SODIUM CHLORIDE 0.9 % IV SOLN
Freq: Once | INTRAVENOUS | Status: AC
  Filled 2019-08-22: qty 5

## 2019-08-22 MED ORDER — SODIUM CHLORIDE 0.9 % IV SOLN
200.0000 mg/m2 | Freq: Once | INTRAVENOUS | Status: AC
  Administered 2019-08-22: 318 mg via INTRAVENOUS
  Filled 2019-08-22: qty 15.9

## 2019-08-22 MED ORDER — SODIUM CHLORIDE 0.9 % IV SOLN
5.0000 mg/kg | Freq: Once | INTRAVENOUS | Status: AC
  Administered 2019-08-22: 300 mg via INTRAVENOUS
  Filled 2019-08-22: qty 12

## 2019-08-22 MED ORDER — SODIUM CHLORIDE 0.9 % IV SOLN
Freq: Once | INTRAVENOUS | Status: DC
  Filled 2019-08-22: qty 250

## 2019-08-22 MED ORDER — PALONOSETRON HCL INJECTION 0.25 MG/5ML
0.2500 mg | Freq: Once | INTRAVENOUS | Status: AC
  Administered 2019-08-22: 0.25 mg via INTRAVENOUS

## 2019-08-22 MED ORDER — SODIUM CHLORIDE 0.9 % IV SOLN
Freq: Once | INTRAVENOUS | Status: AC
  Filled 2019-08-22: qty 250

## 2019-08-22 MED ORDER — SODIUM CHLORIDE 0.9 % IV SOLN
3200.0000 mg/m2 | INTRAVENOUS | Status: DC
  Administered 2019-08-22: 5100 mg via INTRAVENOUS
  Filled 2019-08-22: qty 102

## 2019-08-22 NOTE — Progress Notes (Signed)
Tina Morales   Diagnosis: Rectal cancer  INTERVAL HISTORY:   Tina Morales completed another cycle of FOLFIRI/Avastin on 08/09/2019.  She has mouth sores and nausea following chemotherapy.  She is able to eat.  She continues to have pain at the left lower leg that is worse following chemotherapy.  No leg swelling or erythema.  She has mild nosebleeding.  She also has bleeding from irritated skin at the ostomy site.  No bleeding from the bowels.  The perineal wound remains intact.  She has abdominal cramping for several days following chemotherapy.  Objective:  Vital signs in last 24 hours:  Blood pressure 123/80, pulse 98, temperature 98.2 F (36.8 C), temperature source Temporal, resp. rate 17, height _0  (1.702 m), weight 132 lb (59.9 kg), SpO2 100 %.    HEENT: No thrush or ulcers GI: No hepatosplenomegaly, no mass, nontender, left lower quadrant colostomy with brown stool Vascular: No leg edema, erythema, or palpable cord  Skin: Palms without erythema  Portacath/PICC-without erythema  Lab Results:  Lab Results  Component Value Date   WBC 7.0 08/22/2019   HGB 9.5 (L) 08/22/2019   HCT 30.7 (L) 08/22/2019   MCV 66.2 (L) 08/22/2019   PLT 326 08/22/2019   NEUTROABS 5.5 08/22/2019    CMP  Lab Results  Component Value Date   NA 134 (L) 08/22/2019   K 4.3 08/22/2019   CL 101 08/22/2019   CO2 24 08/22/2019   GLUCOSE 98 08/22/2019   BUN 7 08/22/2019   CREATININE 0.59 08/22/2019   CALCIUM 8.4 (L) 08/22/2019   PROT 6.1 (L) 08/22/2019   ALBUMIN 3.2 (L) 08/22/2019   AST 15 08/22/2019   ALT 12 08/22/2019   ALKPHOS 127 (H) 08/22/2019   BILITOT 0.2 (L) 08/22/2019   GFRNONAA >60 08/22/2019   GFRAA >60 08/22/2019    Lab Results  Component Value Date   CEA1 1.95 06/13/2019     Medications: I have reviewed the patient's current medications.   Assessment/Plan: 1. Rectal cancer-rectal mass noted on digital exam 02/10/2018, colonoscopy  confirmed a him my circumferential mass in the rectum ? 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 CTs 02/10/2018-anterior rectal wall thickening, pulmonary nodules measuring up to 9 mm concerning for metastases, few round perirectal lymph nodes ? Pelvic MRI 02/20/2018-hypermetabolic low rectal mass extending to the posterior vagina with 2 small enlarged perirectal lymph nodes, T4b,N1-2.3 cm from the anal verge ? PET scan 02/23/2018-hypermetabolic rectal mass, 8 mm hypermetabolic lingular nodule, scattered small bilateral lung nodules, some calcified, a few with mildly increased activity ? Cycle 1 FOLFOXIRI8/21/2019 ? Cycle 5 FOLFOXIRI10/16/2019 ? CTs 05/01/2018-significant interval response to therapy with decreased size of the primary rectal mass lesion. Decreasing perirectal lymphadenopathy. Decreased and/or resolved pulmonary nodules. No new sites of disease identified. ? Cycle 6 FOLFOXIRI10/31/2019 ? Radiation/Xeloda 06/12/2018-completed 07/21/2018 ? Xeloda dose reduced 07/10/2018 due to mucositis and diarrhea ? CTs 08/07/2018-compared to 02/10/2018-resolved and decreased pulmonary nodules, few tiny residual noncalcified nodules, decreased 20 perirectal lymph nodes, soft tissue of the rectum indistinguishable from posterior wall of vagina-Vaginal involvement by tumor? ? 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 CT chest 09/29/2018-enlargement of lung nodules, not  a candidate for SBRT based on discussion in GI tumor conference and with radiation  oncology ? CT chest 11/27/18 - interval growth of numerous pulmonary metastases bilaterally compared to 09/29/18, no new pulmonary mets ? Cycle 1FOLFOXIRI 11/29/18 ? Cycle 2 FOLFOXIRI6/10/2018 ? Cycle 3 FOLFOXIRI 12/28/2018 ? Cycle 4 FOLFIRINOX 01/11/2019 ? CTs 01/24/2019-significant improvement of bilateral pulmonary metastases. ? Cycle 1 FOLFIRI 02/01/2019 ? Cycle 2 FOLFIRI 02/22/2019 ? Cycle 3 FOLFIRI 03/15/2019 ? Cycle 4 FOLFIRI 04/05/2019 ? Cycle 5 FOLFIRI 04/26/2019 ? CTs 05/14/2019-slight enlargement of right lung nodules, no other evidence of disease progression ? Cycle 6 FOLFIRI 05/17/2019 ? Cycle 7 FOLFIRI plus Avastin 06/13/2019 ? Cycle 8 FOLFIRI plus Avastin 07/09/2019 ? Cycle 9 FOLFIRI plus Avastin 07/25/2019 ? Cycle 10 FOLFIRI plus Avastin 08/09/2019 ? 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 Imodium as needed. 6.Mucositis secondary to Xeloda. Improved 07/19/2018. 7. History of migraines     Disposition: Tina Morales appears unchanged.  She will complete another treatment with FOLFIRI/Avastin today.  She will undergo restaging CTs after this cycle.  She will call for increased skin breakdown at the ostomy site.  It is possible Avastin or chemotherapy is contributing.  I have a low clinical suspicion for a deep vein thrombosis at the left lower leg.  There is no swelling or tenderness.  The leg pain has been present for several months.  She would like to change the chemotherapy to an every 3-week interval.  She will be scheduled for an office visit and chemotherapy in 3 weeks.  Betsy Coder, MD  08/22/2019  11:57 AM

## 2019-08-22 NOTE — Patient Instructions (Signed)
[facility] Discharge Instructions for Patients Receiving Chemotherapy  Today you received the following chemotherapy agents: Bevacizumab, Irinotecan, Leucovorin, 5FU  To help prevent nausea and vomiting after your treatment, we encourage you to take your nausea medication as directed.    If you develop nausea and vomiting that is not controlled by your nausea medication, call the clinic.   BELOW ARE SYMPTOMS THAT SHOULD BE REPORTED IMMEDIATELY:  *FEVER GREATER THAN 100.5 F  *CHILLS WITH OR WITHOUT FEVER  NAUSEA AND VOMITING THAT IS NOT CONTROLLED WITH YOUR NAUSEA MEDICATION  *UNUSUAL SHORTNESS OF BREATH  *UNUSUAL BRUISING OR BLEEDING  TENDERNESS IN MOUTH AND THROAT WITH OR WITHOUT PRESENCE OF ULCERS  *URINARY PROBLEMS  *BOWEL PROBLEMS  UNUSUAL RASH Items with * indicate a potential emergency and should be followed up as soon as possible.  Feel free to call the clinic should you have any questions or concerns. The clinic phone number is [phone].  Please show the CHEMO ALERT CARD at check-in to the Emergency Department and triage nurse.   

## 2019-08-24 ENCOUNTER — Inpatient Hospital Stay: Payer: BC Managed Care – PPO

## 2019-08-24 ENCOUNTER — Other Ambulatory Visit: Payer: Self-pay

## 2019-08-24 VITALS — BP 114/78 | HR 85 | Temp 99.1°F | Resp 18

## 2019-08-24 DIAGNOSIS — Z95828 Presence of other vascular implants and grafts: Secondary | ICD-10-CM

## 2019-08-24 DIAGNOSIS — C20 Malignant neoplasm of rectum: Secondary | ICD-10-CM | POA: Diagnosis not present

## 2019-08-24 MED ORDER — HEPARIN SOD (PORK) LOCK FLUSH 100 UNIT/ML IV SOLN
500.0000 [IU] | Freq: Once | INTRAVENOUS | Status: AC
  Administered 2019-08-24: 500 [IU]
  Filled 2019-08-24: qty 5

## 2019-08-24 MED ORDER — SODIUM CHLORIDE 0.9% FLUSH
10.0000 mL | Freq: Once | INTRAVENOUS | Status: AC
  Administered 2019-08-24: 10 mL
  Filled 2019-08-24: qty 10

## 2019-08-24 NOTE — Patient Instructions (Signed)

## 2019-08-30 ENCOUNTER — Telehealth: Payer: Self-pay | Admitting: Oncology

## 2019-08-30 NOTE — Telephone Encounter (Signed)
I talk with patient regarding schedule  

## 2019-09-06 ENCOUNTER — Other Ambulatory Visit: Payer: BC Managed Care – PPO

## 2019-09-06 ENCOUNTER — Ambulatory Visit: Payer: BC Managed Care – PPO

## 2019-09-06 ENCOUNTER — Ambulatory Visit: Payer: BC Managed Care – PPO | Admitting: Nurse Practitioner

## 2019-09-07 ENCOUNTER — Other Ambulatory Visit: Payer: Self-pay

## 2019-09-07 ENCOUNTER — Ambulatory Visit (HOSPITAL_COMMUNITY)
Admission: RE | Admit: 2019-09-07 | Discharge: 2019-09-07 | Disposition: A | Discharge status: Home / Self Care | Payer: BC Managed Care – PPO | Source: Ambulatory Visit | Attending: Oncology | Admitting: Oncology

## 2019-09-07 DIAGNOSIS — C20 Malignant neoplasm of rectum: Secondary | ICD-10-CM | POA: Diagnosis not present

## 2019-09-07 MED ORDER — IOHEXOL 300 MG/ML  SOLN
100.0000 mL | Freq: Once | INTRAMUSCULAR | Status: AC | PRN
  Administered 2019-09-07: 100 mL via INTRAVENOUS

## 2019-09-07 MED ORDER — HEPARIN SOD (PORK) LOCK FLUSH 100 UNIT/ML IV SOLN
500.0000 [IU] | Freq: Once | INTRAVENOUS | Status: AC
  Administered 2019-09-07: 500 [IU] via INTRAVENOUS

## 2019-09-07 MED ORDER — SODIUM CHLORIDE (PF) 0.9 % IJ SOLN
INTRAMUSCULAR | Status: AC
  Filled 2019-09-07: qty 50

## 2019-09-07 MED ORDER — HEPARIN SOD (PORK) LOCK FLUSH 100 UNIT/ML IV SOLN
INTRAVENOUS | Status: AC
  Filled 2019-09-07: qty 5

## 2019-09-08 ENCOUNTER — Other Ambulatory Visit: Payer: Self-pay | Admitting: Oncology

## 2019-09-12 ENCOUNTER — Other Ambulatory Visit: Payer: Self-pay | Admitting: *Deleted

## 2019-09-12 DIAGNOSIS — C20 Malignant neoplasm of rectum: Secondary | ICD-10-CM

## 2019-09-12 MED ORDER — DEXAMETHASONE 4 MG PO TABS: ORAL_TABLET | ORAL | 2 refills | Status: DC

## 2019-09-13 ENCOUNTER — Other Ambulatory Visit: Payer: Self-pay

## 2019-09-13 ENCOUNTER — Inpatient Hospital Stay: Payer: BC Managed Care – PPO

## 2019-09-13 ENCOUNTER — Encounter: Payer: Self-pay | Admitting: Nurse Practitioner

## 2019-09-13 ENCOUNTER — Inpatient Hospital Stay (HOSPITAL_BASED_OUTPATIENT_CLINIC_OR_DEPARTMENT_OTHER): Payer: BC Managed Care – PPO | Admitting: Nurse Practitioner

## 2019-09-13 ENCOUNTER — Inpatient Hospital Stay: Payer: BC Managed Care – PPO | Attending: Oncology

## 2019-09-13 VITALS — BP 124/89 | HR 103 | Temp 98.9°F | Resp 16 | Ht 67.0 in | Wt 132.5 lb

## 2019-09-13 VITALS — HR 95

## 2019-09-13 DIAGNOSIS — Z5111 Encounter for antineoplastic chemotherapy: Secondary | ICD-10-CM | POA: Insufficient documentation

## 2019-09-13 DIAGNOSIS — C20 Malignant neoplasm of rectum: Secondary | ICD-10-CM

## 2019-09-13 DIAGNOSIS — Z5112 Encounter for antineoplastic immunotherapy: Secondary | ICD-10-CM | POA: Diagnosis not present

## 2019-09-13 DIAGNOSIS — Z95828 Presence of other vascular implants and grafts: Secondary | ICD-10-CM

## 2019-09-13 DIAGNOSIS — C7801 Secondary malignant neoplasm of right lung: Secondary | ICD-10-CM | POA: Diagnosis present

## 2019-09-13 LAB — CBC WITH DIFFERENTIAL (CANCER CENTER ONLY)
Abs Immature Granulocytes: 0.14 10*3/uL — ABNORMAL HIGH (ref 0.00–0.07)
Basophils Absolute: 0 10*3/uL (ref 0.0–0.1)
Basophils Relative: 0 %
Eosinophils Absolute: 0.1 10*3/uL (ref 0.0–0.5)
Eosinophils Relative: 2 %
HCT: 31 % — ABNORMAL LOW (ref 36.0–46.0)
Hemoglobin: 9.7 g/dL — ABNORMAL LOW (ref 12.0–15.0)
Immature Granulocytes: 2 %
Lymphocytes Relative: 18 %
Lymphs Abs: 1.3 10*3/uL (ref 0.7–4.0)
MCH: 20.6 pg — ABNORMAL LOW (ref 26.0–34.0)
MCHC: 31.3 g/dL (ref 30.0–36.0)
MCV: 66 fL — ABNORMAL LOW (ref 80.0–100.0)
Monocytes Absolute: 0.8 10*3/uL (ref 0.1–1.0)
Monocytes Relative: 11 %
Neutro Abs: 5.1 10*3/uL (ref 1.7–7.7)
Neutrophils Relative %: 67 %
Platelet Count: 323 10*3/uL (ref 150–400)
RBC: 4.7 MIL/uL (ref 3.87–5.11)
RDW: 18.4 % — ABNORMAL HIGH (ref 11.5–15.5)
WBC Count: 7.5 10*3/uL (ref 4.0–10.5)
nRBC: 0 % (ref 0.0–0.2)

## 2019-09-13 LAB — CMP (CANCER CENTER ONLY)
ALT: 9 U/L (ref 0–44)
AST: 16 U/L (ref 15–41)
Albumin: 3.3 g/dL — ABNORMAL LOW (ref 3.5–5.0)
Alkaline Phosphatase: 120 U/L (ref 38–126)
Anion gap: 7 (ref 5–15)
BUN: 7 mg/dL (ref 6–20)
CO2: 24 mmol/L (ref 22–32)
Calcium: 8.2 mg/dL — ABNORMAL LOW (ref 8.9–10.3)
Chloride: 102 mmol/L (ref 98–111)
Creatinine: 0.61 mg/dL (ref 0.44–1.00)
GFR, Est AFR Am: >60 mL/min (ref >60)
GFR, Estimated: >60 mL/min (ref >60)
Glucose, Bld: 91 mg/dL (ref 70–99)
Potassium: 4.7 mmol/L (ref 3.5–5.1)
Sodium: 133 mmol/L — ABNORMAL LOW (ref 135–145)
Total Bilirubin: 0.3 mg/dL (ref 0.3–1.2)
Total Protein: 5.8 g/dL — ABNORMAL LOW (ref 6.5–8.1)

## 2019-09-13 LAB — MAGNESIUM: Magnesium: 1.8 mg/dL (ref 1.7–2.4)

## 2019-09-13 LAB — TOTAL PROTEIN, URINE DIPSTICK: Protein, ur: NEGATIVE mg/dL

## 2019-09-13 MED ORDER — SODIUM CHLORIDE 0.9 % IV SOLN
3200.0000 mg/m2 | INTRAVENOUS | Status: DC
  Administered 2019-09-13: 5100 mg via INTRAVENOUS
  Filled 2019-09-13: qty 102

## 2019-09-13 MED ORDER — PALONOSETRON HCL INJECTION 0.25 MG/5ML
INTRAVENOUS | Status: AC
  Filled 2019-09-13: qty 5

## 2019-09-13 MED ORDER — SODIUM CHLORIDE 0.9% FLUSH
10.0000 mL | INTRAVENOUS | Status: DC | PRN
  Filled 2019-09-13: qty 10

## 2019-09-13 MED ORDER — HEPARIN SOD (PORK) LOCK FLUSH 100 UNIT/ML IV SOLN
500.0000 [IU] | Freq: Once | INTRAVENOUS | Status: DC | PRN
  Filled 2019-09-13: qty 5

## 2019-09-13 MED ORDER — SODIUM CHLORIDE 0.9 % IV SOLN
Freq: Once | INTRAVENOUS | Status: AC
  Filled 2019-09-13: qty 250

## 2019-09-13 MED ORDER — SODIUM CHLORIDE 0.9 % IV SOLN
Freq: Once | INTRAVENOUS | Status: AC
  Filled 2019-09-13: qty 5

## 2019-09-13 MED ORDER — SODIUM CHLORIDE 0.9 % IV SOLN
Freq: Once | INTRAVENOUS | Status: DC
  Filled 2019-09-13: qty 250

## 2019-09-13 MED ORDER — PALONOSETRON HCL INJECTION 0.25 MG/5ML
0.2500 mg | Freq: Once | INTRAVENOUS | Status: AC
  Administered 2019-09-13: 0.25 mg via INTRAVENOUS

## 2019-09-13 MED ORDER — LEUCOVORIN CALCIUM INJECTION 350 MG
200.0000 mg/m2 | Freq: Once | INTRAVENOUS | Status: AC
  Administered 2019-09-13: 13:00:00 318 mg via INTRAVENOUS
  Filled 2019-09-13: qty 15.9

## 2019-09-13 MED ORDER — IRINOTECAN HCL CHEMO INJECTION 100 MG/5ML
150.0000 mg/m2 | Freq: Once | INTRAVENOUS | Status: AC
  Administered 2019-09-13: 13:00:00 240 mg via INTRAVENOUS
  Filled 2019-09-13: qty 12

## 2019-09-13 MED ORDER — SODIUM CHLORIDE 0.9% FLUSH
10.0000 mL | Freq: Once | INTRAVENOUS | Status: AC
  Administered 2019-09-13: 10 mL
  Filled 2019-09-13: qty 10

## 2019-09-13 MED ORDER — SODIUM CHLORIDE 0.9 % IV SOLN
5.0000 mg/kg | Freq: Once | INTRAVENOUS | Status: AC
  Administered 2019-09-13: 300 mg via INTRAVENOUS
  Filled 2019-09-13: qty 12

## 2019-09-13 NOTE — Patient Instructions (Signed)
[facility] Discharge Instructions for Patients Receiving Chemotherapy  Today you received the following chemotherapy agents: Bevacizumab, Irinotecan, Leucovorin, 5FU  To help prevent nausea and vomiting after your treatment, we encourage you to take your nausea medication as directed.    If you develop nausea and vomiting that is not controlled by your nausea medication, call the clinic.   BELOW ARE SYMPTOMS THAT SHOULD BE REPORTED IMMEDIATELY:  *FEVER GREATER THAN 100.5 F  *CHILLS WITH OR WITHOUT FEVER  NAUSEA AND VOMITING THAT IS NOT CONTROLLED WITH YOUR NAUSEA MEDICATION  *UNUSUAL SHORTNESS OF BREATH  *UNUSUAL BRUISING OR BLEEDING  TENDERNESS IN MOUTH AND THROAT WITH OR WITHOUT PRESENCE OF ULCERS  *URINARY PROBLEMS  *BOWEL PROBLEMS  UNUSUAL RASH Items with * indicate a potential emergency and should be followed up as soon as possible.  Feel free to call the clinic should you have any questions or concerns. The clinic phone number is [phone].  Please show the CHEMO ALERT CARD at check-in to the Emergency Department and triage nurse.   

## 2019-09-13 NOTE — Progress Notes (Addendum)
[place] OFFICE PROGRESS NOTE   Diagnosis: Rectal cancer  INTERVAL HISTORY:   Tina Morales returns as scheduled.  She completed cycle 11 FOLFIRI plus Avastin 08/22/2019.  Postchemotherapy symptoms largely unchanged.  She has intermittent nausea after chemo.  She continues to note fatigue.  She has mouth sores.  Mouth sores did not affect oral intake.  Utilizes Magic mouthwash.  She continues to have headaches after chemotherapy.  She has periodic abdominal cramping and diarrhea.  She has some bleeding around the stoma after chemotherapy.  Last week after the CT scan she had "external bleeding from the vagina".  She noted an approximate 1 inch "split" in the skin which has resolved.  Objective:  Vital signs in last 24 hours:  Blood pressure 124/89, pulse (!) 103, temperature 98.9 F (37.2 C), temperature source Temporal, resp. rate 16, height 5' 7"  (1.702 m), weight 132 lb 8 oz (60.1 kg), SpO2 100 %.    HEENT: No thrush or ulcers. GI: Abdomen soft and nontender.  No hepatomegaly.  Left lower quadrant colostomy. Vascular: No leg edema. Neuro: Alert and oriented. Skin: Palms without erythema. Port-A-Cath without erythema.   Lab Results:  Lab Results  Component Value Date   WBC 7.5 09/13/2019   HGB 9.7 (L) 09/13/2019   HCT 31.0 (L) 09/13/2019   MCV 66.0 (L) 09/13/2019   PLT 323 09/13/2019   NEUTROABS 5.1 09/13/2019    Imaging:  No results found.  Medications: I have reviewed the patient's current medications.  Assessment/Plan: 1. Rectal cancer-rectal mass noted on digital exam 02/10/2018, colonoscopy confirmed a him my circumferential mass in the rectum ? 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 CTs 02/10/2018-anterior rectal wall  thickening, pulmonary nodules measuring up to 9 mm concerning for metastases, few round perirectal lymph nodes ? Pelvic MRI 02/20/2018-hypermetabolic low rectal mass extending to the posterior vagina with 2 small enlarged perirectal lymph nodes, T4b,N1-2.3 cm from the anal verge ? PET scan 02/23/2018-hypermetabolic rectal mass, 8 mm hypermetabolic lingular nodule, scattered small bilateral lung nodules, some calcified, a few with mildly increased activity ? Cycle 1 FOLFOXIRI8/21/2019 ? Cycle 5 FOLFOXIRI10/16/2019 ? CTs 05/01/2018-significant interval response to therapy with decreased size of the primary rectal mass lesion. Decreasing perirectal lymphadenopathy. Decreased and/or resolved pulmonary nodules. No new sites of disease identified. ? Cycle 6 FOLFOXIRI10/31/2019 ? Radiation/Xeloda 06/12/2018-completed 07/21/2018 ? Xeloda dose reduced 07/10/2018 due to mucositis and diarrhea ? CTs 08/07/2018-compared to 02/10/2018-resolved and decreased pulmonary nodules, few tiny residual noncalcified nodules, decreased 20 perirectal lymph nodes, soft tissue of the rectum indistinguishable from posterior wall of vagina-Vaginal involvement by tumor? ? 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 CT chest 09/29/2018-enlargement of lung nodules, not a candidate for SBRT based on discussion in GI tumor conference and with radiation oncology ? CT chest 11/27/18 - interval growth of numerous pulmonary metastases bilaterally compared to 09/29/18, no new pulmonary mets ? Cycle 1FOLFOXIRI 11/29/18 ? Cycle 2 FOLFOXIRI6/10/2018 ? Cycle 3 FOLFOXIRI 12/28/2018 ? Cycle 4 FOLFIRINOX 01/11/2019 ? CTs 01/24/2019-significant improvement of bilateral pulmonary metastases. ? Cycle 1 FOLFIRI 02/01/2019 ? Cycle  2 FOLFIRI 02/22/2019 ? Cycle 3 FOLFIRI 03/15/2019 ? Cycle 4 FOLFIRI 04/05/2019 ? Cycle 5 FOLFIRI 04/26/2019 ?  CTs 05/14/2019-slight enlargement of right lung nodules, no other evidence of disease progression ? Cycle 6 FOLFIRI 05/17/2019 ? Cycle 7 FOLFIRI plus Avastin 06/13/2019 ? Cycle 8 FOLFIRI plus Avastin 07/09/2019 ? Cycle 9 FOLFIRI plus Avastin 07/25/2019 ? Cycle 10 FOLFIRI plus Avastin 08/09/2019 ? Cycle 11 FOLFIRI plus Avastin 08/22/2019 ? CTs 09/07/2019-most pulmonary nodules are stable.  1 nodule right lower lobe slightly larger.  No new lung lesions. ? 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 Imodium as needed. 6.Mucositis secondary to Xeloda. Improved 07/19/2018. 7. History of migraines    Disposition: Tina Morales appears stable.  She has completed 11 cycles of FOLFIRI/5 cycles of Avastin.  The recent restaging CT scans show overall stable disease.  1 lung nodule was slightly larger.  There was no new disease.  Dr. Benay Spice recommends continuation of FOLFIRI/Avastin for now, consideration for maintenance therapy with 5-fluorouracil/Avastin after several more cycles of the current regimen.  She agrees with this plan.  Plan to proceed with FOLFIRI/Avastin today as scheduled.  I reviewed the blood work from today.  Labs are adequate to proceed as above.  She will return for lab, follow-up, FOLFIRI/Avastin in 3 weeks.  She will contact the office in the interim with any problems.  Patient seen with Dr. Benay Spice.  CT images reviewed on the computer with Tina Morales at today's visit.  Ned Card ANP/GNP-BC   09/13/2019  10:26 AM This was a shared visit with Ned Card.  Tina Morales appears unchanged.  We reviewed the restaging images with her.  The overall findings are  consistent with stable disease.  The plan is to continue FOLFIRI/Avastin.  We will consider switching to maintenance 5-FU/Avastin if there is no disease progression on the next restaging CT.  Julieanne Manson, MD

## 2019-09-15 ENCOUNTER — Other Ambulatory Visit: Payer: Self-pay

## 2019-09-15 ENCOUNTER — Inpatient Hospital Stay: Payer: BC Managed Care – PPO

## 2019-09-15 VITALS — BP 118/88 | HR 87 | Temp 98.9°F | Resp 16

## 2019-09-15 DIAGNOSIS — C20 Malignant neoplasm of rectum: Secondary | ICD-10-CM | POA: Diagnosis not present

## 2019-09-15 MED ORDER — SODIUM CHLORIDE 0.9% FLUSH
10.0000 mL | INTRAVENOUS | Status: DC | PRN
  Administered 2019-09-15: 10 mL
  Filled 2019-09-15: qty 10

## 2019-09-15 MED ORDER — HEPARIN SOD (PORK) LOCK FLUSH 100 UNIT/ML IV SOLN
500.0000 [IU] | Freq: Once | INTRAVENOUS | Status: AC | PRN
  Administered 2019-09-15: 500 [IU]
  Filled 2019-09-15: qty 5

## 2019-09-15 NOTE — Patient Instructions (Signed)

## 2019-09-17 ENCOUNTER — Telehealth: Payer: Self-pay | Admitting: *Deleted

## 2019-09-17 ENCOUNTER — Telehealth: Payer: Self-pay | Admitting: Oncology

## 2019-09-17 DIAGNOSIS — C20 Malignant neoplasm of rectum: Secondary | ICD-10-CM

## 2019-09-17 MED ORDER — POTASSIUM CHLORIDE CRYS ER 20 MEQ PO TBCR: 20.0000 meq | EXTENDED_RELEASE_TABLET | Freq: Two times a day (BID) | ORAL | 1 refills | Status: DC

## 2019-09-17 MED ORDER — ALPRAZOLAM 0.5 MG PO TABS: 0.5000 mg | ORAL_TABLET | Freq: Two times a day (BID) | ORAL | 0 refills | Status: DC | PRN

## 2019-09-17 NOTE — Telephone Encounter (Signed)
Scheduled per los. Called and spoke with patient. Confirmed appts  

## 2019-09-17 NOTE — Telephone Encounter (Signed)
Requested refills on KCL and Xanax.

## 2019-09-28 NOTE — Progress Notes (Signed)
Pharmacist Chemotherapy Monitoring - Follow Up Assessment    I verify that I have reviewed each item in the below checklist:  . Regimen for the patient is scheduled for the appropriate day and plan matches scheduled date. Marland Kitchen Appropriate non-routine labs are ordered dependent on drug ordered. . If applicable, additional medications reviewed and ordered per protocol based on lifetime cumulative doses and/or treatment regimen.   Plan for follow-up and/or issues identified: No . I-vent associated with next due treatment: No . MD and/or nursing notified: No    Kennith Center, Pharm.D., CPP 09/28/2019@3 :50 PM

## 2019-09-30 ENCOUNTER — Other Ambulatory Visit: Payer: Self-pay | Admitting: Oncology

## 2019-10-04 ENCOUNTER — Inpatient Hospital Stay: Payer: BC Managed Care – PPO

## 2019-10-04 ENCOUNTER — Other Ambulatory Visit: Payer: Self-pay

## 2019-10-04 ENCOUNTER — Inpatient Hospital Stay (HOSPITAL_BASED_OUTPATIENT_CLINIC_OR_DEPARTMENT_OTHER): Payer: BC Managed Care – PPO | Admitting: Oncology

## 2019-10-04 VITALS — BP 131/87 | HR 114 | Temp 99.1°F | Resp 18 | Ht 67.0 in | Wt 132.0 lb

## 2019-10-04 VITALS — BP 108/81 | HR 80 | Resp 17

## 2019-10-04 DIAGNOSIS — C20 Malignant neoplasm of rectum: Secondary | ICD-10-CM

## 2019-10-04 LAB — CBC WITH DIFFERENTIAL (CANCER CENTER ONLY)
Abs Immature Granulocytes: 0.05 10*3/uL (ref 0.00–0.07)
Basophils Absolute: 0 10*3/uL (ref 0.0–0.1)
Basophils Relative: 1 %
Eosinophils Absolute: 0.1 10*3/uL (ref 0.0–0.5)
Eosinophils Relative: 2 %
HCT: 34.4 % — ABNORMAL LOW (ref 36.0–46.0)
Hemoglobin: 10.6 g/dL — ABNORMAL LOW (ref 12.0–15.0)
Immature Granulocytes: 1 %
Lymphocytes Relative: 16 %
Lymphs Abs: 0.9 10*3/uL (ref 0.7–4.0)
MCH: 20.6 pg — ABNORMAL LOW (ref 26.0–34.0)
MCHC: 30.8 g/dL (ref 30.0–36.0)
MCV: 66.9 fL — ABNORMAL LOW (ref 80.0–100.0)
Monocytes Absolute: 0.7 10*3/uL (ref 0.1–1.0)
Monocytes Relative: 13 %
Neutro Abs: 3.7 10*3/uL (ref 1.7–7.7)
Neutrophils Relative %: 67 %
Platelet Count: 307 10*3/uL (ref 150–400)
RBC: 5.14 MIL/uL — ABNORMAL HIGH (ref 3.87–5.11)
RDW: 18.3 % — ABNORMAL HIGH (ref 11.5–15.5)
WBC Count: 5.4 10*3/uL (ref 4.0–10.5)
nRBC: 0 % (ref 0.0–0.2)

## 2019-10-04 LAB — CMP (CANCER CENTER ONLY)
ALT: 15 U/L (ref 0–44)
AST: 17 U/L (ref 15–41)
Albumin: 3.5 g/dL (ref 3.5–5.0)
Alkaline Phosphatase: 123 U/L (ref 38–126)
Anion gap: 8 (ref 5–15)
BUN: 7 mg/dL (ref 6–20)
CO2: 24 mmol/L (ref 22–32)
Calcium: 8.9 mg/dL (ref 8.9–10.3)
Chloride: 101 mmol/L (ref 98–111)
Creatinine: 0.61 mg/dL (ref 0.44–1.00)
GFR, Est AFR Am: >60 mL/min (ref >60)
GFR, Estimated: >60 mL/min (ref >60)
Glucose, Bld: 99 mg/dL (ref 70–99)
Potassium: 4.5 mmol/L (ref 3.5–5.1)
Sodium: 133 mmol/L — ABNORMAL LOW (ref 135–145)
Total Bilirubin: 0.4 mg/dL (ref 0.3–1.2)
Total Protein: 6.1 g/dL — ABNORMAL LOW (ref 6.5–8.1)

## 2019-10-04 LAB — TOTAL PROTEIN, URINE DIPSTICK: Protein, ur: NEGATIVE mg/dL

## 2019-10-04 LAB — MAGNESIUM: Magnesium: 1.9 mg/dL (ref 1.7–2.4)

## 2019-10-04 MED ORDER — IRINOTECAN HCL CHEMO INJECTION 100 MG/5ML
150.0000 mg/m2 | Freq: Once | INTRAVENOUS | Status: AC
  Administered 2019-10-04: 240 mg via INTRAVENOUS
  Filled 2019-10-04: qty 12

## 2019-10-04 MED ORDER — SODIUM CHLORIDE 0.9 % IV SOLN
3200.0000 mg/m2 | INTRAVENOUS | Status: DC
  Administered 2019-10-04: 5100 mg via INTRAVENOUS
  Filled 2019-10-04: qty 102

## 2019-10-04 MED ORDER — SODIUM CHLORIDE 0.9 % IV SOLN
Freq: Once | INTRAVENOUS | Status: AC
  Filled 2019-10-04: qty 5

## 2019-10-04 MED ORDER — SODIUM CHLORIDE 0.9 % IV SOLN
5.0000 mg/kg | Freq: Once | INTRAVENOUS | Status: AC
  Administered 2019-10-04: 300 mg via INTRAVENOUS
  Filled 2019-10-04: qty 12

## 2019-10-04 MED ORDER — PALONOSETRON HCL INJECTION 0.25 MG/5ML
INTRAVENOUS | Status: AC
  Filled 2019-10-04: qty 5

## 2019-10-04 MED ORDER — PALONOSETRON HCL INJECTION 0.25 MG/5ML
0.2500 mg | Freq: Once | INTRAVENOUS | Status: AC
  Administered 2019-10-04: 0.25 mg via INTRAVENOUS

## 2019-10-04 MED ORDER — SODIUM CHLORIDE 0.9 % IV SOLN
Freq: Once | INTRAVENOUS | Status: AC
  Filled 2019-10-04: qty 250

## 2019-10-04 MED ORDER — DIPHENOXYLATE-ATROPINE 2.5-0.025 MG PO TABS: 2.0000 | ORAL_TABLET | Freq: Four times a day (QID) | ORAL | 0 refills | Status: DC | PRN

## 2019-10-04 MED ORDER — LEUCOVORIN CALCIUM INJECTION 350 MG
200.0000 mg/m2 | Freq: Once | INTRAVENOUS | Status: AC
  Administered 2019-10-04: 318 mg via INTRAVENOUS
  Filled 2019-10-04: qty 15.9

## 2019-10-04 NOTE — Telephone Encounter (Signed)
Phone-in refill for Lomotil done

## 2019-10-04 NOTE — Patient Instructions (Signed)
[facility] Discharge Instructions for Patients Receiving Chemotherapy  Today you received the following chemotherapy agents: bevacizumab, leucovorin, irinotecan, and fluorouracil.  To help prevent nausea and vomiting after your treatment, we encourage you to take your nausea medication as directed.   If you develop nausea and vomiting that is not controlled by your nausea medication, call the clinic.   BELOW ARE SYMPTOMS THAT SHOULD BE REPORTED IMMEDIATELY:  *FEVER GREATER THAN 100.5 F  *CHILLS WITH OR WITHOUT FEVER  NAUSEA AND VOMITING THAT IS NOT CONTROLLED WITH YOUR NAUSEA MEDICATION  *UNUSUAL SHORTNESS OF BREATH  *UNUSUAL BRUISING OR BLEEDING  TENDERNESS IN MOUTH AND THROAT WITH OR WITHOUT PRESENCE OF ULCERS  *URINARY PROBLEMS  *BOWEL PROBLEMS  UNUSUAL RASH Items with * indicate a potential emergency and should be followed up as soon as possible.  Feel free to call the clinic should you have any questions or concerns. The clinic phone number is [phone].  Please show the [facility] at check-in to the Emergency Department and triage nurse.

## 2019-10-04 NOTE — Progress Notes (Signed)
[facility] OFFICE PROGRESS NOTE   Diagnosis: Rectal cancer  INTERVAL HISTORY:   Tina Morales completed another cycle of FOLFIRI/Avastin on 09/13/2019.  She had mild nausea following chemotherapy.  The colostomy is functioning well.  No new complaint.  Objective:  Vital signs in last 24 hours:  Blood pressure 131/87, pulse (!) 114, temperature 99.1 F (37.3 C), temperature source Temporal, resp. rate 18, height _0  (1.702 m), weight 132 lb (59.9 kg), SpO2 98 %.     Resp: Lungs clear bilaterally Cardio: Regular rhythm, tachycardia GI: No hepatomegaly, left lower quadrant colostomy, nontender, no mass Vascular: No leg edema   Portacath/PICC-without erythema  Lab Results:  Lab Results  Component Value Date   WBC 5.4 10/04/2019   HGB 10.6 (L) 10/04/2019   HCT 34.4 (L) 10/04/2019   MCV 66.9 (L) 10/04/2019   PLT 307 10/04/2019   NEUTROABS 3.7 10/04/2019    CMP  Lab Results  Component Value Date   NA 133 (L) 09/13/2019   K 4.7 09/13/2019   CL 102 09/13/2019   CO2 24 09/13/2019   GLUCOSE 91 09/13/2019   BUN 7 09/13/2019   CREATININE 0.61 09/13/2019   CALCIUM 8.2 (L) 09/13/2019   PROT 5.8 (L) 09/13/2019   ALBUMIN 3.3 (L) 09/13/2019   AST 16 09/13/2019   ALT 9 09/13/2019   ALKPHOS 120 09/13/2019   BILITOT 0.3 09/13/2019   GFRNONAA >60 09/13/2019   GFRAA >60 09/13/2019    Lab Results  Component Value Date   CEA1 1.95 06/13/2019     Medications: I have reviewed the patient's current medications.   Assessment/Plan:  1. Rectal cancer-rectal mass noted on digital exam 02/10/2018, colonoscopy confirmed a him my circumferential mass in the rectum ? 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 CTs 02/10/2018-anterior rectal  wall thickening, pulmonary nodules measuring up to 9 mm concerning for metastases, few round perirectal lymph nodes ? Pelvic MRI 02/20/2018-hypermetabolic low rectal mass extending to the posterior vagina with 2 small enlarged perirectal lymph nodes, T4b,N1-2.3 cm from the anal verge ? PET scan 02/23/2018-hypermetabolic rectal mass, 8 mm hypermetabolic lingular nodule, scattered small bilateral lung nodules, some calcified, a few with mildly increased activity ? Cycle 1 FOLFOXIRI8/21/2019 ? Cycle 5 FOLFOXIRI10/16/2019 ? CTs 05/01/2018-significant interval response to therapy with decreased size of the primary rectal mass lesion. Decreasing perirectal lymphadenopathy. Decreased and/or resolved pulmonary nodules. No new sites of disease identified. ? Cycle 6 FOLFOXIRI10/31/2019 ? Radiation/Xeloda 06/12/2018-completed 07/21/2018 ? Xeloda dose reduced 07/10/2018 due to mucositis and diarrhea ? CTs 08/07/2018-compared to 02/10/2018-resolved and decreased pulmonary nodules, few tiny residual noncalcified nodules, decreased 20 perirectal lymph nodes, soft tissue of the rectum indistinguishable from posterior wall of vagina-Vaginal involvement by tumor? ? 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 CT chest 09/29/2018-enlargement of lung nodules, not a candidate for SBRT based on discussion in GI tumor conference and with radiation oncology ? CT chest 11/27/18 - interval growth of numerous pulmonary metastases bilaterally compared to 09/29/18, no new pulmonary mets ? Cycle 1FOLFOXIRI 11/29/18 ? Cycle 2 FOLFOXIRI6/10/2018 ? Cycle 3 FOLFOXIRI 12/28/2018 ? Cycle 4 FOLFIRINOX 01/11/2019 ? CTs 01/24/2019-significant improvement of bilateral pulmonary metastases. ? Cycle 1 FOLFIRI  02/01/2019 ?  Cycle 2 FOLFIRI 02/22/2019 ? Cycle 3 FOLFIRI 03/15/2019 ? Cycle 4 FOLFIRI 04/05/2019 ? Cycle 5 FOLFIRI 04/26/2019 ? CTs 05/14/2019-slight enlargement of right lung nodules, no other evidence of disease progression ? Cycle 6 FOLFIRI 05/17/2019 ? Cycle 7 FOLFIRI plus Avastin 06/13/2019 ? Cycle 8 FOLFIRI plus Avastin 07/09/2019 ? Cycle 9 FOLFIRI plus Avastin 07/25/2019 ? Cycle 10 FOLFIRI plus Avastin 08/09/2019 ? Cycle 11 FOLFIRI plus Avastin 08/22/2019 ? CTs 09/07/2019-most pulmonary nodules are stable.  1 nodule right lower lobe slightly larger.  No new lung lesions. ? Cycle 12 FOLFIRI plus Avastin 09/13/2019 ? 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 Imodium as needed. 6.Mucositis secondary to Xeloda. Improved 07/19/2018. 7. History of migraines    Disposition: Tina Morales appears stable.  She will complete another treatment with FOLFIRI/Avastin today.  She will be scheduled for restaging CTs in approximately 2 months.  We discussed the possibility of repeat treatment with oxaliplatin in the future.  She will return for an office visit and chemotherapy in 3 weeks.  Betsy Coder, MD  10/04/2019  9:59 AM

## 2019-10-05 ENCOUNTER — Telehealth: Payer: Self-pay | Admitting: Oncology

## 2019-10-05 NOTE — Telephone Encounter (Signed)
Scheduled per los. Called and spoke with patient. Confirmed appt 

## 2019-10-06 ENCOUNTER — Other Ambulatory Visit: Payer: Self-pay

## 2019-10-06 ENCOUNTER — Inpatient Hospital Stay: Payer: BC Managed Care – PPO

## 2019-10-06 VITALS — BP 124/88 | HR 80 | Temp 99.1°F | Resp 18

## 2019-10-06 DIAGNOSIS — C20 Malignant neoplasm of rectum: Secondary | ICD-10-CM

## 2019-10-06 MED ORDER — HEPARIN SOD (PORK) LOCK FLUSH 100 UNIT/ML IV SOLN
500.0000 [IU] | Freq: Once | INTRAVENOUS | Status: AC | PRN
  Administered 2019-10-06: 500 [IU]
  Filled 2019-10-06: qty 5

## 2019-10-06 MED ORDER — SODIUM CHLORIDE 0.9% FLUSH
10.0000 mL | INTRAVENOUS | Status: DC | PRN
  Administered 2019-10-06: 11:00:00 10 mL
  Filled 2019-10-06: qty 10

## 2019-10-18 ENCOUNTER — Other Ambulatory Visit: Payer: Self-pay | Admitting: *Deleted

## 2019-10-18 DIAGNOSIS — C20 Malignant neoplasm of rectum: Secondary | ICD-10-CM

## 2019-10-18 MED ORDER — ALPRAZOLAM 0.5 MG PO TABS: 0.5000 mg | ORAL_TABLET | Freq: Two times a day (BID) | ORAL | 0 refills | Status: DC | PRN

## 2019-10-18 NOTE — Progress Notes (Signed)
VM from patient requesting refill on xanax.

## 2019-10-19 NOTE — Progress Notes (Signed)

## 2019-10-21 ENCOUNTER — Other Ambulatory Visit: Payer: Self-pay | Admitting: Oncology

## 2019-10-25 ENCOUNTER — Inpatient Hospital Stay: Payer: BC Managed Care – PPO | Attending: Oncology | Admitting: Oncology

## 2019-10-25 ENCOUNTER — Inpatient Hospital Stay: Payer: BC Managed Care – PPO

## 2019-10-25 ENCOUNTER — Other Ambulatory Visit: Payer: Self-pay | Admitting: *Deleted

## 2019-10-25 ENCOUNTER — Other Ambulatory Visit: Payer: Self-pay

## 2019-10-25 VITALS — BP 115/93 | HR 107 | Temp 98.9°F | Resp 16 | Ht 67.0 in | Wt 134.1 lb

## 2019-10-25 DIAGNOSIS — C20 Malignant neoplasm of rectum: Secondary | ICD-10-CM

## 2019-10-25 DIAGNOSIS — Z95828 Presence of other vascular implants and grafts: Secondary | ICD-10-CM

## 2019-10-25 DIAGNOSIS — C7802 Secondary malignant neoplasm of left lung: Secondary | ICD-10-CM | POA: Insufficient documentation

## 2019-10-25 DIAGNOSIS — C7801 Secondary malignant neoplasm of right lung: Secondary | ICD-10-CM | POA: Diagnosis not present

## 2019-10-25 DIAGNOSIS — Z5112 Encounter for antineoplastic immunotherapy: Secondary | ICD-10-CM | POA: Insufficient documentation

## 2019-10-25 DIAGNOSIS — Z5111 Encounter for antineoplastic chemotherapy: Secondary | ICD-10-CM | POA: Diagnosis present

## 2019-10-25 LAB — CBC WITH DIFFERENTIAL (CANCER CENTER ONLY)
Abs Immature Granulocytes: 0.05 10*3/uL (ref 0.00–0.07)
Basophils Absolute: 0 10*3/uL (ref 0.0–0.1)
Basophils Relative: 0 %
Eosinophils Absolute: 0.1 10*3/uL (ref 0.0–0.5)
Eosinophils Relative: 1 %
HCT: 36.7 % (ref 36.0–46.0)
Hemoglobin: 11.3 g/dL — ABNORMAL LOW (ref 12.0–15.0)
Immature Granulocytes: 1 %
Lymphocytes Relative: 25 %
Lymphs Abs: 1.2 10*3/uL (ref 0.7–4.0)
MCH: 20.5 pg — ABNORMAL LOW (ref 26.0–34.0)
MCHC: 30.8 g/dL (ref 30.0–36.0)
MCV: 66.6 fL — ABNORMAL LOW (ref 80.0–100.0)
Monocytes Absolute: 0.6 10*3/uL (ref 0.1–1.0)
Monocytes Relative: 12 %
Neutro Abs: 3.1 10*3/uL (ref 1.7–7.7)
Neutrophils Relative %: 61 %
Platelet Count: 399 10*3/uL (ref 150–400)
RBC: 5.51 MIL/uL — ABNORMAL HIGH (ref 3.87–5.11)
RDW: 18 % — ABNORMAL HIGH (ref 11.5–15.5)
WBC Count: 5.1 10*3/uL (ref 4.0–10.5)
nRBC: 0 % (ref 0.0–0.2)

## 2019-10-25 LAB — CMP (CANCER CENTER ONLY)
ALT: 11 U/L (ref 0–44)
AST: 25 U/L (ref 15–41)
Albumin: 3.7 g/dL (ref 3.5–5.0)
Alkaline Phosphatase: 119 U/L (ref 38–126)
Anion gap: 12 (ref 5–15)
BUN: 5 mg/dL — ABNORMAL LOW (ref 6–20)
CO2: 24 mmol/L (ref 22–32)
Calcium: 9.2 mg/dL (ref 8.9–10.3)
Chloride: 97 mmol/L — ABNORMAL LOW (ref 98–111)
Creatinine: 0.62 mg/dL (ref 0.44–1.00)
GFR, Est AFR Am: >60 mL/min (ref >60)
GFR, Estimated: >60 mL/min (ref >60)
Glucose, Bld: 96 mg/dL (ref 70–99)
Potassium: 4.5 mmol/L (ref 3.5–5.1)
Sodium: 133 mmol/L — ABNORMAL LOW (ref 135–145)
Total Bilirubin: 0.4 mg/dL (ref 0.3–1.2)
Total Protein: 7 g/dL (ref 6.5–8.1)

## 2019-10-25 LAB — TOTAL PROTEIN, URINE DIPSTICK: Protein, ur: NEGATIVE mg/dL

## 2019-10-25 LAB — MAGNESIUM: Magnesium: 2 mg/dL (ref 1.7–2.4)

## 2019-10-25 MED ORDER — SODIUM CHLORIDE 0.9 % IV SOLN
Freq: Once | INTRAVENOUS | Status: AC
  Filled 2019-10-25: qty 250

## 2019-10-25 MED ORDER — ALTEPLASE 2 MG IJ SOLR
2.0000 mg | Freq: Once | INTRAMUSCULAR | Status: AC
  Administered 2019-10-25: 2 mg
  Filled 2019-10-25: qty 2

## 2019-10-25 MED ORDER — PALONOSETRON HCL INJECTION 0.25 MG/5ML
0.2500 mg | Freq: Once | INTRAVENOUS | Status: AC
  Administered 2019-10-25: 0.25 mg via INTRAVENOUS

## 2019-10-25 MED ORDER — SODIUM CHLORIDE 0.9% FLUSH
10.0000 mL | Freq: Once | INTRAVENOUS | Status: AC
  Administered 2019-10-25: 10 mL
  Filled 2019-10-25: qty 10

## 2019-10-25 MED ORDER — SODIUM CHLORIDE 0.9 % IV SOLN
3200.0000 mg/m2 | INTRAVENOUS | Status: DC
  Administered 2019-10-25: 5100 mg via INTRAVENOUS
  Filled 2019-10-25: qty 102

## 2019-10-25 MED ORDER — DEXAMETHASONE 4 MG PO TABS: ORAL_TABLET | ORAL | 0 refills | Status: DC

## 2019-10-25 MED ORDER — HEPARIN SOD (PORK) LOCK FLUSH 100 UNIT/ML IV SOLN
500.0000 [IU] | Freq: Once | INTRAVENOUS | Status: DC
  Filled 2019-10-25: qty 5

## 2019-10-25 MED ORDER — CYCLOBENZAPRINE HCL 5 MG PO TABS: 5.0000 mg | ORAL_TABLET | Freq: Two times a day (BID) | ORAL | 0 refills | Status: DC | PRN

## 2019-10-25 MED ORDER — PALONOSETRON HCL INJECTION 0.25 MG/5ML
INTRAVENOUS | Status: AC
  Filled 2019-10-25: qty 5

## 2019-10-25 MED ORDER — ALTEPLASE 2 MG IJ SOLR
INTRAMUSCULAR | Status: AC
  Filled 2019-10-25: qty 2

## 2019-10-25 MED ORDER — SODIUM CHLORIDE 0.9 % IV SOLN
150.0000 mg | Freq: Once | INTRAVENOUS | Status: AC
  Administered 2019-10-25: 150 mg via INTRAVENOUS
  Filled 2019-10-25: qty 150

## 2019-10-25 MED ORDER — LEUCOVORIN CALCIUM INJECTION 350 MG
200.0000 mg/m2 | Freq: Once | INTRAVENOUS | Status: AC
  Administered 2019-10-25: 318 mg via INTRAVENOUS
  Filled 2019-10-25: qty 15.9

## 2019-10-25 MED ORDER — SODIUM CHLORIDE 0.9 % IV SOLN
10.0000 mg | Freq: Once | INTRAVENOUS | Status: AC
  Administered 2019-10-25: 10 mg via INTRAVENOUS
  Filled 2019-10-25: qty 10

## 2019-10-25 MED ORDER — SODIUM CHLORIDE 0.9 % IV SOLN
5.0000 mg/kg | Freq: Once | INTRAVENOUS | Status: AC
  Administered 2019-10-25: 300 mg via INTRAVENOUS
  Filled 2019-10-25: qty 12

## 2019-10-25 MED ORDER — IRINOTECAN HCL CHEMO INJECTION 100 MG/5ML
150.0000 mg/m2 | Freq: Once | INTRAVENOUS | Status: AC
  Administered 2019-10-25: 240 mg via INTRAVENOUS
  Filled 2019-10-25: qty 12

## 2019-10-25 NOTE — Progress Notes (Signed)
[facility] OFFICE PROGRESS NOTE   Diagnosis: Rectal cancer  INTERVAL HISTORY:   Tina Morales completed another cycle of FOLFIRI/Avastin on 10/04/2019.  She reports nausea following chemotherapy, but no emesis.  She has alternating diarrhea and constipation.  She had mouth sores that have resolved.  No bleeding or symptom of thrombosis.  Objective:  Vital signs in last 24 hours:  Blood pressure (!) 115/93, pulse (!) 107, temperature 98.9 F (37.2 C), temperature source Temporal, resp. rate 16, height 5' 7"  (1.702 m), weight 134 lb 1.6 oz (60.8 kg), SpO2 100 %.    HEENT: No thrush or ulcers Resp: Lungs clear bilaterally Cardio: Regular rate and rhythm GI: No hepatomegaly, nontender, left lower quadrant colostomy Vascular: No leg edema  Portacath/PICC-without erythema  Lab Results:  Lab Results  Component Value Date   WBC 5.1 10/25/2019   HGB 11.3 (L) 10/25/2019   HCT 36.7 10/25/2019   MCV 66.6 (L) 10/25/2019   PLT 399 10/25/2019   NEUTROABS 3.1 10/25/2019    CMP  Lab Results  Component Value Date   NA 133 (L) 10/04/2019   K 4.5 10/04/2019   CL 101 10/04/2019   CO2 24 10/04/2019   GLUCOSE 99 10/04/2019   BUN 7 10/04/2019   CREATININE 0.61 10/04/2019   CALCIUM 8.9 10/04/2019   PROT 6.1 (L) 10/04/2019   ALBUMIN 3.5 10/04/2019   AST 17 10/04/2019   ALT 15 10/04/2019   ALKPHOS 123 10/04/2019   BILITOT 0.4 10/04/2019   GFRNONAA >60 10/04/2019   GFRAA >60 10/04/2019    Lab Results  Component Value Date   CEA1 1.95 06/13/2019     Medications: I have reviewed the patient's current medications.   Assessment/Plan:  1. Rectal cancer-rectal mass noted on digital exam 02/10/2018, colonoscopy confirmed a him my circumferential mass in the rectum ? 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 CTs 02/10/2018-anterior rectal wall thickening, pulmonary nodules measuring up to 9 mm concerning for metastases, few round perirectal lymph nodes ? Pelvic MRI 02/20/2018-hypermetabolic low rectal mass extending to the posterior vagina with 2 small enlarged perirectal lymph nodes, T4b,N1-2.3 cm from the anal verge ? PET scan 02/23/2018-hypermetabolic rectal mass, 8 mm hypermetabolic lingular nodule, scattered small bilateral lung nodules, some calcified, a few with mildly increased activity ? Cycle 1 FOLFOXIRI8/21/2019 ? Cycle 5 FOLFOXIRI10/16/2019 ? CTs 05/01/2018-significant interval response to therapy with decreased size of the primary rectal mass lesion. Decreasing perirectal lymphadenopathy. Decreased and/or resolved pulmonary nodules. No new sites of disease identified. ? Cycle 6 FOLFOXIRI10/31/2019 ? Radiation/Xeloda 06/12/2018-completed 07/21/2018 ? Xeloda dose reduced 07/10/2018 due to mucositis and diarrhea ? CTs 08/07/2018-compared to 02/10/2018-resolved and decreased pulmonary nodules, few tiny residual noncalcified nodules, decreased 20 perirectal lymph nodes, soft tissue of the rectum indistinguishable from posterior wall of vagina-Vaginal involvement by tumor? ? 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 CT chest 09/29/2018-enlargement of lung nodules, not a candidate for SBRT based on discussion in GI tumor conference and with radiation oncology ? CT chest 11/27/18 - interval growth of numerous pulmonary metastases bilaterally compared to 09/29/18, no new pulmonary mets ? Cycle 1FOLFOXIRI 11/29/18 ? Cycle 2 FOLFOXIRI6/10/2018 ? Cycle 3 FOLFOXIRI 12/28/2018 ? Cycle 4  FOLFIRINOX 01/11/2019 ? CTs  01/24/2019-significant improvement of bilateral pulmonary metastases. ? Cycle 1 FOLFIRI 02/01/2019 ? Cycle 2 FOLFIRI 02/22/2019 ? Cycle 3 FOLFIRI 03/15/2019 ? Cycle 4 FOLFIRI 04/05/2019 ? Cycle 5 FOLFIRI 04/26/2019 ? CTs 05/14/2019-slight enlargement of right lung nodules, no other evidence of disease progression ? Cycle 6 FOLFIRI 05/17/2019 ? Cycle 7 FOLFIRI plus Avastin 06/13/2019 ? Cycle 8 FOLFIRI plus Avastin 07/09/2019 ? Cycle 9 FOLFIRI plus Avastin 07/25/2019 ? Cycle 10 FOLFIRI plus Avastin 08/09/2019 ? Cycle 11 FOLFIRI plus Avastin 08/22/2019 ? CTs 09/07/2019-most pulmonary nodules are stable.  1 nodule right lower lobe slightly larger.  No new lung lesions. ? Cycle 12 FOLFIRI plus Avastin 09/13/2019 ? Cycle 13 FOLFIRI plus Avastin 10/04/2019 ? 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 Imodium as needed. 6.Mucositis secondary to Xeloda. Improved 07/19/2018. 7. History of migraines    Disposition: Tina Morales appears unchanged.  She will complete another cycle of FOLFIRI/Avastin today.  The plan is to schedule a restaging CT evaluation after cycle 16 FOLFIRI/Avastin.  She will return for an office visit and chemotherapy in 3 weeks.  Betsy Coder, MD  10/25/2019  11:36 AM

## 2019-10-25 NOTE — Patient Instructions (Signed)
[facility] Discharge Instructions for Patients Receiving Chemotherapy  Today you received the following chemotherapy agents Bevacizumab (ZIRABEV), Irinotecan (CAMPTOSAR), Leucovorin & Flourouracil (ADRUCIL).  To help prevent nausea and vomiting after your treatment, we encourage you to take your nausea medication as prescribed.   If you develop nausea and vomiting that is not controlled by your nausea medication, call the clinic.   BELOW ARE SYMPTOMS THAT SHOULD BE REPORTED IMMEDIATELY:  *FEVER GREATER THAN 100.5 F  *CHILLS WITH OR WITHOUT FEVER  NAUSEA AND VOMITING THAT IS NOT CONTROLLED WITH YOUR NAUSEA MEDICATION  *UNUSUAL SHORTNESS OF BREATH  *UNUSUAL BRUISING OR BLEEDING  TENDERNESS IN MOUTH AND THROAT WITH OR WITHOUT PRESENCE OF ULCERS  *URINARY PROBLEMS  *BOWEL PROBLEMS  UNUSUAL RASH Items with * indicate a potential emergency and should be followed up as soon as possible.  Feel free to call the clinic should you have any questions or concerns. The clinic phone number is [phone].  Please show the [facility] at check-in to the Emergency Department and triage nurse.

## 2019-10-27 ENCOUNTER — Other Ambulatory Visit: Payer: Self-pay

## 2019-10-27 ENCOUNTER — Inpatient Hospital Stay: Payer: BC Managed Care – PPO

## 2019-10-27 VITALS — BP 126/86 | HR 74 | Temp 98.9°F | Resp 18

## 2019-10-27 DIAGNOSIS — C20 Malignant neoplasm of rectum: Secondary | ICD-10-CM

## 2019-10-27 MED ORDER — SODIUM CHLORIDE 0.9% FLUSH
10.0000 mL | INTRAVENOUS | Status: DC | PRN
  Administered 2019-10-27: 13:00:00 10 mL
  Filled 2019-10-27: qty 10

## 2019-10-27 MED ORDER — HEPARIN SOD (PORK) LOCK FLUSH 100 UNIT/ML IV SOLN
500.0000 [IU] | Freq: Once | INTRAVENOUS | Status: AC | PRN
  Administered 2019-10-27: 500 [IU]
  Filled 2019-10-27: qty 5

## 2019-10-27 NOTE — Patient Instructions (Signed)

## 2019-10-30 ENCOUNTER — Telehealth: Payer: Self-pay | Admitting: Oncology

## 2019-10-30 NOTE — Telephone Encounter (Signed)
Scheduled per los. Called and left msg. Mailed printout  °

## 2019-11-08 NOTE — Progress Notes (Signed)
Pharmacist Chemotherapy Monitoring - Follow Up Assessment    I verify that I have reviewed each item in the below checklist:  . Regimen for the patient is scheduled for the appropriate day and plan matches scheduled date. Marland Kitchen Appropriate non-routine labs are ordered dependent on drug ordered. . If applicable, additional medications reviewed and ordered per protocol based on lifetime cumulative doses and/or treatment regimen.   Plan for follow-up and/or issues identified: No . I-vent associated with next due treatment: No . MD and/or nursing notified: No   Kennith Center, Pharm.D., CPP 11/08/2019@3 :34 PM

## 2019-11-11 ENCOUNTER — Other Ambulatory Visit: Payer: Self-pay | Admitting: Oncology

## 2019-11-14 ENCOUNTER — Inpatient Hospital Stay: Payer: BC Managed Care – PPO

## 2019-11-14 ENCOUNTER — Other Ambulatory Visit: Payer: Self-pay

## 2019-11-14 ENCOUNTER — Inpatient Hospital Stay: Payer: BC Managed Care – PPO | Attending: Oncology | Admitting: Oncology

## 2019-11-14 VITALS — BP 112/85 | HR 112 | Temp 98.9°F | Resp 17 | Ht 67.0 in | Wt 133.1 lb

## 2019-11-14 VITALS — HR 99

## 2019-11-14 DIAGNOSIS — C20 Malignant neoplasm of rectum: Secondary | ICD-10-CM

## 2019-11-14 DIAGNOSIS — Z5111 Encounter for antineoplastic chemotherapy: Secondary | ICD-10-CM | POA: Insufficient documentation

## 2019-11-14 DIAGNOSIS — Z5112 Encounter for antineoplastic immunotherapy: Secondary | ICD-10-CM | POA: Insufficient documentation

## 2019-11-14 DIAGNOSIS — Z95828 Presence of other vascular implants and grafts: Secondary | ICD-10-CM

## 2019-11-14 LAB — CMP (CANCER CENTER ONLY)
ALT: 11 U/L (ref 0–44)
AST: 18 U/L (ref 15–41)
Albumin: 3.6 g/dL (ref 3.5–5.0)
Alkaline Phosphatase: 126 U/L (ref 38–126)
Anion gap: 7 (ref 5–15)
BUN: 7 mg/dL (ref 6–20)
CO2: 24 mmol/L (ref 22–32)
Calcium: 9 mg/dL (ref 8.9–10.3)
Chloride: 100 mmol/L (ref 98–111)
Creatinine: 0.65 mg/dL (ref 0.44–1.00)
GFR, Est AFR Am: >60 mL/min (ref >60)
GFR, Estimated: >60 mL/min (ref >60)
Glucose, Bld: 96 mg/dL (ref 70–99)
Potassium: 5 mmol/L (ref 3.5–5.1)
Sodium: 131 mmol/L — ABNORMAL LOW (ref 135–145)
Total Bilirubin: 0.3 mg/dL (ref 0.3–1.2)
Total Protein: 6.6 g/dL (ref 6.5–8.1)

## 2019-11-14 LAB — CBC WITH DIFFERENTIAL (CANCER CENTER ONLY)
Abs Immature Granulocytes: 0.05 10*3/uL (ref 0.00–0.07)
Basophils Absolute: 0 10*3/uL (ref 0.0–0.1)
Basophils Relative: 1 %
Eosinophils Absolute: 0.1 10*3/uL (ref 0.0–0.5)
Eosinophils Relative: 2 %
HCT: 36.4 % (ref 36.0–46.0)
Hemoglobin: 11 g/dL — ABNORMAL LOW (ref 12.0–15.0)
Immature Granulocytes: 1 %
Lymphocytes Relative: 20 %
Lymphs Abs: 1.1 10*3/uL (ref 0.7–4.0)
MCH: 20 pg — ABNORMAL LOW (ref 26.0–34.0)
MCHC: 30.2 g/dL (ref 30.0–36.0)
MCV: 66.3 fL — ABNORMAL LOW (ref 80.0–100.0)
Monocytes Absolute: 0.7 10*3/uL (ref 0.1–1.0)
Monocytes Relative: 12 %
Neutro Abs: 3.8 10*3/uL (ref 1.7–7.7)
Neutrophils Relative %: 64 %
Platelet Count: 381 10*3/uL (ref 150–400)
RBC: 5.49 MIL/uL — ABNORMAL HIGH (ref 3.87–5.11)
RDW: 17.7 % — ABNORMAL HIGH (ref 11.5–15.5)
WBC Count: 5.7 10*3/uL (ref 4.0–10.5)
nRBC: 0 % (ref 0.0–0.2)

## 2019-11-14 LAB — MAGNESIUM: Magnesium: 2 mg/dL (ref 1.7–2.4)

## 2019-11-14 MED ORDER — PALONOSETRON HCL INJECTION 0.25 MG/5ML
INTRAVENOUS | Status: AC
  Filled 2019-11-14: qty 5

## 2019-11-14 MED ORDER — SODIUM CHLORIDE 0.9 % IV SOLN
5.0000 mg/kg | Freq: Once | INTRAVENOUS | Status: AC
  Administered 2019-11-14: 300 mg via INTRAVENOUS
  Filled 2019-11-14: qty 12

## 2019-11-14 MED ORDER — ALTEPLASE 2 MG IJ SOLR
2.0000 mg | Freq: Once | INTRAMUSCULAR | Status: AC
  Administered 2019-11-14: 2 mg
  Filled 2019-11-14: qty 2

## 2019-11-14 MED ORDER — ALTEPLASE 2 MG IJ SOLR
INTRAMUSCULAR | Status: AC
  Filled 2019-11-14: qty 2

## 2019-11-14 MED ORDER — SODIUM CHLORIDE 0.9% FLUSH
10.0000 mL | Freq: Once | INTRAVENOUS | Status: AC
  Administered 2019-11-14: 10 mL
  Filled 2019-11-14: qty 10

## 2019-11-14 MED ORDER — SODIUM CHLORIDE 0.9 % IV SOLN
3200.0000 mg/m2 | INTRAVENOUS | Status: DC
  Administered 2019-11-14: 5100 mg via INTRAVENOUS
  Filled 2019-11-14: qty 102

## 2019-11-14 MED ORDER — LEUCOVORIN CALCIUM INJECTION 350 MG
200.0000 mg/m2 | Freq: Once | INTRAVENOUS | Status: AC
  Administered 2019-11-14: 318 mg via INTRAVENOUS
  Filled 2019-11-14: qty 15.9

## 2019-11-14 MED ORDER — IRINOTECAN HCL CHEMO INJECTION 100 MG/5ML
150.0000 mg/m2 | Freq: Once | INTRAVENOUS | Status: AC
  Administered 2019-11-14: 240 mg via INTRAVENOUS
  Filled 2019-11-14: qty 12

## 2019-11-14 MED ORDER — SODIUM CHLORIDE 0.9 % IV SOLN
10.0000 mg | Freq: Once | INTRAVENOUS | Status: AC
  Administered 2019-11-14: 10 mg via INTRAVENOUS
  Filled 2019-11-14: qty 10

## 2019-11-14 MED ORDER — PALONOSETRON HCL INJECTION 0.25 MG/5ML
0.2500 mg | Freq: Once | INTRAVENOUS | Status: AC
  Administered 2019-11-14: 0.25 mg via INTRAVENOUS

## 2019-11-14 MED ORDER — ALPRAZOLAM 0.5 MG PO TABS: 0.5000 mg | ORAL_TABLET | Freq: Two times a day (BID) | ORAL | 0 refills | Status: DC | PRN

## 2019-11-14 MED ORDER — SODIUM CHLORIDE 0.9 % IV SOLN
Freq: Once | INTRAVENOUS | Status: AC
  Filled 2019-11-14: qty 250

## 2019-11-14 MED ORDER — SODIUM CHLORIDE 0.9 % IV SOLN
150.0000 mg | Freq: Once | INTRAVENOUS | Status: AC
  Administered 2019-11-14: 150 mg via INTRAVENOUS
  Filled 2019-11-14: qty 150

## 2019-11-14 NOTE — Progress Notes (Signed)
[facility] OFFICE PROGRESS NOTE   Diagnosis: Rectal cancer  INTERVAL HISTORY:   Tina Morales completed another cycle of FOLFIRI/Avastin on 10/25/2019.  She had nausea following chemotherapy, but no emesis.  She has intermittent diarrhea and constipation.  The diarrhea is relieved with Lomotil.  No mouth sores.  Good appetite.  Objective:  Vital signs in last 24 hours:  Blood pressure 112/85, pulse (!) 112, temperature 98.9 F (37.2 C), temperature source Temporal, resp. rate 17, height 5' 7" (1.702 m), weight 133 lb 1.6 oz (60.4 kg), SpO2 100 %.   Resp: Lungs clear bilaterally Cardio: Regular rate and rhythm GI: No hepatosplenomegaly, left lower quadrant colostomy Vascular: No leg edema  Skin: Palms without erythema  Portacath/PICC-without erythema  Lab Results:  Lab Results  Component Value Date   WBC 5.7 11/14/2019   HGB 11.0 (L) 11/14/2019   HCT 36.4 11/14/2019   MCV 66.3 (L) 11/14/2019   PLT 381 11/14/2019   NEUTROABS 3.8 11/14/2019    CMP  Lab Results  Component Value Date   NA 131 (L) 11/14/2019   K 5.0 11/14/2019   CL 100 11/14/2019   CO2 24 11/14/2019   GLUCOSE 96 11/14/2019   BUN 7 11/14/2019   CREATININE 0.65 11/14/2019   CALCIUM 9.0 11/14/2019   PROT 6.6 11/14/2019   ALBUMIN 3.6 11/14/2019   AST 18 11/14/2019   ALT 11 11/14/2019   ALKPHOS 126 11/14/2019   BILITOT 0.3 11/14/2019   GFRNONAA >60 11/14/2019   GFRAA >60 11/14/2019    Lab Results  Component Value Date   CEA1 1.95 06/13/2019     Medications: I have reviewed the patient's current medications.   Assessment/Plan:  1. Rectal cancer-rectal mass noted on digital exam 02/10/2018, colonoscopy confirmed a him my circumferential mass in the rectum ? 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 CTs 02/10/2018-anterior rectal wall thickening, pulmonary nodules measuring up to 9 mm concerning for metastases, few round perirectal lymph nodes ? Pelvic MRI 02/20/2018-hypermetabolic low rectal mass extending to the posterior vagina with 2 small enlarged perirectal lymph nodes, T4b,N1-2.3 cm from the anal verge ? PET scan 02/23/2018-hypermetabolic rectal mass, 8 mm hypermetabolic lingular nodule, scattered small bilateral lung nodules, some calcified, a few with mildly increased activity ? Cycle 1 FOLFOXIRI8/21/2019 ? Cycle 5 FOLFOXIRI10/16/2019 ? CTs 05/01/2018-significant interval response to therapy with decreased size of the primary rectal mass lesion. Decreasing perirectal lymphadenopathy. Decreased and/or resolved pulmonary nodules. No new sites of disease identified. ? Cycle 6 FOLFOXIRI10/31/2019 ? Radiation/Xeloda 06/12/2018-completed 07/21/2018 ? Xeloda dose reduced 07/10/2018 due to mucositis and diarrhea ? CTs 08/07/2018-compared to 02/10/2018-resolved and decreased pulmonary nodules, few tiny residual noncalcified nodules, decreased 20 perirectal lymph nodes, soft tissue of the rectum indistinguishable from posterior wall of vagina-Vaginal involvement by tumor? ? 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 CT chest 09/29/2018-enlargement of lung nodules, not a candidate for SBRT based on discussion in GI tumor conference and with radiation oncology ? CT chest 11/27/18 - interval growth of numerous pulmonary metastases bilaterally compared to 09/29/18, no new pulmonary mets ? Cycle 1FOLFOXIRI 11/29/18 ? Cycle 2 FOLFOXIRI6/10/2018 ? Cycle 3 FOLFOXIRI 12/28/2018 ? Cycle 4 FOLFIRINOX 01/11/2019 ? CTs 01/24/2019-significant  improvement of bilateral pulmonary metastases. ? Cycle 1 FOLFIRI 02/01/2019 ? Cycle 2 FOLFIRI 02/22/2019 ? Cycle 3 FOLFIRI 03/15/2019 ? Cycle 4 FOLFIRI 04/05/2019 ? Cycle 5 FOLFIRI 04/26/2019 ? CTs 05/14/2019-slight enlargement of right lung nodules, no other evidence of disease progression ? Cycle 6 FOLFIRI 05/17/2019 ? Cycle 7 FOLFIRI plus Avastin 06/13/2019 ? Cycle 8 FOLFIRI plus Avastin 07/09/2019 ? Cycle 9 FOLFIRI plus Avastin 07/25/2019 ? Cycle 10 FOLFIRI plus Avastin 08/09/2019 ? Cycle 11 FOLFIRI plus Avastin 08/22/2019 ? CTs 09/07/2019-most pulmonary nodules are stable.  1 nodule right lower lobe slightly larger.  No new lung lesions. ? Cycle 12 FOLFIRI plus Avastin 09/13/2019 ? Cycle 13 FOLFIRI plus Avastin 10/04/2019 ? Cycle 14 FOLFIRI plus Avastin 10/25/2019 ? 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 Imodium as needed. 6.Mucositis secondary to Xeloda. Improved 07/19/2018. 7. History of migraines     Disposition: Tina Morales appears stable.  She will complete another cycle of FOLFIRI/Avastin today.  She will return for an office visit in the next cycle of chemotherapy in 3 weeks.  She will be scheduled for restaging CTs after the next cycle.  We refilled her prescription for Xanax.  Betsy Coder, MD  11/14/2019  12:03 PM

## 2019-11-14 NOTE — Progress Notes (Signed)
CATHFLO given at 1103 by Delfin Gant, RN I drew blood peripherally.

## 2019-11-14 NOTE — Patient Instructions (Signed)

## 2019-11-16 ENCOUNTER — Inpatient Hospital Stay: Payer: BC Managed Care – PPO

## 2019-11-16 ENCOUNTER — Other Ambulatory Visit: Payer: Self-pay

## 2019-11-16 ENCOUNTER — Telehealth: Payer: Self-pay | Admitting: Oncology

## 2019-11-16 VITALS — BP 108/62 | HR 78 | Temp 98.2°F | Resp 18

## 2019-11-16 DIAGNOSIS — C20 Malignant neoplasm of rectum: Secondary | ICD-10-CM

## 2019-11-16 MED ORDER — SODIUM CHLORIDE 0.9% FLUSH
10.0000 mL | INTRAVENOUS | Status: DC | PRN
  Administered 2019-11-16: 10 mL
  Filled 2019-11-16: qty 10

## 2019-11-16 MED ORDER — HEPARIN SOD (PORK) LOCK FLUSH 100 UNIT/ML IV SOLN
500.0000 [IU] | Freq: Once | INTRAVENOUS | Status: AC | PRN
  Administered 2019-11-16: 500 [IU]
  Filled 2019-11-16: qty 5

## 2019-11-16 NOTE — Telephone Encounter (Signed)
Scheduled per los. Called and spoke with patient. Confirmed appt 

## 2019-12-02 ENCOUNTER — Other Ambulatory Visit: Payer: Self-pay | Admitting: Oncology

## 2019-12-06 ENCOUNTER — Inpatient Hospital Stay: Payer: BC Managed Care – PPO

## 2019-12-06 ENCOUNTER — Encounter: Payer: Self-pay | Admitting: Nurse Practitioner

## 2019-12-06 ENCOUNTER — Other Ambulatory Visit: Payer: Self-pay

## 2019-12-06 ENCOUNTER — Inpatient Hospital Stay (HOSPITAL_BASED_OUTPATIENT_CLINIC_OR_DEPARTMENT_OTHER): Payer: BC Managed Care – PPO | Admitting: Nurse Practitioner

## 2019-12-06 VITALS — BP 138/90 | HR 97 | Temp 98.1°F | Resp 18 | Ht 67.0 in | Wt 137.3 lb

## 2019-12-06 DIAGNOSIS — C20 Malignant neoplasm of rectum: Secondary | ICD-10-CM

## 2019-12-06 DIAGNOSIS — Z95828 Presence of other vascular implants and grafts: Secondary | ICD-10-CM

## 2019-12-06 LAB — CBC WITH DIFFERENTIAL (CANCER CENTER ONLY)
Abs Immature Granulocytes: 0.1 10*3/uL — ABNORMAL HIGH (ref 0.00–0.07)
Basophils Absolute: 0 10*3/uL (ref 0.0–0.1)
Basophils Relative: 1 %
Eosinophils Absolute: 0.2 10*3/uL (ref 0.0–0.5)
Eosinophils Relative: 4 %
HCT: 32.6 % — ABNORMAL LOW (ref 36.0–46.0)
Hemoglobin: 10 g/dL — ABNORMAL LOW (ref 12.0–15.0)
Immature Granulocytes: 2 %
Lymphocytes Relative: 21 %
Lymphs Abs: 1.2 10*3/uL (ref 0.7–4.0)
MCH: 20 pg — ABNORMAL LOW (ref 26.0–34.0)
MCHC: 30.7 g/dL (ref 30.0–36.0)
MCV: 65.2 fL — ABNORMAL LOW (ref 80.0–100.0)
Monocytes Absolute: 0.7 10*3/uL (ref 0.1–1.0)
Monocytes Relative: 12 %
Neutro Abs: 3.7 10*3/uL (ref 1.7–7.7)
Neutrophils Relative %: 60 %
Platelet Count: 358 10*3/uL (ref 150–400)
RBC: 5 MIL/uL (ref 3.87–5.11)
RDW: 17.1 % — ABNORMAL HIGH (ref 11.5–15.5)
WBC Count: 6 10*3/uL (ref 4.0–10.5)
nRBC: 0 % (ref 0.0–0.2)

## 2019-12-06 LAB — CMP (CANCER CENTER ONLY)
ALT: 11 U/L (ref 0–44)
AST: 18 U/L (ref 15–41)
Albumin: 3.3 g/dL — ABNORMAL LOW (ref 3.5–5.0)
Alkaline Phosphatase: 130 U/L — ABNORMAL HIGH (ref 38–126)
Anion gap: 8 (ref 5–15)
BUN: 9 mg/dL (ref 6–20)
CO2: 25 mmol/L (ref 22–32)
Calcium: 8.7 mg/dL — ABNORMAL LOW (ref 8.9–10.3)
Chloride: 96 mmol/L — ABNORMAL LOW (ref 98–111)
Creatinine: 0.59 mg/dL (ref 0.44–1.00)
GFR, Est AFR Am: >60 mL/min (ref >60)
GFR, Estimated: >60 mL/min (ref >60)
Glucose, Bld: 101 mg/dL — ABNORMAL HIGH (ref 70–99)
Potassium: 4.5 mmol/L (ref 3.5–5.1)
Sodium: 129 mmol/L — ABNORMAL LOW (ref 135–145)
Total Bilirubin: 0.3 mg/dL (ref 0.3–1.2)
Total Protein: 6.1 g/dL — ABNORMAL LOW (ref 6.5–8.1)

## 2019-12-06 LAB — TOTAL PROTEIN, URINE DIPSTICK: Protein, ur: NEGATIVE mg/dL

## 2019-12-06 MED ORDER — PALONOSETRON HCL INJECTION 0.25 MG/5ML
0.2500 mg | Freq: Once | INTRAVENOUS | Status: AC
  Administered 2019-12-06: 0.25 mg via INTRAVENOUS

## 2019-12-06 MED ORDER — POTASSIUM CHLORIDE CRYS ER 20 MEQ PO TBCR: 20.0000 meq | EXTENDED_RELEASE_TABLET | Freq: Two times a day (BID) | ORAL | 1 refills | Status: DC

## 2019-12-06 MED ORDER — SODIUM CHLORIDE 0.9% FLUSH
10.0000 mL | Freq: Once | INTRAVENOUS | Status: DC
  Filled 2019-12-06: qty 10

## 2019-12-06 MED ORDER — SODIUM CHLORIDE 0.9 % IV SOLN
150.0000 mg | Freq: Once | INTRAVENOUS | Status: AC
  Administered 2019-12-06: 150 mg via INTRAVENOUS
  Filled 2019-12-06: qty 150

## 2019-12-06 MED ORDER — IRINOTECAN HCL CHEMO INJECTION 100 MG/5ML
150.0000 mg/m2 | Freq: Once | INTRAVENOUS | Status: AC
  Administered 2019-12-06: 240 mg via INTRAVENOUS
  Filled 2019-12-06: qty 12

## 2019-12-06 MED ORDER — SODIUM CHLORIDE 0.9 % IV SOLN
10.0000 mg | Freq: Once | INTRAVENOUS | Status: AC
  Administered 2019-12-06: 10 mg via INTRAVENOUS
  Filled 2019-12-06: qty 10

## 2019-12-06 MED ORDER — SODIUM CHLORIDE 0.9 % IV SOLN
5.0000 mg/kg | Freq: Once | INTRAVENOUS | Status: AC
  Administered 2019-12-06: 300 mg via INTRAVENOUS
  Filled 2019-12-06: qty 12

## 2019-12-06 MED ORDER — SODIUM CHLORIDE 0.9 % IV SOLN
2000.0000 mg/m2 | INTRAVENOUS | Status: DC
  Administered 2019-12-06: 3200 mg via INTRAVENOUS
  Filled 2019-12-06: qty 64

## 2019-12-06 MED ORDER — SODIUM CHLORIDE 0.9 % IV SOLN
Freq: Once | INTRAVENOUS | Status: AC
  Filled 2019-12-06: qty 250

## 2019-12-06 MED ORDER — HEPARIN SOD (PORK) LOCK FLUSH 100 UNIT/ML IV SOLN
500.0000 [IU] | Freq: Once | INTRAVENOUS | Status: AC | PRN
  Filled 2019-12-06: qty 5

## 2019-12-06 MED ORDER — LEUCOVORIN CALCIUM INJECTION 350 MG
200.0000 mg/m2 | Freq: Once | INTRAVENOUS | Status: AC
  Administered 2019-12-06: 318 mg via INTRAVENOUS
  Filled 2019-12-06: qty 15.9

## 2019-12-06 MED ORDER — PALONOSETRON HCL INJECTION 0.25 MG/5ML
INTRAVENOUS | Status: AC
  Filled 2019-12-06: qty 5

## 2019-12-06 MED ORDER — SODIUM CHLORIDE 0.9% FLUSH
10.0000 mL | INTRAVENOUS | Status: DC | PRN
  Filled 2019-12-06: qty 10

## 2019-12-06 NOTE — Progress Notes (Signed)
[facility] OFFICE PROGRESS NOTE   Diagnosis: Rectal cancer  INTERVAL HISTORY:   Tina Morales returns as scheduled.  She completed cycle 15 FOLFIRI plus Avastin 11/14/2019.  She has intermittent nausea.  No vomiting.  No mouth sores.  Bowels alternate constipation and diarrhea.  She takes Lomotil if needed.  She has periodic migraines.  Recently the neck has been sore.  She initially thought that she slept wrong.  The discomfort varies in intensity.  She notes "muscle pain" after chemotherapy.  She has fatigue.  She reports continued pain at the perineum.  She noted anterior cervical lymph nodes were sore for a time after the most recent chemotherapy.  Objective:  Vital signs in last 24 hours:  Blood pressure 138/90, pulse 97, temperature 98.1 F (36.7 C), temperature source Temporal, resp. rate 18, height 5' 7"  (1.702 m), weight 137 lb 4.8 oz (62.3 kg), SpO2 98 %.    HEENT: No thrush or ulcers. Lymphatics: No palpable cervical lymph nodes. Resp: Lungs clear bilaterally. Cardio: Regular rate and rhythm. GI: Abdomen soft and nontender.  No hepatomegaly.  Left lower quadrant colostomy. Vascular: No leg edema. Neuro: Alert and oriented. Skin: Palms without erythema. Musculoskeletal: Neck with good range of motion.  Tender with palpation over posterior neck. Port-A-Cath without erythema.  Lab Results:  Lab Results  Component Value Date   WBC 6.0 12/06/2019   HGB 10.0 (L) 12/06/2019   HCT 32.6 (L) 12/06/2019   MCV 65.2 (L) 12/06/2019   PLT 358 12/06/2019   NEUTROABS 3.7 12/06/2019    Imaging:  No results found.  Medications: I have reviewed the patient's current medications.  Assessment/Plan: 1. Rectal cancer-rectal mass noted on digital exam 02/10/2018, colonoscopy confirmed a him my circumferential mass in the rectum ? 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 CTs 02/10/2018-anterior rectal wall thickening, pulmonary nodules measuring up to 9 mm concerning for metastases, few round perirectal lymph nodes ? Pelvic MRI 02/20/2018-hypermetabolic low rectal mass extending to the posterior vagina with 2 small enlarged perirectal lymph nodes, T4b,N1-2.3 cm from the anal verge ? PET scan 02/23/2018-hypermetabolic rectal mass, 8 mm hypermetabolic lingular nodule, scattered small bilateral lung nodules, some calcified, a few with mildly increased activity ? Cycle 1 FOLFOXIRI8/21/2019 ? Cycle 5 FOLFOXIRI10/16/2019 ? CTs 05/01/2018-significant interval response to therapy with decreased size of the primary rectal mass lesion. Decreasing perirectal lymphadenopathy. Decreased and/or resolved pulmonary nodules. No new sites of disease identified. ? Cycle 6 FOLFOXIRI10/31/2019 ? Radiation/Xeloda 06/12/2018-completed 07/21/2018 ? Xeloda dose reduced 07/10/2018 due to mucositis and diarrhea ? CTs 08/07/2018-compared to 02/10/2018-resolved and decreased pulmonary nodules, few tiny residual noncalcified nodules, decreased 20 perirectal lymph nodes, soft tissue of the rectum indistinguishable from posterior wall of vagina-Vaginal involvement by tumor? ? 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 CT chest 09/29/2018-enlargement of lung nodules, not a candidate for SBRT based on discussion in GI tumor conference and with radiation oncology ? CT chest 11/27/18 - interval growth of numerous pulmonary metastases bilaterally compared to 09/29/18, no new pulmonary mets ? Cycle 1FOLFOXIRI 11/29/18 ? Cycle 2  FOLFOXIRI6/10/2018 ? Cycle 3 FOLFOXIRI 12/28/2018 ? Cycle 4 FOLFIRINOX 01/11/2019 ? CTs 01/24/2019-significant improvement of bilateral pulmonary metastases. ?  Cycle 1 FOLFIRI 02/01/2019 ? Cycle 2 FOLFIRI 02/22/2019 ? Cycle 3 FOLFIRI 03/15/2019 ? Cycle 4 FOLFIRI 04/05/2019 ? Cycle 5 FOLFIRI 04/26/2019 ? CTs 05/14/2019-slight enlargement of right lung nodules, no other evidence of disease progression ? Cycle 6 FOLFIRI 05/17/2019 ? Cycle 7 FOLFIRI plus Avastin 06/13/2019 ? Cycle 8 FOLFIRI plus Avastin 07/09/2019 ? Cycle 9 FOLFIRI plus Avastin 07/25/2019 ? Cycle 10 FOLFIRI plus Avastin 08/09/2019 ? Cycle 11 FOLFIRI plus Avastin 08/22/2019 ? CTs 09/07/2019-most pulmonary nodules are stable.  1 nodule right lower lobe slightly larger.  No new lung lesions. ? Cycle 12 FOLFIRI plus Avastin 09/13/2019 ? Cycle 13 FOLFIRI plus Avastin 10/04/2019 ? Cycle 14 FOLFIRI plus Avastin 10/25/2019 ? Cycle 15 FOLFIRI plus Avastin 11/14/2019 ? 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 Imodium as needed. 6.Mucositis secondary to Xeloda. Improved 07/19/2018. 7. History of migraines   Disposition: Tina Morales appears stable.  She has completed 15 cycles of FOLFIRI/Avastin.  Plan to proceed with cycle 16 today as scheduled.  She will undergo restaging CTs after this cycle.  We reviewed the CBC from today.  Counts adequate to proceed with treatment.  Urine negative for protein.  Neck discomfort is likely benign musculoskeletal in origin.  We will include the neck on upcoming restaging CT scans.  She will return for follow-up in 3 weeks.  She will contact the office in the interim with any problems.  Plan reviewed with Dr. Benay Spice.  Ned Card ANP/GNP-BC   12/06/2019  10:40 AM

## 2019-12-06 NOTE — Patient Instructions (Signed)
[facility] Discharge Instructions for Patients Receiving Chemotherapy  Today you received the following chemotherapy agents: Camptosar, Leucovorin, and Adrucil  To help prevent nausea and vomiting after your treatment, we encourage you to take your nausea medication as prescribed.    If you develop nausea and vomiting that is not controlled by your nausea medication, call the clinic.   BELOW ARE SYMPTOMS THAT SHOULD BE REPORTED IMMEDIATELY:  *FEVER GREATER THAN 100.5 F  *CHILLS WITH OR WITHOUT FEVER  NAUSEA AND VOMITING THAT IS NOT CONTROLLED WITH YOUR NAUSEA MEDICATION  *UNUSUAL SHORTNESS OF BREATH  *UNUSUAL BRUISING OR BLEEDING  TENDERNESS IN MOUTH AND THROAT WITH OR WITHOUT PRESENCE OF ULCERS  *URINARY PROBLEMS  *BOWEL PROBLEMS  UNUSUAL RASH Items with * indicate a potential emergency and should be followed up as soon as possible.  Feel free to call the clinic should you have any questions or concerns. The clinic phone number is [phone].  Please show the [place] at check-in to the Emergency Department and triage nurse.

## 2019-12-08 ENCOUNTER — Inpatient Hospital Stay: Payer: BC Managed Care – PPO

## 2019-12-08 ENCOUNTER — Other Ambulatory Visit: Payer: Self-pay

## 2019-12-08 VITALS — BP 129/86 | HR 112 | Temp 97.4°F | Resp 18

## 2019-12-08 DIAGNOSIS — C20 Malignant neoplasm of rectum: Secondary | ICD-10-CM

## 2019-12-08 MED ORDER — HEPARIN SOD (PORK) LOCK FLUSH 100 UNIT/ML IV SOLN
500.0000 [IU] | Freq: Once | INTRAVENOUS | Status: AC | PRN
  Administered 2019-12-08: 500 [IU]
  Filled 2019-12-08: qty 5

## 2019-12-08 MED ORDER — SODIUM CHLORIDE 0.9% FLUSH
10.0000 mL | INTRAVENOUS | Status: DC | PRN
  Administered 2019-12-08: 10 mL
  Filled 2019-12-08: qty 10

## 2019-12-08 NOTE — Patient Instructions (Signed)

## 2019-12-18 ENCOUNTER — Other Ambulatory Visit: Payer: Self-pay | Admitting: *Deleted

## 2019-12-18 DIAGNOSIS — C20 Malignant neoplasm of rectum: Secondary | ICD-10-CM

## 2019-12-18 MED ORDER — ALPRAZOLAM 0.5 MG PO TABS: 0.5000 mg | ORAL_TABLET | Freq: Two times a day (BID) | ORAL | 0 refills | Status: DC | PRN

## 2019-12-18 NOTE — Progress Notes (Signed)
Patient left message requesting refill on alprazolam.

## 2019-12-21 ENCOUNTER — Other Ambulatory Visit: Payer: Self-pay

## 2019-12-21 ENCOUNTER — Ambulatory Visit (HOSPITAL_COMMUNITY)
Admission: RE | Admit: 2019-12-21 | Discharge: 2019-12-21 | Disposition: A | Discharge status: Home / Self Care | Payer: BC Managed Care – PPO | Source: Ambulatory Visit | Attending: Nurse Practitioner | Admitting: Nurse Practitioner

## 2019-12-21 DIAGNOSIS — C20 Malignant neoplasm of rectum: Secondary | ICD-10-CM | POA: Diagnosis not present

## 2019-12-21 MED ORDER — HEPARIN SOD (PORK) LOCK FLUSH 100 UNIT/ML IV SOLN
INTRAVENOUS | Status: AC
  Filled 2019-12-21: qty 5

## 2019-12-21 MED ORDER — IOHEXOL 300 MG/ML  SOLN
100.0000 mL | Freq: Once | INTRAMUSCULAR | Status: AC | PRN
  Administered 2019-12-21: 100 mL via INTRAVENOUS

## 2019-12-21 MED ORDER — SODIUM CHLORIDE (PF) 0.9 % IJ SOLN
INTRAMUSCULAR | Status: AC
  Filled 2019-12-21: qty 50

## 2019-12-23 ENCOUNTER — Other Ambulatory Visit: Payer: Self-pay | Admitting: Oncology

## 2019-12-24 ENCOUNTER — Telehealth: Payer: Self-pay | Admitting: Nurse Practitioner

## 2019-12-24 NOTE — Telephone Encounter (Signed)
Called to confirm tx scheduled appt on 6/17 - unable to reach pt . Left message for patient that appt has been scheduled.

## 2019-12-27 ENCOUNTER — Inpatient Hospital Stay: Payer: BC Managed Care – PPO | Attending: Oncology | Admitting: Nurse Practitioner

## 2019-12-27 ENCOUNTER — Inpatient Hospital Stay: Payer: BC Managed Care – PPO

## 2019-12-27 ENCOUNTER — Encounter: Payer: Self-pay | Admitting: Nurse Practitioner

## 2019-12-27 ENCOUNTER — Other Ambulatory Visit: Payer: Self-pay

## 2019-12-27 VITALS — BP 114/85 | HR 107 | Temp 98.2°F | Resp 17 | Ht 67.0 in | Wt 137.3 lb

## 2019-12-27 DIAGNOSIS — Z5112 Encounter for antineoplastic immunotherapy: Secondary | ICD-10-CM | POA: Diagnosis present

## 2019-12-27 DIAGNOSIS — C20 Malignant neoplasm of rectum: Secondary | ICD-10-CM | POA: Insufficient documentation

## 2019-12-27 DIAGNOSIS — C7802 Secondary malignant neoplasm of left lung: Secondary | ICD-10-CM | POA: Insufficient documentation

## 2019-12-27 DIAGNOSIS — C7801 Secondary malignant neoplasm of right lung: Secondary | ICD-10-CM | POA: Insufficient documentation

## 2019-12-27 DIAGNOSIS — Z5111 Encounter for antineoplastic chemotherapy: Secondary | ICD-10-CM | POA: Insufficient documentation

## 2019-12-27 DIAGNOSIS — Z95828 Presence of other vascular implants and grafts: Secondary | ICD-10-CM

## 2019-12-27 LAB — CMP (CANCER CENTER ONLY)
ALT: 9 U/L (ref 0–44)
AST: 16 U/L (ref 15–41)
Albumin: 3.5 g/dL (ref 3.5–5.0)
Alkaline Phosphatase: 114 U/L (ref 38–126)
Anion gap: 8 (ref 5–15)
BUN: 9 mg/dL (ref 6–20)
CO2: 24 mmol/L (ref 22–32)
Calcium: 8.9 mg/dL (ref 8.9–10.3)
Chloride: 98 mmol/L (ref 98–111)
Creatinine: 0.59 mg/dL (ref 0.44–1.00)
GFR, Est AFR Am: >60 mL/min (ref >60)
GFR, Estimated: >60 mL/min (ref >60)
Glucose, Bld: 104 mg/dL — ABNORMAL HIGH (ref 70–99)
Potassium: 4.4 mmol/L (ref 3.5–5.1)
Sodium: 130 mmol/L — ABNORMAL LOW (ref 135–145)
Total Bilirubin: 0.3 mg/dL (ref 0.3–1.2)
Total Protein: 6.3 g/dL — ABNORMAL LOW (ref 6.5–8.1)

## 2019-12-27 LAB — CBC WITH DIFFERENTIAL (CANCER CENTER ONLY)
Abs Immature Granulocytes: 0.05 10*3/uL (ref 0.00–0.07)
Basophils Absolute: 0 10*3/uL (ref 0.0–0.1)
Basophils Relative: 1 %
Eosinophils Absolute: 0.1 10*3/uL (ref 0.0–0.5)
Eosinophils Relative: 2 %
HCT: 33.8 % — ABNORMAL LOW (ref 36.0–46.0)
Hemoglobin: 10.5 g/dL — ABNORMAL LOW (ref 12.0–15.0)
Immature Granulocytes: 1 %
Lymphocytes Relative: 19 %
Lymphs Abs: 1 10*3/uL (ref 0.7–4.0)
MCH: 20.7 pg — ABNORMAL LOW (ref 26.0–34.0)
MCHC: 31.1 g/dL (ref 30.0–36.0)
MCV: 66.5 fL — ABNORMAL LOW (ref 80.0–100.0)
Monocytes Absolute: 0.6 10*3/uL (ref 0.1–1.0)
Monocytes Relative: 13 %
Neutro Abs: 3.2 10*3/uL (ref 1.7–7.7)
Neutrophils Relative %: 64 %
Platelet Count: 352 10*3/uL (ref 150–400)
RBC: 5.08 MIL/uL (ref 3.87–5.11)
RDW: 17.4 % — ABNORMAL HIGH (ref 11.5–15.5)
WBC Count: 4.9 10*3/uL (ref 4.0–10.5)
nRBC: 0 % (ref 0.0–0.2)

## 2019-12-27 LAB — MAGNESIUM: Magnesium: 2 mg/dL (ref 1.7–2.4)

## 2019-12-27 MED ORDER — SODIUM CHLORIDE 0.9 % IV SOLN
10.0000 mg | Freq: Once | INTRAVENOUS | Status: AC
  Administered 2019-12-27: 10 mg via INTRAVENOUS
  Filled 2019-12-27: qty 10

## 2019-12-27 MED ORDER — SODIUM CHLORIDE 0.9% FLUSH
10.0000 mL | Freq: Once | INTRAVENOUS | Status: AC
  Administered 2019-12-27: 10 mL
  Filled 2019-12-27: qty 10

## 2019-12-27 MED ORDER — SODIUM CHLORIDE 0.9 % IV SOLN
Freq: Once | INTRAVENOUS | Status: AC
  Filled 2019-12-27: qty 250

## 2019-12-27 MED ORDER — SODIUM CHLORIDE 0.9 % IV SOLN
2000.0000 mg/m2 | INTRAVENOUS | Status: DC
  Administered 2019-12-27: 3200 mg via INTRAVENOUS
  Filled 2019-12-27: qty 64

## 2019-12-27 MED ORDER — PALONOSETRON HCL INJECTION 0.25 MG/5ML
INTRAVENOUS | Status: AC
  Filled 2019-12-27: qty 5

## 2019-12-27 MED ORDER — OXALIPLATIN CHEMO INJECTION 100 MG/20ML
85.0000 mg/m2 | Freq: Once | INTRAVENOUS | Status: AC
  Administered 2019-12-27: 135 mg via INTRAVENOUS
  Filled 2019-12-27: qty 20

## 2019-12-27 MED ORDER — SODIUM CHLORIDE 0.9 % IV SOLN
150.0000 mg | Freq: Once | INTRAVENOUS | Status: AC
  Administered 2019-12-27: 150 mg via INTRAVENOUS
  Filled 2019-12-27: qty 150

## 2019-12-27 MED ORDER — LEUCOVORIN CALCIUM INJECTION 350 MG
200.0000 mg/m2 | Freq: Once | INTRAVENOUS | Status: AC
  Administered 2019-12-27: 318 mg via INTRAVENOUS
  Filled 2019-12-27: qty 15.9

## 2019-12-27 MED ORDER — DEXTROSE 5 % IV SOLN
INTRAVENOUS | Status: DC
  Filled 2019-12-27: qty 250

## 2019-12-27 MED ORDER — PALONOSETRON HCL INJECTION 0.25 MG/5ML
0.2500 mg | Freq: Once | INTRAVENOUS | Status: AC
  Administered 2019-12-27: 0.25 mg via INTRAVENOUS

## 2019-12-27 MED ORDER — SODIUM CHLORIDE 0.9 % IV SOLN
5.0000 mg/kg | Freq: Once | INTRAVENOUS | Status: AC
  Administered 2019-12-27: 300 mg via INTRAVENOUS
  Filled 2019-12-27: qty 12

## 2019-12-27 NOTE — Patient Instructions (Signed)
[facility] Discharge Instructions for Patients Receiving Chemotherapy  Today you received the following chemotherapy agents: Bevacizumab-bvzr Noah Charon), Oxaliplatin (Eloxatin), Leucovorin, and Fluorouracil (Adrucil, 5-FU)  To help prevent nausea and vomiting after your treatment, we encourage you to take your nausea medication as directed by your provider.   If you develop nausea and vomiting that is not controlled by your nausea medication, call the clinic.   BELOW ARE SYMPTOMS THAT SHOULD BE REPORTED IMMEDIATELY:  *FEVER GREATER THAN 100.5 F  *CHILLS WITH OR WITHOUT FEVER  NAUSEA AND VOMITING THAT IS NOT CONTROLLED WITH YOUR NAUSEA MEDICATION  *UNUSUAL SHORTNESS OF BREATH  *UNUSUAL BRUISING OR BLEEDING  TENDERNESS IN MOUTH AND THROAT WITH OR WITHOUT PRESENCE OF ULCERS  *URINARY PROBLEMS  *BOWEL PROBLEMS  UNUSUAL RASH Items with * indicate a potential emergency and should be followed up as soon as possible.  Feel free to call the clinic should you have any questions or concerns. The clinic phone number is [phone].  Please show the [place] at check-in to the Emergency Department and triage nurse.

## 2019-12-27 NOTE — Progress Notes (Signed)
Per [name] OK to treat with elevated HR

## 2019-12-27 NOTE — Progress Notes (Addendum)
[facility] OFFICE PROGRESS NOTE   Diagnosis: Rectal cancer  INTERVAL HISTORY:   Tina Morales returns as scheduled.  She completed cycle 16 FOLFIRI plus Avastin 12/06/2019.  She has intermittent nausea.  Bowels alternate constipation and diarrhea.  She had some mouth sores.  Continued fatigue.  She reports developing "red spots" on her arms during the last chemotherapy infusion.  She is unsure which medication was infusing at that time.  No associated pruritus.  She has mild neuropathy symptoms in the hands and feet, no interference with activity.  Objective:  Vital signs in last 24 hours:  Blood pressure 114/85, pulse (!) 107, temperature 98.2 F (36.8 C), temperature source Oral, resp. rate 17, height _0  (1.702 m), weight 137 lb 4.8 oz (62.3 kg), SpO2 100 %.    HEENT: No thrush or ulcers. Resp: Lungs clear bilaterally. Cardio: Regular rate and rhythm. GI: Abdomen soft and nontender.  No hepatomegaly.  Left lower quadrant colostomy. Vascular: No leg edema.  Skin: No rash. Port-A-Cath without erythema.   Lab Results:  Lab Results  Component Value Date   WBC 4.9 12/27/2019   HGB 10.5 (L) 12/27/2019   HCT 33.8 (L) 12/27/2019   MCV 66.5 (L) 12/27/2019   PLT 352 12/27/2019   NEUTROABS 3.2 12/27/2019    Imaging:  No results found.  Medications: I have reviewed the patient's current medications.  Assessment/Plan: 1. Rectal cancer-rectal mass noted on digital exam 02/10/2018, colonoscopy confirmed a him my circumferential mass in the rectum ? 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 CTs 02/10/2018-anterior rectal wall thickening, pulmonary nodules measuring up to 9 mm concerning for metastases, few round perirectal lymph  nodes ? Pelvic MRI 02/20/2018-hypermetabolic low rectal mass extending to the posterior vagina with 2 small enlarged perirectal lymph nodes, T4b,N1-2.3 cm from the anal verge ? PET scan 02/23/2018-hypermetabolic rectal mass, 8 mm hypermetabolic lingular nodule, scattered small bilateral lung nodules, some calcified, a few with mildly increased activity ? Cycle 1 FOLFOXIRI8/21/2019 ? Cycle 5 FOLFOXIRI10/16/2019 ? CTs 05/01/2018-significant interval response to therapy with decreased size of the primary rectal mass lesion. Decreasing perirectal lymphadenopathy. Decreased and/or resolved pulmonary nodules. No new sites of disease identified. ? Cycle 6 FOLFOXIRI10/31/2019 ? Radiation/Xeloda 06/12/2018-completed 07/21/2018 ? Xeloda dose reduced 07/10/2018 due to mucositis and diarrhea ? CTs 08/07/2018-compared to 02/10/2018-resolved and decreased pulmonary nodules, few tiny residual noncalcified nodules, decreased 20 perirectal lymph nodes, soft tissue of the rectum indistinguishable from posterior wall of vagina-Vaginal involvement by tumor? ? 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 CT chest 09/29/2018-enlargement of lung nodules, not a candidate for SBRT based on discussion in GI tumor conference and with radiation oncology ? CT chest 11/27/18 - interval growth of numerous pulmonary metastases bilaterally compared to 09/29/18, no new pulmonary mets ? Cycle 1FOLFOXIRI 11/29/18 ? Cycle 2 FOLFOXIRI6/10/2018 ? Cycle 3 FOLFOXIRI 12/28/2018 ? Cycle 4 FOLFIRINOX 01/11/2019 ? CTs 01/24/2019-significant improvement of bilateral pulmonary metastases. ? Cycle 1 FOLFIRI 02/01/2019 ? Cycle 2 FOLFIRI 02/22/2019 ? Cycle 3 FOLFIRI 03/15/2019 ? Cycle 4 FOLFIRI 04/05/2019 ? Cycle 5 FOLFIRI  04/26/2019 ? CTs 05/14/2019-slight enlargement of right lung nodules, no other evidence of disease progression ? Cycle 6 FOLFIRI 05/17/2019 ?  Cycle 7 FOLFIRI plus Avastin 06/13/2019 ? Cycle 8 FOLFIRI plus Avastin 07/09/2019 ? Cycle 9 FOLFIRI plus Avastin 07/25/2019 ? Cycle 10 FOLFIRI plus Avastin 08/09/2019 ? Cycle 11 FOLFIRI plus Avastin 08/22/2019 ? CTs 09/07/2019-most pulmonary nodules are stable. 1 nodule right lower lobe slightly larger. No new lung lesions. ? Cycle 12 FOLFIRI plus Avastin 09/13/2019 ? Cycle 13 FOLFIRI plus Avastin 10/04/2019 ? Cycle 14 FOLFIRI plus Avastin 10/25/2019 ? Cycle 15 FOLFIRI plus Avastin 11/14/2019 ? Cycle 16 FOLFIRI plus Avastin 12/06/2019 ? CTs neck, chest, abdomen, pelvis 12/21/2019-neck negative; mildly progressive pulmonary metastases; abdomen and pelvis negative for evidence of metastatic disease. ? 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 Imodium as needed. 6.Mucositis secondary to Xeloda. Improved 07/19/2018. 7. History of migraines   Disposition: Tina Morales appears unchanged.  She has completed 16 cycles of FOLFIRI/Avastin.  Recent restaging CTs show mild progression of lung metastases.  Results and images reviewed with her at today's appointment.  Dr. Benay Spice recommends changing treatment to FOLFOX/Avastin.  She understands the chance of an allergic reaction when retreated with oxaliplatin.  She also understands current neuropathy symptoms could worsen.  She agrees to proceed.  Plan to proceed with cycle 1 FOLFOX/Avastin today.  She will return for lab, follow-up, cycle 2 FOLFOX/Avastin in 3 weeks.  Patient seen with Dr. Benay Spice.    Ned Card ANP/GNP-BC   12/27/2019  9:23 AM This was a shared visit with Ned Card.  We reviewed  the CT images with Tina Morales and discussed the CT findings.  There is evidence of progressive disease in the lungs.  We reviewed treatment options including a treatment break, continuing FOLFIRI/Avastin, FOLFOX/Avastin, and referring her for clinical trial.  She has not failed oxaliplatin-based therapy.  She has significant malaise and GI symptoms following irinotecan.  I recommend beginning FOLFOX/Avastin.  She would like to continue treatment on a 3-week schedule.  We reviewed toxicities associated with this regimen including the chance of an allergic reaction and progressive neuropathy.  Julieanne Manson, MD

## 2019-12-29 ENCOUNTER — Other Ambulatory Visit: Payer: Self-pay

## 2019-12-29 ENCOUNTER — Inpatient Hospital Stay: Payer: BC Managed Care – PPO

## 2019-12-29 VITALS — BP 132/93 | HR 81 | Temp 99.1°F | Resp 20

## 2019-12-29 DIAGNOSIS — C20 Malignant neoplasm of rectum: Secondary | ICD-10-CM

## 2019-12-29 MED ORDER — HEPARIN SOD (PORK) LOCK FLUSH 100 UNIT/ML IV SOLN
500.0000 [IU] | Freq: Once | INTRAVENOUS | Status: AC | PRN
  Administered 2019-12-29: 500 [IU]
  Filled 2019-12-29: qty 5

## 2019-12-29 MED ORDER — SODIUM CHLORIDE 0.9% FLUSH
10.0000 mL | INTRAVENOUS | Status: DC | PRN
  Administered 2019-12-29: 10 mL
  Filled 2019-12-29: qty 10

## 2020-01-04 ENCOUNTER — Telehealth: Payer: Self-pay | Admitting: Oncology

## 2020-01-04 NOTE — Telephone Encounter (Signed)
Called and spoke with patient. Confirmed all appts  °

## 2020-01-14 ENCOUNTER — Other Ambulatory Visit: Payer: Self-pay | Admitting: Oncology

## 2020-01-17 ENCOUNTER — Other Ambulatory Visit: Payer: BC Managed Care – PPO

## 2020-01-17 ENCOUNTER — Inpatient Hospital Stay (HOSPITAL_BASED_OUTPATIENT_CLINIC_OR_DEPARTMENT_OTHER): Payer: BC Managed Care – PPO | Admitting: Nurse Practitioner

## 2020-01-17 ENCOUNTER — Inpatient Hospital Stay: Payer: BC Managed Care – PPO

## 2020-01-17 ENCOUNTER — Ambulatory Visit: Payer: BC Managed Care – PPO | Admitting: Nurse Practitioner

## 2020-01-17 ENCOUNTER — Encounter: Payer: Self-pay | Admitting: Nurse Practitioner

## 2020-01-17 ENCOUNTER — Inpatient Hospital Stay: Payer: BC Managed Care – PPO | Attending: Oncology

## 2020-01-17 ENCOUNTER — Other Ambulatory Visit: Payer: Self-pay

## 2020-01-17 VITALS — HR 91

## 2020-01-17 DIAGNOSIS — C7802 Secondary malignant neoplasm of left lung: Secondary | ICD-10-CM | POA: Diagnosis not present

## 2020-01-17 DIAGNOSIS — C20 Malignant neoplasm of rectum: Secondary | ICD-10-CM | POA: Diagnosis not present

## 2020-01-17 DIAGNOSIS — Z5112 Encounter for antineoplastic immunotherapy: Secondary | ICD-10-CM | POA: Insufficient documentation

## 2020-01-17 DIAGNOSIS — C7801 Secondary malignant neoplasm of right lung: Secondary | ICD-10-CM | POA: Diagnosis not present

## 2020-01-17 DIAGNOSIS — Z5111 Encounter for antineoplastic chemotherapy: Secondary | ICD-10-CM | POA: Diagnosis present

## 2020-01-17 LAB — CMP (CANCER CENTER ONLY)
ALT: 17 U/L (ref 0–44)
AST: 21 U/L (ref 15–41)
Albumin: 3.5 g/dL (ref 3.5–5.0)
Alkaline Phosphatase: 120 U/L (ref 38–126)
Anion gap: 10 (ref 5–15)
BUN: 8 mg/dL (ref 6–20)
CO2: 22 mmol/L (ref 22–32)
Calcium: 8.9 mg/dL (ref 8.9–10.3)
Chloride: 101 mmol/L (ref 98–111)
Creatinine: 0.65 mg/dL (ref 0.44–1.00)
GFR, Est AFR Am: >60 mL/min (ref >60)
GFR, Estimated: >60 mL/min (ref >60)
Glucose, Bld: 75 mg/dL (ref 70–99)
Potassium: 4.5 mmol/L (ref 3.5–5.1)
Sodium: 133 mmol/L — ABNORMAL LOW (ref 135–145)
Total Bilirubin: 0.3 mg/dL (ref 0.3–1.2)
Total Protein: 6.4 g/dL — ABNORMAL LOW (ref 6.5–8.1)

## 2020-01-17 LAB — CBC WITH DIFFERENTIAL (CANCER CENTER ONLY)
Abs Immature Granulocytes: 0.09 10*3/uL — ABNORMAL HIGH (ref 0.00–0.07)
Basophils Absolute: 0 10*3/uL (ref 0.0–0.1)
Basophils Relative: 0 %
Eosinophils Absolute: 0.1 10*3/uL (ref 0.0–0.5)
Eosinophils Relative: 2 %
HCT: 34.3 % — ABNORMAL LOW (ref 36.0–46.0)
Hemoglobin: 10.3 g/dL — ABNORMAL LOW (ref 12.0–15.0)
Immature Granulocytes: 1 %
Lymphocytes Relative: 20 %
Lymphs Abs: 1.4 10*3/uL (ref 0.7–4.0)
MCH: 19.6 pg — ABNORMAL LOW (ref 26.0–34.0)
MCHC: 30 g/dL (ref 30.0–36.0)
MCV: 65.2 fL — ABNORMAL LOW (ref 80.0–100.0)
Monocytes Absolute: 0.8 10*3/uL (ref 0.1–1.0)
Monocytes Relative: 12 %
Neutro Abs: 4.4 10*3/uL (ref 1.7–7.7)
Neutrophils Relative %: 65 %
Platelet Count: 273 10*3/uL (ref 150–400)
RBC: 5.26 MIL/uL — ABNORMAL HIGH (ref 3.87–5.11)
RDW: 17.3 % — ABNORMAL HIGH (ref 11.5–15.5)
WBC Count: 6.8 10*3/uL (ref 4.0–10.5)
nRBC: 0.3 % — ABNORMAL HIGH (ref 0.0–0.2)

## 2020-01-17 LAB — TOTAL PROTEIN, URINE DIPSTICK: Protein, ur: NEGATIVE mg/dL

## 2020-01-17 MED ORDER — SODIUM CHLORIDE 0.9% FLUSH
10.0000 mL | INTRAVENOUS | Status: DC | PRN
  Filled 2020-01-17: qty 10

## 2020-01-17 MED ORDER — PALONOSETRON HCL INJECTION 0.25 MG/5ML
0.2500 mg | Freq: Once | INTRAVENOUS | Status: AC
  Administered 2020-01-17: 0.25 mg via INTRAVENOUS

## 2020-01-17 MED ORDER — SODIUM CHLORIDE 0.9 % IV SOLN
Freq: Once | INTRAVENOUS | Status: AC
  Filled 2020-01-17: qty 250

## 2020-01-17 MED ORDER — SODIUM CHLORIDE 0.9 % IV SOLN
150.0000 mg | Freq: Once | INTRAVENOUS | Status: AC
  Administered 2020-01-17: 150 mg via INTRAVENOUS
  Filled 2020-01-17: qty 150

## 2020-01-17 MED ORDER — LEUCOVORIN CALCIUM INJECTION 350 MG
200.0000 mg/m2 | Freq: Once | INTRAVENOUS | Status: AC
  Administered 2020-01-17: 318 mg via INTRAVENOUS
  Filled 2020-01-17: qty 15.9

## 2020-01-17 MED ORDER — SODIUM CHLORIDE 0.9 % IV SOLN
2000.0000 mg/m2 | INTRAVENOUS | Status: DC
  Administered 2020-01-17: 3200 mg via INTRAVENOUS
  Filled 2020-01-17: qty 64

## 2020-01-17 MED ORDER — SODIUM CHLORIDE 0.9 % IV SOLN
5.0000 mg/kg | Freq: Once | INTRAVENOUS | Status: AC
  Administered 2020-01-17: 300 mg via INTRAVENOUS
  Filled 2020-01-17: qty 12

## 2020-01-17 MED ORDER — ALPRAZOLAM 0.5 MG PO TABS: 0.5000 mg | ORAL_TABLET | Freq: Two times a day (BID) | ORAL | 0 refills | Status: DC | PRN

## 2020-01-17 MED ORDER — PALONOSETRON HCL INJECTION 0.25 MG/5ML
INTRAVENOUS | Status: AC
  Filled 2020-01-17: qty 5

## 2020-01-17 MED ORDER — OXALIPLATIN CHEMO INJECTION 100 MG/20ML
85.0000 mg/m2 | Freq: Once | INTRAVENOUS | Status: AC
  Administered 2020-01-17: 135 mg via INTRAVENOUS
  Filled 2020-01-17: qty 20

## 2020-01-17 MED ORDER — DEXTROSE 5 % IV SOLN
INTRAVENOUS | Status: DC
  Filled 2020-01-17: qty 250

## 2020-01-17 MED ORDER — SODIUM CHLORIDE 0.9 % IV SOLN
10.0000 mg | Freq: Once | INTRAVENOUS | Status: AC
  Administered 2020-01-17: 10 mg via INTRAVENOUS
  Filled 2020-01-17: qty 10

## 2020-01-17 NOTE — Progress Notes (Signed)
[facility] Morales OFFICE PROGRESS NOTE   Diagnosis: Rectal cancer  INTERVAL HISTORY:   Tina Morales returns as scheduled.  She completed cycle 1 FOLFOX plus Avastin 12/27/2019.  She denies nausea/vomiting.  No mouth sores.  She tends to develop constipation after chemotherapy then diarrhea.  No hand or foot pain or redness.  She denies bleeding.  She continues to note pain at the perineal scar after each chemotherapy session.  She also notices burning with urination as well as a burning sensation over the labia with urination after each chemotherapy.  Stable neuropathy symptoms in the feet.  No numbness or tingling in the hands.  Objective:  Vital signs in last 24 hours:  Blood pressure 130/78, pulse (!) 105, temperature 98.1 F (36.7 C), temperature source Temporal, resp. rate 17, height 5' 7"  (1.702 m), weight 138 lb 8 oz (62.8 kg), SpO2 100 %.    HEENT: No thrush or ulcers. Resp: Lungs clear bilaterally. Cardio: Regular rate and rhythm. GI: Abdomen soft and nontender.  No hepatomegaly.  Left lower quadrant colostomy. Vascular: No leg edema.  Skin: Palms without erythema.  Perineal scar is unremarkable, no nodularity or erythema.  Labia without erythema.  Area of ulceration base of the labia/vagina on the left.   Lab Results:  Lab Results  Component Value Date   WBC 6.8 01/17/2020   HGB 10.3 (L) 01/17/2020   HCT 34.3 (L) 01/17/2020   MCV 65.2 (L) 01/17/2020   PLT 273 01/17/2020   NEUTROABS 4.4 01/17/2020    Imaging:  No results found.  Medications: I have reviewed the patient's current medications.  Assessment/Plan: 1. Rectal cancer-rectal mass noted on digital exam 02/10/2018, colonoscopy confirmed a him my circumferential mass in the rectum ? 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 CTs 02/10/2018-anterior rectal wall thickening, pulmonary nodules measuring up to 9 mm concerning for metastases, few round perirectal lymph nodes ? Pelvic MRI 02/20/2018-hypermetabolic low rectal mass extending to the posterior vagina with 2 small enlarged perirectal lymph nodes, T4b,N1-2.3 cm from the anal verge ? PET scan 02/23/2018-hypermetabolic rectal mass, 8 mm hypermetabolic lingular nodule, scattered small bilateral lung nodules, some calcified, a few with mildly increased activity ? Cycle 1 FOLFOXIRI8/21/2019 ? Cycle 5 FOLFOXIRI10/16/2019 ? CTs 05/01/2018-significant interval response to therapy with decreased size of the primary rectal mass lesion. Decreasing perirectal lymphadenopathy. Decreased and/or resolved pulmonary nodules. No new sites of disease identified. ? Cycle 6 FOLFOXIRI10/31/2019 ? Radiation/Xeloda 06/12/2018-completed 07/21/2018 ? Xeloda dose reduced 07/10/2018 due to mucositis and diarrhea ? CTs 08/07/2018-compared to 02/10/2018-resolved and decreased pulmonary nodules, few tiny residual noncalcified nodules, decreased 20 perirectal lymph nodes, soft tissue of the rectum indistinguishable from posterior wall of vagina-Vaginal involvement by tumor? ? 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 CT chest 09/29/2018-enlargement of lung nodules, not a candidate for SBRT based on discussion in GI tumor conference and with radiation oncology ? CT chest 11/27/18 - interval growth of numerous pulmonary metastases bilaterally compared to 09/29/18, no new pulmonary mets ? Cycle 1FOLFOXIRI 11/29/18 ? Cycle 2 FOLFOXIRI6/10/2018 ? Cycle 3 FOLFOXIRI 12/28/2018 ? Cycle 4 FOLFIRINOX 01/11/2019 ? CTs  01/24/2019-significant improvement of bilateral pulmonary metastases. ? Cycle 1 FOLFIRI 02/01/2019 ? Cycle  2 FOLFIRI 02/22/2019 ? Cycle 3 FOLFIRI 03/15/2019 ? Cycle 4 FOLFIRI 04/05/2019 ? Cycle 5 FOLFIRI 04/26/2019 ? CTs 05/14/2019-slight enlargement of right lung nodules, no other evidence of disease progression ? Cycle 6 FOLFIRI 05/17/2019 ? Cycle 7 FOLFIRI plus Avastin 06/13/2019 ? Cycle 8 FOLFIRI plus Avastin 07/09/2019 ? Cycle 9 FOLFIRI plus Avastin 07/25/2019 ? Cycle 10 FOLFIRI plus Avastin 08/09/2019 ? Cycle 11 FOLFIRI plus Avastin 08/22/2019 ? CTs 09/07/2019-most pulmonary nodules are stable. 1 nodule right lower lobe slightly larger. No new lung lesions. ? Cycle 12 FOLFIRI plus Avastin 09/13/2019 ? Cycle 13 FOLFIRI plus Avastin 10/04/2019 ? Cycle 14 FOLFIRI plus Avastin 10/25/2019 ? Cycle 15 FOLFIRI plus Avastin 11/14/2019 ? Cycle 16 FOLFIRI plus Avastin 12/06/2019 ? CTs neck, chest, abdomen, pelvis 12/21/2019-neck negative; mildly progressive pulmonary metastases; abdomen and pelvis negative for evidence of metastatic disease. ? Cycle 1 FOLFOX/Avastin 12/27/2019 ? 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 Imodium as needed. 6.Mucositis secondary to Xeloda. Improved 07/19/2018. 7. History of migraines   Disposition: Tina Morales appears stable.  She has completed 1 cycle of FOLFOX/Avastin.  Plan to proceed with cycle 2 today as scheduled.  We reviewed the CBC from today.  Counts adequate to proceed as above.  The dysuria/skin irritation at the labia following treatment may be a manifestation of mucositis related to 5-fluorouracil.  The 5-fluorouracil was dose reduced with treatment on 12/27/2019.  We will maintain that dose for now, adjust further if symptoms persist.  She will  return for lab, follow-up, FOLFOX/Avastin in 3 weeks.  Plan reviewed with Dr. Benay Spice.    Ned Card ANP/GNP-BC   01/17/2020  12:26 PM

## 2020-01-17 NOTE — Patient Instructions (Signed)
[facility] Discharge Instructions for Patients Receiving Chemotherapy  Today you received the following chemotherapy agents: Avastin, Oxaliplatin, Leucovorin, 5FU  To help prevent nausea and vomiting after your treatment, we encourage you to take your nausea medication as directed.   If you develop nausea and vomiting that is not controlled by your nausea medication, call the clinic.   BELOW ARE SYMPTOMS THAT SHOULD BE REPORTED IMMEDIATELY:  *FEVER GREATER THAN 100.5 F  *CHILLS WITH OR WITHOUT FEVER  NAUSEA AND VOMITING THAT IS NOT CONTROLLED WITH YOUR NAUSEA MEDICATION  *UNUSUAL SHORTNESS OF BREATH  *UNUSUAL BRUISING OR BLEEDING  TENDERNESS IN MOUTH AND THROAT WITH OR WITHOUT PRESENCE OF ULCERS  *URINARY PROBLEMS  *BOWEL PROBLEMS  UNUSUAL RASH Items with * indicate a potential emergency and should be followed up as soon as possible.  Feel free to call the clinic should you have any questions or concerns. The clinic phone number is [phone].  Please show the CHEMO ALERT CARD at check-in to the Emergency Department and triage nurse.       

## 2020-01-19 ENCOUNTER — Inpatient Hospital Stay: Payer: BC Managed Care – PPO

## 2020-01-19 ENCOUNTER — Other Ambulatory Visit: Payer: Self-pay

## 2020-01-19 VITALS — BP 137/88 | HR 81 | Temp 98.1°F | Resp 20

## 2020-01-19 DIAGNOSIS — C20 Malignant neoplasm of rectum: Secondary | ICD-10-CM | POA: Diagnosis not present

## 2020-01-19 MED ORDER — SODIUM CHLORIDE 0.9% FLUSH
10.0000 mL | INTRAVENOUS | Status: DC | PRN
  Administered 2020-01-19: 10 mL
  Filled 2020-01-19: qty 10

## 2020-01-19 MED ORDER — HEPARIN SOD (PORK) LOCK FLUSH 100 UNIT/ML IV SOLN
500.0000 [IU] | Freq: Once | INTRAVENOUS | Status: AC | PRN
  Administered 2020-01-19: 500 [IU]
  Filled 2020-01-19: qty 5

## 2020-01-22 ENCOUNTER — Telehealth: Payer: Self-pay | Admitting: Nurse Practitioner

## 2020-01-22 NOTE — Telephone Encounter (Signed)
Scheduled per 7/8 los and 7/13 staff msg. Called and spoke with pt, confirmed 8/19 appts

## 2020-02-03 ENCOUNTER — Other Ambulatory Visit: Payer: Self-pay | Admitting: Oncology

## 2020-02-07 ENCOUNTER — Other Ambulatory Visit: Payer: Self-pay

## 2020-02-07 ENCOUNTER — Inpatient Hospital Stay: Payer: BC Managed Care – PPO

## 2020-02-07 ENCOUNTER — Inpatient Hospital Stay (HOSPITAL_BASED_OUTPATIENT_CLINIC_OR_DEPARTMENT_OTHER): Payer: BC Managed Care – PPO | Admitting: Oncology

## 2020-02-07 VITALS — BP 121/91 | HR 85 | Temp 97.2°F | Resp 15 | Ht 67.0 in | Wt 139.1 lb

## 2020-02-07 DIAGNOSIS — Z95828 Presence of other vascular implants and grafts: Secondary | ICD-10-CM

## 2020-02-07 DIAGNOSIS — C20 Malignant neoplasm of rectum: Secondary | ICD-10-CM

## 2020-02-07 LAB — CBC WITH DIFFERENTIAL (CANCER CENTER ONLY)
Abs Immature Granulocytes: 0.03 10*3/uL (ref 0.00–0.07)
Basophils Absolute: 0 10*3/uL (ref 0.0–0.1)
Basophils Relative: 0 %
Eosinophils Absolute: 0.1 10*3/uL (ref 0.0–0.5)
Eosinophils Relative: 2 %
HCT: 31.5 % — ABNORMAL LOW (ref 36.0–46.0)
Hemoglobin: 9.6 g/dL — ABNORMAL LOW (ref 12.0–15.0)
Immature Granulocytes: 1 %
Lymphocytes Relative: 19 %
Lymphs Abs: 0.9 10*3/uL (ref 0.7–4.0)
MCH: 19.6 pg — ABNORMAL LOW (ref 26.0–34.0)
MCHC: 30.5 g/dL (ref 30.0–36.0)
MCV: 64.3 fL — ABNORMAL LOW (ref 80.0–100.0)
Monocytes Absolute: 0.7 10*3/uL (ref 0.1–1.0)
Monocytes Relative: 15 %
Neutro Abs: 2.9 10*3/uL (ref 1.7–7.7)
Neutrophils Relative %: 63 %
Platelet Count: 313 10*3/uL (ref 150–400)
RBC: 4.9 MIL/uL (ref 3.87–5.11)
RDW: 17.2 % — ABNORMAL HIGH (ref 11.5–15.5)
WBC Count: 4.7 10*3/uL (ref 4.0–10.5)
nRBC: 0 % (ref 0.0–0.2)

## 2020-02-07 LAB — CMP (CANCER CENTER ONLY)
ALT: 12 U/L (ref 0–44)
AST: 20 U/L (ref 15–41)
Albumin: 3.4 g/dL — ABNORMAL LOW (ref 3.5–5.0)
Alkaline Phosphatase: 133 U/L — ABNORMAL HIGH (ref 38–126)
Anion gap: 8 (ref 5–15)
BUN: 4 mg/dL — ABNORMAL LOW (ref 6–20)
CO2: 23 mmol/L (ref 22–32)
Calcium: 9.2 mg/dL (ref 8.9–10.3)
Chloride: 100 mmol/L (ref 98–111)
Creatinine: 0.62 mg/dL (ref 0.44–1.00)
GFR, Est AFR Am: >60 mL/min (ref >60)
GFR, Estimated: >60 mL/min (ref >60)
Glucose, Bld: 97 mg/dL (ref 70–99)
Potassium: 4.6 mmol/L (ref 3.5–5.1)
Sodium: 131 mmol/L — ABNORMAL LOW (ref 135–145)
Total Bilirubin: 0.4 mg/dL (ref 0.3–1.2)
Total Protein: 6.1 g/dL — ABNORMAL LOW (ref 6.5–8.1)

## 2020-02-07 LAB — MAGNESIUM: Magnesium: 2.1 mg/dL (ref 1.7–2.4)

## 2020-02-07 MED ORDER — SODIUM CHLORIDE 0.9 % IV SOLN
2000.0000 mg/m2 | INTRAVENOUS | Status: DC
  Administered 2020-02-07: 3200 mg via INTRAVENOUS
  Filled 2020-02-07: qty 64

## 2020-02-07 MED ORDER — PALONOSETRON HCL INJECTION 0.25 MG/5ML
0.2500 mg | Freq: Once | INTRAVENOUS | Status: AC
  Administered 2020-02-07: 0.25 mg via INTRAVENOUS

## 2020-02-07 MED ORDER — SODIUM CHLORIDE 0.9% FLUSH
10.0000 mL | Freq: Once | INTRAVENOUS | Status: AC
  Administered 2020-02-07: 10 mL
  Filled 2020-02-07: qty 10

## 2020-02-07 MED ORDER — DEXTROSE 5 % IV SOLN
INTRAVENOUS | Status: DC
  Filled 2020-02-07: qty 250

## 2020-02-07 MED ORDER — SODIUM CHLORIDE 0.9 % IV SOLN
150.0000 mg | Freq: Once | INTRAVENOUS | Status: AC
  Administered 2020-02-07: 150 mg via INTRAVENOUS
  Filled 2020-02-07: qty 150

## 2020-02-07 MED ORDER — OXALIPLATIN CHEMO INJECTION 100 MG/20ML
85.0000 mg/m2 | Freq: Once | INTRAVENOUS | Status: AC
  Administered 2020-02-07: 135 mg via INTRAVENOUS
  Filled 2020-02-07: qty 20

## 2020-02-07 MED ORDER — SODIUM CHLORIDE 0.9 % IV SOLN
Freq: Once | INTRAVENOUS | Status: AC
  Filled 2020-02-07: qty 250

## 2020-02-07 MED ORDER — SODIUM CHLORIDE 0.9 % IV SOLN
7.5000 mg/kg | Freq: Once | INTRAVENOUS | Status: AC
  Administered 2020-02-07: 500 mg via INTRAVENOUS
  Filled 2020-02-07: qty 4

## 2020-02-07 MED ORDER — SODIUM CHLORIDE 0.9 % IV SOLN: 5.0000 mg/kg | Freq: Once | INTRAVENOUS | Status: DC

## 2020-02-07 MED ORDER — PALONOSETRON HCL INJECTION 0.25 MG/5ML
INTRAVENOUS | Status: AC
  Filled 2020-02-07: qty 5

## 2020-02-07 MED ORDER — SODIUM CHLORIDE 0.9 % IV SOLN
10.0000 mg | Freq: Once | INTRAVENOUS | Status: AC
  Administered 2020-02-07: 10 mg via INTRAVENOUS
  Filled 2020-02-07: qty 10

## 2020-02-07 MED ORDER — LEUCOVORIN CALCIUM INJECTION 350 MG
200.0000 mg/m2 | Freq: Once | INTRAVENOUS | Status: AC
  Administered 2020-02-07: 318 mg via INTRAVENOUS
  Filled 2020-02-07: qty 15.9

## 2020-02-07 NOTE — Progress Notes (Signed)
I s/w Dr. Benay Spice & he's ok'd to increase Bevacizumab dose to 7.5 mg/kg since pt is getting tx q3 weeks.  Orders updated.  Kennith Center, Pharm.D., CPP 02/07/2020@11 :13 AM

## 2020-02-07 NOTE — Patient Instructions (Signed)
[facility] Discharge Instructions for Patients Receiving Chemotherapy  Today you received the following chemotherapy agents: Zirabev, Oxaliplatin, Leucovorin, and Fluorouracil  To help prevent nausea and vomiting after your treatment, we encourage you to take your nausea medication as prescribed.    If you develop nausea and vomiting that is not controlled by your nausea medication, call the clinic.   BELOW ARE SYMPTOMS THAT SHOULD BE REPORTED IMMEDIATELY:  *FEVER GREATER THAN 100.5 F  *CHILLS WITH OR WITHOUT FEVER  NAUSEA AND VOMITING THAT IS NOT CONTROLLED WITH YOUR NAUSEA MEDICATION  *UNUSUAL SHORTNESS OF BREATH  *UNUSUAL BRUISING OR BLEEDING  TENDERNESS IN MOUTH AND THROAT WITH OR WITHOUT PRESENCE OF ULCERS  *URINARY PROBLEMS  *BOWEL PROBLEMS  UNUSUAL RASH Items with * indicate a potential emergency and should be followed up as soon as possible.  Feel free to call the clinic should you have any questions or concerns. The clinic phone number is [phone].  Please show the [place] at check-in to the Emergency Department and triage nurse.

## 2020-02-07 NOTE — Progress Notes (Signed)
[facility] OFFICE PROGRESS NOTE   Diagnosis: Rectal cancer  INTERVAL HISTORY:   Tina Morales completed another cycle of FOLFOX/Avastin on 01/17/2020.  She has intermittent constipation and diarrhea.  No bleeding or symptom of thrombosis.  She has "irritation "at the labia and perineum following chemotherapy. No change in foot numbness since resuming oxaliplatin. Objective:  Vital signs in last 24 hours:  Blood pressure (!) 121/91, pulse 85, temperature (!) 97.2 F (36.2 C), temperature source Temporal, resp. rate 15, height 5' 7" (1.702 m), weight 139 lb 1.6 oz (63.1 kg), SpO2 100 %.    HEENT: No thrush or ulcers Resp: Lungs clear bilaterally Cardio: Regular rate and rhythm GI: No hepatomegaly, nontender, left lower quadrant colostomy Vascular: No leg edema    Portacath/PICC-without erythema  Lab Results:  Lab Results  Component Value Date   WBC 4.7 02/07/2020   HGB 9.6 (L) 02/07/2020   HCT 31.5 (L) 02/07/2020   MCV 64.3 (L) 02/07/2020   PLT 313 02/07/2020   NEUTROABS 2.9 02/07/2020    CMP  Lab Results  Component Value Date   NA 131 (L) 02/07/2020   K 4.6 02/07/2020   CL 100 02/07/2020   CO2 23 02/07/2020   GLUCOSE 97 02/07/2020   BUN 4 (L) 02/07/2020   CREATININE 0.62 02/07/2020   CALCIUM 9.2 02/07/2020   PROT 6.1 (L) 02/07/2020   ALBUMIN 3.4 (L) 02/07/2020   AST 20 02/07/2020   ALT 12 02/07/2020   ALKPHOS 133 (H) 02/07/2020   BILITOT 0.4 02/07/2020   GFRNONAA >60 02/07/2020   GFRAA >60 02/07/2020    Lab Results  Component Value Date   CEA1 1.95 06/13/2019    Medications: I have reviewed the patient's current medications.   Assessment/Plan: 1. Rectal cancer-rectal mass noted on digital exam 02/10/2018, colonoscopy confirmed a him my circumferential mass in the rectum ? 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 CTs 02/10/2018-anterior rectal wall thickening, pulmonary nodules measuring up to 9 mm concerning for metastases, few round perirectal lymph nodes ? Pelvic MRI 02/20/2018-hypermetabolic low rectal mass extending to the posterior vagina with 2 small enlarged perirectal lymph nodes, T4b,N1-2.3 cm from the anal verge ? PET scan 02/23/2018-hypermetabolic rectal mass, 8 mm hypermetabolic lingular nodule, scattered small bilateral lung nodules, some calcified, a few with mildly increased activity ? Cycle 1 FOLFOXIRI8/21/2019 ? Cycle 5 FOLFOXIRI10/16/2019 ? CTs 05/01/2018-significant interval response to therapy with decreased size of the primary rectal mass lesion. Decreasing perirectal lymphadenopathy. Decreased and/or resolved pulmonary nodules. No new sites of disease identified. ? Cycle 6 FOLFOXIRI10/31/2019 ? Radiation/Xeloda 06/12/2018-completed 07/21/2018 ? Xeloda dose reduced 07/10/2018 due to mucositis and diarrhea ? CTs 08/07/2018-compared to 02/10/2018-resolved and decreased pulmonary nodules, few tiny residual noncalcified nodules, decreased 20 perirectal lymph nodes, soft tissue of the rectum indistinguishable from posterior wall of vagina-Vaginal involvement by tumor? ? 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 CT chest 09/29/2018-enlargement of lung nodules, not a candidate for SBRT based on discussion in GI tumor conference and with radiation oncology ? CT chest 11/27/18 - interval growth of numerous pulmonary metastases bilaterally compared to 09/29/18, no new pulmonary mets ? Cycle 1FOLFOXIRI 11/29/18 ? Cycle 2 FOLFOXIRI6/10/2018 ? Cycle 3   FOLFOXIRI  12/28/2018 ? Cycle 4 FOLFIRINOX 01/11/2019 ? CTs 01/24/2019-significant improvement of bilateral pulmonary metastases. ? Cycle 1 FOLFIRI 02/01/2019 ? Cycle 2 FOLFIRI 02/22/2019 ? Cycle 3 FOLFIRI 03/15/2019 ? Cycle 4 FOLFIRI 04/05/2019 ? Cycle 5 FOLFIRI 04/26/2019 ? CTs 05/14/2019-slight enlargement of right lung nodules, no other evidence of disease progression ? Cycle 6 FOLFIRI 05/17/2019 ? Cycle 7 FOLFIRI plus Avastin 06/13/2019 ? Cycle 8 FOLFIRI plus Avastin 07/09/2019 ? Cycle 9 FOLFIRI plus Avastin 07/25/2019 ? Cycle 10 FOLFIRI plus Avastin 08/09/2019 ? Cycle 11 FOLFIRI plus Avastin 08/22/2019 ? CTs 09/07/2019-most pulmonary nodules are stable. 1 nodule right lower lobe slightly larger. No new lung lesions. ? Cycle 12 FOLFIRI plus Avastin 09/13/2019 ? Cycle 13 FOLFIRI plus Avastin 10/04/2019 ? Cycle 14 FOLFIRI plus Avastin 10/25/2019 ? Cycle 15 FOLFIRI plus Avastin 11/14/2019 ? Cycle 16 FOLFIRI plus Avastin 12/06/2019 ? CTs neck, chest, abdomen, pelvis 12/21/2019-neck negative; mildly progressive pulmonary metastases; abdomen and pelvis negative for evidence of metastatic disease. ? Cycle 1 FOLFOX/Avastin 12/27/2019 ? Cycle 2 FOLFOX/Avastin 01/17/2020 ? 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 Imodium as needed. 6.Mucositis secondary to Xeloda. Improved 07/19/2018. 7. History of migraines    Disposition: Tina Morales appears unchanged.  She will complete another cycle of FOLFOX/Avastin today.  She will return for an office visit and chemotherapy in 3 weeks.  She will be referred for restaging CTs after cycle 5 FOLFOX/Avastin.  Betsy Coder, MD  02/07/2020  10:51 AM

## 2020-02-07 NOTE — Patient Instructions (Signed)

## 2020-02-08 ENCOUNTER — Telehealth: Payer: Self-pay | Admitting: Oncology

## 2020-02-08 NOTE — Telephone Encounter (Signed)
Scheduled appts per 7/29 los. Pt to get updated appt calendar at next visit per appt notes.

## 2020-02-09 ENCOUNTER — Other Ambulatory Visit: Payer: Self-pay

## 2020-02-09 ENCOUNTER — Inpatient Hospital Stay: Payer: BC Managed Care – PPO

## 2020-02-09 VITALS — BP 123/90 | HR 94 | Resp 18

## 2020-02-09 DIAGNOSIS — C20 Malignant neoplasm of rectum: Secondary | ICD-10-CM

## 2020-02-09 MED ORDER — HEPARIN SOD (PORK) LOCK FLUSH 100 UNIT/ML IV SOLN
500.0000 [IU] | Freq: Once | INTRAVENOUS | Status: AC | PRN
  Administered 2020-02-09: 500 [IU]
  Filled 2020-02-09: qty 5

## 2020-02-09 MED ORDER — SODIUM CHLORIDE 0.9% FLUSH
10.0000 mL | INTRAVENOUS | Status: DC | PRN
  Administered 2020-02-09: 10 mL
  Filled 2020-02-09: qty 10

## 2020-02-09 NOTE — Patient Instructions (Signed)

## 2020-02-15 ENCOUNTER — Other Ambulatory Visit: Payer: Self-pay

## 2020-02-15 ENCOUNTER — Other Ambulatory Visit: Payer: Self-pay | Admitting: Nurse Practitioner

## 2020-02-15 DIAGNOSIS — C20 Malignant neoplasm of rectum: Secondary | ICD-10-CM

## 2020-02-15 MED ORDER — ALPRAZOLAM 0.5 MG PO TABS: 0.5000 mg | ORAL_TABLET | Freq: Two times a day (BID) | ORAL | 0 refills | Status: DC | PRN

## 2020-02-24 ENCOUNTER — Other Ambulatory Visit: Payer: Self-pay | Admitting: Oncology

## 2020-02-28 ENCOUNTER — Encounter: Payer: Self-pay | Admitting: Nurse Practitioner

## 2020-02-28 ENCOUNTER — Inpatient Hospital Stay: Payer: BC Managed Care – PPO

## 2020-02-28 ENCOUNTER — Other Ambulatory Visit: Payer: Self-pay

## 2020-02-28 ENCOUNTER — Inpatient Hospital Stay: Payer: BC Managed Care – PPO | Attending: Oncology | Admitting: Nurse Practitioner

## 2020-02-28 VITALS — BP 132/95 | HR 105 | Temp 98.0°F | Resp 18 | Ht 67.0 in | Wt 140.5 lb

## 2020-02-28 VITALS — BP 122/91 | HR 89

## 2020-02-28 DIAGNOSIS — Z5112 Encounter for antineoplastic immunotherapy: Secondary | ICD-10-CM | POA: Diagnosis present

## 2020-02-28 DIAGNOSIS — Z95828 Presence of other vascular implants and grafts: Secondary | ICD-10-CM

## 2020-02-28 DIAGNOSIS — C20 Malignant neoplasm of rectum: Secondary | ICD-10-CM | POA: Diagnosis not present

## 2020-02-28 DIAGNOSIS — C7802 Secondary malignant neoplasm of left lung: Secondary | ICD-10-CM | POA: Diagnosis not present

## 2020-02-28 DIAGNOSIS — Z5111 Encounter for antineoplastic chemotherapy: Secondary | ICD-10-CM | POA: Diagnosis present

## 2020-02-28 DIAGNOSIS — C7801 Secondary malignant neoplasm of right lung: Secondary | ICD-10-CM | POA: Diagnosis not present

## 2020-02-28 LAB — CBC WITH DIFFERENTIAL (CANCER CENTER ONLY)
Abs Immature Granulocytes: 0.03 10*3/uL (ref 0.00–0.07)
Basophils Absolute: 0 10*3/uL (ref 0.0–0.1)
Basophils Relative: 1 %
Eosinophils Absolute: 0.1 10*3/uL (ref 0.0–0.5)
Eosinophils Relative: 2 %
HCT: 32.9 % — ABNORMAL LOW (ref 36.0–46.0)
Hemoglobin: 10 g/dL — ABNORMAL LOW (ref 12.0–15.0)
Immature Granulocytes: 1 %
Lymphocytes Relative: 24 %
Lymphs Abs: 1 10*3/uL (ref 0.7–4.0)
MCH: 20 pg — ABNORMAL LOW (ref 26.0–34.0)
MCHC: 30.4 g/dL (ref 30.0–36.0)
MCV: 65.7 fL — ABNORMAL LOW (ref 80.0–100.0)
Monocytes Absolute: 0.7 10*3/uL (ref 0.1–1.0)
Monocytes Relative: 16 %
Neutro Abs: 2.4 10*3/uL (ref 1.7–7.7)
Neutrophils Relative %: 56 %
Platelet Count: 241 10*3/uL (ref 150–400)
RBC: 5.01 MIL/uL (ref 3.87–5.11)
RDW: 17.3 % — ABNORMAL HIGH (ref 11.5–15.5)
WBC Count: 4.2 10*3/uL (ref 4.0–10.5)
nRBC: 0 % (ref 0.0–0.2)

## 2020-02-28 LAB — CMP (CANCER CENTER ONLY)
ALT: 26 U/L (ref 0–44)
AST: 33 U/L (ref 15–41)
Albumin: 3.3 g/dL — ABNORMAL LOW (ref 3.5–5.0)
Alkaline Phosphatase: 163 U/L — ABNORMAL HIGH (ref 38–126)
Anion gap: 5 (ref 5–15)
BUN: 7 mg/dL (ref 6–20)
CO2: 24 mmol/L (ref 22–32)
Calcium: 8.7 mg/dL — ABNORMAL LOW (ref 8.9–10.3)
Chloride: 100 mmol/L (ref 98–111)
Creatinine: 0.6 mg/dL (ref 0.44–1.00)
GFR, Est AFR Am: >60 mL/min (ref >60)
GFR, Estimated: >60 mL/min (ref >60)
Glucose, Bld: 80 mg/dL (ref 70–99)
Potassium: 4.1 mmol/L (ref 3.5–5.1)
Sodium: 129 mmol/L — ABNORMAL LOW (ref 135–145)
Total Bilirubin: 0.4 mg/dL (ref 0.3–1.2)
Total Protein: 5.9 g/dL — ABNORMAL LOW (ref 6.5–8.1)

## 2020-02-28 LAB — TOTAL PROTEIN, URINE DIPSTICK: Protein, ur: NEGATIVE mg/dL

## 2020-02-28 MED ORDER — SODIUM CHLORIDE 0.9 % IV SOLN
10.0000 mg | Freq: Once | INTRAVENOUS | Status: AC
  Administered 2020-02-28: 10 mg via INTRAVENOUS
  Filled 2020-02-28: qty 10

## 2020-02-28 MED ORDER — SODIUM CHLORIDE 0.9% FLUSH
10.0000 mL | Freq: Once | INTRAVENOUS | Status: AC
  Administered 2020-02-28: 10 mL
  Filled 2020-02-28: qty 10

## 2020-02-28 MED ORDER — SODIUM CHLORIDE 0.9 % IV SOLN
150.0000 mg | Freq: Once | INTRAVENOUS | Status: AC
  Administered 2020-02-28: 150 mg via INTRAVENOUS
  Filled 2020-02-28: qty 150

## 2020-02-28 MED ORDER — OXALIPLATIN CHEMO INJECTION 100 MG/20ML
85.0000 mg/m2 | Freq: Once | INTRAVENOUS | Status: AC
  Administered 2020-02-28: 135 mg via INTRAVENOUS
  Filled 2020-02-28: qty 10

## 2020-02-28 MED ORDER — SODIUM CHLORIDE 0.9% FLUSH
10.0000 mL | INTRAVENOUS | Status: DC | PRN
  Filled 2020-02-28: qty 10

## 2020-02-28 MED ORDER — PALONOSETRON HCL INJECTION 0.25 MG/5ML
0.2500 mg | Freq: Once | INTRAVENOUS | Status: AC
  Administered 2020-02-28: 0.25 mg via INTRAVENOUS

## 2020-02-28 MED ORDER — PALONOSETRON HCL INJECTION 0.25 MG/5ML
INTRAVENOUS | Status: AC
  Filled 2020-02-28: qty 5

## 2020-02-28 MED ORDER — DEXTROSE 5 % IV SOLN
INTRAVENOUS | Status: DC
  Filled 2020-02-28: qty 250

## 2020-02-28 MED ORDER — ALTEPLASE 2 MG IJ SOLR
INTRAMUSCULAR | Status: AC
  Filled 2020-02-28: qty 2

## 2020-02-28 MED ORDER — LEUCOVORIN CALCIUM INJECTION 350 MG
200.0000 mg/m2 | Freq: Once | INTRAVENOUS | Status: AC
  Administered 2020-02-28: 318 mg via INTRAVENOUS
  Filled 2020-02-28: qty 15.9

## 2020-02-28 MED ORDER — ALTEPLASE 2 MG IJ SOLR
2.0000 mg | Freq: Once | INTRAMUSCULAR | Status: AC
  Administered 2020-02-28: 2 mg
  Filled 2020-02-28: qty 2

## 2020-02-28 MED ORDER — SODIUM CHLORIDE 0.9 % IV SOLN
2000.0000 mg/m2 | INTRAVENOUS | Status: DC
  Administered 2020-02-28: 3200 mg via INTRAVENOUS
  Filled 2020-02-28: qty 64

## 2020-02-28 NOTE — Progress Notes (Signed)
CATHFLO given at 1121 by Hattie Perch, RN and I drew blood peripherally.

## 2020-02-28 NOTE — Patient Instructions (Signed)
[facility] Discharge Instructions for Patients Receiving Chemotherapy  Today you received the following chemotherapy agents: Oxaliplatin, Leucovorin, and Fluorouracil  To help prevent nausea and vomiting after your treatment, we encourage you to take your nausea medication  as prescribed.    If you develop nausea and vomiting that is not controlled by your nausea medication, call the clinic.   BELOW ARE SYMPTOMS THAT SHOULD BE REPORTED IMMEDIATELY:  *FEVER GREATER THAN 100.5 F  *CHILLS WITH OR WITHOUT FEVER  NAUSEA AND VOMITING THAT IS NOT CONTROLLED WITH YOUR NAUSEA MEDICATION  *UNUSUAL SHORTNESS OF BREATH  *UNUSUAL BRUISING OR BLEEDING  TENDERNESS IN MOUTH AND THROAT WITH OR WITHOUT PRESENCE OF ULCERS  *URINARY PROBLEMS  *BOWEL PROBLEMS  UNUSUAL RASH Items with * indicate a potential emergency and should be followed up as soon as possible.  Feel free to call the clinic should you have any questions or concerns. The clinic phone number is [phone].  Please show the CHEMO ALERT CARD at check-in to the Emergency Department and triage nurse.   

## 2020-02-28 NOTE — Patient Instructions (Signed)

## 2020-02-28 NOTE — Progress Notes (Signed)
[place] OFFICE PROGRESS NOTE   Diagnosis: Rectal cancer  INTERVAL HISTORY:   Tina Morales returns as scheduled.  She completed cycle 3 FOLFOX/Avastin 02/07/2020.  She has intermittent nausea.  No mouth sores though she does note gum irritation and recent fracturing of several teeth and loose teeth.  She has intermittent constipation/diarrhea.  Stable mild numbness in the feet, none in the hands.  No fever, cough, shortness of breath.  Objective:  Vital signs in last 24 hours:  Blood pressure (!) 132/95, pulse (!) 105, temperature 98 F (36.7 C), resp. rate 18, height 5' 7"  (1.702 m), weight 140 lb 8 oz (63.7 kg), SpO2 98 %.    HEENT: Multiple missing teeth and fractured teeth.  No thrush or ulcers. Resp: Lungs clear bilaterally. Cardio: Regular rate and rhythm. GI: Abdomen soft and nontender.  No hepatomegaly.  Left lower quadrant colostomy. Vascular: No leg edema. Port-A-Cath without erythema.  Lab Results:  Lab Results  Component Value Date   WBC 4.2 02/28/2020   HGB 10.0 (L) 02/28/2020   HCT 32.9 (L) 02/28/2020   MCV 65.7 (L) 02/28/2020   PLT 241 02/28/2020   NEUTROABS 2.4 02/28/2020    Imaging:  No results found.  Medications: I have reviewed the patient's current medications.  Assessment/Plan: 1. Rectal cancer-rectal mass noted on digital exam 02/10/2018, colonoscopy confirmed a him my circumferential mass in the rectum ? 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 CTs 02/10/2018-anterior rectal wall thickening, pulmonary nodules measuring up to 9 mm concerning for metastases, few round perirectal lymph nodes ? Pelvic MRI 02/20/2018-hypermetabolic low rectal mass extending to the posterior vagina with 2 small enlarged  perirectal lymph nodes, T4b,N1-2.3 cm from the anal verge ? PET scan 02/23/2018-hypermetabolic rectal mass, 8 mm hypermetabolic lingular nodule, scattered small bilateral lung nodules, some calcified, a few with mildly increased activity ? Cycle 1 FOLFOXIRI8/21/2019 ? Cycle 5 FOLFOXIRI10/16/2019 ? CTs 05/01/2018-significant interval response to therapy with decreased size of the primary rectal mass lesion. Decreasing perirectal lymphadenopathy. Decreased and/or resolved pulmonary nodules. No new sites of disease identified. ? Cycle 6 FOLFOXIRI10/31/2019 ? Radiation/Xeloda 06/12/2018-completed 07/21/2018 ? Xeloda dose reduced 07/10/2018 due to mucositis and diarrhea ? CTs 08/07/2018-compared to 02/10/2018-resolved and decreased pulmonary nodules, few tiny residual noncalcified nodules, decreased 20 perirectal lymph nodes, soft tissue of the rectum indistinguishable from posterior wall of vagina-Vaginal involvement by tumor? ? 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 CT chest 09/29/2018-enlargement of lung nodules, not a candidate for SBRT based on discussion in GI tumor conference and with radiation oncology ? CT chest 11/27/18 - interval growth of numerous pulmonary metastases bilaterally compared to 09/29/18, no new pulmonary mets ? Cycle 1FOLFOXIRI 11/29/18 ? Cycle 2 FOLFOXIRI6/10/2018 ? Cycle 3 FOLFOXIRI 12/28/2018 ? Cycle 4 FOLFIRINOX 01/11/2019 ? CTs 01/24/2019-significant improvement of bilateral pulmonary metastases. ? Cycle 1 FOLFIRI 02/01/2019 ? Cycle 2 FOLFIRI 02/22/2019 ? Cycle 3 FOLFIRI 03/15/2019 ? Cycle 4 FOLFIRI 04/05/2019 ? Cycle 5 FOLFIRI 04/26/2019 ? CTs 05/14/2019-slight enlargement of right lung nodules, no other evidence of disease progression ? Cycle 6  FOLFIRI 05/17/2019 ? Cycle 7 FOLFIRI plus Avastin 06/13/2019 ? Cycle 8 FOLFIRI plus Avastin 07/09/2019 ? Cycle 9 FOLFIRI plus Avastin  07/25/2019 ? Cycle 10 FOLFIRI plus Avastin 08/09/2019 ? Cycle 11 FOLFIRI plus Avastin 08/22/2019 ? CTs 09/07/2019-most pulmonary nodules are stable. 1 nodule right lower lobe slightly larger. No new lung lesions. ? Cycle 12 FOLFIRI plus Avastin 09/13/2019 ? Cycle 13 FOLFIRI plus Avastin 10/04/2019 ? Cycle 14 FOLFIRI plus Avastin 10/25/2019 ? Cycle 15 FOLFIRI plus Avastin 11/14/2019 ? Cycle 16 FOLFIRI plus Avastin 12/06/2019 ? CTs neck, chest, abdomen, pelvis 12/21/2019-neck negative; mildly progressive pulmonary metastases; abdomen and pelvis negative for evidence of metastatic disease. ? Cycle 1 FOLFOX/Avastin 12/27/2019 ? Cycle 2 FOLFOX/Avastin 01/17/2020 ? Cycle 3 FOLFOX/Avastin 02/07/2020 ? 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 Imodium as needed. 6.Mucositis secondary to Xeloda. Improved 07/19/2018. 7. History of migraines   Disposition: Tina Morales appears stable.  She has completed 3 cycles of FOLFOX/Avastin.  Plan to proceed with treatment today as scheduled but will give FOLFOX alone due to the dental issues she is experiencing, hold Avastin.  We are making a referral to Dr. Enrique Sack.  She may require multiple extractions.  We reviewed the CBC and chemistry panel from today.  Labs adequate to proceed with treatment as above.  She will return for lab, follow-up, treatment in 3 weeks.  She will undergo restaging CTs after completing cycle 5.  Plan reviewed with Dr. Benay Spice.    Ned Card ANP/GNP-BC   02/28/2020  12:08 PM

## 2020-02-29 ENCOUNTER — Telehealth: Payer: Self-pay | Admitting: Oncology

## 2020-02-29 ENCOUNTER — Telehealth: Payer: Self-pay | Admitting: Nurse Practitioner

## 2020-02-29 NOTE — Telephone Encounter (Signed)
Scheduled appointments per 8/19 los. Left message for patient with appointment date and times.

## 2020-02-29 NOTE — Telephone Encounter (Signed)
Scheduled appointments per 8/19 los. Left message for patient with appointments date and times.  

## 2020-03-01 ENCOUNTER — Inpatient Hospital Stay: Payer: BC Managed Care – PPO

## 2020-03-01 ENCOUNTER — Other Ambulatory Visit: Payer: Self-pay

## 2020-03-01 VITALS — BP 125/86 | HR 100 | Temp 98.9°F | Resp 18

## 2020-03-01 DIAGNOSIS — C20 Malignant neoplasm of rectum: Secondary | ICD-10-CM | POA: Diagnosis not present

## 2020-03-01 DIAGNOSIS — Z95828 Presence of other vascular implants and grafts: Secondary | ICD-10-CM

## 2020-03-01 MED ORDER — HEPARIN SOD (PORK) LOCK FLUSH 100 UNIT/ML IV SOLN
500.0000 [IU] | Freq: Once | INTRAVENOUS | Status: AC
  Administered 2020-03-01: 500 [IU]
  Filled 2020-03-01: qty 5

## 2020-03-01 MED ORDER — SODIUM CHLORIDE 0.9% FLUSH
10.0000 mL | Freq: Once | INTRAVENOUS | Status: AC
  Administered 2020-03-01: 10 mL
  Filled 2020-03-01: qty 10

## 2020-03-01 NOTE — Patient Instructions (Signed)

## 2020-03-03 ENCOUNTER — Other Ambulatory Visit: Payer: Self-pay | Admitting: *Deleted

## 2020-03-03 DIAGNOSIS — C20 Malignant neoplasm of rectum: Secondary | ICD-10-CM

## 2020-03-03 MED ORDER — PROCHLORPERAZINE MALEATE 10 MG PO TABS: 10.0000 mg | ORAL_TABLET | Freq: Four times a day (QID) | ORAL | 1 refills | Status: DC | PRN

## 2020-03-03 MED ORDER — POTASSIUM CHLORIDE CRYS ER 20 MEQ PO TBCR: 20.0000 meq | EXTENDED_RELEASE_TABLET | Freq: Two times a day (BID) | ORAL | 1 refills | Status: DC

## 2020-03-03 NOTE — Progress Notes (Signed)
Patient left VM requesting refill on her K+ and Compazine. Refills sent

## 2020-03-17 ENCOUNTER — Other Ambulatory Visit: Payer: Self-pay | Admitting: Oncology

## 2020-03-18 ENCOUNTER — Telehealth: Payer: Self-pay | Admitting: *Deleted

## 2020-03-18 DIAGNOSIS — C20 Malignant neoplasm of rectum: Secondary | ICD-10-CM

## 2020-03-18 MED ORDER — ALPRAZOLAM 0.5 MG PO TABS: 0.5000 mg | ORAL_TABLET | Freq: Two times a day (BID) | ORAL | 1 refills | Status: DC | PRN

## 2020-03-18 NOTE — Telephone Encounter (Signed)
Left VM requesting refill on her alprazolam

## 2020-03-20 ENCOUNTER — Inpatient Hospital Stay: Payer: BC Managed Care – PPO

## 2020-03-20 ENCOUNTER — Other Ambulatory Visit: Payer: Self-pay

## 2020-03-20 ENCOUNTER — Encounter: Payer: Self-pay | Admitting: Nurse Practitioner

## 2020-03-20 ENCOUNTER — Inpatient Hospital Stay: Payer: BC Managed Care – PPO | Attending: Oncology

## 2020-03-20 ENCOUNTER — Telehealth: Payer: Self-pay

## 2020-03-20 ENCOUNTER — Inpatient Hospital Stay (HOSPITAL_BASED_OUTPATIENT_CLINIC_OR_DEPARTMENT_OTHER): Payer: BC Managed Care – PPO | Admitting: Nurse Practitioner

## 2020-03-20 VITALS — BP 127/89 | HR 106 | Temp 99.2°F | Resp 16 | Ht 67.0 in | Wt 144.8 lb

## 2020-03-20 VITALS — HR 95

## 2020-03-20 DIAGNOSIS — R197 Diarrhea, unspecified: Secondary | ICD-10-CM | POA: Diagnosis not present

## 2020-03-20 DIAGNOSIS — Z79899 Other long term (current) drug therapy: Secondary | ICD-10-CM | POA: Insufficient documentation

## 2020-03-20 DIAGNOSIS — R5383 Other fatigue: Secondary | ICD-10-CM | POA: Insufficient documentation

## 2020-03-20 DIAGNOSIS — Z5111 Encounter for antineoplastic chemotherapy: Secondary | ICD-10-CM | POA: Diagnosis present

## 2020-03-20 DIAGNOSIS — R110 Nausea: Secondary | ICD-10-CM | POA: Diagnosis not present

## 2020-03-20 DIAGNOSIS — C20 Malignant neoplasm of rectum: Secondary | ICD-10-CM | POA: Diagnosis present

## 2020-03-20 DIAGNOSIS — G893 Neoplasm related pain (acute) (chronic): Secondary | ICD-10-CM | POA: Diagnosis not present

## 2020-03-20 DIAGNOSIS — C7802 Secondary malignant neoplasm of left lung: Secondary | ICD-10-CM | POA: Diagnosis not present

## 2020-03-20 DIAGNOSIS — Z95828 Presence of other vascular implants and grafts: Secondary | ICD-10-CM

## 2020-03-20 DIAGNOSIS — C7801 Secondary malignant neoplasm of right lung: Secondary | ICD-10-CM | POA: Diagnosis not present

## 2020-03-20 DIAGNOSIS — K5900 Constipation, unspecified: Secondary | ICD-10-CM | POA: Diagnosis not present

## 2020-03-20 LAB — CBC WITH DIFFERENTIAL (CANCER CENTER ONLY)
Abs Immature Granulocytes: 0.03 10*3/uL (ref 0.00–0.07)
Basophils Absolute: 0 10*3/uL (ref 0.0–0.1)
Basophils Relative: 1 %
Eosinophils Absolute: 0 10*3/uL (ref 0.0–0.5)
Eosinophils Relative: 1 %
HCT: 32.3 % — ABNORMAL LOW (ref 36.0–46.0)
Hemoglobin: 9.9 g/dL — ABNORMAL LOW (ref 12.0–15.0)
Immature Granulocytes: 1 %
Lymphocytes Relative: 20 %
Lymphs Abs: 0.9 10*3/uL (ref 0.7–4.0)
MCH: 20.3 pg — ABNORMAL LOW (ref 26.0–34.0)
MCHC: 30.7 g/dL (ref 30.0–36.0)
MCV: 66.3 fL — ABNORMAL LOW (ref 80.0–100.0)
Monocytes Absolute: 0.7 10*3/uL (ref 0.1–1.0)
Monocytes Relative: 15 %
Neutro Abs: 2.7 10*3/uL (ref 1.7–7.7)
Neutrophils Relative %: 62 %
Platelet Count: 196 10*3/uL (ref 150–400)
RBC: 4.87 MIL/uL (ref 3.87–5.11)
RDW: 19 % — ABNORMAL HIGH (ref 11.5–15.5)
WBC Count: 4.3 10*3/uL (ref 4.0–10.5)
nRBC: 0 % (ref 0.0–0.2)

## 2020-03-20 LAB — CMP (CANCER CENTER ONLY)
ALT: 33 U/L (ref 0–44)
AST: 45 U/L — ABNORMAL HIGH (ref 15–41)
Albumin: 3.3 g/dL — ABNORMAL LOW (ref 3.5–5.0)
Alkaline Phosphatase: 223 U/L — ABNORMAL HIGH (ref 38–126)
Anion gap: 8 (ref 5–15)
BUN: 10 mg/dL (ref 6–20)
CO2: 24 mmol/L (ref 22–32)
Calcium: 8.7 mg/dL — ABNORMAL LOW (ref 8.9–10.3)
Chloride: 102 mmol/L (ref 98–111)
Creatinine: 0.57 mg/dL (ref 0.44–1.00)
GFR, Est AFR Am: >60 mL/min (ref >60)
GFR, Estimated: >60 mL/min (ref >60)
Glucose, Bld: 89 mg/dL (ref 70–99)
Potassium: 4.5 mmol/L (ref 3.5–5.1)
Sodium: 134 mmol/L — ABNORMAL LOW (ref 135–145)
Total Bilirubin: 0.4 mg/dL (ref 0.3–1.2)
Total Protein: 6 g/dL — ABNORMAL LOW (ref 6.5–8.1)

## 2020-03-20 LAB — TOTAL PROTEIN, URINE DIPSTICK: Protein, ur: NEGATIVE mg/dL

## 2020-03-20 MED ORDER — DEXAMETHASONE 4 MG PO TABS: ORAL_TABLET | ORAL | 0 refills | Status: DC

## 2020-03-20 MED ORDER — LEUCOVORIN CALCIUM INJECTION 350 MG
200.0000 mg/m2 | Freq: Once | INTRAVENOUS | Status: AC
  Administered 2020-03-20: 318 mg via INTRAVENOUS
  Filled 2020-03-20: qty 15.9

## 2020-03-20 MED ORDER — DIPHENOXYLATE-ATROPINE 2.5-0.025 MG PO TABS: 2.0000 | ORAL_TABLET | Freq: Four times a day (QID) | ORAL | 0 refills | Status: DC | PRN

## 2020-03-20 MED ORDER — DEXTROSE 5 % IV SOLN
INTRAVENOUS | Status: DC
  Filled 2020-03-20: qty 250

## 2020-03-20 MED ORDER — CYCLOBENZAPRINE HCL 5 MG PO TABS: 5.0000 mg | ORAL_TABLET | Freq: Two times a day (BID) | ORAL | 0 refills | Status: DC | PRN

## 2020-03-20 MED ORDER — SODIUM CHLORIDE 0.9 % IV SOLN
10.0000 mg | Freq: Once | INTRAVENOUS | Status: AC
  Administered 2020-03-20: 10 mg via INTRAVENOUS
  Filled 2020-03-20: qty 10

## 2020-03-20 MED ORDER — SODIUM CHLORIDE 0.9 % IV SOLN
150.0000 mg | Freq: Once | INTRAVENOUS | Status: AC
  Administered 2020-03-20: 150 mg via INTRAVENOUS
  Filled 2020-03-20: qty 150

## 2020-03-20 MED ORDER — PALONOSETRON HCL INJECTION 0.25 MG/5ML
0.2500 mg | Freq: Once | INTRAVENOUS | Status: AC
  Administered 2020-03-20: 0.25 mg via INTRAVENOUS

## 2020-03-20 MED ORDER — SODIUM CHLORIDE 0.9 % IV SOLN
2000.0000 mg/m2 | INTRAVENOUS | Status: DC
  Administered 2020-03-20: 3200 mg via INTRAVENOUS
  Filled 2020-03-20: qty 64

## 2020-03-20 MED ORDER — PALONOSETRON HCL INJECTION 0.25 MG/5ML
INTRAVENOUS | Status: AC
  Filled 2020-03-20: qty 5

## 2020-03-20 MED ORDER — OXALIPLATIN CHEMO INJECTION 100 MG/20ML
85.0000 mg/m2 | Freq: Once | INTRAVENOUS | Status: AC
  Administered 2020-03-20: 135 mg via INTRAVENOUS
  Filled 2020-03-20: qty 20

## 2020-03-20 MED ORDER — SODIUM CHLORIDE 0.9% FLUSH
10.0000 mL | Freq: Once | INTRAVENOUS | Status: AC
  Administered 2020-03-20: 10 mL
  Filled 2020-03-20: qty 10

## 2020-03-20 NOTE — Patient Instructions (Signed)
[facility] Discharge Instructions for Patients Receiving Chemotherapy  Today you received the following chemotherapy agents: oxaliplatin, leucovorin, and fluorouracil.  To help prevent nausea and vomiting after your treatment, we encourage you to take your nausea medication as directed.   If you develop nausea and vomiting that is not controlled by your nausea medication, call the clinic.   BELOW ARE SYMPTOMS THAT SHOULD BE REPORTED IMMEDIATELY:  *FEVER GREATER THAN 100.5 F  *CHILLS WITH OR WITHOUT FEVER  NAUSEA AND VOMITING THAT IS NOT CONTROLLED WITH YOUR NAUSEA MEDICATION  *UNUSUAL SHORTNESS OF BREATH  *UNUSUAL BRUISING OR BLEEDING  TENDERNESS IN MOUTH AND THROAT WITH OR WITHOUT PRESENCE OF ULCERS  *URINARY PROBLEMS  *BOWEL PROBLEMS  UNUSUAL RASH Items with * indicate a potential emergency and should be followed up as soon as possible.  Feel free to call the clinic should you have any questions or concerns. The clinic phone number is [phone].  Please show the CHEMO ALERT CARD at check-in to the Emergency Department and triage nurse.   

## 2020-03-20 NOTE — Telephone Encounter (Signed)
Referral faxed to dental office [phone] per Np follow up from visit 02/28/2020 requesting visit and follow up with pt

## 2020-03-20 NOTE — Progress Notes (Signed)
Patient with weight gain. Leave chemo at previous doses today per Ned Card, NP.

## 2020-03-20 NOTE — Progress Notes (Signed)
[place] OFFICE PROGRESS NOTE   Diagnosis: Rectal cancer  INTERVAL HISTORY:   Tina Morales returns as scheduled.  She completed a fourth cycle of FOLFOX 02/28/2020.  Avastin was held due to possible upcoming dental extractions.  She was referred to Dr. Enrique Sack.  She reports she has not been contacted for the dental appointment.  She overall felt better following the most recent cycle of chemotherapy and attributes this to not receiving Avastin.  She had some nausea, no vomiting.  No mouth sores.  Continued constipation alternating with diarrhea.  She has fatigue.  She has intermittent neck and low back pain.  She denies bleeding.  No fever, cough, shortness of breath.  Objective:  Vital signs in last 24 hours:  Blood pressure 127/89, pulse (!) 106, temperature 99.2 F (37.3 C), temperature source Tympanic, resp. rate 16, height 5' 7"  (1.702 m), weight 144 lb 12.8 oz (65.7 kg), SpO2 100 %.    HEENT: No thrush or ulcers.  Multiple missing teeth and fractured teeth.  Overall poor dentition. Resp: Lungs clear bilaterally. Cardio: Regular rate and rhythm. GI: Abdomen soft and nontender.  No hepatomegaly.  Left lower quadrant colostomy. Vascular: No leg edema. Port-A-Cath without erythema.  Lab Results:  Lab Results  Component Value Date   WBC 4.2 02/28/2020   HGB 10.0 (L) 02/28/2020   HCT 32.9 (L) 02/28/2020   MCV 65.7 (L) 02/28/2020   PLT 241 02/28/2020   NEUTROABS 2.4 02/28/2020    Imaging:  No results found.  Medications: I have reviewed the patient's current medications.  Assessment/Plan: 1. Rectal cancer-rectal mass noted on digital exam 02/10/2018, colonoscopy confirmed a him my circumferential mass in the rectum ? 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 CTs 02/10/2018-anterior rectal wall thickening, pulmonary nodules measuring up to 9 mm concerning for metastases, few round perirectal lymph nodes ? Pelvic MRI 02/20/2018-hypermetabolic low rectal mass extending to the posterior vagina with 2 small enlarged perirectal lymph nodes, T4b,N1-2.3 cm from the anal verge ? PET scan 02/23/2018-hypermetabolic rectal mass, 8 mm hypermetabolic lingular nodule, scattered small bilateral lung nodules, some calcified, a few with mildly increased activity ? Cycle 1 FOLFOXIRI8/21/2019 ? Cycle 5 FOLFOXIRI10/16/2019 ? CTs 05/01/2018-significant interval response to therapy with decreased size of the primary rectal mass lesion. Decreasing perirectal lymphadenopathy. Decreased and/or resolved pulmonary nodules. No new sites of disease identified. ? Cycle 6 FOLFOXIRI10/31/2019 ? Radiation/Xeloda 06/12/2018-completed 07/21/2018 ? Xeloda dose reduced 07/10/2018 due to mucositis and diarrhea ? CTs 08/07/2018-compared to 02/10/2018-resolved and decreased pulmonary nodules, few tiny residual noncalcified nodules, decreased 20 perirectal lymph nodes, soft tissue of the rectum indistinguishable from posterior wall of vagina-Vaginal involvement by tumor? ? 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 CT chest 09/29/2018-enlargement of lung nodules, not a candidate for SBRT based on discussion in GI tumor conference and with radiation oncology ? CT chest 11/27/18 - interval growth of numerous pulmonary metastases bilaterally compared to 09/29/18, no new pulmonary mets ? Cycle 1FOLFOXIRI 11/29/18 ? Cycle 2 FOLFOXIRI6/10/2018 ? Cycle 3 FOLFOXIRI 12/28/2018 ? Cycle 4 FOLFIRINOX 01/11/2019 ? CTs 01/24/2019-significant improvement  of bilateral pulmonary metastases. ? Cycle 1 FOLFIRI 02/01/2019 ? Cycle 2 FOLFIRI 02/22/2019 ? Cycle 3 FOLFIRI  03/15/2019 ? Cycle 4 FOLFIRI 04/05/2019 ? Cycle 5 FOLFIRI 04/26/2019 ? CTs 05/14/2019-slight enlargement of right lung nodules, no other evidence of disease progression ? Cycle 6 FOLFIRI 05/17/2019 ? Cycle 7 FOLFIRI plus Avastin 06/13/2019 ? Cycle 8 FOLFIRI plus Avastin 07/09/2019 ? Cycle 9 FOLFIRI plus Avastin 07/25/2019 ? Cycle 10 FOLFIRI plus Avastin 08/09/2019 ? Cycle 11 FOLFIRI plus Avastin 08/22/2019 ? CTs 09/07/2019-most pulmonary nodules are stable. 1 nodule right lower lobe slightly larger. No new lung lesions. ? Cycle 12 FOLFIRI plus Avastin 09/13/2019 ? Cycle 13 FOLFIRI plus Avastin 10/04/2019 ? Cycle 14 FOLFIRI plus Avastin 10/25/2019 ? Cycle 15 FOLFIRI plus Avastin 11/14/2019 ? Cycle 16 FOLFIRI plus Avastin 12/06/2019 ? CTs neck, chest, abdomen, pelvis 12/21/2019-neck negative; mildly progressive pulmonary metastases; abdomen and pelvis negative for evidence of metastatic disease. ? Cycle 1 FOLFOX/Avastin 12/27/2019 ? Cycle 2 FOLFOX/Avastin 01/17/2020 ? Cycle 3 FOLFOX/Avastin 02/07/2020 ? Cycle 4 FOLFOX 02/28/2020, Avastin held due to need for dental evaluation possible extractions ? 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 Imodium as needed. 6.Mucositis secondary to Xeloda. Improved 07/19/2018. 7. History of migraines   Disposition: Tina Morales appears stable.  She has completed 3 cycles of FOLFOX/Avastin and a fourth cycle of FOLFOX without Avastin due to possible need for multiple tooth extractions.  Unfortunately she has not been evaluated by the dentist.  Plan to proceed with cycle 5 FOLFOX today as scheduled, hold Avastin.  She will  undergo restaging CTs prior to her next visit.  We reviewed the CBC and chemistry panel from today.  Labs adequate to proceed with treatment.  Prescription refills sent to her pharmacy for dexamethasone, Lomotil and Flexeril.  We will try to contact the dentist office to arrange for an appointment.  Plan for CT scans 04/04/2020.  She will return for follow-up in 3 weeks.  She will contact the office in the interim with any problems.  Plan reviewed with Dr. Benay Spice.      Ned Card ANP/GNP-BC   03/20/2020  9:15 AM

## 2020-03-21 ENCOUNTER — Telehealth: Payer: Self-pay | Admitting: Nurse Practitioner

## 2020-03-21 ENCOUNTER — Other Ambulatory Visit: Payer: Self-pay

## 2020-03-21 DIAGNOSIS — C20 Malignant neoplasm of rectum: Secondary | ICD-10-CM

## 2020-03-21 MED ORDER — DEXAMETHASONE 4 MG PO TABS: ORAL_TABLET | ORAL | 0 refills | Status: DC

## 2020-03-21 NOTE — Progress Notes (Signed)
Refill request for dexamethasone completed as requested

## 2020-03-21 NOTE — Telephone Encounter (Signed)
Scheduled appointments per 9/9 los. Patient is aware of appointments details.

## 2020-03-22 ENCOUNTER — Other Ambulatory Visit: Payer: Self-pay

## 2020-03-22 ENCOUNTER — Inpatient Hospital Stay: Payer: BC Managed Care – PPO

## 2020-03-22 VITALS — BP 126/92 | HR 95 | Temp 97.5°F | Resp 16

## 2020-03-22 DIAGNOSIS — C20 Malignant neoplasm of rectum: Secondary | ICD-10-CM | POA: Diagnosis not present

## 2020-03-22 MED ORDER — HEPARIN SOD (PORK) LOCK FLUSH 100 UNIT/ML IV SOLN
500.0000 [IU] | Freq: Once | INTRAVENOUS | Status: AC | PRN
  Administered 2020-03-22: 500 [IU]
  Filled 2020-03-22: qty 5

## 2020-03-22 MED ORDER — SODIUM CHLORIDE 0.9% FLUSH
10.0000 mL | INTRAVENOUS | Status: DC | PRN
  Administered 2020-03-22: 10 mL
  Filled 2020-03-22: qty 10

## 2020-03-22 NOTE — Patient Instructions (Signed)

## 2020-03-24 NOTE — Telephone Encounter (Signed)
I returned this to your office this morning  Thank you  Colletta [place]

## 2020-03-26 ENCOUNTER — Telehealth (HOSPITAL_COMMUNITY): Payer: Self-pay

## 2020-03-26 NOTE — Telephone Encounter (Signed)
I called patient to schedule a New Outpatient appt for 03-27-20. I left message on patients answering machine for patient to return call to Dental Medicine to schedule appt.

## 2020-04-01 NOTE — Telephone Encounter (Signed)
Last Avastin 02/07/2020  She can be off of Avastin following the dental procedure  Thanks

## 2020-04-04 ENCOUNTER — Other Ambulatory Visit: Payer: Self-pay

## 2020-04-04 ENCOUNTER — Ambulatory Visit (HOSPITAL_COMMUNITY)
Admission: RE | Admit: 2020-04-04 | Discharge: 2020-04-04 | Disposition: A | Discharge status: Home / Self Care | Payer: BC Managed Care – PPO | Source: Ambulatory Visit | Attending: Nurse Practitioner | Admitting: Nurse Practitioner

## 2020-04-04 DIAGNOSIS — C20 Malignant neoplasm of rectum: Secondary | ICD-10-CM | POA: Diagnosis not present

## 2020-04-04 MED ORDER — HEPARIN SOD (PORK) LOCK FLUSH 100 UNIT/ML IV SOLN
INTRAVENOUS | Status: AC
  Filled 2020-04-04: qty 5

## 2020-04-04 MED ORDER — IOHEXOL 300 MG/ML  SOLN
100.0000 mL | Freq: Once | INTRAMUSCULAR | Status: AC | PRN
  Administered 2020-04-04: 100 mL via INTRAVENOUS

## 2020-04-04 MED ORDER — HEPARIN SOD (PORK) LOCK FLUSH 100 UNIT/ML IV SOLN
500.0000 [IU] | Freq: Once | INTRAVENOUS | Status: AC
  Administered 2020-04-04: 500 [IU] via INTRAVENOUS

## 2020-04-06 ENCOUNTER — Other Ambulatory Visit: Payer: Self-pay | Admitting: Oncology

## 2020-04-09 ENCOUNTER — Other Ambulatory Visit: Payer: Self-pay | Admitting: *Deleted

## 2020-04-09 DIAGNOSIS — C20 Malignant neoplasm of rectum: Secondary | ICD-10-CM

## 2020-04-10 ENCOUNTER — Inpatient Hospital Stay (HOSPITAL_BASED_OUTPATIENT_CLINIC_OR_DEPARTMENT_OTHER): Payer: BC Managed Care – PPO | Admitting: Oncology

## 2020-04-10 ENCOUNTER — Inpatient Hospital Stay: Payer: BC Managed Care – PPO

## 2020-04-10 ENCOUNTER — Encounter: Payer: Self-pay | Admitting: *Deleted

## 2020-04-10 ENCOUNTER — Telehealth: Payer: Self-pay | Admitting: *Deleted

## 2020-04-10 ENCOUNTER — Other Ambulatory Visit: Payer: Self-pay

## 2020-04-10 VITALS — BP 124/91 | HR 82 | Temp 97.4°F | Resp 18 | Ht 67.0 in | Wt 145.2 lb

## 2020-04-10 DIAGNOSIS — C20 Malignant neoplasm of rectum: Secondary | ICD-10-CM

## 2020-04-10 DIAGNOSIS — Z95828 Presence of other vascular implants and grafts: Secondary | ICD-10-CM

## 2020-04-10 LAB — CBC WITH DIFFERENTIAL (CANCER CENTER ONLY)
Abs Immature Granulocytes: 0.04 10*3/uL (ref 0.00–0.07)
Basophils Absolute: 0 10*3/uL (ref 0.0–0.1)
Basophils Relative: 0 %
Eosinophils Absolute: 0 10*3/uL (ref 0.0–0.5)
Eosinophils Relative: 1 %
HCT: 32 % — ABNORMAL LOW (ref 36.0–46.0)
Hemoglobin: 10 g/dL — ABNORMAL LOW (ref 12.0–15.0)
Immature Granulocytes: 1 %
Lymphocytes Relative: 20 %
Lymphs Abs: 0.9 10*3/uL (ref 0.7–4.0)
MCH: 20.5 pg — ABNORMAL LOW (ref 26.0–34.0)
MCHC: 31.3 g/dL (ref 30.0–36.0)
MCV: 65.6 fL — ABNORMAL LOW (ref 80.0–100.0)
Monocytes Absolute: 0.7 10*3/uL (ref 0.1–1.0)
Monocytes Relative: 15 %
Neutro Abs: 2.9 10*3/uL (ref 1.7–7.7)
Neutrophils Relative %: 63 %
Platelet Count: 184 10*3/uL (ref 150–400)
RBC: 4.88 MIL/uL (ref 3.87–5.11)
RDW: 17.9 % — ABNORMAL HIGH (ref 11.5–15.5)
WBC Count: 4.6 10*3/uL (ref 4.0–10.5)
nRBC: 0 % (ref 0.0–0.2)

## 2020-04-10 LAB — CMP (CANCER CENTER ONLY)
ALT: 37 U/L (ref 0–44)
AST: 43 U/L — ABNORMAL HIGH (ref 15–41)
Albumin: 3.3 g/dL — ABNORMAL LOW (ref 3.5–5.0)
Alkaline Phosphatase: 199 U/L — ABNORMAL HIGH (ref 38–126)
Anion gap: 8 (ref 5–15)
BUN: 7 mg/dL (ref 6–20)
CO2: 27 mmol/L (ref 22–32)
Calcium: 8.7 mg/dL — ABNORMAL LOW (ref 8.9–10.3)
Chloride: 94 mmol/L — ABNORMAL LOW (ref 98–111)
Creatinine: 0.56 mg/dL (ref 0.44–1.00)
GFR, Est AFR Am: >60 mL/min (ref >60)
GFR, Estimated: >60 mL/min (ref >60)
Glucose, Bld: 88 mg/dL (ref 70–99)
Potassium: 4.4 mmol/L (ref 3.5–5.1)
Sodium: 129 mmol/L — ABNORMAL LOW (ref 135–145)
Total Bilirubin: 0.4 mg/dL (ref 0.3–1.2)
Total Protein: 5.7 g/dL — ABNORMAL LOW (ref 6.5–8.1)

## 2020-04-10 MED ORDER — HEPARIN SOD (PORK) LOCK FLUSH 100 UNIT/ML IV SOLN
500.0000 [IU] | Freq: Once | INTRAVENOUS | Status: AC
  Administered 2020-04-10: 500 [IU] via INTRAVENOUS
  Filled 2020-04-10: qty 5

## 2020-04-10 MED ORDER — SODIUM CHLORIDE 0.9% FLUSH
10.0000 mL | Freq: Once | INTRAVENOUS | Status: AC
  Administered 2020-04-10: 10 mL
  Filled 2020-04-10: qty 10

## 2020-04-10 MED ORDER — SODIUM CHLORIDE 0.9% FLUSH
10.0000 mL | INTRAVENOUS | Status: DC | PRN
  Administered 2020-04-10: 10 mL via INTRAVENOUS
  Filled 2020-04-10: qty 10

## 2020-04-10 NOTE — Telephone Encounter (Signed)
[facility] can't see patient till sometime in November. They recommended Dr. Frederik Schmidt at [phone]. Called practice and was able to get appointment on 04/28/20 at 10:15 pm. Only cost for consultation will be $169 for xrays and she needs to bring her dental insurance card. Faxed records to [phone] and listed Dr. Gearldine Shown phone # if MD wants to call him to discuss case. Requested she be placed on waiting list also in case of a cancellation. Called [facility] and left VM with appointment information and informed her all information including address of practice is in a Mychart message. Asked if she wants to start the [place] without the Avastin prior to seeing oral surgeon or afterwards? He is OK for either.

## 2020-04-10 NOTE — Progress Notes (Signed)
[facility] OFFICE PROGRESS NOTE   Diagnosis: Rectal cancer  INTERVAL HISTORY:   Tina Morales returns as scheduled.  She completed another cycle of FOLFOX on 03/20/2020.  Mild numbness in the feet.  This does not interfere with activity.  No numbness in the hands.  She has mild nausea and constipation/diarrhea following chemotherapy.  She has noted improvement in tooth pain since Avastin was discontinued.  She has not been evaluated by dental medicine.  Objective:  Vital signs in last 24 hours:  Blood pressure (!) 124/91, pulse 82, temperature (!) 97.4 F (36.3 C), temperature source Tympanic, resp. rate 18, height 5' 7"  (1.702 m), weight 145 lb 3.2 oz (65.9 kg), SpO2 100 %.    HEENT: Multiple missing, broken, and diseased teeth Resp: Lungs clear bilaterally Cardio: Regular rate and rhythm GI: No hepatosplenomegaly Vascular: No leg edema   Portacath/PICC-without erythema  Lab Results:  Lab Results  Component Value Date   WBC 4.6 04/10/2020   HGB 10.0 (L) 04/10/2020   HCT 32.0 (L) 04/10/2020   MCV 65.6 (L) 04/10/2020   PLT 184 04/10/2020   NEUTROABS 2.9 04/10/2020    CMP  Lab Results  Component Value Date   NA 129 (L) 04/10/2020   K 4.4 04/10/2020   CL 94 (L) 04/10/2020   CO2 27 04/10/2020   GLUCOSE 88 04/10/2020   BUN 7 04/10/2020   CREATININE 0.56 04/10/2020   CALCIUM 8.7 (L) 04/10/2020   PROT 5.7 (L) 04/10/2020   ALBUMIN 3.3 (L) 04/10/2020   AST 43 (H) 04/10/2020   ALT 37 04/10/2020   ALKPHOS 199 (H) 04/10/2020   BILITOT 0.4 04/10/2020   GFRNONAA >60 04/10/2020   GFRAA >60 04/10/2020    Lab Results  Component Value Date   CEA1 1.95 06/13/2019     Imaging:  CT images from 04/04/2020 reviewed with Tina Morales Medications: I have reviewed the patient's current medications.   Assessment/Plan: 1.  Rectal cancer-rectal mass noted on digital exam 02/10/2018, colonoscopy confirmed a him my circumferential mass in the rectum ? 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 CTs 02/10/2018-anterior rectal wall thickening, pulmonary nodules measuring up to 9 mm concerning for metastases, few round perirectal lymph nodes ? Pelvic MRI 02/20/2018-hypermetabolic low rectal mass extending to the posterior vagina with 2 small enlarged perirectal lymph nodes, T4b,N1-2.3 cm from the anal verge ? PET scan 02/23/2018-hypermetabolic rectal mass, 8 mm hypermetabolic lingular nodule, scattered small bilateral lung nodules, some calcified, a few with mildly increased activity ? Cycle 1 FOLFOXIRI8/21/2019 ? Cycle 5 FOLFOXIRI10/16/2019 ? CTs 05/01/2018-significant interval response to therapy with decreased size of the primary rectal mass lesion. Decreasing perirectal lymphadenopathy. Decreased and/or resolved pulmonary nodules. No new sites of disease identified. ? Cycle 6 FOLFOXIRI10/31/2019 ? Radiation/Xeloda 06/12/2018-completed 07/21/2018 ? Xeloda dose reduced 07/10/2018 due to mucositis and diarrhea ? CTs 08/07/2018-compared to 02/10/2018-resolved and decreased pulmonary nodules, few tiny residual noncalcified nodules, decreased 20 perirectal lymph nodes, soft tissue of the rectum indistinguishable from posterior wall of vagina-Vaginal involvement by tumor? ? 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 CT chest 09/29/2018-enlargement of lung nodules, not a candidate for SBRT based on discussion in GI tumor conference  and with radiation oncology ? CT chest 11/27/18 - interval growth of  numerous pulmonary metastases bilaterally compared to 09/29/18, no new pulmonary mets ? Cycle 1FOLFOXIRI 11/29/18 ? Cycle 2 FOLFOXIRI6/10/2018 ? Cycle 3 FOLFOXIRI 12/28/2018 ? Cycle 4 FOLFIRINOX 01/11/2019 ? CTs 01/24/2019-significant improvement of bilateral pulmonary metastases. ? Cycle 1 FOLFIRI 02/01/2019 ? Cycle 2 FOLFIRI 02/22/2019 ? Cycle 3 FOLFIRI 03/15/2019 ? Cycle 4 FOLFIRI 04/05/2019 ? Cycle 5 FOLFIRI 04/26/2019 ? CTs 05/14/2019-slight enlargement of right lung nodules, no other evidence of disease progression ? Cycle 6 FOLFIRI 05/17/2019 ? Cycle 7 FOLFIRI plus Avastin 06/13/2019 ? Cycle 8 FOLFIRI plus Avastin 07/09/2019 ? Cycle 9 FOLFIRI plus Avastin 07/25/2019 ? Cycle 10 FOLFIRI plus Avastin 08/09/2019 ? Cycle 11 FOLFIRI plus Avastin 08/22/2019 ? CTs 09/07/2019-most pulmonary nodules are stable. 1 nodule right lower lobe slightly larger. No new lung lesions. ? Cycle 12 FOLFIRI plus Avastin 09/13/2019 ? Cycle 13 FOLFIRI plus Avastin 10/04/2019 ? Cycle 14 FOLFIRI plus Avastin 10/25/2019 ? Cycle 15 FOLFIRI plus Avastin 11/14/2019 ? Cycle 16 FOLFIRI plus Avastin 12/06/2019 ? CTs neck, chest, abdomen, pelvis 12/21/2019-neck negative; mildly progressive pulmonary metastases; abdomen and pelvis negative for evidence of metastatic disease. ? Cycle 1 FOLFOX/Avastin 12/27/2019 ? Cycle 2 FOLFOX/Avastin 01/17/2020 ? Cycle 3 FOLFOX/Avastin 02/07/2020 ? Cycle 4 FOLFOX 02/28/2020, Avastin held due to need for dental evaluation possible extractions ? Cycle 5 FOLFOX 03/20/2020, Avastin held pending dental evaluation ? 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 Imodium as needed. 6.Mucositis secondary to Xeloda. Improved  07/19/2018. 7. History of migraines     Disposition: Tina Morales appears unchanged.  She has completed 5 cycles of salvage therapy with FOLFOX.  Avastin has been held for the past month secondary to planned dental extractions.  She has not been evaluated by dental medicine.  I reviewed the CT images and discussed treatment options with Tina Morales.  She has been treated with most of the available "standard "systemic therapy agents.  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  She is not a candidate for immunotherapy.  We discussed a treatment break, Lonsurf/Avastin, and referral for clinical trial.  She says that she cannot travel for a clinical trial.  She would like to continue treatment here.  We will contact dental medicine to discuss timing of dental extractions.  We will hold therapy if this can be done soon.  If not, the plan is to begin Lonsurf with Avastin to be added at a later date.  We reviewed potential toxicities associated with Lonsurf including the chance of hematologic toxicity, nausea, and diarrhea.   Betsy Coder, MD  04/10/2020  10:17 AM

## 2020-04-11 ENCOUNTER — Telehealth: Payer: Self-pay | Admitting: Oncology

## 2020-04-11 NOTE — Telephone Encounter (Signed)
Cancelled and scheduled appointments per 9/30 los. Patient is aware of upcoming appointment date and time and cancelled appointments.

## 2020-04-11 NOTE — Telephone Encounter (Signed)
Left 2nd voice mail asking for confirmation of receipt of Mychart message w/referral information with oral surgeon and when she wants to start [place].

## 2020-04-14 ENCOUNTER — Encounter: Payer: Self-pay | Admitting: *Deleted

## 2020-04-15 ENCOUNTER — Other Ambulatory Visit: Payer: Self-pay | Admitting: Nurse Practitioner

## 2020-04-15 DIAGNOSIS — C20 Malignant neoplasm of rectum: Secondary | ICD-10-CM

## 2020-04-16 ENCOUNTER — Other Ambulatory Visit: Payer: Self-pay | Admitting: Oncology

## 2020-04-16 ENCOUNTER — Telehealth: Payer: Self-pay

## 2020-04-16 ENCOUNTER — Telehealth: Payer: Self-pay | Admitting: Pharmacist

## 2020-04-16 DIAGNOSIS — C20 Malignant neoplasm of rectum: Secondary | ICD-10-CM

## 2020-04-16 MED ORDER — LONSURF 15-6.14 MG PO TABS
ORAL_TABLET | ORAL | 0 refills | Status: DC
Start: 2020-04-16 — End: 2020-04-16

## 2020-04-16 MED ORDER — LONSURF 15-6.14 MG PO TABS: ORAL_TABLET | ORAL | 0 refills | Status: DC

## 2020-04-16 NOTE — Telephone Encounter (Signed)
Oral Oncology Patient Advocate Encounter  Received notification from [facility] that prior authorization for [place] is required.  PA submitted on CoverMyMeds Key [id number] Status is pending  Oral Oncology Clinic will continue to follow.  [place] Patient [place] Phone [phone] Fax [phone] 04/16/2020 3:58 PM

## 2020-04-16 NOTE — Telephone Encounter (Signed)
Oral Oncology Pharmacist Encounter  Received new prescription for Lonsurf (trifluridine-tipiracil) for the treatment of rectal cancer in conjunction with bevacizumab (pending dental clearance), planned duration until disease progression or unacceptable drug toxicity.  Prescription dose and frequency assessed for appropriateness.  CBC w/ Diff and CMP from 04/10/20 assessed, labs OK for treatment initiation.  Current medication list in Epic reviewed, no significant/relevant DDIs with Lonsurf identified.  Evaluated chart and no patient barriers to medication adherence noted.   Prescription has been e-scribed to the [facility] for benefits analysis and approval.  Oral Oncology Clinic will continue to follow for insurance authorization, copayment issues, initial counseling and start date.  Leron Croak, PharmD, BCPS Hematology/Oncology Clinical Pharmacist [facility] [phone] 04/16/2020 3:53 PM

## 2020-04-21 MED ORDER — LONSURF 15-6.14 MG PO TABS: ORAL_TABLET | ORAL | 0 refills | Status: DC

## 2020-04-21 NOTE — Telephone Encounter (Signed)
Oral Oncology Patient Advocate Encounter  Prior Authorization for Frankey Poot has been approved.    PA# [id number] Effective dates: 04/20/20 through 07/21/20  Patient must fill at [facility] will continue to follow.   [place] Patient [place] Phone [phone] Fax [phone] 04/21/2020 8:21 AM

## 2020-04-21 NOTE — Telephone Encounter (Signed)
Oral Oncology Pharmacist Encounter  Prescription for Frankey Poot is required to be filled through [facility] per patient's insurance requirements. Prescription redirected to [facility].  Oral Oncology Clinic will continue to follow.   Leron Croak, PharmD, BCPS Hematology/Oncology Clinical Pharmacist [facility] [phone] 04/21/2020 9:59 AM

## 2020-04-25 NOTE — Telephone Encounter (Signed)
Oral Chemotherapy Pharmacist Encounter   Attempted to reach patient to provide update and offer for initial counseling on oral medication: Lonsurf (trifluridine-tipiracil).  No answer.  Left voicemail for patient to call back to discuss details of medication acquisition and initial counseling session.  Leron Croak, PharmD, BCPS Hematology/Oncology Clinical Pharmacist [facility] [phone] 04/25/2020 1:02 PM

## 2020-04-25 NOTE — Telephone Encounter (Signed)
Oral Chemotherapy Pharmacist Encounter   Spoke with representative from [facility] to check order status of Lonsurf for patient as well as medication copay. At this time [facility] was unable to provide copay. Request made to change order status to urgent/STAT to expedite medication acquisition and copay information for patient.  Spoke with Ms. Vanbeek and provided above information regarding status of medication. Patient has number to contact [place], but will follow-up with [facility] on 04/28/20 if medication delivery is not set up with patient at that time.   Patient knows to call the office with questions or concerns. Oral Oncology Clinic will continue to follow.   Leron Croak, PharmD, BCPS Hematology/Oncology Clinical Pharmacist [facility] [phone] 04/25/2020 2:31 PM

## 2020-04-29 ENCOUNTER — Encounter: Payer: Self-pay | Admitting: *Deleted

## 2020-04-29 ENCOUNTER — Telehealth: Payer: Self-pay | Admitting: *Deleted

## 2020-04-29 NOTE — Telephone Encounter (Addendum)
Reports she has seen Dr. Benson [place] and oral surgery is planned for 05/06/20. Will need to cancel her lab/flush/OV that day. When does she need to come in? Still has not had determination of cost of Lonsurf. She is asking if it is cost prohibitive for her would we return back to prior treatment of FOLFOX w/the Avastin added later after she heals from extractions? Sent message to E. Wynonia Lawman in pharmacy to f/u on cost of Lonsurf. Dr. Benay Spice said he would not go back to the FOLFOX treatment if she is not able to get the [place]. Notified patient via mychart as requested.

## 2020-05-01 ENCOUNTER — Ambulatory Visit: Payer: BC Managed Care – PPO

## 2020-05-01 ENCOUNTER — Other Ambulatory Visit: Payer: BC Managed Care – PPO

## 2020-05-01 ENCOUNTER — Ambulatory Visit: Payer: BC Managed Care – PPO | Admitting: Nurse Practitioner

## 2020-05-02 ENCOUNTER — Encounter: Payer: Self-pay | Admitting: *Deleted

## 2020-05-02 NOTE — Progress Notes (Signed)
Per oral chemotherapy pharmacist, patient was provided copay information re: Lonsurf. She wishes to wait until after her oral surgery issues are addressed before starting therapy. Sees oral surgeon on 10/26.

## 2020-05-02 NOTE — Telephone Encounter (Signed)
Oral Chemotherapy Pharmacist Encounter   Confirmed with patient that [facility] had provided her with copay for [place] (trifluridine-tipiracil) on 05/01/20.  Ms. Brashear states that she is about to undergo oral surgery and will follow-up with office thereafter regarding her plans for Lonsurf.   Patient knows to call the office with questions or concerns. Oral Oncology Clinic will continue to follow.   Leron Croak, PharmD, BCPS Hematology/Oncology Clinical Pharmacist [facility] [phone] 05/02/2020 10:05 AM

## 2020-05-06 ENCOUNTER — Ambulatory Visit: Payer: BC Managed Care – PPO | Admitting: Oncology

## 2020-05-06 ENCOUNTER — Other Ambulatory Visit: Payer: BC Managed Care – PPO

## 2020-05-06 ENCOUNTER — Ambulatory Visit: Payer: BC Managed Care – PPO | Admitting: Nurse Practitioner

## 2020-05-14 ENCOUNTER — Other Ambulatory Visit: Payer: Self-pay

## 2020-05-14 ENCOUNTER — Inpatient Hospital Stay: Payer: BC Managed Care – PPO | Attending: Oncology | Admitting: Oncology

## 2020-05-14 ENCOUNTER — Telehealth: Payer: Self-pay | Admitting: Oncology

## 2020-05-14 DIAGNOSIS — C7802 Secondary malignant neoplasm of left lung: Secondary | ICD-10-CM | POA: Diagnosis not present

## 2020-05-14 DIAGNOSIS — Z5111 Encounter for antineoplastic chemotherapy: Secondary | ICD-10-CM | POA: Diagnosis present

## 2020-05-14 DIAGNOSIS — C7801 Secondary malignant neoplasm of right lung: Secondary | ICD-10-CM | POA: Insufficient documentation

## 2020-05-14 DIAGNOSIS — C20 Malignant neoplasm of rectum: Secondary | ICD-10-CM | POA: Diagnosis not present

## 2020-05-14 DIAGNOSIS — Z79899 Other long term (current) drug therapy: Secondary | ICD-10-CM | POA: Diagnosis not present

## 2020-05-14 MED ORDER — DEXAMETHASONE 4 MG PO TABS: ORAL_TABLET | ORAL | 0 refills | Status: DC

## 2020-05-14 MED ORDER — ALPRAZOLAM 0.5 MG PO TABS: 0.5000 mg | ORAL_TABLET | Freq: Two times a day (BID) | ORAL | 1 refills | Status: DC | PRN

## 2020-05-14 NOTE — Progress Notes (Signed)
[facility] OFFICE PROGRESS NOTE   Diagnosis: Rectal cancer  INTERVAL HISTORY:   [name] returns after undergoing movable remaining teeth on 05/06/2020.  She is recovering from surgery.  She uses Tylenol and ibuprofen for pain. She reports anorexia since the last restaging CTs.  She has read about Lonsurf and does not wish to begin this at present.  She reports no neuropathy symptoms in the hands, minimal numbness in the feet.  Objective:  Vital signs in last 24 hours:  Blood pressure 106/81, pulse 97, temperature 98 F (36.7 C), temperature source Tympanic, resp. rate 17, height $RemoveBe'5\' 7"'iHwhvneXP$  (1.702 m), weight 141 lb 11.2 oz (64.3 kg), SpO2 100 %.    HEENT: Healing sites from tooth extractions, no bleeding, sutures in place Resp: Lungs clear bilaterally Cardio: Regular rate and rhythm GI: No hepatomegaly, left lower quadrant colostomy Vascular: No leg edema    Portacath/PICC-without erythema  Lab Results:  Lab Results  Component Value Date   WBC 4.6 04/10/2020   HGB 10.0 (L) 04/10/2020   HCT 32.0 (L) 04/10/2020   MCV 65.6 (L) 04/10/2020   PLT 184 04/10/2020   NEUTROABS 2.9 04/10/2020    CMP  Lab Results  Component Value Date   NA 129 (L) 04/10/2020   K 4.4 04/10/2020   CL 94 (L) 04/10/2020   CO2 27 04/10/2020   GLUCOSE 88 04/10/2020   BUN 7 04/10/2020   CREATININE 0.56 04/10/2020   CALCIUM 8.7 (L) 04/10/2020   PROT 5.7 (L) 04/10/2020   ALBUMIN 3.3 (L) 04/10/2020   AST 43 (H) 04/10/2020   ALT 37 04/10/2020   ALKPHOS 199 (H) 04/10/2020   BILITOT 0.4 04/10/2020   GFRNONAA >60 04/10/2020   GFRAA >60 04/10/2020    Lab Results  Component Value Date   CEA1 1.95 06/13/2019    Medications: I have reviewed the patient's current medications.   Assessment/Plan: 1.  Rectal cancer-rectal mass noted on digital exam 02/10/2018, colonoscopy confirmed a him my circumferential mass in the rectum ? 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 CTs 02/10/2018-anterior rectal wall thickening, pulmonary nodules measuring up to 9 mm concerning for metastases, few round perirectal lymph nodes ? Pelvic MRI 02/20/2018-hypermetabolic low rectal mass extending to the posterior vagina with 2 small enlarged perirectal lymph nodes, T4b,N1-2.3 cm from the anal verge ? PET scan 02/23/2018-hypermetabolic rectal mass, 8 mm hypermetabolic lingular nodule, scattered small bilateral lung nodules, some calcified, a few with mildly increased activity ? Cycle 1 FOLFOXIRI8/21/2019 ? Cycle 5 FOLFOXIRI10/16/2019 ? CTs 05/01/2018-significant interval response to therapy with decreased size of the primary rectal mass lesion. Decreasing perirectal lymphadenopathy. Decreased and/or resolved pulmonary nodules. No new sites of disease identified. ? Cycle 6 FOLFOXIRI10/31/2019 ? Radiation/Xeloda 06/12/2018-completed 07/21/2018 ? Xeloda dose reduced 07/10/2018 due to mucositis and diarrhea ? CTs 08/07/2018-compared to 02/10/2018-resolved and decreased pulmonary nodules, few tiny residual noncalcified nodules, decreased 20 perirectal lymph nodes, soft tissue of the rectum indistinguishable from posterior wall of vagina-Vaginal involvement by tumor? ? 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 CT chest 09/29/2018-enlargement of lung nodules, not a candidate for SBRT based on discussion in GI tumor conference and with radiation oncology ? CT  chest 11/27/18 - interval growth of numerous pulmonary metastases bilaterally compared to  09/29/18, no new pulmonary mets ? Cycle 1FOLFOXIRI 11/29/18 ? Cycle 2 FOLFOXIRI6/10/2018 ? Cycle 3 FOLFOXIRI 12/28/2018 ? Cycle 4 FOLFIRINOX 01/11/2019 ? CTs 01/24/2019-significant improvement of bilateral pulmonary metastases. ? Cycle 1 FOLFIRI 02/01/2019 ? Cycle 2 FOLFIRI 02/22/2019 ? Cycle 3 FOLFIRI 03/15/2019 ? Cycle 4 FOLFIRI 04/05/2019 ? Cycle 5 FOLFIRI 04/26/2019 ? CTs 05/14/2019-slight enlargement of right lung nodules, no other evidence of disease progression ? Cycle 6 FOLFIRI 05/17/2019 ? Cycle 7 FOLFIRI plus Avastin 06/13/2019 ? Cycle 8 FOLFIRI plus Avastin 07/09/2019 ? Cycle 9 FOLFIRI plus Avastin 07/25/2019 ? Cycle 10 FOLFIRI plus Avastin 08/09/2019 ? Cycle 11 FOLFIRI plus Avastin 08/22/2019 ? CTs 09/07/2019-most pulmonary nodules are stable. 1 nodule right lower lobe slightly larger. No new lung lesions. ? Cycle 12 FOLFIRI plus Avastin 09/13/2019 ? Cycle 13 FOLFIRI plus Avastin 10/04/2019 ? Cycle 14 FOLFIRI plus Avastin 10/25/2019 ? Cycle 15 FOLFIRI plus Avastin 11/14/2019 ? Cycle 16 FOLFIRI plus Avastin 12/06/2019 ? CTs neck, chest, abdomen, pelvis 12/21/2019-neck negative; mildly progressive pulmonary metastases; abdomen and pelvis negative for evidence of metastatic disease. ? Cycle 1 FOLFOX/Avastin 12/27/2019 ? Cycle 2 FOLFOX/Avastin 01/17/2020 ? Cycle 3 FOLFOX/Avastin 02/07/2020 ? Cycle 4 FOLFOX 02/28/2020, Avastin held due to need for dental evaluation possible extractions ? Cycle 5 FOLFOX 03/20/2020, Avastin held pending dental evaluation ? 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 Imodium as needed. 6.Mucositis secondary to Xeloda. Improved 07/19/2018. 7. History of  migraines      Disposition: Ms. Tafolla appears stable.  She underwent multiple tooth extractions last week.  We discussed systemic treatment options and reviewed the 04/04/2020 CT images again today.  She has been treated with an extensive course of chemotherapy including the "standard "agents available for treating colorectal cancer in the first-line setting.  We discussed salvage options including Lonsurf/Avastin and regorafenib.  We reviewed potential toxicities associated with Lonsurf.  She is not comfortable beginning Lonsurf at present.  She cannot receive Avastin for several more weeks following the recent tooth extractions.  She would like to resume FOLFOX chemotherapy.  She understands the small chance of clinical improvement with FOLFOX.  We reviewed the September CT images.  Several of the lung nodules have increased in size, but the increased does not appear dramatic.  She is willing to accept the potential for progressive neuropathy.  After lengthy discussion we decided to proceed with FOLFOX.  Avastin will be added with cycle 2.  She will have a baseline chest CT prior to resuming FOLFOX.  The plan is to resume FOLFOX chemotherapy on 05/22/2020.  She will be scheduled for an office visit and FOLFOX/Avastin on 06/12/2020.  Betsy Coder, MD  05/14/2020  8:52 AM

## 2020-05-14 NOTE — Telephone Encounter (Signed)
Scheduled per 11/3 los. Pt is aware of appt times and dates.

## 2020-05-21 ENCOUNTER — Ambulatory Visit (HOSPITAL_BASED_OUTPATIENT_CLINIC_OR_DEPARTMENT_OTHER)
Admission: RE | Admit: 2020-05-21 | Discharge: 2020-05-21 | Disposition: A | Discharge status: Home / Self Care | Payer: BC Managed Care – PPO | Source: Ambulatory Visit | Attending: Oncology | Admitting: Oncology

## 2020-05-21 ENCOUNTER — Other Ambulatory Visit: Payer: BC Managed Care – PPO

## 2020-05-21 ENCOUNTER — Other Ambulatory Visit: Payer: Self-pay

## 2020-05-21 DIAGNOSIS — C20 Malignant neoplasm of rectum: Secondary | ICD-10-CM | POA: Diagnosis present

## 2020-05-22 ENCOUNTER — Inpatient Hospital Stay: Payer: BC Managed Care – PPO

## 2020-05-22 ENCOUNTER — Other Ambulatory Visit: Payer: Self-pay

## 2020-05-22 ENCOUNTER — Other Ambulatory Visit: Payer: Self-pay | Admitting: *Deleted

## 2020-05-22 ENCOUNTER — Ambulatory Visit: Payer: BC Managed Care – PPO

## 2020-05-22 VITALS — BP 110/78 | HR 78 | Temp 98.2°F | Resp 18

## 2020-05-22 DIAGNOSIS — C20 Malignant neoplasm of rectum: Secondary | ICD-10-CM

## 2020-05-22 DIAGNOSIS — Z95828 Presence of other vascular implants and grafts: Secondary | ICD-10-CM

## 2020-05-22 LAB — CBC WITH DIFFERENTIAL (CANCER CENTER ONLY)
Abs Immature Granulocytes: 0.03 10*3/uL (ref 0.00–0.07)
Basophils Absolute: 0 10*3/uL (ref 0.0–0.1)
Basophils Relative: 0 %
Eosinophils Absolute: 0.1 10*3/uL (ref 0.0–0.5)
Eosinophils Relative: 1 %
HCT: 36.2 % (ref 36.0–46.0)
Hemoglobin: 11.3 g/dL — ABNORMAL LOW (ref 12.0–15.0)
Immature Granulocytes: 1 %
Lymphocytes Relative: 17 %
Lymphs Abs: 0.9 10*3/uL (ref 0.7–4.0)
MCH: 20.1 pg — ABNORMAL LOW (ref 26.0–34.0)
MCHC: 31.2 g/dL (ref 30.0–36.0)
MCV: 64.4 fL — ABNORMAL LOW (ref 80.0–100.0)
Monocytes Absolute: 0.5 10*3/uL (ref 0.1–1.0)
Monocytes Relative: 9 %
Neutro Abs: 4 10*3/uL (ref 1.7–7.7)
Neutrophils Relative %: 72 %
Platelet Count: 287 10*3/uL (ref 150–400)
RBC: 5.62 MIL/uL — ABNORMAL HIGH (ref 3.87–5.11)
RDW: 15.6 % — ABNORMAL HIGH (ref 11.5–15.5)
WBC Count: 5.5 10*3/uL (ref 4.0–10.5)
nRBC: 0 % (ref 0.0–0.2)

## 2020-05-22 LAB — CEA (IN HOUSE-CHCC): CEA (CHCC-In House): 3.58 ng/mL (ref 0.00–5.00)

## 2020-05-22 LAB — CMP (CANCER CENTER ONLY)
ALT: 21 U/L (ref 0–44)
AST: 32 U/L (ref 15–41)
Albumin: 3.8 g/dL (ref 3.5–5.0)
Alkaline Phosphatase: 192 U/L — ABNORMAL HIGH (ref 38–126)
Anion gap: 8 (ref 5–15)
BUN: 9 mg/dL (ref 6–20)
CO2: 25 mmol/L (ref 22–32)
Calcium: 9 mg/dL (ref 8.9–10.3)
Chloride: 97 mmol/L — ABNORMAL LOW (ref 98–111)
Creatinine: 0.61 mg/dL (ref 0.44–1.00)
GFR, Estimated: >60 mL/min (ref >60)
Glucose, Bld: 84 mg/dL (ref 70–99)
Potassium: 4.5 mmol/L (ref 3.5–5.1)
Sodium: 130 mmol/L — ABNORMAL LOW (ref 135–145)
Total Bilirubin: 0.5 mg/dL (ref 0.3–1.2)
Total Protein: 6.5 g/dL (ref 6.5–8.1)

## 2020-05-22 MED ORDER — SODIUM CHLORIDE 0.9 % IV SOLN
10.0000 mg | Freq: Once | INTRAVENOUS | Status: AC
  Administered 2020-05-22: 10 mg via INTRAVENOUS
  Filled 2020-05-22: qty 1
  Filled 2020-05-22: qty 10

## 2020-05-22 MED ORDER — ALTEPLASE 2 MG IJ SOLR
2.0000 mg | Freq: Once | INTRAMUSCULAR | Status: AC
  Administered 2020-05-22: 2 mg
  Filled 2020-05-22: qty 2

## 2020-05-22 MED ORDER — PALONOSETRON HCL INJECTION 0.25 MG/5ML
INTRAVENOUS | Status: AC
  Filled 2020-05-22: qty 5

## 2020-05-22 MED ORDER — PALONOSETRON HCL INJECTION 0.25 MG/5ML
0.2500 mg | Freq: Once | INTRAVENOUS | Status: AC
  Administered 2020-05-22: 0.25 mg via INTRAVENOUS

## 2020-05-22 MED ORDER — SODIUM CHLORIDE 0.9% FLUSH
10.0000 mL | Freq: Once | INTRAVENOUS | Status: AC
  Administered 2020-05-22: 10 mL
  Filled 2020-05-22: qty 10

## 2020-05-22 MED ORDER — SODIUM CHLORIDE 0.9 % IV SOLN
150.0000 mg | Freq: Once | INTRAVENOUS | Status: AC
  Administered 2020-05-22: 150 mg via INTRAVENOUS
  Filled 2020-05-22: qty 150
  Filled 2020-05-22: qty 5

## 2020-05-22 MED ORDER — LEUCOVORIN CALCIUM INJECTION 350 MG
200.0000 mg/m2 | Freq: Once | INTRAVENOUS | Status: AC
  Administered 2020-05-22: 318 mg via INTRAVENOUS
  Filled 2020-05-22: qty 15.9

## 2020-05-22 MED ORDER — SODIUM CHLORIDE 0.9 % IV SOLN
2000.0000 mg/m2 | INTRAVENOUS | Status: DC
  Administered 2020-05-22: 3200 mg via INTRAVENOUS
  Filled 2020-05-22: qty 64

## 2020-05-22 MED ORDER — HEPARIN SOD (PORK) LOCK FLUSH 100 UNIT/ML IV SOLN
500.0000 [IU] | Freq: Once | INTRAVENOUS | Status: DC | PRN
  Filled 2020-05-22: qty 5

## 2020-05-22 MED ORDER — OXALIPLATIN CHEMO INJECTION 100 MG/20ML
85.0000 mg/m2 | Freq: Once | INTRAVENOUS | Status: AC
  Administered 2020-05-22: 135 mg via INTRAVENOUS
  Filled 2020-05-22: qty 20

## 2020-05-22 MED ORDER — SODIUM CHLORIDE 0.9% FLUSH
10.0000 mL | INTRAVENOUS | Status: DC | PRN
  Filled 2020-05-22: qty 10

## 2020-05-22 MED ORDER — DEXTROSE 5 % IV SOLN
Freq: Once | INTRAVENOUS | Status: AC
  Filled 2020-05-22: qty 250

## 2020-05-22 NOTE — Patient Instructions (Signed)
[facility] Discharge Instructions for Patients Receiving Chemotherapy  Today you received the following chemotherapy agents: oxaliplatin, leucovorin, and fluorouracil.  To help prevent nausea and vomiting after your treatment, we encourage you to take your nausea medication as directed.   If you develop nausea and vomiting that is not controlled by your nausea medication, call the clinic.   BELOW ARE SYMPTOMS THAT SHOULD BE REPORTED IMMEDIATELY:  *FEVER GREATER THAN 100.5 F  *CHILLS WITH OR WITHOUT FEVER  NAUSEA AND VOMITING THAT IS NOT CONTROLLED WITH YOUR NAUSEA MEDICATION  *UNUSUAL SHORTNESS OF BREATH  *UNUSUAL BRUISING OR BLEEDING  TENDERNESS IN MOUTH AND THROAT WITH OR WITHOUT PRESENCE OF ULCERS  *URINARY PROBLEMS  *BOWEL PROBLEMS  UNUSUAL RASH Items with * indicate a potential emergency and should be followed up as soon as possible.  Feel free to call the clinic should you have any questions or concerns. The clinic phone number is [phone].  Please show the CHEMO ALERT CARD at check-in to the Emergency Department and triage nurse.   

## 2020-05-24 ENCOUNTER — Inpatient Hospital Stay: Payer: BC Managed Care – PPO

## 2020-05-24 ENCOUNTER — Other Ambulatory Visit: Payer: Self-pay

## 2020-05-24 VITALS — BP 99/72 | HR 60 | Temp 97.6°F | Resp 20

## 2020-05-24 DIAGNOSIS — C20 Malignant neoplasm of rectum: Secondary | ICD-10-CM | POA: Diagnosis not present

## 2020-05-24 DIAGNOSIS — Z95828 Presence of other vascular implants and grafts: Secondary | ICD-10-CM

## 2020-05-24 MED ORDER — HEPARIN SOD (PORK) LOCK FLUSH 100 UNIT/ML IV SOLN
500.0000 [IU] | Freq: Once | INTRAVENOUS | Status: AC | PRN
  Administered 2020-05-24: 500 [IU]
  Filled 2020-05-24: qty 5

## 2020-05-24 MED ORDER — SODIUM CHLORIDE 0.9% FLUSH
10.0000 mL | Freq: Once | INTRAVENOUS | Status: DC
  Filled 2020-05-24: qty 10

## 2020-05-24 MED ORDER — SODIUM CHLORIDE 0.9% FLUSH
10.0000 mL | INTRAVENOUS | Status: DC | PRN
  Administered 2020-05-24: 10 mL
  Filled 2020-05-24: qty 10

## 2020-05-24 MED ORDER — HEPARIN SOD (PORK) LOCK FLUSH 100 UNIT/ML IV SOLN
500.0000 [IU] | Freq: Once | INTRAVENOUS | Status: DC
  Filled 2020-05-24: qty 5

## 2020-05-26 ENCOUNTER — Telehealth: Payer: Self-pay | Admitting: Oncology

## 2020-05-26 ENCOUNTER — Telehealth: Payer: Self-pay | Admitting: *Deleted

## 2020-05-26 NOTE — Telephone Encounter (Signed)
Scheduled appt per 11/15 sch msg - pt to get an updated schedule next visit.

## 2020-05-26 NOTE — Telephone Encounter (Signed)
Called to request her next chemo be scheduled for 12/20 or 12/21 (or 12/27 or 12/28 if necessary) w/schedule getting tight during holiday. Sent scheduling message w/ request for lab/flush/OV/chemo appointments as requested.

## 2020-05-28 ENCOUNTER — Other Ambulatory Visit (HOSPITAL_COMMUNITY): Payer: BC Managed Care – PPO

## 2020-06-07 ENCOUNTER — Other Ambulatory Visit: Payer: Self-pay | Admitting: Oncology

## 2020-06-09 ENCOUNTER — Ambulatory Visit (HOSPITAL_COMMUNITY)
Admission: RE | Admit: 2020-06-09 | Discharge: 2020-06-09 | Disposition: A | Discharge status: Home / Self Care | Payer: BC Managed Care – PPO | Source: Ambulatory Visit | Attending: Oncology | Admitting: Oncology

## 2020-06-09 ENCOUNTER — Other Ambulatory Visit: Payer: Self-pay

## 2020-06-09 DIAGNOSIS — C20 Malignant neoplasm of rectum: Secondary | ICD-10-CM | POA: Insufficient documentation

## 2020-06-09 DIAGNOSIS — Z452 Encounter for adjustment and management of vascular access device: Secondary | ICD-10-CM | POA: Diagnosis present

## 2020-06-09 HISTORY — PX: IR CV LINE INJECTION: IMG2294

## 2020-06-09 MED ORDER — IOHEXOL 300 MG/ML  SOLN
50.0000 mL | Freq: Once | INTRAMUSCULAR | Status: AC | PRN
  Administered 2020-06-09: 8 mL via INTRAVENOUS

## 2020-06-09 MED ORDER — HEPARIN SOD (PORK) LOCK FLUSH 100 UNIT/ML IV SOLN
INTRAVENOUS | Status: AC
  Filled 2020-06-09: qty 5

## 2020-06-12 ENCOUNTER — Inpatient Hospital Stay: Payer: BC Managed Care – PPO | Attending: Oncology

## 2020-06-12 ENCOUNTER — Other Ambulatory Visit: Payer: Self-pay | Admitting: Oncology

## 2020-06-12 ENCOUNTER — Inpatient Hospital Stay: Payer: BC Managed Care – PPO

## 2020-06-12 ENCOUNTER — Other Ambulatory Visit: Payer: Self-pay

## 2020-06-12 ENCOUNTER — Inpatient Hospital Stay (HOSPITAL_BASED_OUTPATIENT_CLINIC_OR_DEPARTMENT_OTHER): Payer: BC Managed Care – PPO | Admitting: Oncology

## 2020-06-12 VITALS — BP 114/80 | HR 83 | Temp 97.9°F | Resp 17 | Ht 67.0 in | Wt 141.3 lb

## 2020-06-12 DIAGNOSIS — Z5111 Encounter for antineoplastic chemotherapy: Secondary | ICD-10-CM | POA: Insufficient documentation

## 2020-06-12 DIAGNOSIS — C7802 Secondary malignant neoplasm of left lung: Secondary | ICD-10-CM | POA: Insufficient documentation

## 2020-06-12 DIAGNOSIS — Z79899 Other long term (current) drug therapy: Secondary | ICD-10-CM | POA: Diagnosis not present

## 2020-06-12 DIAGNOSIS — Z5112 Encounter for antineoplastic immunotherapy: Secondary | ICD-10-CM | POA: Diagnosis present

## 2020-06-12 DIAGNOSIS — C7801 Secondary malignant neoplasm of right lung: Secondary | ICD-10-CM | POA: Insufficient documentation

## 2020-06-12 DIAGNOSIS — C20 Malignant neoplasm of rectum: Secondary | ICD-10-CM

## 2020-06-12 DIAGNOSIS — Z95828 Presence of other vascular implants and grafts: Secondary | ICD-10-CM

## 2020-06-12 LAB — TOTAL PROTEIN, URINE DIPSTICK: Protein, ur: NEGATIVE mg/dL

## 2020-06-12 LAB — CBC WITH DIFFERENTIAL (CANCER CENTER ONLY)
Abs Immature Granulocytes: 0.02 10*3/uL (ref 0.00–0.07)
Basophils Absolute: 0 10*3/uL (ref 0.0–0.1)
Basophils Relative: 1 %
Eosinophils Absolute: 0.1 10*3/uL (ref 0.0–0.5)
Eosinophils Relative: 2 %
HCT: 32.1 % — ABNORMAL LOW (ref 36.0–46.0)
Hemoglobin: 10 g/dL — ABNORMAL LOW (ref 12.0–15.0)
Immature Granulocytes: 1 %
Lymphocytes Relative: 16 %
Lymphs Abs: 0.6 10*3/uL — ABNORMAL LOW (ref 0.7–4.0)
MCH: 20.2 pg — ABNORMAL LOW (ref 26.0–34.0)
MCHC: 31.2 g/dL (ref 30.0–36.0)
MCV: 64.7 fL — ABNORMAL LOW (ref 80.0–100.0)
Monocytes Absolute: 0.5 10*3/uL (ref 0.1–1.0)
Monocytes Relative: 13 %
Neutro Abs: 2.8 10*3/uL (ref 1.7–7.7)
Neutrophils Relative %: 67 %
Platelet Count: 191 10*3/uL (ref 150–400)
RBC: 4.96 MIL/uL (ref 3.87–5.11)
RDW: 15.9 % — ABNORMAL HIGH (ref 11.5–15.5)
WBC Count: 4.1 10*3/uL (ref 4.0–10.5)
nRBC: 0 % (ref 0.0–0.2)

## 2020-06-12 LAB — CMP (CANCER CENTER ONLY)
ALT: 21 U/L (ref 0–44)
AST: 28 U/L (ref 15–41)
Albumin: 3.4 g/dL — ABNORMAL LOW (ref 3.5–5.0)
Alkaline Phosphatase: 144 U/L — ABNORMAL HIGH (ref 38–126)
Anion gap: 8 (ref 5–15)
BUN: 7 mg/dL (ref 6–20)
CO2: 22 mmol/L (ref 22–32)
Calcium: 8.7 mg/dL — ABNORMAL LOW (ref 8.9–10.3)
Chloride: 104 mmol/L (ref 98–111)
Creatinine: 0.56 mg/dL (ref 0.44–1.00)
GFR, Estimated: >60 mL/min
Glucose, Bld: 79 mg/dL (ref 70–99)
Potassium: 4.2 mmol/L (ref 3.5–5.1)
Sodium: 134 mmol/L — ABNORMAL LOW (ref 135–145)
Total Bilirubin: 0.3 mg/dL (ref 0.3–1.2)
Total Protein: 5.8 g/dL — ABNORMAL LOW (ref 6.5–8.1)

## 2020-06-12 LAB — FERRITIN: Ferritin: 114 ng/mL (ref 11–307)

## 2020-06-12 MED ORDER — SODIUM CHLORIDE 0.9 % IV SOLN
2000.0000 mg/m2 | INTRAVENOUS | Status: DC
  Administered 2020-06-12: 3200 mg via INTRAVENOUS
  Filled 2020-06-12: qty 64

## 2020-06-12 MED ORDER — DIPHENOXYLATE-ATROPINE 2.5-0.025 MG PO TABS: 2.0000 | ORAL_TABLET | Freq: Four times a day (QID) | ORAL | 1 refills | Status: AC | PRN

## 2020-06-12 MED ORDER — SODIUM CHLORIDE 0.9 % IV SOLN
10.0000 mg | Freq: Once | INTRAVENOUS | Status: AC
  Administered 2020-06-12: 10 mg via INTRAVENOUS
  Filled 2020-06-12: qty 10

## 2020-06-12 MED ORDER — DEXAMETHASONE 4 MG PO TABS: ORAL_TABLET | ORAL | 0 refills | Status: DC

## 2020-06-12 MED ORDER — SODIUM CHLORIDE 0.9 % IV SOLN
150.0000 mg | Freq: Once | INTRAVENOUS | Status: AC
  Administered 2020-06-12: 150 mg via INTRAVENOUS
  Filled 2020-06-12: qty 150

## 2020-06-12 MED ORDER — SODIUM CHLORIDE 0.9 % IV SOLN
Freq: Once | INTRAVENOUS | Status: AC
  Filled 2020-06-12: qty 250

## 2020-06-12 MED ORDER — LEUCOVORIN CALCIUM INJECTION 350 MG
200.0000 mg/m2 | Freq: Once | INTRAVENOUS | Status: AC
  Administered 2020-06-12: 318 mg via INTRAVENOUS
  Filled 2020-06-12: qty 15.9

## 2020-06-12 MED ORDER — PALONOSETRON HCL INJECTION 0.25 MG/5ML
0.2500 mg | Freq: Once | INTRAVENOUS | Status: AC
  Administered 2020-06-12: 0.25 mg via INTRAVENOUS

## 2020-06-12 MED ORDER — PALONOSETRON HCL INJECTION 0.25 MG/5ML
INTRAVENOUS | Status: AC
  Filled 2020-06-12: qty 5

## 2020-06-12 MED ORDER — PROCHLORPERAZINE MALEATE 10 MG PO TABS: 10.0000 mg | ORAL_TABLET | Freq: Four times a day (QID) | ORAL | 1 refills | Status: AC | PRN

## 2020-06-12 MED ORDER — ALPRAZOLAM 0.5 MG PO TABS: 0.5000 mg | ORAL_TABLET | Freq: Two times a day (BID) | ORAL | 1 refills | Status: DC | PRN

## 2020-06-12 MED ORDER — DEXTROSE 5 % IV SOLN
Freq: Once | INTRAVENOUS | Status: AC
  Filled 2020-06-12: qty 250

## 2020-06-12 MED ORDER — SODIUM CHLORIDE 0.9% FLUSH
10.0000 mL | Freq: Once | INTRAVENOUS | Status: AC
  Administered 2020-06-12: 10 mL
  Filled 2020-06-12: qty 10

## 2020-06-12 MED ORDER — OXALIPLATIN CHEMO INJECTION 100 MG/20ML
85.0000 mg/m2 | Freq: Once | INTRAVENOUS | Status: AC
  Administered 2020-06-12: 135 mg via INTRAVENOUS
  Filled 2020-06-12: qty 10

## 2020-06-12 MED ORDER — SODIUM CHLORIDE 0.9 % IV SOLN
7.5000 mg/kg | Freq: Once | INTRAVENOUS | Status: AC
  Administered 2020-06-12: 500 mg via INTRAVENOUS
  Filled 2020-06-12: qty 16

## 2020-06-12 NOTE — Patient Instructions (Signed)
[facility] Discharge Instructions for Patients Receiving Chemotherapy  Today you received the following chemotherapy agents: oxaliplatin, leucovorin, 5FU  To help prevent nausea and vomiting after your treatment, we encourage you to take your nausea medication as directed.    If you develop nausea and vomiting that is not controlled by your nausea medication, call the clinic.   BELOW ARE SYMPTOMS THAT SHOULD BE REPORTED IMMEDIATELY:  *FEVER GREATER THAN 100.5 F  *CHILLS WITH OR WITHOUT FEVER  NAUSEA AND VOMITING THAT IS NOT CONTROLLED WITH YOUR NAUSEA MEDICATION  *UNUSUAL SHORTNESS OF BREATH  *UNUSUAL BRUISING OR BLEEDING  TENDERNESS IN MOUTH AND THROAT WITH OR WITHOUT PRESENCE OF ULCERS  *URINARY PROBLEMS  *BOWEL PROBLEMS  UNUSUAL RASH Items with * indicate a potential emergency and should be followed up as soon as possible.  Feel free to call the clinic should you have any questions or concerns. The clinic phone number is [phone].  Please show the [place] at check-in to the Emergency Department and triage nurse.

## 2020-06-12 NOTE — Patient Instructions (Signed)

## 2020-06-12 NOTE — Progress Notes (Signed)
[place] OFFICE PROGRESS NOTE   Diagnosis: Rectal cancer  INTERVAL HISTORY:   Ms. Kubly completed a cycle of FOLFOX on 05/22/2020.  No nausea/vomiting or mouth sores.  The tooth extraction sites have healed.  She is now wearing dentures intermittently.  No change in neuropathy symptoms, chiefly affecting the feet. She has intermittent diarrhea and constipation.  Objective:  Vital signs in last 24 hours:  Blood pressure 114/80, pulse 83, temperature 97.9 F (36.6 C), temperature source Tympanic, resp. rate 17, height $RemoveBe'5\' 7"'bFUlPTybJ$  (1.702 m), weight 141 lb 4.8 oz (64.1 kg), SpO2 100 %.    HEENT: Healed tooth extraction sites, no thrush or ulcers Resp: Lungs clear bilaterally Cardio: Regular rate and rhythm GI: No hepatosplenomegaly, nontender, no mass, left lower quadrant colostomy Vascular: No leg edema Neuro: Mild loss of vibratory sense at the fingertips bilaterally    Portacath/PICC-without erythema  Lab Results:  Lab Results  Component Value Date   WBC 4.1 06/12/2020   HGB 10.0 (L) 06/12/2020   HCT 32.1 (L) 06/12/2020   MCV 64.7 (L) 06/12/2020   PLT 191 06/12/2020   NEUTROABS 2.8 06/12/2020    CMP  Lab Results  Component Value Date   NA 134 (L) 06/12/2020   K 4.2 06/12/2020   CL 104 06/12/2020   CO2 22 06/12/2020   GLUCOSE 79 06/12/2020   BUN 7 06/12/2020   CREATININE 0.56 06/12/2020   CALCIUM 8.7 (L) 06/12/2020   PROT 5.8 (L) 06/12/2020   ALBUMIN 3.4 (L) 06/12/2020   AST 28 06/12/2020   ALT 21 06/12/2020   ALKPHOS 144 (H) 06/12/2020   BILITOT 0.3 06/12/2020   GFRNONAA >60 06/12/2020   GFRAA >60 04/10/2020      Imaging:  IR CV Line Injection  Result Date: 06/09/2020 INDICATION: History of metastatic colon cancer now with intermittent difficulty aspirating from patient's port a catheter Patient's Port a catheter was placed several years ago at an outside institution. EXAM: FLUOROSCOPIC GUIDED PORT A CATHETER CHECK MEDICATIONS: None.  CONTRAST:  10 cc Omnipaque 300 FLUOROSCOPY TIME:  12 seconds (36 mGy) COMPLICATIONS: None immediate. TECHNIQUE: The procedure, risks, benefits, and alternatives were explained to the patient and informed written consent was obtained. A timeout was performed prior to the initiation of the procedure. The patient's chest port a catheter was accessed by the IR nursing staff. The patient was placed supine on the fluoroscopy table. A preprocedural spot fluoroscopic image was obtained of the chest in existing port a catheter. The port a Catheter was noted to easily aspirate and flush. Contrast was injected via the Port a catheter and images were reviewed. The Port a catheter was flushed with a heparin dwell and de accessed. A dressing was placed. The patient tolerated the procedure well without immediate postprocedural complication. FINDINGS: Unchanged positioning of right anterior chest wall jugular approach port a catheter with tip projected over the expected location of the distal SVC/superior cavoatrial junction. Port a Catheter was noted to easily aspirate and flush. Contrast injection demonstrated appropriate positioning and functionality of the right jugular approach port a catheter with tip terminating within the right superior cavoatrial junction. There is no definitive evidence of a fibrin sheath about the Port a catheter tip. There is no evidence of catheter kink or fracture. No contrast extravasation. Acquired images were reviewed and discussed with the patient. IMPRESSION: Appropriately positioned and functioning port a catheter without evidence of a fibrin sheath I explained that intermittent difficulty with Port a catheter aspiration could be  secondary to the end of the catheter abutting tricuspid valve. As such, I stated the patient could try lying supine if she were to experience difficulty with future port a catheter aspirations but otherwise, no additional intervention is required at this time. Patient  demonstrated excellent understanding the above plan of care. Electronically Signed   By: Sandi Mariscal M.D.   On: 06/09/2020 13:21    Medications: I have reviewed the patient's current medications.   Assessment/Plan: 1.  Rectal cancer-rectal mass noted on digital exam 02/10/2018, colonoscopy confirmed a him my circumferential mass in the rectum ? 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 CTs 02/10/2018-anterior rectal wall thickening, pulmonary nodules measuring up to 9 mm concerning for metastases, few round perirectal lymph nodes ? Pelvic MRI 02/20/2018-hypermetabolic low rectal mass extending to the posterior vagina with 2 small enlarged perirectal lymph nodes, T4b,N1-2.3 cm from the anal verge ? PET scan 02/23/2018-hypermetabolic rectal mass, 8 mm hypermetabolic lingular nodule, scattered small bilateral lung nodules, some calcified, a few with mildly increased activity ? Cycle 1 FOLFOXIRI8/21/2019 ? Cycle 5 FOLFOXIRI10/16/2019 ? CTs 05/01/2018-significant interval response to therapy with decreased size of the primary rectal mass lesion. Decreasing perirectal lymphadenopathy. Decreased and/or resolved pulmonary nodules. No new sites of disease identified. ? Cycle 6 FOLFOXIRI10/31/2019 ? Radiation/Xeloda 06/12/2018-completed 07/21/2018 ? Xeloda dose reduced 07/10/2018 due to mucositis and diarrhea ? CTs 08/07/2018-compared to 02/10/2018-resolved and decreased pulmonary nodules, few tiny residual noncalcified nodules, decreased 20 perirectal lymph nodes, soft tissue of the rectum indistinguishable from posterior wall of vagina-Vaginal involvement by tumor? ? 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 CT chest 09/29/2018-enlargement of lung nodules, not a candidate for SBRT based on discussion in GI tumor conference and with radiation oncology ? CT chest 11/27/18 - interval growth of numerous pulmonary metastases bilaterally compared to 09/29/18, no new pulmonary mets ? Cycle 1FOLFOXIRI 11/29/18 ? Cycle 2 FOLFOXIRI6/10/2018 ? Cycle 3 FOLFOXIRI 12/28/2018 ? Cycle 4 FOLFIRINOX 01/11/2019 ? CTs 01/24/2019-significant improvement of bilateral pulmonary metastases. ? Cycle 1 FOLFIRI 02/01/2019 ? Cycle 2 FOLFIRI 02/22/2019 ? Cycle 3 FOLFIRI 03/15/2019 ? Cycle 4 FOLFIRI 04/05/2019 ? Cycle 5 FOLFIRI 04/26/2019 ? CTs 05/14/2019-slight enlargement of right lung nodules, no other evidence of disease progression ? Cycle 6 FOLFIRI 05/17/2019 ? Cycle 7 FOLFIRI plus Avastin 06/13/2019 ? Cycle 8 FOLFIRI plus Avastin 07/09/2019 ? Cycle 9 FOLFIRI plus Avastin 07/25/2019 ? Cycle 10 FOLFIRI plus Avastin 08/09/2019 ? Cycle 11 FOLFIRI plus Avastin 08/22/2019 ? CTs 09/07/2019-most pulmonary nodules are stable. 1 nodule right lower lobe slightly larger. No new lung lesions. ? Cycle 12 FOLFIRI plus Avastin 09/13/2019 ? Cycle 13 FOLFIRI plus Avastin 10/04/2019 ? Cycle 14 FOLFIRI plus Avastin 10/25/2019 ? Cycle 15 FOLFIRI plus Avastin 11/14/2019 ? Cycle 16 FOLFIRI plus Avastin 12/06/2019 ? CTs neck, chest, abdomen, pelvis 12/21/2019-neck negative; mildly progressive pulmonary metastases; abdomen and pelvis negative for evidence of metastatic disease. ? Cycle 1 FOLFOX/Avastin 12/27/2019 ? Cycle 2 FOLFOX/Avastin 01/17/2020 ? Cycle 3 FOLFOX/Avastin 02/07/2020 ? Cycle 4 FOLFOX 02/28/2020, Avastin held due to need for dental evaluation possible extractions ? Cycle 5 FOLFOX 03/20/2020, Avastin held pending dental evaluation ? CTs 04/04/2020-increased size of pulmonary  nodules, no evidence of  metastatic disease in the abdomen or pelvis ? Cycle 6 FOLFOX 05/22/2020, Avastin held secondary to dental extractions ? 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 Imodium as needed. 6.Mucositis secondary to Xeloda. Improved 07/19/2018. 7. History of migraines 8.  Total odontectomy 05/06/2020    Disposition: Tina Morales appears stable.  She tolerated the last cycle of FOLFOX well.  She will complete another cycle today.  Avastin will be resumed.  We discussed the treatment plan today.  She will complete a total of 10 cycles of FOLFOX prior to a restaging CT evaluation.  She will return for an office visit in the next cycle of chemotherapy on 06/30/2020.  Betsy Coder, MD  06/12/2020  10:14 AM

## 2020-06-12 NOTE — Progress Notes (Signed)
Patient in no distress at discharge. Informed to return around 1pm on Saturday 12/4 for pump d/c. Ambulated independently to exit.

## 2020-06-13 ENCOUNTER — Telehealth: Payer: Self-pay | Admitting: Oncology

## 2020-06-13 NOTE — Telephone Encounter (Signed)
Scheduled appointments per 12/2 los. Spoke to patient who is aware of appointments dates and times.  

## 2020-06-14 ENCOUNTER — Other Ambulatory Visit: Payer: Self-pay

## 2020-06-14 ENCOUNTER — Inpatient Hospital Stay: Payer: BC Managed Care – PPO

## 2020-06-14 VITALS — BP 137/63 | HR 88 | Temp 98.6°F | Resp 20

## 2020-06-14 DIAGNOSIS — C20 Malignant neoplasm of rectum: Secondary | ICD-10-CM

## 2020-06-14 MED ORDER — SODIUM CHLORIDE 0.9% FLUSH
10.0000 mL | INTRAVENOUS | Status: DC | PRN
  Administered 2020-06-14: 10 mL
  Filled 2020-06-14: qty 10

## 2020-06-14 MED ORDER — HEPARIN SOD (PORK) LOCK FLUSH 100 UNIT/ML IV SOLN
500.0000 [IU] | Freq: Once | INTRAVENOUS | Status: AC | PRN
  Administered 2020-06-14: 500 [IU]
  Filled 2020-06-14: qty 5

## 2020-06-27 ENCOUNTER — Other Ambulatory Visit: Payer: Self-pay | Admitting: *Deleted

## 2020-06-27 DIAGNOSIS — C20 Malignant neoplasm of rectum: Secondary | ICD-10-CM

## 2020-06-29 ENCOUNTER — Other Ambulatory Visit: Payer: Self-pay | Admitting: Oncology

## 2020-06-30 ENCOUNTER — Inpatient Hospital Stay: Payer: BC Managed Care – PPO

## 2020-06-30 ENCOUNTER — Inpatient Hospital Stay (HOSPITAL_BASED_OUTPATIENT_CLINIC_OR_DEPARTMENT_OTHER): Payer: BC Managed Care – PPO | Admitting: Oncology

## 2020-06-30 ENCOUNTER — Other Ambulatory Visit: Payer: Self-pay | Admitting: *Deleted

## 2020-06-30 ENCOUNTER — Other Ambulatory Visit: Payer: Self-pay

## 2020-06-30 ENCOUNTER — Encounter: Payer: Self-pay | Admitting: *Deleted

## 2020-06-30 VITALS — BP 132/90 | HR 94 | Temp 97.8°F | Resp 18 | Ht 67.0 in | Wt 138.1 lb

## 2020-06-30 DIAGNOSIS — C20 Malignant neoplasm of rectum: Secondary | ICD-10-CM

## 2020-06-30 DIAGNOSIS — Z95828 Presence of other vascular implants and grafts: Secondary | ICD-10-CM

## 2020-06-30 LAB — CBC WITH DIFFERENTIAL (CANCER CENTER ONLY)
Abs Immature Granulocytes: 0.02 10*3/uL (ref 0.00–0.07)
Basophils Absolute: 0 10*3/uL (ref 0.0–0.1)
Basophils Relative: 1 %
Eosinophils Absolute: 0 10*3/uL (ref 0.0–0.5)
Eosinophils Relative: 1 %
HCT: 34.8 % — ABNORMAL LOW (ref 36.0–46.0)
Hemoglobin: 10.9 g/dL — ABNORMAL LOW (ref 12.0–15.0)
Immature Granulocytes: 1 %
Lymphocytes Relative: 21 %
Lymphs Abs: 0.7 10*3/uL (ref 0.7–4.0)
MCH: 20.3 pg — ABNORMAL LOW (ref 26.0–34.0)
MCHC: 31.3 g/dL (ref 30.0–36.0)
MCV: 64.8 fL — ABNORMAL LOW (ref 80.0–100.0)
Monocytes Absolute: 0.5 10*3/uL (ref 0.1–1.0)
Monocytes Relative: 15 %
Neutro Abs: 2.1 10*3/uL (ref 1.7–7.7)
Neutrophils Relative %: 61 %
Platelet Count: 184 10*3/uL (ref 150–400)
RBC: 5.37 MIL/uL — ABNORMAL HIGH (ref 3.87–5.11)
RDW: 17.3 % — ABNORMAL HIGH (ref 11.5–15.5)
WBC Count: 3.4 10*3/uL — ABNORMAL LOW (ref 4.0–10.5)
nRBC: 0 % (ref 0.0–0.2)

## 2020-06-30 LAB — CMP (CANCER CENTER ONLY)
ALT: 39 U/L (ref 0–44)
AST: 45 U/L — ABNORMAL HIGH (ref 15–41)
Albumin: 3.7 g/dL (ref 3.5–5.0)
Alkaline Phosphatase: 193 U/L — ABNORMAL HIGH (ref 38–126)
Anion gap: 7 (ref 5–15)
BUN: 5 mg/dL — ABNORMAL LOW (ref 6–20)
CO2: 26 mmol/L (ref 22–32)
Calcium: 9.3 mg/dL (ref 8.9–10.3)
Chloride: 102 mmol/L (ref 98–111)
Creatinine: 0.58 mg/dL (ref 0.44–1.00)
GFR, Estimated: >60 mL/min (ref >60)
Glucose, Bld: 95 mg/dL (ref 70–99)
Potassium: 4.3 mmol/L (ref 3.5–5.1)
Sodium: 135 mmol/L (ref 135–145)
Total Bilirubin: 0.6 mg/dL (ref 0.3–1.2)
Total Protein: 6.5 g/dL (ref 6.5–8.1)

## 2020-06-30 MED ORDER — LEUCOVORIN CALCIUM INJECTION 350 MG
200.0000 mg/m2 | Freq: Once | INTRAVENOUS | Status: AC
  Administered 2020-06-30: 14:00:00 318 mg via INTRAVENOUS
  Filled 2020-06-30: qty 15.9

## 2020-06-30 MED ORDER — SODIUM CHLORIDE 0.9% FLUSH
10.0000 mL | INTRAVENOUS | Status: DC | PRN
  Filled 2020-06-30: qty 10

## 2020-06-30 MED ORDER — SODIUM CHLORIDE 0.9 % IV SOLN
2000.0000 mg/m2 | INTRAVENOUS | Status: DC
  Administered 2020-06-30: 16:00:00 3200 mg via INTRAVENOUS
  Filled 2020-06-30: qty 64

## 2020-06-30 MED ORDER — POTASSIUM CHLORIDE CRYS ER 20 MEQ PO TBCR: 20.0000 meq | EXTENDED_RELEASE_TABLET | Freq: Two times a day (BID) | ORAL | 1 refills | Status: DC

## 2020-06-30 MED ORDER — LIDOCAINE-PRILOCAINE 2.5-2.5 % EX CREA: 1.0000 "application " | TOPICAL_CREAM | CUTANEOUS | 1 refills | Status: AC | PRN

## 2020-06-30 MED ORDER — PALONOSETRON HCL INJECTION 0.25 MG/5ML
INTRAVENOUS | Status: AC
  Filled 2020-06-30: qty 5

## 2020-06-30 MED ORDER — SODIUM CHLORIDE 0.9 % IV SOLN
150.0000 mg | Freq: Once | INTRAVENOUS | Status: AC
  Administered 2020-06-30: 13:00:00 150 mg via INTRAVENOUS
  Filled 2020-06-30: qty 150

## 2020-06-30 MED ORDER — PALONOSETRON HCL INJECTION 0.25 MG/5ML
0.2500 mg | Freq: Once | INTRAVENOUS | Status: AC
  Administered 2020-06-30: 12:00:00 0.25 mg via INTRAVENOUS

## 2020-06-30 MED ORDER — SODIUM CHLORIDE 0.9 % IV SOLN
10.0000 mg | Freq: Once | INTRAVENOUS | Status: AC
  Administered 2020-06-30: 12:00:00 10 mg via INTRAVENOUS
  Filled 2020-06-30: qty 10

## 2020-06-30 MED ORDER — DEXTROSE 5 % IV SOLN
Freq: Once | INTRAVENOUS | Status: AC
  Filled 2020-06-30: qty 250

## 2020-06-30 MED ORDER — SODIUM CHLORIDE 0.9 % IV SOLN
7.5000 mg/kg | Freq: Once | INTRAVENOUS | Status: AC
  Administered 2020-06-30: 13:00:00 500 mg via INTRAVENOUS
  Filled 2020-06-30: qty 16

## 2020-06-30 MED ORDER — SODIUM CHLORIDE 0.9% FLUSH
10.0000 mL | Freq: Once | INTRAVENOUS | Status: AC
  Administered 2020-06-30: 11:00:00 10 mL
  Filled 2020-06-30: qty 10

## 2020-06-30 MED ORDER — SODIUM CHLORIDE 0.9 % IV SOLN
Freq: Once | INTRAVENOUS | Status: AC
  Filled 2020-06-30: qty 250

## 2020-06-30 MED ORDER — OXALIPLATIN CHEMO INJECTION 100 MG/20ML
85.0000 mg/m2 | Freq: Once | INTRAVENOUS | Status: AC
  Administered 2020-06-30: 14:00:00 135 mg via INTRAVENOUS
  Filled 2020-06-30: qty 27

## 2020-06-30 NOTE — Patient Instructions (Signed)
[facility] Discharge Instructions for Patients Receiving Chemotherapy  Today you received the following chemotherapy agents: bevacizumab, oxaliplatin, leucovorin, 5FU  To help prevent nausea and vomiting after your treatment, we encourage you to take your nausea medication as directed.   If you develop nausea and vomiting that is not controlled by your nausea medication, call the clinic.   BELOW ARE SYMPTOMS THAT SHOULD BE REPORTED IMMEDIATELY:  *FEVER GREATER THAN 100.5 F  *CHILLS WITH OR WITHOUT FEVER  NAUSEA AND VOMITING THAT IS NOT CONTROLLED WITH YOUR NAUSEA MEDICATION  *UNUSUAL SHORTNESS OF BREATH  *UNUSUAL BRUISING OR BLEEDING  TENDERNESS IN MOUTH AND THROAT WITH OR WITHOUT PRESENCE OF ULCERS  *URINARY PROBLEMS  *BOWEL PROBLEMS  UNUSUAL RASH Items with * indicate a potential emergency and should be followed up as soon as possible.  Feel free to call the clinic should you have any questions or concerns. The clinic phone number is [phone].  Please show the CHEMO ALERT CARD at check-in to the Emergency Department and triage nurse.   

## 2020-06-30 NOTE — Progress Notes (Deleted)
[place] OFFICE PROGRESS NOTE   Diagnosis:   INTERVAL HISTORY:   ***  Objective:  Vital signs in last 24 hours:  Blood pressure 132/90, pulse 94, temperature 97.8 F (36.6 C), temperature source Tympanic, resp. rate 18, height 5' 7" (1.702 m), weight 138 lb 1.6 oz (62.6 kg), SpO2 99 %.    HEENT: *** Lymphatics: *** Resp: *** Cardio: *** GI: *** Vascular: *** Neuro:***  Skin:***   Portacath/PICC-without erythema  Lab Results:  Lab Results  Component Value Date   WBC 3.4 (L) 06/30/2020   HGB 10.9 (L) 06/30/2020   HCT 34.8 (L) 06/30/2020   MCV 64.8 (L) 06/30/2020   PLT 184 06/30/2020   NEUTROABS 2.1 06/30/2020    CMP  Lab Results  Component Value Date   NA 134 (L) 06/12/2020   K 4.2 06/12/2020   CL 104 06/12/2020   CO2 22 06/12/2020   GLUCOSE 79 06/12/2020   BUN 7 06/12/2020   CREATININE 0.56 06/12/2020   CALCIUM 8.7 (L) 06/12/2020   PROT 5.8 (L) 06/12/2020   ALBUMIN 3.4 (L) 06/12/2020   AST 28 06/12/2020   ALT 21 06/12/2020   ALKPHOS 144 (H) 06/12/2020   BILITOT 0.3 06/12/2020   GFRNONAA >60 06/12/2020   GFRAA >60 04/10/2020    Lab Results  Component Value Date   CEA1 3.58 05/22/2020    Lab Results  Component Value Date   INR 1.08 08/30/2018    Imaging:  No results found.  Medications: I have reviewed the patient's current medications.   Assessment/Plan: 1.  Rectal cancer-rectal mass noted on digital exam 02/10/2018, colonoscopy confirmed a him my circumferential mass in the rectum ? 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 CTs 02/10/2018-anterior rectal wall thickening, pulmonary nodules measuring up to 9 mm concerning for metastases, few round perirectal lymph nodes ? Pelvic MRI  02/20/2018-hypermetabolic low rectal mass extending to the posterior vagina with 2 small enlarged perirectal lymph nodes, T4b,N1-2.3 cm from the anal verge ? PET scan 02/23/2018-hypermetabolic rectal mass, 8 mm hypermetabolic lingular nodule, scattered small bilateral lung nodules, some calcified, a few with mildly increased activity ? Cycle 1 FOLFOXIRI8/21/2019 ? Cycle 5 FOLFOXIRI10/16/2019 ? CTs 05/01/2018-significant interval response to therapy with decreased size of the primary rectal mass lesion. Decreasing perirectal lymphadenopathy. Decreased and/or resolved pulmonary nodules. No new sites of disease identified. ? Cycle 6 FOLFOXIRI10/31/2019 ? Radiation/Xeloda 06/12/2018-completed 07/21/2018 ? Xeloda dose reduced 07/10/2018 due to mucositis and diarrhea ? CTs 08/07/2018-compared to 02/10/2018-resolved and decreased pulmonary nodules, few tiny residual noncalcified nodules, decreased 20 perirectal lymph nodes, soft tissue of the rectum indistinguishable from posterior wall of vagina-Vaginal involvement by tumor? ? 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 CT chest 09/29/2018-enlargement of lung nodules, not a candidate for SBRT based on discussion in GI tumor conference and with radiation oncology ? CT chest 11/27/18 - interval growth of numerous pulmonary metastases bilaterally compared to 09/29/18, no new pulmonary mets ? Cycle 1FOLFOXIRI 11/29/18 ? Cycle 2 FOLFOXIRI6/10/2018 ? Cycle 3 FOLFOXIRI 12/28/2018 ? Cycle 4 FOLFIRINOX 01/11/2019 ? CTs 01/24/2019-significant improvement of bilateral pulmonary metastases. ? Cycle 1 FOLFIRI 02/01/2019 ? Cycle 2 FOLFIRI 02/22/2019 ? Cycle 3 FOLFIRI 03/15/2019 ?  Cycle 4 FOLFIRI 04/05/2019 ? Cycle 5 FOLFIRI 04/26/2019 ? CTs  05/14/2019-slight enlargement of right lung nodules, no other evidence of disease progression ? Cycle 6 FOLFIRI 05/17/2019 ? Cycle 7 FOLFIRI plus Avastin 06/13/2019 ? Cycle 8 FOLFIRI plus Avastin 07/09/2019 ? Cycle 9 FOLFIRI plus Avastin 07/25/2019 ? Cycle 10 FOLFIRI plus Avastin 08/09/2019 ? Cycle 11 FOLFIRI plus Avastin 08/22/2019 ? CTs 09/07/2019-most pulmonary nodules are stable. 1 nodule right lower lobe slightly larger. No new lung lesions. ? Cycle 12 FOLFIRI plus Avastin 09/13/2019 ? Cycle 13 FOLFIRI plus Avastin 10/04/2019 ? Cycle 14 FOLFIRI plus Avastin 10/25/2019 ? Cycle 15 FOLFIRI plus Avastin 11/14/2019 ? Cycle 16 FOLFIRI plus Avastin 12/06/2019 ? CTs neck, chest, abdomen, pelvis 12/21/2019-neck negative; mildly progressive pulmonary metastases; abdomen and pelvis negative for evidence of metastatic disease. ? Cycle 1 FOLFOX/Avastin 12/27/2019 ? Cycle 2 FOLFOX/Avastin 01/17/2020 ? Cycle 3 FOLFOX/Avastin 02/07/2020 ? Cycle 4 FOLFOX 02/28/2020, Avastin held due to need for dental evaluation possible extractions ? Cycle 5 FOLFOX 03/20/2020, Avastin held pending dental evaluation ? CTs 04/04/2020-increased size of pulmonary nodules, no evidence of metastatic disease in the abdomen or pelvis ? Cycle 6 FOLFOX 05/22/2020, Avastin held secondary to dental extractions ? 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 Imodium as needed. 6.Mucositis secondary to Xeloda. Improved 07/19/2018. 7. History of migraines 8.  Total odontectomy 05/06/2020      Disposition: ***  Betsy Coder, MD  06/30/2020  11:35 AM

## 2020-06-30 NOTE — Progress Notes (Signed)
Patient stated that take home dressing usually irritates her skin. biopatch and sensitive dressing applied for taking pump home today.

## 2020-06-30 NOTE — Progress Notes (Signed)
[facility] OFFICE PROGRESS NOTE   Diagnosis: Rectal cancer  INTERVAL HISTORY:   Tina Morales completed another cycle of FOLFOX/Avastin on 06/12/2020.  She has alternating constipation and diarrhea following chemotherapy.  No mouth sores or vomiting.  There is been mild progression of numbness in the feet.  This does not interfere with activity.  No numbness in the hands.  She is getting used to wearing dentures.  No bleeding or symptom of thrombosis.  Objective:  Vital signs in last 24 hours:  Blood pressure 132/90, pulse 94, temperature 97.8 F (36.6 C), temperature source Tympanic, resp. rate 18, height 5' 7" (1.702 m), weight 138 lb 1.6 oz (62.6 kg), SpO2 99 %.    HEENT: No thrush or ulcers Resp: Lungs clear bilaterally Cardio: Regular rate and rhythm GI: No hepatomegaly, left lower quadrant colostomy with brown stool Vascular: No leg edema    Portacath/PICC-without erythema  Lab Results:  Lab Results  Component Value Date   WBC 3.4 (L) 06/30/2020   HGB 10.9 (L) 06/30/2020   HCT 34.8 (L) 06/30/2020   MCV 64.8 (L) 06/30/2020   PLT 184 06/30/2020   NEUTROABS 2.1 06/30/2020    CMP  Lab Results  Component Value Date   NA 134 (L) 06/12/2020   K 4.2 06/12/2020   CL 104 06/12/2020   CO2 22 06/12/2020   GLUCOSE 79 06/12/2020   BUN 7 06/12/2020   CREATININE 0.56 06/12/2020   CALCIUM 8.7 (L) 06/12/2020   PROT 5.8 (L) 06/12/2020   ALBUMIN 3.4 (L) 06/12/2020   AST 28 06/12/2020   ALT 21 06/12/2020   ALKPHOS 144 (H) 06/12/2020   BILITOT 0.3 06/12/2020   GFRNONAA >60 06/12/2020   GFRAA >60 04/10/2020    Lab Results  Component Value Date   CEA1 3.58 05/22/2020    Medications: I have reviewed the patient's current medications.   Assessment/Plan: 1.  Rectal cancer-rectal mass noted on digital exam 02/10/2018, colonoscopy confirmed a him my circumferential mass in the rectum ? 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 CTs 02/10/2018-anterior rectal wall thickening, pulmonary nodules measuring up to 9 mm concerning for metastases, few round perirectal lymph nodes ? Pelvic MRI 02/20/2018-hypermetabolic low rectal mass extending to the posterior vagina with 2 small enlarged perirectal lymph nodes, T4b,N1-2.3 cm from the anal verge ? PET scan 02/23/2018-hypermetabolic rectal mass, 8 mm hypermetabolic lingular nodule, scattered small bilateral lung nodules, some calcified, a few with mildly increased activity ? Cycle 1 FOLFOXIRI8/21/2019 ? Cycle 5 FOLFOXIRI10/16/2019 ? CTs 05/01/2018-significant interval response to therapy with decreased size of the primary rectal mass lesion. Decreasing perirectal lymphadenopathy. Decreased and/or resolved pulmonary nodules. No new sites of disease identified. ? Cycle 6 FOLFOXIRI10/31/2019 ? Radiation/Xeloda 06/12/2018-completed 07/21/2018 ? Xeloda dose reduced 07/10/2018 due to mucositis and diarrhea ? CTs 08/07/2018-compared to 02/10/2018-resolved and decreased pulmonary nodules, few tiny residual noncalcified nodules, decreased 20 perirectal lymph nodes, soft tissue of the rectum indistinguishable from posterior wall of vagina-Vaginal involvement by tumor? ? 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 CT chest 09/29/2018-enlargement of lung nodules, not a candidate for SBRT based on discussion in GI tumor conference and with radiation oncology ? CT chest 11/27/18 -   interval growth of numerous pulmonary metastases bilaterally compared to 09/29/18, no new  pulmonary mets ? Cycle 1FOLFOXIRI 11/29/18 ? Cycle 2 FOLFOXIRI6/10/2018 ? Cycle 3 FOLFOXIRI 12/28/2018 ? Cycle 4 FOLFIRINOX 01/11/2019 ? CTs 01/24/2019-significant improvement of bilateral pulmonary metastases. ? Cycle 1 FOLFIRI 02/01/2019 ? Cycle 2 FOLFIRI 02/22/2019 ? Cycle 3 FOLFIRI 03/15/2019 ? Cycle 4 FOLFIRI 04/05/2019 ? Cycle 5 FOLFIRI 04/26/2019 ? CTs 05/14/2019-slight enlargement of right lung nodules, no other evidence of disease progression ? Cycle 6 FOLFIRI 05/17/2019 ? Cycle 7 FOLFIRI plus Avastin 06/13/2019 ? Cycle 8 FOLFIRI plus Avastin 07/09/2019 ? Cycle 9 FOLFIRI plus Avastin 07/25/2019 ? Cycle 10 FOLFIRI plus Avastin 08/09/2019 ? Cycle 11 FOLFIRI plus Avastin 08/22/2019 ? CTs 09/07/2019-most pulmonary nodules are stable. 1 nodule right lower lobe slightly larger. No new lung lesions. ? Cycle 12 FOLFIRI plus Avastin 09/13/2019 ? Cycle 13 FOLFIRI plus Avastin 10/04/2019 ? Cycle 14 FOLFIRI plus Avastin 10/25/2019 ? Cycle 15 FOLFIRI plus Avastin 11/14/2019 ? Cycle 16 FOLFIRI plus Avastin 12/06/2019 ? CTs neck, chest, abdomen, pelvis 12/21/2019-neck negative; mildly progressive pulmonary metastases; abdomen and pelvis negative for evidence of metastatic disease. ? Cycle 1 FOLFOX/Avastin 12/27/2019 ? Cycle 2 FOLFOX/Avastin 01/17/2020 ? Cycle 3 FOLFOX/Avastin 02/07/2020 ? Cycle 4 FOLFOX 02/28/2020, Avastin held due to need for dental evaluation possible extractions ? Cycle 5 FOLFOX 03/20/2020, Avastin held pending dental evaluation ? CTs 04/04/2020-increased size of pulmonary nodules, no evidence of metastatic disease in the abdomen or pelvis ? Cycle 6 FOLFOX 05/22/2020, Avastin held secondary to dental extractions ? Cycle 7 FOLFOX/Avastin 06/12/2020 ? 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 Imodium as needed. 6.Mucositis secondary to Xeloda. Improved 07/19/2018. 7. History of migraines 8.  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  She is tolerating the FOLFOX/Avastin well.  She will complete another cycle today.  We will monitor for progression of the foot neuropathy symptoms.  She will return for an office visit in the next cycle of chemotherapy in 3 weeks.  The plan is to schedule a restaging CT evaluation after a total of 10 cycles of FOLFOX/Avastin.  Gary Sherrill, MD  06/30/2020  11:32 AM   

## 2020-07-01 ENCOUNTER — Telehealth: Payer: Self-pay | Admitting: Oncology

## 2020-07-01 NOTE — Telephone Encounter (Signed)
Scheduled per 12/20 los, patient has been called and notified of upcoming appointments.

## 2020-07-02 ENCOUNTER — Inpatient Hospital Stay: Payer: BC Managed Care – PPO

## 2020-07-02 ENCOUNTER — Other Ambulatory Visit: Payer: Self-pay

## 2020-07-02 VITALS — BP 131/91 | HR 78 | Resp 18

## 2020-07-02 DIAGNOSIS — C20 Malignant neoplasm of rectum: Secondary | ICD-10-CM | POA: Diagnosis not present

## 2020-07-02 MED ORDER — HEPARIN SOD (PORK) LOCK FLUSH 100 UNIT/ML IV SOLN
500.0000 [IU] | Freq: Once | INTRAVENOUS | Status: AC | PRN
  Administered 2020-07-02: 14:00:00 500 [IU]
  Filled 2020-07-02: qty 5

## 2020-07-02 MED ORDER — SODIUM CHLORIDE 0.9% FLUSH
10.0000 mL | INTRAVENOUS | Status: DC | PRN
  Administered 2020-07-02: 14:00:00 10 mL
  Filled 2020-07-02: qty 10

## 2020-07-14 ENCOUNTER — Encounter: Payer: Self-pay | Admitting: *Deleted

## 2020-07-20 ENCOUNTER — Other Ambulatory Visit: Payer: Self-pay | Admitting: Oncology

## 2020-07-24 ENCOUNTER — Inpatient Hospital Stay: Payer: BC Managed Care – PPO

## 2020-07-24 ENCOUNTER — Other Ambulatory Visit: Payer: Self-pay

## 2020-07-24 ENCOUNTER — Inpatient Hospital Stay: Payer: BC Managed Care – PPO | Attending: Oncology

## 2020-07-24 ENCOUNTER — Inpatient Hospital Stay: Payer: BC Managed Care – PPO | Admitting: Nurse Practitioner

## 2020-07-24 ENCOUNTER — Encounter: Payer: Self-pay | Admitting: Nurse Practitioner

## 2020-07-24 VITALS — BP 145/99 | HR 103 | Resp 18

## 2020-07-24 VITALS — BP 128/64 | HR 118 | Temp 97.8°F | Resp 18 | Ht 67.0 in | Wt 135.3 lb

## 2020-07-24 DIAGNOSIS — Z5111 Encounter for antineoplastic chemotherapy: Secondary | ICD-10-CM | POA: Insufficient documentation

## 2020-07-24 DIAGNOSIS — Z5112 Encounter for antineoplastic immunotherapy: Secondary | ICD-10-CM | POA: Insufficient documentation

## 2020-07-24 DIAGNOSIS — Z79899 Other long term (current) drug therapy: Secondary | ICD-10-CM | POA: Diagnosis not present

## 2020-07-24 DIAGNOSIS — C7800 Secondary malignant neoplasm of unspecified lung: Secondary | ICD-10-CM | POA: Insufficient documentation

## 2020-07-24 DIAGNOSIS — C20 Malignant neoplasm of rectum: Secondary | ICD-10-CM | POA: Diagnosis present

## 2020-07-24 DIAGNOSIS — Z95828 Presence of other vascular implants and grafts: Secondary | ICD-10-CM

## 2020-07-24 LAB — CMP (CANCER CENTER ONLY)
ALT: 41 U/L (ref 0–44)
AST: 48 U/L — ABNORMAL HIGH (ref 15–41)
Albumin: 3.9 g/dL (ref 3.5–5.0)
Alkaline Phosphatase: 261 U/L — ABNORMAL HIGH (ref 38–126)
Anion gap: 8 (ref 5–15)
BUN: 5 mg/dL — ABNORMAL LOW (ref 6–20)
CO2: 25 mmol/L (ref 22–32)
Calcium: 9.2 mg/dL (ref 8.9–10.3)
Chloride: 102 mmol/L (ref 98–111)
Creatinine: 0.59 mg/dL (ref 0.44–1.00)
GFR, Estimated: >60 mL/min (ref >60)
Glucose, Bld: 102 mg/dL — ABNORMAL HIGH (ref 70–99)
Potassium: 4 mmol/L (ref 3.5–5.1)
Sodium: 135 mmol/L (ref 135–145)
Total Bilirubin: 0.8 mg/dL (ref 0.3–1.2)
Total Protein: 6.6 g/dL (ref 6.5–8.1)

## 2020-07-24 LAB — CBC WITH DIFFERENTIAL (CANCER CENTER ONLY)
Abs Immature Granulocytes: 0.02 10*3/uL (ref 0.00–0.07)
Basophils Absolute: 0 10*3/uL (ref 0.0–0.1)
Basophils Relative: 0 %
Eosinophils Absolute: 0 10*3/uL (ref 0.0–0.5)
Eosinophils Relative: 1 %
HCT: 36.4 % (ref 36.0–46.0)
Hemoglobin: 11.4 g/dL — ABNORMAL LOW (ref 12.0–15.0)
Immature Granulocytes: 1 %
Lymphocytes Relative: 19 %
Lymphs Abs: 0.8 10*3/uL (ref 0.7–4.0)
MCH: 20.4 pg — ABNORMAL LOW (ref 26.0–34.0)
MCHC: 31.3 g/dL (ref 30.0–36.0)
MCV: 65.1 fL — ABNORMAL LOW (ref 80.0–100.0)
Monocytes Absolute: 0.6 10*3/uL (ref 0.1–1.0)
Monocytes Relative: 15 %
Neutro Abs: 2.6 10*3/uL (ref 1.7–7.7)
Neutrophils Relative %: 64 %
Platelet Count: 187 10*3/uL (ref 150–400)
RBC: 5.59 MIL/uL — ABNORMAL HIGH (ref 3.87–5.11)
RDW: 18.4 % — ABNORMAL HIGH (ref 11.5–15.5)
WBC Count: 4 10*3/uL (ref 4.0–10.5)
nRBC: 0 % (ref 0.0–0.2)

## 2020-07-24 LAB — CEA (IN HOUSE-CHCC): CEA (CHCC-In House): 5.17 ng/mL — ABNORMAL HIGH (ref 0.00–5.00)

## 2020-07-24 LAB — TOTAL PROTEIN, URINE DIPSTICK: Protein, ur: NEGATIVE mg/dL — AB

## 2020-07-24 MED ORDER — LEUCOVORIN CALCIUM INJECTION 350 MG
200.0000 mg/m2 | Freq: Once | INTRAVENOUS | Status: AC
  Administered 2020-07-24: 318 mg via INTRAVENOUS
  Filled 2020-07-24: qty 15.9

## 2020-07-24 MED ORDER — SODIUM CHLORIDE 0.9 % IV SOLN
Freq: Once | INTRAVENOUS | Status: AC
  Filled 2020-07-24: qty 250

## 2020-07-24 MED ORDER — SODIUM CHLORIDE 0.9 % IV SOLN
7.5000 mg/kg | Freq: Once | INTRAVENOUS | Status: AC
  Administered 2020-07-24: 500 mg via INTRAVENOUS
  Filled 2020-07-24: qty 16

## 2020-07-24 MED ORDER — SODIUM CHLORIDE 0.9% FLUSH
10.0000 mL | INTRAVENOUS | Status: DC | PRN
  Filled 2020-07-24: qty 10

## 2020-07-24 MED ORDER — PALONOSETRON HCL INJECTION 0.25 MG/5ML
INTRAVENOUS | Status: AC
  Filled 2020-07-24: qty 5

## 2020-07-24 MED ORDER — SODIUM CHLORIDE 0.9 % IV SOLN
10.0000 mg | Freq: Once | INTRAVENOUS | Status: AC
  Administered 2020-07-24: 10 mg via INTRAVENOUS
  Filled 2020-07-24: qty 10

## 2020-07-24 MED ORDER — SODIUM CHLORIDE 0.9% FLUSH
10.0000 mL | Freq: Once | INTRAVENOUS | Status: AC
  Administered 2020-07-24: 10 mL
  Filled 2020-07-24: qty 10

## 2020-07-24 MED ORDER — HEPARIN SOD (PORK) LOCK FLUSH 100 UNIT/ML IV SOLN
500.0000 [IU] | Freq: Once | INTRAVENOUS | Status: DC | PRN
  Filled 2020-07-24: qty 5

## 2020-07-24 MED ORDER — SODIUM CHLORIDE 0.9 % IV SOLN
2000.0000 mg/m2 | INTRAVENOUS | Status: AC
  Administered 2020-07-24: 3200 mg via INTRAVENOUS
  Filled 2020-07-24: qty 64

## 2020-07-24 MED ORDER — SODIUM CHLORIDE 0.9 % IV SOLN
150.0000 mg | Freq: Once | INTRAVENOUS | Status: AC
  Administered 2020-07-24: 150 mg via INTRAVENOUS
  Filled 2020-07-24: qty 150

## 2020-07-24 MED ORDER — OXALIPLATIN CHEMO INJECTION 100 MG/20ML
85.0000 mg/m2 | Freq: Once | INTRAVENOUS | Status: AC
  Administered 2020-07-24: 135 mg via INTRAVENOUS
  Filled 2020-07-24: qty 27

## 2020-07-24 MED ORDER — DEXTROSE 5 % IV SOLN
Freq: Once | INTRAVENOUS | Status: AC
Start: 2020-07-24 — End: 2020-07-24
  Filled 2020-07-24: qty 250

## 2020-07-24 MED ORDER — PALONOSETRON HCL INJECTION 0.25 MG/5ML
0.2500 mg | Freq: Once | INTRAVENOUS | Status: AC
  Administered 2020-07-24: 0.25 mg via INTRAVENOUS

## 2020-07-24 NOTE — Progress Notes (Signed)
OK to treat today w/ Elevated HR. Per Ned Card NP

## 2020-07-24 NOTE — Progress Notes (Signed)
Per physician if any remainder of 5FU left, should be wasted 07/26/20 on pump d/c.

## 2020-07-24 NOTE — Progress Notes (Signed)
[place] OFFICE PROGRESS NOTE   Diagnosis: Rectal cancer  INTERVAL HISTORY:   Ms. Klatt returns as scheduled.  She completed another cycle of FOLFOX/Avastin 06/30/2020.  She has mild intermittent nausea.  No vomiting.  No mouth sores.  Some constipation.  She takes laxatives as needed.  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  She denies bleeding.  She reports appetite is poor.  She is having a hard time adjusting to the dentures.  She denies fever, cough, shortness of breath.  No chest pain.  Objective:  Vital signs in last 24 hours:  Blood pressure 128/64, pulse (!) 118, temperature 97.8 F (36.6 C), temperature source Tympanic, resp. rate 18, height _0  (1.702 m), weight 135 lb 4.8 oz (61.4 kg), SpO2 99 %.    HEENT: No thrush or ulcers. Resp: Lungs clear bilaterally. Cardio: Regular, tachycardic. GI: No hepatomegaly.  Left lower quadrant colostomy. Vascular: No leg edema. Port-A-Cath without erythema.  Lab Results:  Lab Results  Component Value Date   WBC 4.0 07/24/2020   HGB 11.4 (L) 07/24/2020   HCT 36.4 07/24/2020   MCV 65.1 (L) 07/24/2020   PLT 187 07/24/2020   NEUTROABS 2.6 07/24/2020    Imaging:  No results found.  Medications: I have reviewed the patient's current medications.  Assessment/Plan: 1.  Rectal cancer-rectal mass noted on digital exam 02/10/2018, colonoscopy confirmed a him my circumferential mass in the rectum ? 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 CTs 02/10/2018-anterior rectal wall thickening, pulmonary nodules measuring up to 9 mm concerning for metastases, few round perirectal lymph nodes ? Pelvic MRI 02/20/2018-hypermetabolic low rectal mass extending to the posterior vagina  with 2 small enlarged perirectal lymph nodes, T4b,N1-2.3 cm from the anal verge ? PET scan 02/23/2018-hypermetabolic rectal mass, 8 mm hypermetabolic lingular nodule, scattered small bilateral lung nodules, some calcified, a few with mildly increased activity ? Cycle 1 FOLFOXIRI8/21/2019 ? Cycle 5 FOLFOXIRI10/16/2019 ? CTs 05/01/2018-significant interval response to therapy with decreased size of the primary rectal mass lesion. Decreasing perirectal lymphadenopathy. Decreased and/or resolved pulmonary nodules. No new sites of disease identified. ? Cycle 6 FOLFOXIRI10/31/2019 ? Radiation/Xeloda 06/12/2018-completed 07/21/2018 ? Xeloda dose reduced 07/10/2018 due to mucositis and diarrhea ? CTs 08/07/2018-compared to 02/10/2018-resolved and decreased pulmonary nodules, few tiny residual noncalcified nodules, decreased 20 perirectal lymph nodes, soft tissue of the rectum indistinguishable from posterior wall of vagina-Vaginal involvement by tumor? ? 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 CT chest 09/29/2018-enlargement of lung nodules, not a candidate for SBRT based on discussion in GI tumor conference and with radiation oncology ? CT chest 11/27/18 - interval growth of numerous pulmonary metastases bilaterally compared to 09/29/18, no new pulmonary mets ? Cycle 1FOLFOXIRI 11/29/18 ? Cycle 2 FOLFOXIRI6/10/2018 ? Cycle 3 FOLFOXIRI 12/28/2018 ? Cycle 4 FOLFIRINOX 01/11/2019 ? CTs 01/24/2019-significant improvement of bilateral pulmonary metastases. ? Cycle 1 FOLFIRI 02/01/2019 ? Cycle 2 FOLFIRI 02/22/2019 ? Cycle 3 FOLFIRI 03/15/2019 ? Cycle 4 FOLFIRI 04/05/2019 ? Cycle 5 FOLFIRI 04/26/2019 ? CTs 05/14/2019-slight enlargement of right lung nodules, no other evidence of  disease progression ? Cycle 6 FOLFIRI 05/17/2019 ? Cycle 7 FOLFIRI plus Avastin 06/13/2019 ? Cycle 8 FOLFIRI plus  Avastin 07/09/2019 ? Cycle 9 FOLFIRI plus Avastin 07/25/2019 ? Cycle 10 FOLFIRI plus Avastin 08/09/2019 ? Cycle 11 FOLFIRI plus Avastin 08/22/2019 ? CTs 09/07/2019-most pulmonary nodules are stable. 1 nodule right lower lobe slightly larger. No new lung lesions. ? Cycle 12 FOLFIRI plus Avastin 09/13/2019 ? Cycle 13 FOLFIRI plus Avastin 10/04/2019 ? Cycle 14 FOLFIRI plus Avastin 10/25/2019 ? Cycle 15 FOLFIRI plus Avastin 11/14/2019 ? Cycle 16 FOLFIRI plus Avastin 12/06/2019 ? CTs neck, chest, abdomen, pelvis 12/21/2019-neck negative; mildly progressive pulmonary metastases; abdomen and pelvis negative for evidence of metastatic disease. ? Cycle 1 FOLFOX/Avastin 12/27/2019 ? Cycle 2 FOLFOX/Avastin 01/17/2020 ? Cycle 3 FOLFOX/Avastin 02/07/2020 ? Cycle 4 FOLFOX 02/28/2020, Avastin held due to need for dental evaluation possible extractions ? Cycle 5 FOLFOX 03/20/2020, Avastin held pending dental evaluation ? CTs 04/04/2020-increased size of pulmonary nodules, no evidence of metastatic disease in the abdomen or pelvis ? Cycle 6 FOLFOX 05/22/2020, Avastin held secondary to dental extractions ? Cycle 7 FOLFOX/Avastin 06/12/2020 ? Cycle 8 FOLFOX/Avastin 06/30/2020 ? 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 Imodium as needed. 6.Mucositis secondary to Xeloda. Improved 07/19/2018. 7. History of migraines 8.  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 Mcclatchey appears stable.  She has completed 8 cycles of FOLFOX/Avastin.  Plan to proceed with cycle 9 today as scheduled.  Restaging CTs after cycle 10.  We reviewed the CBC from today.  Counts adequate to proceed with  treatment.  She began having pain at the Port-A-Cath site during today's office visit.  Port was deaccessed and reaccessed with resolution of the pain/discomfort.  She is tachycardic today.  She has had this intermittently in the past.  We will request repeat vital signs in the infusion area.  She will return for lab, follow-up, FOLFOX/Avastin in 3 weeks.  She will contact the office in the interim with any problems.  Plan reviewed with Dr. Benay Spice.  Ned Card ANP/GNP-BC   07/24/2020  11:55 AM

## 2020-07-24 NOTE — Addendum Note (Signed)
Addended by: Betsy Coder B on: 07/24/2020 12:56 PM   Modules accepted: Orders

## 2020-07-24 NOTE — Patient Instructions (Signed)
[facility] Center Discharge Instructions for Patients Receiving Chemotherapy  Today you received the following chemotherapy agents: bevacizumab, oxaliplatin, leucovorin, 5FU  To help prevent nausea and vomiting after your treatment, we encourage you to take your nausea medication as directed.   If you develop nausea and vomiting that is not controlled by your nausea medication, call the clinic.   BELOW ARE SYMPTOMS THAT SHOULD BE REPORTED IMMEDIATELY:  *FEVER GREATER THAN 100.5 F  *CHILLS WITH OR WITHOUT FEVER  NAUSEA AND VOMITING THAT IS NOT CONTROLLED WITH YOUR NAUSEA MEDICATION  *UNUSUAL SHORTNESS OF BREATH  *UNUSUAL BRUISING OR BLEEDING  TENDERNESS IN MOUTH AND THROAT WITH OR WITHOUT PRESENCE OF ULCERS  *URINARY PROBLEMS  *BOWEL PROBLEMS  UNUSUAL RASH Items with * indicate a potential emergency and should be followed up as soon as possible.  Feel free to call the clinic should you have any questions or concerns. The clinic phone number is [phone].  Please show the CHEMO ALERT CARD at check-in to the Emergency Department and triage nurse.   

## 2020-07-25 ENCOUNTER — Telehealth: Payer: Self-pay | Admitting: Nurse Practitioner

## 2020-07-25 NOTE — Telephone Encounter (Signed)
Scheduled appointments per 1/13 los. Spoke to patient who is aware of appointments dates and times.  

## 2020-07-26 ENCOUNTER — Other Ambulatory Visit: Payer: Self-pay

## 2020-07-26 ENCOUNTER — Inpatient Hospital Stay: Payer: BC Managed Care – PPO

## 2020-07-26 VITALS — BP 145/91 | HR 86 | Temp 96.1°F | Resp 17

## 2020-07-26 DIAGNOSIS — C20 Malignant neoplasm of rectum: Secondary | ICD-10-CM | POA: Diagnosis not present

## 2020-07-26 MED ORDER — SODIUM CHLORIDE 0.9% FLUSH
10.0000 mL | INTRAVENOUS | Status: DC | PRN
  Administered 2020-07-26: 10 mL
  Filled 2020-07-26: qty 10

## 2020-07-26 MED ORDER — HEPARIN SOD (PORK) LOCK FLUSH 100 UNIT/ML IV SOLN
500.0000 [IU] | Freq: Once | INTRAVENOUS | Status: AC | PRN
  Administered 2020-07-26: 500 [IU]
  Filled 2020-07-26: qty 5

## 2020-08-07 ENCOUNTER — Other Ambulatory Visit: Payer: Self-pay | Admitting: Oncology

## 2020-08-14 ENCOUNTER — Other Ambulatory Visit: Payer: Self-pay

## 2020-08-14 ENCOUNTER — Inpatient Hospital Stay: Payer: BC Managed Care – PPO

## 2020-08-14 ENCOUNTER — Inpatient Hospital Stay: Payer: BC Managed Care – PPO | Admitting: Oncology

## 2020-08-14 ENCOUNTER — Inpatient Hospital Stay: Payer: BC Managed Care – PPO | Attending: Oncology

## 2020-08-14 VITALS — BP 145/90 | HR 119 | Temp 97.8°F | Resp 18 | Ht 67.0 in | Wt 132.5 lb

## 2020-08-14 VITALS — HR 102 | Resp 18

## 2020-08-14 DIAGNOSIS — C20 Malignant neoplasm of rectum: Secondary | ICD-10-CM

## 2020-08-14 DIAGNOSIS — Z79899 Other long term (current) drug therapy: Secondary | ICD-10-CM | POA: Diagnosis not present

## 2020-08-14 DIAGNOSIS — Z95828 Presence of other vascular implants and grafts: Secondary | ICD-10-CM

## 2020-08-14 DIAGNOSIS — Z5112 Encounter for antineoplastic immunotherapy: Secondary | ICD-10-CM | POA: Insufficient documentation

## 2020-08-14 DIAGNOSIS — Z5111 Encounter for antineoplastic chemotherapy: Secondary | ICD-10-CM | POA: Insufficient documentation

## 2020-08-14 LAB — CBC WITH DIFFERENTIAL (CANCER CENTER ONLY)
Abs Immature Granulocytes: 0.02 10*3/uL (ref 0.00–0.07)
Basophils Absolute: 0 10*3/uL (ref 0.0–0.1)
Basophils Relative: 1 %
Eosinophils Absolute: 0 10*3/uL (ref 0.0–0.5)
Eosinophils Relative: 1 %
HCT: 34.5 % — ABNORMAL LOW (ref 36.0–46.0)
Hemoglobin: 11 g/dL — ABNORMAL LOW (ref 12.0–15.0)
Immature Granulocytes: 1 %
Lymphocytes Relative: 20 %
Lymphs Abs: 0.6 10*3/uL — ABNORMAL LOW (ref 0.7–4.0)
MCH: 20.9 pg — ABNORMAL LOW (ref 26.0–34.0)
MCHC: 31.9 g/dL (ref 30.0–36.0)
MCV: 65.5 fL — ABNORMAL LOW (ref 80.0–100.0)
Monocytes Absolute: 0.6 10*3/uL (ref 0.1–1.0)
Monocytes Relative: 20 %
Neutro Abs: 1.8 10*3/uL (ref 1.7–7.7)
Neutrophils Relative %: 57 %
Platelet Count: 163 10*3/uL (ref 150–400)
RBC: 5.27 MIL/uL — ABNORMAL HIGH (ref 3.87–5.11)
RDW: 19.1 % — ABNORMAL HIGH (ref 11.5–15.5)
WBC Count: 3.2 10*3/uL — ABNORMAL LOW (ref 4.0–10.5)
nRBC: 0 % (ref 0.0–0.2)

## 2020-08-14 LAB — CMP (CANCER CENTER ONLY)
ALT: 26 U/L (ref 0–44)
AST: 36 U/L (ref 15–41)
Albumin: 3.7 g/dL (ref 3.5–5.0)
Alkaline Phosphatase: 202 U/L — ABNORMAL HIGH (ref 38–126)
Anion gap: 7 (ref 5–15)
BUN: <4 mg/dL — ABNORMAL LOW (ref 6–20)
CO2: 27 mmol/L (ref 22–32)
Calcium: 9 mg/dL (ref 8.9–10.3)
Chloride: 102 mmol/L (ref 98–111)
Creatinine: 0.57 mg/dL (ref 0.44–1.00)
GFR, Estimated: >60 mL/min (ref >60)
Glucose, Bld: 109 mg/dL — ABNORMAL HIGH (ref 70–99)
Potassium: 4 mmol/L (ref 3.5–5.1)
Sodium: 136 mmol/L (ref 135–145)
Total Bilirubin: 0.7 mg/dL (ref 0.3–1.2)
Total Protein: 6.2 g/dL — ABNORMAL LOW (ref 6.5–8.1)

## 2020-08-14 MED ORDER — SODIUM CHLORIDE 0.9 % IV SOLN
2000.0000 mg/m2 | INTRAVENOUS | Status: DC
  Administered 2020-08-14: 3200 mg via INTRAVENOUS
  Filled 2020-08-14: qty 64

## 2020-08-14 MED ORDER — OXALIPLATIN CHEMO INJECTION 100 MG/20ML
85.0000 mg/m2 | Freq: Once | INTRAVENOUS | Status: AC
  Administered 2020-08-14: 135 mg via INTRAVENOUS
  Filled 2020-08-14: qty 20

## 2020-08-14 MED ORDER — ALPRAZOLAM 0.5 MG PO TABS: 0.5000 mg | ORAL_TABLET | Freq: Two times a day (BID) | ORAL | 1 refills | Status: DC | PRN

## 2020-08-14 MED ORDER — PALONOSETRON HCL INJECTION 0.25 MG/5ML
0.2500 mg | Freq: Once | INTRAVENOUS | Status: AC
Start: 2020-08-14 — End: 2020-08-14
  Administered 2020-08-14: 0.25 mg via INTRAVENOUS

## 2020-08-14 MED ORDER — ALPRAZOLAM 0.5 MG PO TABS: 0.5000 mg | ORAL_TABLET | Freq: Two times a day (BID) | ORAL | 5 refills | Status: AC | PRN

## 2020-08-14 MED ORDER — SODIUM CHLORIDE 0.9 % IV SOLN
150.0000 mg | Freq: Once | INTRAVENOUS | Status: AC
  Administered 2020-08-14: 150 mg via INTRAVENOUS
  Filled 2020-08-14: qty 150

## 2020-08-14 MED ORDER — DEXAMETHASONE 4 MG PO TABS: ORAL_TABLET | ORAL | 0 refills | Status: DC

## 2020-08-14 MED ORDER — LEUCOVORIN CALCIUM INJECTION 350 MG
200.0000 mg/m2 | Freq: Once | INTRAVENOUS | Status: AC
  Administered 2020-08-14: 318 mg via INTRAVENOUS
  Filled 2020-08-14: qty 15.9

## 2020-08-14 MED ORDER — DEXTROSE 5 % IV SOLN
Freq: Once | INTRAVENOUS | Status: AC
Start: 2020-08-14 — End: 2020-08-14
  Filled 2020-08-14: qty 250

## 2020-08-14 MED ORDER — SODIUM CHLORIDE 0.9% FLUSH
10.0000 mL | Freq: Once | INTRAVENOUS | Status: AC
Start: 2020-08-14 — End: 2020-08-14
  Administered 2020-08-14: 10 mL
  Filled 2020-08-14: qty 10

## 2020-08-14 MED ORDER — SODIUM CHLORIDE 0.9 % IV SOLN
Freq: Once | INTRAVENOUS | Status: AC
  Filled 2020-08-14: qty 250

## 2020-08-14 MED ORDER — SODIUM CHLORIDE 0.9 % IV SOLN
10.0000 mg | Freq: Once | INTRAVENOUS | Status: AC
  Administered 2020-08-14: 10 mg via INTRAVENOUS
  Filled 2020-08-14: qty 10

## 2020-08-14 MED ORDER — SODIUM CHLORIDE 0.9 % IV SOLN
450.0000 mg | Freq: Once | INTRAVENOUS | Status: AC
  Administered 2020-08-14: 450 mg via INTRAVENOUS
  Filled 2020-08-14: qty 16

## 2020-08-14 MED ORDER — PALONOSETRON HCL INJECTION 0.25 MG/5ML
INTRAVENOUS | Status: AC
  Filled 2020-08-14: qty 5

## 2020-08-14 NOTE — Progress Notes (Signed)
[place] OFFICE PROGRESS NOTE   Diagnosis: Rectal cancer  INTERVAL HISTORY:   Tina Morales completed another cycle of FOLFOX/Avastin on 07/24/2020.  She had an episode of abdominal pain and nausea/vomiting 2 weeks after the chemotherapy.  This lasted for several days.  She was unable to eat.  The nausea has resolved. No cold sensitivity.  No change in peripheral numbness.  This does not interfere with activity.  Objective:  Vital signs in last 24 hours:  Blood pressure (!) 145/90, pulse (!) 119, temperature 97.8 F (36.6 C), temperature source Tympanic, resp. rate 18, height 5' 7"  (1.702 m), weight 132 lb 8 oz (60.1 kg), SpO2 98 %.    HEENT: No thrush or ulcers Resp: Lungs clear bilaterally Cardio: Regular rate and rhythm, tachycardia GI: Soft, no hepatomegaly, left lower quadrant colostomy Vascular: No leg edema Neuro: Mild loss of vibratory sense at the fingertips bilaterally   Portacath/PICC-without erythema  Lab Results:  Lab Results  Component Value Date   WBC 3.2 (L) 08/14/2020   HGB 11.0 (L) 08/14/2020   HCT 34.5 (L) 08/14/2020   MCV 65.5 (L) 08/14/2020   PLT 163 08/14/2020   NEUTROABS 1.8 08/14/2020    CMP  Lab Results  Component Value Date   NA 136 08/14/2020   K 4.0 08/14/2020   CL 102 08/14/2020   CO2 27 08/14/2020   GLUCOSE 109 (H) 08/14/2020   BUN <4 (L) 08/14/2020   CREATININE 0.57 08/14/2020   CALCIUM 9.0 08/14/2020   PROT 6.2 (L) 08/14/2020   ALBUMIN 3.7 08/14/2020   AST 36 08/14/2020   ALT 26 08/14/2020   ALKPHOS 202 (H) 08/14/2020   BILITOT 0.7 08/14/2020   GFRNONAA >60 08/14/2020   GFRAA >60 04/10/2020    Lab Results  Component Value Date   CEA1 5.17 (H) 07/24/2020    Medications: I have reviewed the patient's current medications.   Assessment/Plan: 1.  Rectal cancer-rectal mass noted on digital exam 02/10/2018, colonoscopy confirmed a him my circumferential mass in the rectum ? 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 CTs 02/10/2018-anterior rectal wall thickening, pulmonary nodules measuring up to 9 mm concerning for metastases, few round perirectal lymph nodes ? Pelvic MRI 02/20/2018-hypermetabolic low rectal mass extending to the posterior vagina with 2 small enlarged perirectal lymph nodes, T4b,N1-2.3 cm from the anal verge ? PET scan 02/23/2018-hypermetabolic rectal mass, 8 mm hypermetabolic lingular nodule, scattered small bilateral lung nodules, some calcified, a few with mildly increased activity ? Cycle 1 FOLFOXIRI8/21/2019 ? Cycle 5 FOLFOXIRI10/16/2019 ? CTs 05/01/2018-significant interval response to therapy with decreased size of the primary rectal mass lesion. Decreasing perirectal lymphadenopathy. Decreased and/or resolved pulmonary nodules. No new sites of disease identified. ? Cycle 6 FOLFOXIRI10/31/2019 ? Radiation/Xeloda 06/12/2018-completed 07/21/2018 ? Xeloda dose reduced 07/10/2018 due to mucositis and diarrhea ? CTs 08/07/2018-compared to 02/10/2018-resolved and decreased pulmonary nodules, few tiny residual noncalcified nodules, decreased 20 perirectal lymph nodes, soft tissue of the rectum indistinguishable from posterior wall of vagina-Vaginal involvement by tumor? ? 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 CT chest 09/29/2018-enlargement of lung nodules, not a candidate for SBRT based on discussion in GI tumor conference and with radiation oncology ?  CT chest 11/27/18 - interval growth of numerous pulmonary metastases  bilaterally compared to 09/29/18, no new pulmonary mets ? Cycle 1FOLFOXIRI 11/29/18 ? Cycle 2 FOLFOXIRI6/10/2018 ? Cycle 3 FOLFOXIRI 12/28/2018 ? Cycle 4 FOLFIRINOX 01/11/2019 ? CTs 01/24/2019-significant improvement of bilateral pulmonary metastases. ? Cycle 1 FOLFIRI 02/01/2019 ? Cycle 2 FOLFIRI 02/22/2019 ? Cycle 3 FOLFIRI 03/15/2019 ? Cycle 4 FOLFIRI 04/05/2019 ? Cycle 5 FOLFIRI 04/26/2019 ? CTs 05/14/2019-slight enlargement of right lung nodules, no other evidence of disease progression ? Cycle 6 FOLFIRI 05/17/2019 ? Cycle 7 FOLFIRI plus Avastin 06/13/2019 ? Cycle 8 FOLFIRI plus Avastin 07/09/2019 ? Cycle 9 FOLFIRI plus Avastin 07/25/2019 ? Cycle 10 FOLFIRI plus Avastin 08/09/2019 ? Cycle 11 FOLFIRI plus Avastin 08/22/2019 ? CTs 09/07/2019-most pulmonary nodules are stable. 1 nodule right lower lobe slightly larger. No new lung lesions. ? Cycle 12 FOLFIRI plus Avastin 09/13/2019 ? Cycle 13 FOLFIRI plus Avastin 10/04/2019 ? Cycle 14 FOLFIRI plus Avastin 10/25/2019 ? Cycle 15 FOLFIRI plus Avastin 11/14/2019 ? Cycle 16 FOLFIRI plus Avastin 12/06/2019 ? CTs neck, chest, abdomen, pelvis 12/21/2019-neck negative; mildly progressive pulmonary metastases; abdomen and pelvis negative for evidence of metastatic disease. ? Cycle 1 FOLFOX/Avastin 12/27/2019 ? Cycle 2 FOLFOX/Avastin 01/17/2020 ? Cycle 3 FOLFOX/Avastin 02/07/2020 ? Cycle 4 FOLFOX 02/28/2020, Avastin held due to need for dental evaluation possible extractions ? Cycle 5 FOLFOX 03/20/2020, Avastin held pending dental evaluation ? CTs 04/04/2020-increased size of pulmonary nodules, no evidence of metastatic disease in the abdomen or pelvis ? Cycle 6 FOLFOX 05/22/2020, Avastin held secondary to dental extractions ? Cycle 7 FOLFOX/Avastin 06/12/2020 ? Cycle 8 FOLFOX/Avastin 06/30/2020 ? Cycle 9 FOLFOX/Avastin 07/24/2020 ? Cycle 10 FOLFOX/Avastin 08/14/2020  2.History of diarrhea and rectal pain secondary to #1 3.History of tobacco use 4.Anemia  secondary to thalassemia, rectal bleeding, and potentially iron deficiency 5.Diarrhea secondary to Xeloda and radiation. Imodium as needed. 6.Mucositis secondary to Xeloda. Improved 07/19/2018. 7. History of migraines 8.  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  She will complete another cycle of FOLFOX/Avastin today.  The etiology of the acute nausea/vomiting and abdominal pain last week is unclear, potentially related to adhesions.  She will call for recurrent nausea and vomiting.  She will be scheduled for a restaging CT evaluation after this cycle of chemotherapy.  Betsy Coder, MD  08/14/2020  10:48 AM

## 2020-08-14 NOTE — Progress Notes (Signed)
Per MD ok to reduce avastin dose due to weight loss to 450mg .

## 2020-08-14 NOTE — Patient Instructions (Signed)

## 2020-08-14 NOTE — Patient Instructions (Signed)
[facility] Discharge Instructions for Patients Receiving Chemotherapy  Today you received the following chemotherapy agents: Bevacizumab, Oxaliplatin, Leucovorin, and Fluorouracil   To help prevent nausea and vomiting after your treatment, we encourage you to take your nausea medication  as prescribed.    If you develop nausea and vomiting that is not controlled by your nausea medication, call the clinic.   BELOW ARE SYMPTOMS THAT SHOULD BE REPORTED IMMEDIATELY:  *FEVER GREATER THAN 100.5 F  *CHILLS WITH OR WITHOUT FEVER  NAUSEA AND VOMITING THAT IS NOT CONTROLLED WITH YOUR NAUSEA MEDICATION  *UNUSUAL SHORTNESS OF BREATH  *UNUSUAL BRUISING OR BLEEDING  TENDERNESS IN MOUTH AND THROAT WITH OR WITHOUT PRESENCE OF ULCERS  *URINARY PROBLEMS  *BOWEL PROBLEMS  UNUSUAL RASH Items with * indicate a potential emergency and should be followed up as soon as possible.  Feel free to call the clinic should you have any questions or concerns. The clinic phone number is [phone].  Please show the CHEMO ALERT CARD at check-in to the Emergency Department and triage nurse.   

## 2020-08-14 NOTE — Progress Notes (Signed)
Per Dr. Benay Spice - okay to treat with elevated heart rate.

## 2020-08-15 ENCOUNTER — Telehealth: Payer: Self-pay | Admitting: Oncology

## 2020-08-15 NOTE — Telephone Encounter (Signed)
Scheduled appointments per 2/3 los. Spoke to patient who is aware of appointments dates and times.  

## 2020-08-16 ENCOUNTER — Other Ambulatory Visit: Payer: Self-pay

## 2020-08-16 ENCOUNTER — Inpatient Hospital Stay: Payer: BC Managed Care – PPO

## 2020-08-16 VITALS — BP 139/93 | HR 97 | Temp 97.1°F

## 2020-08-16 DIAGNOSIS — C20 Malignant neoplasm of rectum: Secondary | ICD-10-CM | POA: Diagnosis not present

## 2020-08-16 MED ORDER — HEPARIN SOD (PORK) LOCK FLUSH 100 UNIT/ML IV SOLN
500.0000 [IU] | Freq: Once | INTRAVENOUS | Status: AC | PRN
  Administered 2020-08-16: 500 [IU]
  Filled 2020-08-16: qty 5

## 2020-08-16 MED ORDER — SODIUM CHLORIDE 0.9% FLUSH
10.0000 mL | INTRAVENOUS | Status: DC | PRN
  Administered 2020-08-16: 10 mL
  Filled 2020-08-16: qty 10

## 2020-08-31 ENCOUNTER — Other Ambulatory Visit: Payer: Self-pay | Admitting: Oncology

## 2020-09-01 ENCOUNTER — Encounter (HOSPITAL_COMMUNITY): Payer: Self-pay

## 2020-09-01 ENCOUNTER — Other Ambulatory Visit: Payer: Self-pay

## 2020-09-01 ENCOUNTER — Ambulatory Visit (HOSPITAL_COMMUNITY)
Admission: RE | Admit: 2020-09-01 | Discharge: 2020-09-01 | Disposition: A | Discharge status: Home / Self Care | Payer: BC Managed Care – PPO | Source: Ambulatory Visit | Attending: Oncology | Admitting: Oncology

## 2020-09-01 DIAGNOSIS — C20 Malignant neoplasm of rectum: Secondary | ICD-10-CM | POA: Insufficient documentation

## 2020-09-01 MED ORDER — IOHEXOL 300 MG/ML  SOLN
100.0000 mL | Freq: Once | INTRAMUSCULAR | Status: AC | PRN
  Administered 2020-09-01: 100 mL via INTRAVENOUS

## 2020-09-01 MED ORDER — HEPARIN SOD (PORK) LOCK FLUSH 100 UNIT/ML IV SOLN
INTRAVENOUS | Status: AC
  Filled 2020-09-01: qty 5

## 2020-09-01 MED ORDER — HEPARIN SOD (PORK) LOCK FLUSH 100 UNIT/ML IV SOLN
500.0000 [IU] | Freq: Once | INTRAVENOUS | Status: AC
  Administered 2020-09-01: 500 [IU] via INTRAVENOUS

## 2020-09-04 ENCOUNTER — Inpatient Hospital Stay: Payer: BC Managed Care – PPO

## 2020-09-04 ENCOUNTER — Encounter: Payer: Self-pay | Admitting: Nurse Practitioner

## 2020-09-04 ENCOUNTER — Inpatient Hospital Stay (HOSPITAL_BASED_OUTPATIENT_CLINIC_OR_DEPARTMENT_OTHER): Payer: BC Managed Care – PPO | Admitting: Nurse Practitioner

## 2020-09-04 ENCOUNTER — Other Ambulatory Visit: Payer: Self-pay

## 2020-09-04 VITALS — BP 115/92 | HR 86

## 2020-09-04 VITALS — BP 123/87 | HR 107 | Temp 97.8°F | Resp 20 | Ht 67.0 in | Wt 135.1 lb

## 2020-09-04 DIAGNOSIS — C20 Malignant neoplasm of rectum: Secondary | ICD-10-CM

## 2020-09-04 DIAGNOSIS — Z95828 Presence of other vascular implants and grafts: Secondary | ICD-10-CM

## 2020-09-04 LAB — CMP (CANCER CENTER ONLY)
ALT: 54 U/L — ABNORMAL HIGH (ref 0–44)
AST: 63 U/L — ABNORMAL HIGH (ref 15–41)
Albumin: 3.5 g/dL (ref 3.5–5.0)
Alkaline Phosphatase: 260 U/L — ABNORMAL HIGH (ref 38–126)
Anion gap: 9 (ref 5–15)
BUN: 4 mg/dL — ABNORMAL LOW (ref 6–20)
CO2: 22 mmol/L (ref 22–32)
Calcium: 8.5 mg/dL — ABNORMAL LOW (ref 8.9–10.3)
Chloride: 102 mmol/L (ref 98–111)
Creatinine: 0.56 mg/dL (ref 0.44–1.00)
GFR, Estimated: >60 mL/min (ref >60)
Glucose, Bld: 83 mg/dL (ref 70–99)
Potassium: 4.2 mmol/L (ref 3.5–5.1)
Sodium: 133 mmol/L — ABNORMAL LOW (ref 135–145)
Total Bilirubin: 0.6 mg/dL (ref 0.3–1.2)
Total Protein: 6 g/dL — ABNORMAL LOW (ref 6.5–8.1)

## 2020-09-04 LAB — TOTAL PROTEIN, URINE DIPSTICK: Protein, ur: NEGATIVE mg/dL

## 2020-09-04 LAB — CBC WITH DIFFERENTIAL (CANCER CENTER ONLY)
Abs Immature Granulocytes: 0.01 10*3/uL (ref 0.00–0.07)
Basophils Absolute: 0 10*3/uL (ref 0.0–0.1)
Basophils Relative: 1 %
Eosinophils Absolute: 0.1 10*3/uL (ref 0.0–0.5)
Eosinophils Relative: 2 %
HCT: 36 % (ref 36.0–46.0)
Hemoglobin: 11.2 g/dL — ABNORMAL LOW (ref 12.0–15.0)
Immature Granulocytes: 0 %
Lymphocytes Relative: 22 %
Lymphs Abs: 0.6 10*3/uL — ABNORMAL LOW (ref 0.7–4.0)
MCH: 20.7 pg — ABNORMAL LOW (ref 26.0–34.0)
MCHC: 31.1 g/dL (ref 30.0–36.0)
MCV: 66.5 fL — ABNORMAL LOW (ref 80.0–100.0)
Monocytes Absolute: 0.5 10*3/uL (ref 0.1–1.0)
Monocytes Relative: 18 %
Neutro Abs: 1.6 10*3/uL — ABNORMAL LOW (ref 1.7–7.7)
Neutrophils Relative %: 57 %
Platelet Count: 172 10*3/uL (ref 150–400)
RBC: 5.41 MIL/uL — ABNORMAL HIGH (ref 3.87–5.11)
RDW: 18.6 % — ABNORMAL HIGH (ref 11.5–15.5)
WBC Count: 2.9 10*3/uL — ABNORMAL LOW (ref 4.0–10.5)
nRBC: 0 % (ref 0.0–0.2)

## 2020-09-04 LAB — CEA (IN HOUSE-CHCC): CEA (CHCC-In House): 4.73 ng/mL (ref 0.00–5.00)

## 2020-09-04 MED ORDER — SODIUM CHLORIDE 0.9 % IV SOLN
10.0000 mg | Freq: Once | INTRAVENOUS | Status: AC
  Administered 2020-09-04: 10 mg via INTRAVENOUS
  Filled 2020-09-04: qty 10

## 2020-09-04 MED ORDER — DEXTROSE 5 % IV SOLN
Freq: Once | INTRAVENOUS | Status: AC
  Filled 2020-09-04: qty 250

## 2020-09-04 MED ORDER — SODIUM CHLORIDE 0.9 % IV SOLN
150.0000 mg | Freq: Once | INTRAVENOUS | Status: AC
  Administered 2020-09-04: 150 mg via INTRAVENOUS
  Filled 2020-09-04: qty 150

## 2020-09-04 MED ORDER — SODIUM CHLORIDE 0.9% FLUSH
10.0000 mL | Freq: Once | INTRAVENOUS | Status: AC
  Administered 2020-09-04: 10 mL
  Filled 2020-09-04: qty 10

## 2020-09-04 MED ORDER — SODIUM CHLORIDE 0.9 % IV SOLN
7.5000 mg/kg | Freq: Once | INTRAVENOUS | Status: AC
  Administered 2020-09-04: 500 mg via INTRAVENOUS
  Filled 2020-09-04: qty 16

## 2020-09-04 MED ORDER — OXALIPLATIN CHEMO INJECTION 100 MG/20ML
85.0000 mg/m2 | Freq: Once | INTRAVENOUS | Status: AC
  Administered 2020-09-04: 135 mg via INTRAVENOUS
  Filled 2020-09-04: qty 27

## 2020-09-04 MED ORDER — PALONOSETRON HCL INJECTION 0.25 MG/5ML
0.2500 mg | Freq: Once | INTRAVENOUS | Status: AC
  Administered 2020-09-04: 0.25 mg via INTRAVENOUS

## 2020-09-04 MED ORDER — LEUCOVORIN CALCIUM INJECTION 350 MG
200.0000 mg/m2 | Freq: Once | INTRAVENOUS | Status: AC
  Administered 2020-09-04: 318 mg via INTRAVENOUS
  Filled 2020-09-04: qty 15.9

## 2020-09-04 MED ORDER — SODIUM CHLORIDE 0.9 % IV SOLN
Freq: Once | INTRAVENOUS | Status: AC
  Filled 2020-09-04: qty 250

## 2020-09-04 MED ORDER — PALONOSETRON HCL INJECTION 0.25 MG/5ML
INTRAVENOUS | Status: AC
  Filled 2020-09-04: qty 5

## 2020-09-04 MED ORDER — SODIUM CHLORIDE 0.9 % IV SOLN
2000.0000 mg/m2 | INTRAVENOUS | Status: DC
  Administered 2020-09-04: 3200 mg via INTRAVENOUS
  Filled 2020-09-04: qty 64

## 2020-09-04 NOTE — Patient Instructions (Signed)
Implanted Port Insertion, Care After This sheet gives you information about how to care for yourself after your procedure. Your health care provider may also give you more specific instructions. If you have problems or questions, contact your health care provider. What can I expect after the procedure? After the procedure, it is common to have:  Discomfort at the port insertion site.  Bruising on the skin over the port. This should improve over 3-4 days. Follow these instructions at home: Port care  After your port is placed, you will get a manufacturer's information card. The card has information about your port. Keep this card with you at all times.  Take care of the port as told by your health care provider. Ask your health care provider if you or a family member can get training for taking care of the port at home. A home health care nurse may also take care of the port.  Make sure to remember what type of port you have. Incision care  Follow instructions from your health care provider about how to take care of your port insertion site. Make sure you: ? Wash your hands with soap and water before and after you change your bandage (dressing). If soap and water are not available, use hand sanitizer. ? Change your dressing as told by your health care provider. ? Leave stitches (sutures), skin glue, or adhesive strips in place. These skin closures may need to stay in place for 2 weeks or longer. If adhesive strip edges start to loosen and curl up, you may trim the loose edges. Do not remove adhesive strips completely unless your health care provider tells you to do that.  Check your port insertion site every day for signs of infection. Check for: ? Redness, swelling, or pain. ? Fluid or blood. ? Warmth. ? Pus or a bad smell.      Activity  Return to your normal activities as told by your health care provider. Ask your health care provider what activities are safe for you.  Do not  lift anything that is heavier than 10 lb (4.5 kg), or the limit that you are told, until your health care provider says that it is safe. General instructions  Take over-the-counter and prescription medicines only as told by your health care provider.  Do not take baths, swim, or use a hot tub until your health care provider approves. Ask your health care provider if you may take showers. You may only be allowed to take sponge baths.  Do not drive for 24 hours if you were given a sedative during your procedure.  Wear a medical alert bracelet in case of an emergency. This will tell any health care providers that you have a port.  Keep all follow-up visits as told by your health care provider. This is important. Contact a health care provider if:  You cannot flush your port with saline as directed, or you cannot draw blood from the port.  You have a fever or chills.  You have redness, swelling, or pain around your port insertion site.  You have fluid or blood coming from your port insertion site.  Your port insertion site feels warm to the touch.  You have pus or a bad smell coming from the port insertion site. Get help right away if:  You have chest pain or shortness of breath.  You have bleeding from your port that you cannot control. Summary  Take care of the port as told by your   health care provider. Keep the manufacturer's information card with you at all times.  Change your dressing as told by your health care provider.  Contact a health care provider if you have a fever or chills or if you have redness, swelling, or pain around your port insertion site.  Keep all follow-up visits as told by your health care provider. This information is not intended to replace advice given to you by your health care provider. Make sure you discuss any questions you have with your health care provider. Document Revised: 01/24/2018 Document Reviewed: 01/24/2018 Elsevier Patient Education   2021 [employer]  

## 2020-09-04 NOTE — Patient Instructions (Signed)
[place] Discharge Instructions for Patients Receiving Chemotherapy  Today you received the following chemotherapy agents: Bevacizumab, Oxaliplatin, Leucovorin, and Fluorouracil   To help prevent nausea and vomiting after your treatment, we encourage you to take your nausea medication  as prescribed.    If you develop nausea and vomiting that is not controlled by your nausea medication, call the clinic.   BELOW ARE SYMPTOMS THAT SHOULD BE REPORTED IMMEDIATELY:  *FEVER GREATER THAN 100.5 F  *CHILLS WITH OR WITHOUT FEVER  NAUSEA AND VOMITING THAT IS NOT CONTROLLED WITH YOUR NAUSEA MEDICATION  *UNUSUAL SHORTNESS OF BREATH  *UNUSUAL BRUISING OR BLEEDING  TENDERNESS IN MOUTH AND THROAT WITH OR WITHOUT PRESENCE OF ULCERS  *URINARY PROBLEMS  *BOWEL PROBLEMS  UNUSUAL RASH Items with * indicate a potential emergency and should be followed up as soon as possible.  Feel free to call the clinic should you have any questions or concerns. The clinic phone number is [phone].  Please show the [facility] at check-in to the Emergency Department and triage nurse.

## 2020-09-04 NOTE — Progress Notes (Addendum)
[facility] OFFICE PROGRESS NOTE   Diagnosis: Rectal cancer  INTERVAL HISTORY:   Tina Morales returns as scheduled.  She completed another cycle of FOLFOX/Avastin 08/14/2020.  She has had no further episodes of the acute abdominal pain accompanied by nausea/vomiting reported at the time of her last visit.  No mouth sores.  Appetite varies.  She has mild persistent cold sensitivity.  Stable numbness/tingling in the feet.  Mild intermittent numbness/tingling in the fingertips.  Symptoms do not interfere with activity.  She denies bleeding.  Objective:  Vital signs in last 24 hours:  Blood pressure 123/87, pulse (!) 107, temperature 97.8 F (36.6 C), temperature source Tympanic, resp. rate 20, height _0  (1.702 m), weight 135 lb 1.6 oz (61.3 kg), SpO2 99 %.    HEENT: No thrush or ulcers. Resp: Lungs clear bilaterally. Cardio: Regular rate and rhythm. GI: No hepatomegaly.  Left lower quadrant colostomy. Vascular: No leg edema. Neuro: Vibratory sense intact over the fingertips per tuning fork exam. Skin: Palms without erythema. Port-A-Cath without erythema.   Lab Results:  Lab Results  Component Value Date   WBC 2.9 (L) 09/04/2020   HGB 11.2 (L) 09/04/2020   HCT 36.0 09/04/2020   MCV 66.5 (L) 09/04/2020   PLT 172 09/04/2020   NEUTROABS 1.6 (L) 09/04/2020    Imaging:  No results found.  Medications: I have reviewed the patient's current medications.  Assessment/Plan: 1. Rectal cancer-rectal mass noted on digital exam 02/10/2018, colonoscopy confirmed a him my circumferential mass in the rectum ? 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 CTs 02/10/2018-anterior rectal wall thickening, pulmonary nodules measuring up to 9 mm concerning  for metastases, few round perirectal lymph nodes ? Pelvic MRI 02/20/2018-hypermetabolic low rectal mass extending to the posterior vagina with 2 small enlarged perirectal lymph nodes, T4b,N1-2.3 cm from the anal verge ? PET scan 02/23/2018-hypermetabolic rectal mass, 8 mm hypermetabolic lingular nodule, scattered small bilateral lung nodules, some calcified, a few with mildly increased activity ? Cycle 1 FOLFOXIRI8/21/2019 ? Cycle 5 FOLFOXIRI10/16/2019 ? CTs 05/01/2018-significant interval response to therapy with decreased size of the primary rectal mass lesion. Decreasing perirectal lymphadenopathy. Decreased and/or resolved pulmonary nodules. No new sites of disease identified. ? Cycle 6 FOLFOXIRI10/31/2019 ? Radiation/Xeloda 06/12/2018-completed 07/21/2018 ? Xeloda dose reduced 07/10/2018 due to mucositis and diarrhea ? CTs 08/07/2018-compared to 02/10/2018-resolved and decreased pulmonary nodules, few tiny residual noncalcified nodules, decreased 20 perirectal lymph nodes, soft tissue of the rectum indistinguishable from posterior wall of vagina-Vaginal involvement by tumor? ? 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 CT chest 09/29/2018-enlargement of lung nodules, not a candidate for SBRT based on discussion in GI tumor conference and with radiation oncology ? CT chest 11/27/18 - interval growth of numerous pulmonary metastases bilaterally compared to 09/29/18, no new pulmonary mets ? Cycle 1FOLFOXIRI 11/29/18 ? Cycle 2 FOLFOXIRI6/10/2018 ? Cycle 3 FOLFOXIRI 12/28/2018 ? Cycle 4 FOLFIRINOX 01/11/2019 ? CTs 01/24/2019-significant improvement of bilateral pulmonary metastases. ? Cycle 1 FOLFIRI 02/01/2019 ? Cycle 2 FOLFIRI 02/22/2019 ? Cycle 3 FOLFIRI  03/15/2019 ? Cycle 4 FOLFIRI 04/05/2019 ? Cycle 5 FOLFIRI 04/26/2019 ? CTs 05/14/2019-slight enlargement of right lung nodules, no other evidence of disease progression ?  Cycle 6 FOLFIRI 05/17/2019 ? Cycle 7 FOLFIRI plus Avastin 06/13/2019 ? Cycle 8 FOLFIRI plus Avastin 07/09/2019 ? Cycle 9 FOLFIRI plus Avastin 07/25/2019 ? Cycle 10 FOLFIRI plus Avastin 08/09/2019 ? Cycle 11 FOLFIRI plus Avastin 08/22/2019 ? CTs 09/07/2019-most pulmonary nodules are stable. 1 nodule right lower lobe slightly larger. No new lung lesions. ? Cycle 12 FOLFIRI plus Avastin 09/13/2019 ? Cycle 13 FOLFIRI plus Avastin 10/04/2019 ? Cycle 14 FOLFIRI plus Avastin 10/25/2019 ? Cycle 15 FOLFIRI plus Avastin 11/14/2019 ? Cycle 16 FOLFIRI plus Avastin 12/06/2019 ? CTs neck, chest, abdomen, pelvis 12/21/2019-neck negative; mildly progressive pulmonary metastases; abdomen and pelvis negative for evidence of metastatic disease. ? Cycle 1 FOLFOX/Avastin 12/27/2019 ? Cycle 2 FOLFOX/Avastin 01/17/2020 ? Cycle 3 FOLFOX/Avastin 02/07/2020 ? Cycle 4 FOLFOX 02/28/2020, Avastin held due to need for dental evaluation possible extractions ? Cycle 5 FOLFOX 03/20/2020, Avastin held pending dental evaluation ? CTs 04/04/2020-increased size of pulmonary nodules, no evidence of metastatic disease in the abdomen or pelvis ? Cycle 6 FOLFOX 05/22/2020, Avastin held secondary to dental extractions ? Cycle 7 FOLFOX/Avastin 06/12/2020 ? Cycle 8 FOLFOX/Avastin 06/30/2020 ? Cycle 9 FOLFOX/Avastin 07/24/2020 ? Cycle 10 FOLFOX/Avastin 08/14/2020 ? CTs 09/01/2020-improvement in bilateral pulmonary metastases. ? 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 Imodium as needed. 6.Mucositis secondary to Xeloda. Improved 07/19/2018. 7. History of migraines 8.  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 Goguen appears stable.  She has completed 10 cycles of FOLFOX/Avastin.  Restaging chest CT shows improvement in the lung nodules.  Dr. Benay Spice reviewed the report/images with her at today's visit with the recommendation to continue FOLFOX/Avastin, cycle 11 today.  She agrees with this plan.  We reviewed the CBC from today.  Counts adequate to proceed with treatment.  She has mild neutropenia.  She understands to contact the office with fever, chills, other signs of infection.  She will return for lab, follow-up, FOLFOX/Avastin in 3 weeks.  She will contact the office in the interim as outlined above or with any other problems.  Patient seen with Dr. Benay Spice.    Ned Card ANP/GNP-BC   09/04/2020  9:40 AM This was a shared visit with Ned Card.  Ms. Lindo appears stable.  We reviewed the restaging CT images with her.  I recommend continuing FOLFOX/Avastin.  There is CT evidence of a response to therapy.  I was present for greater than 50% of today's visit.  I performed medical decision making.  Julieanne Manson, MD

## 2020-09-05 ENCOUNTER — Telehealth: Payer: Self-pay | Admitting: Nurse Practitioner

## 2020-09-05 NOTE — Telephone Encounter (Signed)
Scheduled appointments per 2/24 los. Spoke to patient who is aware of appointments dates and times.

## 2020-09-06 ENCOUNTER — Inpatient Hospital Stay: Payer: BC Managed Care – PPO

## 2020-09-06 ENCOUNTER — Other Ambulatory Visit: Payer: Self-pay

## 2020-09-06 VITALS — BP 143/95 | HR 65 | Temp 97.2°F | Resp 20

## 2020-09-06 DIAGNOSIS — C20 Malignant neoplasm of rectum: Secondary | ICD-10-CM

## 2020-09-06 MED ORDER — HEPARIN SOD (PORK) LOCK FLUSH 100 UNIT/ML IV SOLN
500.0000 [IU] | Freq: Once | INTRAVENOUS | Status: AC | PRN
  Administered 2020-09-06: 500 [IU]
  Filled 2020-09-06: qty 5

## 2020-09-06 MED ORDER — SODIUM CHLORIDE 0.9% FLUSH
10.0000 mL | INTRAVENOUS | Status: DC | PRN
  Administered 2020-09-06: 10 mL
  Filled 2020-09-06: qty 10

## 2020-09-21 ENCOUNTER — Other Ambulatory Visit: Payer: Self-pay | Admitting: Oncology

## 2020-09-24 ENCOUNTER — Telehealth: Payer: Self-pay | Admitting: *Deleted

## 2020-09-24 NOTE — Telephone Encounter (Signed)
Left message that left antecubital area has area of redness about size of her thumb and "knot" size of quarter. No pain and her arm is not swollen. Noted it today. Has appointment tomorrow. Suggested she apply warm compress few times day and we will look at it tomorrow.

## 2020-09-25 ENCOUNTER — Other Ambulatory Visit: Payer: BC Managed Care – PPO

## 2020-09-25 ENCOUNTER — Ambulatory Visit: Payer: BC Managed Care – PPO | Admitting: Oncology

## 2020-09-25 ENCOUNTER — Inpatient Hospital Stay: Payer: BC Managed Care – PPO

## 2020-09-25 ENCOUNTER — Inpatient Hospital Stay (HOSPITAL_BASED_OUTPATIENT_CLINIC_OR_DEPARTMENT_OTHER): Payer: BC Managed Care – PPO | Admitting: Oncology

## 2020-09-25 ENCOUNTER — Other Ambulatory Visit: Payer: Self-pay

## 2020-09-25 ENCOUNTER — Ambulatory Visit: Payer: BC Managed Care – PPO

## 2020-09-25 ENCOUNTER — Inpatient Hospital Stay: Payer: BC Managed Care – PPO | Attending: Oncology

## 2020-09-25 VITALS — BP 124/86 | HR 92 | Temp 98.3°F | Resp 18 | Ht 67.0 in | Wt 130.6 lb

## 2020-09-25 DIAGNOSIS — Z5112 Encounter for antineoplastic immunotherapy: Secondary | ICD-10-CM | POA: Insufficient documentation

## 2020-09-25 DIAGNOSIS — C20 Malignant neoplasm of rectum: Secondary | ICD-10-CM | POA: Insufficient documentation

## 2020-09-25 DIAGNOSIS — Z95828 Presence of other vascular implants and grafts: Secondary | ICD-10-CM

## 2020-09-25 DIAGNOSIS — Z5111 Encounter for antineoplastic chemotherapy: Secondary | ICD-10-CM | POA: Insufficient documentation

## 2020-09-25 DIAGNOSIS — Z79899 Other long term (current) drug therapy: Secondary | ICD-10-CM | POA: Diagnosis not present

## 2020-09-25 LAB — CBC WITH DIFFERENTIAL (CANCER CENTER ONLY)
Abs Immature Granulocytes: 0.03 10*3/uL (ref 0.00–0.07)
Basophils Absolute: 0 10*3/uL (ref 0.0–0.1)
Basophils Relative: 1 %
Eosinophils Absolute: 0 10*3/uL (ref 0.0–0.5)
Eosinophils Relative: 1 %
HCT: 34.9 % — ABNORMAL LOW (ref 36.0–46.0)
Hemoglobin: 10.9 g/dL — ABNORMAL LOW (ref 12.0–15.0)
Immature Granulocytes: 1 %
Lymphocytes Relative: 18 %
Lymphs Abs: 0.7 10*3/uL (ref 0.7–4.0)
MCH: 20.7 pg — ABNORMAL LOW (ref 26.0–34.0)
MCHC: 31.2 g/dL (ref 30.0–36.0)
MCV: 66.3 fL — ABNORMAL LOW (ref 80.0–100.0)
Monocytes Absolute: 0.6 10*3/uL (ref 0.1–1.0)
Monocytes Relative: 16 %
Neutro Abs: 2.4 10*3/uL (ref 1.7–7.7)
Neutrophils Relative %: 63 %
Platelet Count: 139 10*3/uL — ABNORMAL LOW (ref 150–400)
RBC: 5.26 MIL/uL — ABNORMAL HIGH (ref 3.87–5.11)
RDW: 18.1 % — ABNORMAL HIGH (ref 11.5–15.5)
WBC Count: 3.7 10*3/uL — ABNORMAL LOW (ref 4.0–10.5)
nRBC: 0 % (ref 0.0–0.2)

## 2020-09-25 LAB — CMP (CANCER CENTER ONLY)
ALT: 57 U/L — ABNORMAL HIGH (ref 0–44)
AST: 65 U/L — ABNORMAL HIGH (ref 15–41)
Albumin: 3.5 g/dL (ref 3.5–5.0)
Alkaline Phosphatase: 271 U/L — ABNORMAL HIGH (ref 38–126)
Anion gap: 9 (ref 5–15)
BUN: <4 mg/dL — ABNORMAL LOW (ref 6–20)
CO2: 22 mmol/L (ref 22–32)
Calcium: 8.7 mg/dL — ABNORMAL LOW (ref 8.9–10.3)
Chloride: 102 mmol/L (ref 98–111)
Creatinine: 0.58 mg/dL (ref 0.44–1.00)
GFR, Estimated: >60 mL/min (ref >60)
Glucose, Bld: 107 mg/dL — ABNORMAL HIGH (ref 70–99)
Potassium: 4.2 mmol/L (ref 3.5–5.1)
Sodium: 133 mmol/L — ABNORMAL LOW (ref 135–145)
Total Bilirubin: 0.8 mg/dL (ref 0.3–1.2)
Total Protein: 6 g/dL — ABNORMAL LOW (ref 6.5–8.1)

## 2020-09-25 LAB — TOTAL PROTEIN, URINE DIPSTICK: Protein, ur: NEGATIVE mg/dL

## 2020-09-25 LAB — CEA (IN HOUSE-CHCC): CEA (CHCC-In House): 5.01 ng/mL — ABNORMAL HIGH (ref 0.00–5.00)

## 2020-09-25 MED ORDER — SODIUM CHLORIDE 0.9% FLUSH
10.0000 mL | INTRAVENOUS | Status: DC | PRN
  Filled 2020-09-25: qty 10

## 2020-09-25 MED ORDER — FOSAPREPITANT DIMEGLUMINE INJECTION 150 MG
150.0000 mg | Freq: Once | INTRAVENOUS | Status: AC
  Administered 2020-09-25: 150 mg via INTRAVENOUS
  Filled 2020-09-25: qty 150

## 2020-09-25 MED ORDER — HEPARIN SOD (PORK) LOCK FLUSH 100 UNIT/ML IV SOLN
500.0000 [IU] | Freq: Once | INTRAVENOUS | Status: DC | PRN
  Filled 2020-09-25: qty 5

## 2020-09-25 MED ORDER — SODIUM CHLORIDE 0.9 % IV SOLN
2000.0000 mg/m2 | INTRAVENOUS | Status: DC
  Administered 2020-09-25: 3200 mg via INTRAVENOUS
  Filled 2020-09-25: qty 64

## 2020-09-25 MED ORDER — DEXTROSE 5 % IV SOLN
Freq: Once | INTRAVENOUS | Status: AC
Start: 2020-09-25 — End: 2020-09-25
  Filled 2020-09-25: qty 250

## 2020-09-25 MED ORDER — SODIUM CHLORIDE 0.9 % IV SOLN
400.0000 mg | Freq: Once | INTRAVENOUS | Status: AC
  Administered 2020-09-25: 400 mg via INTRAVENOUS
  Filled 2020-09-25: qty 16

## 2020-09-25 MED ORDER — SODIUM CHLORIDE 0.9 % IV SOLN
Freq: Once | INTRAVENOUS | Status: AC
  Filled 2020-09-25: qty 250

## 2020-09-25 MED ORDER — SODIUM CHLORIDE 0.9% FLUSH
10.0000 mL | Freq: Once | INTRAVENOUS | Status: AC
  Administered 2020-09-25: 10 mL
  Filled 2020-09-25: qty 10

## 2020-09-25 MED ORDER — PALONOSETRON HCL INJECTION 0.25 MG/5ML
INTRAVENOUS | Status: AC
  Filled 2020-09-25: qty 5

## 2020-09-25 MED ORDER — SODIUM CHLORIDE 0.9 % IV SOLN
10.0000 mg | Freq: Once | INTRAVENOUS | Status: AC
  Administered 2020-09-25: 10 mg via INTRAVENOUS
  Filled 2020-09-25: qty 10

## 2020-09-25 MED ORDER — PALONOSETRON HCL INJECTION 0.25 MG/5ML
0.2500 mg | Freq: Once | INTRAVENOUS | Status: AC
  Administered 2020-09-25: 0.25 mg via INTRAVENOUS

## 2020-09-25 MED ORDER — OXALIPLATIN CHEMO INJECTION 100 MG/20ML
85.0000 mg/m2 | Freq: Once | INTRAVENOUS | Status: AC
  Administered 2020-09-25: 135 mg via INTRAVENOUS
  Filled 2020-09-25: qty 20

## 2020-09-25 MED ORDER — DEXTROSE 5 % IV SOLN
200.0000 mg/m2 | Freq: Once | INTRAVENOUS | Status: AC
  Administered 2020-09-25: 318 mg via INTRAVENOUS
  Filled 2020-09-25: qty 15.9

## 2020-09-25 NOTE — Patient Instructions (Signed)
[facility] Discharge Instructions for Patients Receiving Chemotherapy  Today you received the following chemotherapy agents: Bevacizumab, Oxaliplatin, Leucovorin, and Fluorouracil   To help prevent nausea and vomiting after your treatment, we encourage you to take your nausea medication  as prescribed.    If you develop nausea and vomiting that is not controlled by your nausea medication, call the clinic.   BELOW ARE SYMPTOMS THAT SHOULD BE REPORTED IMMEDIATELY:  *FEVER GREATER THAN 100.5 F  *CHILLS WITH OR WITHOUT FEVER  NAUSEA AND VOMITING THAT IS NOT CONTROLLED WITH YOUR NAUSEA MEDICATION  *UNUSUAL SHORTNESS OF BREATH  *UNUSUAL BRUISING OR BLEEDING  TENDERNESS IN MOUTH AND THROAT WITH OR WITHOUT PRESENCE OF ULCERS  *URINARY PROBLEMS  *BOWEL PROBLEMS  UNUSUAL RASH Items with * indicate a potential emergency and should be followed up as soon as possible.  Feel free to call the clinic should you have any questions or concerns. The clinic phone number is [phone].  Please show the [facility] at check-in to the Emergency Department and triage nurse.

## 2020-09-25 NOTE — Progress Notes (Signed)
[facility] OFFICE PROGRESS NOTE   Diagnosis: Rectal cancer  INTERVAL HISTORY:   Tina Morales completed another treatment with FOLFOX/Avastin on 09/04/2020.  She reports constipation immediately following chemotherapy as has occurred in the past.  She also had other episodes of decreased stool output over the past few weeks.  She had an episode of nausea/vomiting in the a.m. after drinking coffee.  Mild progression of numbness in the feet.  No neuropathy symptoms in the hands. She noted erythematous areas at the antecubital fossa bilaterally while in the shower yesterday.  The left side was painful and has improved today.  She is not aware of trauma in these areas. Objective:  Vital signs in last 24 hours:  Blood pressure (!) 134/114, pulse (!) 125, temperature 98.3 F (36.8 C), temperature source Tympanic, resp. rate 18, height _0  (1.702 m), weight 130 lb 9.6 oz (59.2 kg), SpO2 99 %.    HEENT: No thrush or ulcers Resp: Lungs clear bilaterally Cardio: Regular rate and rhythm GI: No hepatomegaly, left lower quadrant colostomy Vascular: No leg edema  Skin: Plaques of dry flat erythema at the antecubital area bilaterally, no palpable cord, no mass  Portacath/PICC-without erythema  Lab Results:  Lab Results  Component Value Date   WBC 3.7 (L) 09/25/2020   HGB 10.9 (L) 09/25/2020   HCT 34.9 (L) 09/25/2020   MCV 66.3 (L) 09/25/2020   PLT 139 (L) 09/25/2020   NEUTROABS 2.4 09/25/2020    CMP  Lab Results  Component Value Date   NA 133 (L) 09/04/2020   K 4.2 09/04/2020   CL 102 09/04/2020   CO2 22 09/04/2020   GLUCOSE 83 09/04/2020   BUN 4 (L) 09/04/2020   CREATININE 0.56 09/04/2020   CALCIUM 8.5 (L) 09/04/2020   PROT 6.0 (L) 09/04/2020   ALBUMIN 3.5 09/04/2020   AST 63 (H) 09/04/2020   ALT 54 (H) 09/04/2020   ALKPHOS 260 (H) 09/04/2020   BILITOT 0.6 09/04/2020   GFRNONAA >60 09/04/2020   GFRAA >60 04/10/2020    Lab Results  Component Value Date    CEA1 4.73 09/04/2020     Medications: I have reviewed the patient's current medications.   Assessment/Plan: 1. Rectal cancer-rectal mass noted on digital exam 02/10/2018, colonoscopy confirmed a him my circumferential mass in the rectum ? 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 CTs 02/10/2018-anterior rectal wall thickening, pulmonary nodules measuring up to 9 mm concerning for metastases, few round perirectal lymph nodes ? Pelvic MRI 02/20/2018-hypermetabolic low rectal mass extending to the posterior vagina with 2 small enlarged perirectal lymph nodes, T4b,N1-2.3 cm from the anal verge ? PET scan 02/23/2018-hypermetabolic rectal mass, 8 mm hypermetabolic lingular nodule, scattered small bilateral lung nodules, some calcified, a few with mildly increased activity ? Cycle 1 FOLFOXIRI8/21/2019 ? Cycle 5 FOLFOXIRI10/16/2019 ? CTs 05/01/2018-significant interval response to therapy with decreased size of the primary rectal mass lesion. Decreasing perirectal lymphadenopathy. Decreased and/or resolved pulmonary nodules. No new sites of disease identified. ? Cycle 6 FOLFOXIRI10/31/2019 ? Radiation/Xeloda 06/12/2018-completed 07/21/2018 ? Xeloda dose reduced 07/10/2018 due to mucositis and diarrhea ? CTs 08/07/2018-compared to 02/10/2018-resolved and decreased pulmonary nodules, few tiny residual noncalcified nodules, decreased 20 perirectal lymph nodes, soft tissue of the rectum indistinguishable from posterior wall of vagina-Vaginal involvement by tumor? ? 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 CT chest 09/29/2018-enlargement of lung nodules, not a candidate for SBRT based on discussion in GI tumor conference and with radiation oncology ? CT chest 11/27/18 - interval growth of numerous pulmonary metastases bilaterally compared to 09/29/18, no new pulmonary mets ? Cycle 1FOLFOXIRI 11/29/18 ? Cycle 2 FOLFOXIRI6/10/2018 ? Cycle 3 FOLFOXIRI 12/28/2018 ? Cycle 4 FOLFIRINOX 01/11/2019 ? CTs 01/24/2019-significant improvement of bilateral pulmonary metastases. ? Cycle 1 FOLFIRI 02/01/2019 ? Cycle 2 FOLFIRI 02/22/2019 ? Cycle 3 FOLFIRI 03/15/2019 ? Cycle 4 FOLFIRI 04/05/2019 ? Cycle 5 FOLFIRI 04/26/2019 ? CTs 05/14/2019-slight enlargement of right lung nodules, no other evidence of disease progression ? Cycle 6 FOLFIRI 05/17/2019 ? Cycle 7 FOLFIRI plus Avastin 06/13/2019 ? Cycle 8 FOLFIRI plus Avastin 07/09/2019 ? Cycle 9 FOLFIRI plus Avastin 07/25/2019 ? Cycle 10 FOLFIRI plus Avastin 08/09/2019 ? Cycle 11 FOLFIRI plus Avastin 08/22/2019 ? CTs 09/07/2019-most pulmonary nodules are stable. 1 nodule right lower lobe slightly larger. No new lung lesions. ? Cycle 12 FOLFIRI plus Avastin 09/13/2019 ? Cycle 13 FOLFIRI plus Avastin 10/04/2019 ? Cycle 14 FOLFIRI plus Avastin 10/25/2019 ? Cycle 15 FOLFIRI plus Avastin 11/14/2019 ? Cycle 16 FOLFIRI plus Avastin 12/06/2019 ? CTs neck, chest, abdomen, pelvis 12/21/2019-neck negative; mildly progressive pulmonary metastases; abdomen and pelvis negative for evidence of metastatic disease. ? Cycle 1 FOLFOX/Avastin 12/27/2019 ? Cycle 2 FOLFOX/Avastin 01/17/2020 ? Cycle 3 FOLFOX/Avastin 02/07/2020 ? Cycle 4 FOLFOX 02/28/2020, Avastin held due to need for dental evaluation possible extractions ? Cycle 5 FOLFOX 03/20/2020, Avastin held pending dental evaluation ? CTs 04/04/2020-increased size of pulmonary nodules, no evidence of metastatic disease in the abdomen or pelvis ? Cycle 6 FOLFOX 05/22/2020, Avastin held  secondary to dental extractions ? Cycle 7 FOLFOX/Avastin 06/12/2020 ? Cycle 8 FOLFOX/Avastin 06/30/2020 ? Cycle 9 FOLFOX/Avastin 07/24/2020 ? Cycle 10 FOLFOX/Avastin 08/14/2020 ? CTs 09/01/2020-improvement in bilateral pulmonary metastases. ? Cycle 11 FOLFOX/Avastin 09/04/2020 ? 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 Imodium as needed. 6.Mucositis secondary to Xeloda. Improved 07/19/2018. 7. History of migraines 8. 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 appears stable.  She is tolerating the FOLFOX/Avastin well.  She does not have significant neuropathy symptoms.  She will complete another treatment today.  The etiology of the erythematous plaques is unclear.  This could be related to contact dermatitis, tinea corpora, or potentially a 5-FU rash.  We will observe this for now.  She will return for an office visit in the next cycle of chemotherapy in 3 weeks.  She will increase the use of MiraLAX.  She will change to a liquid diet if she has recurrent constipation and nausea.  Betsy Coder, MD  09/25/2020  10:33 AM

## 2020-09-25 NOTE — Progress Notes (Signed)
Notified patient that per recommendations, it is suggested that anyone diagnosed with cancer < 53 years old to offer genetics counseling. Patient would like to think about it.

## 2020-09-25 NOTE — Patient Instructions (Signed)

## 2020-09-27 ENCOUNTER — Inpatient Hospital Stay: Payer: BC Managed Care – PPO

## 2020-09-27 ENCOUNTER — Other Ambulatory Visit: Payer: Self-pay

## 2020-09-27 VITALS — BP 110/78 | HR 79 | Temp 98.1°F | Resp 20

## 2020-09-27 DIAGNOSIS — C20 Malignant neoplasm of rectum: Secondary | ICD-10-CM | POA: Diagnosis not present

## 2020-09-27 MED ORDER — HEPARIN SOD (PORK) LOCK FLUSH 100 UNIT/ML IV SOLN
500.0000 [IU] | Freq: Once | INTRAVENOUS | Status: AC
  Administered 2020-09-27: 500 [IU]
  Filled 2020-09-27: qty 5

## 2020-09-27 MED ORDER — SODIUM CHLORIDE 0.9% FLUSH
10.0000 mL | Freq: Once | INTRAVENOUS | Status: AC
  Administered 2020-09-27: 10 mL
  Filled 2020-09-27: qty 10

## 2020-09-27 NOTE — Patient Instructions (Signed)

## 2020-10-06 ENCOUNTER — Telehealth: Payer: Self-pay | Admitting: Oncology

## 2020-10-06 NOTE — Telephone Encounter (Signed)
My chart message was send to patient regarding appointments location changes

## 2020-10-11 ENCOUNTER — Other Ambulatory Visit: Payer: Self-pay | Admitting: Oncology

## 2020-10-15 ENCOUNTER — Inpatient Hospital Stay: Payer: BC Managed Care – PPO

## 2020-10-15 ENCOUNTER — Inpatient Hospital Stay (HOSPITAL_BASED_OUTPATIENT_CLINIC_OR_DEPARTMENT_OTHER): Payer: BC Managed Care – PPO | Admitting: Nurse Practitioner

## 2020-10-15 ENCOUNTER — Other Ambulatory Visit: Payer: Self-pay

## 2020-10-15 ENCOUNTER — Encounter: Payer: Self-pay | Admitting: Nurse Practitioner

## 2020-10-15 ENCOUNTER — Inpatient Hospital Stay: Payer: BC Managed Care – PPO | Attending: Oncology

## 2020-10-15 VITALS — HR 110

## 2020-10-15 VITALS — BP 128/82 | HR 118 | Temp 97.8°F | Resp 18 | Ht 67.0 in | Wt 131.2 lb

## 2020-10-15 DIAGNOSIS — C20 Malignant neoplasm of rectum: Secondary | ICD-10-CM

## 2020-10-15 DIAGNOSIS — G620 Drug-induced polyneuropathy: Secondary | ICD-10-CM

## 2020-10-15 DIAGNOSIS — T451X5A Adverse effect of antineoplastic and immunosuppressive drugs, initial encounter: Secondary | ICD-10-CM

## 2020-10-15 DIAGNOSIS — Z79899 Other long term (current) drug therapy: Secondary | ICD-10-CM | POA: Diagnosis not present

## 2020-10-15 DIAGNOSIS — Z87891 Personal history of nicotine dependence: Secondary | ICD-10-CM | POA: Diagnosis not present

## 2020-10-15 DIAGNOSIS — Z5111 Encounter for antineoplastic chemotherapy: Secondary | ICD-10-CM | POA: Diagnosis present

## 2020-10-15 DIAGNOSIS — Z5112 Encounter for antineoplastic immunotherapy: Secondary | ICD-10-CM | POA: Insufficient documentation

## 2020-10-15 DIAGNOSIS — Z95828 Presence of other vascular implants and grafts: Secondary | ICD-10-CM

## 2020-10-15 LAB — CBC WITH DIFFERENTIAL (CANCER CENTER ONLY)
Abs Immature Granulocytes: 0.05 10*3/uL (ref 0.00–0.07)
Basophils Absolute: 0 10*3/uL (ref 0.0–0.1)
Basophils Relative: 1 %
Eosinophils Absolute: 0.1 10*3/uL (ref 0.0–0.5)
Eosinophils Relative: 2 %
HCT: 37.5 % (ref 36.0–46.0)
Hemoglobin: 11.8 g/dL — ABNORMAL LOW (ref 12.0–15.0)
Immature Granulocytes: 1 %
Lymphocytes Relative: 27 %
Lymphs Abs: 1.2 10*3/uL (ref 0.7–4.0)
MCH: 21.3 pg — ABNORMAL LOW (ref 26.0–34.0)
MCHC: 31.5 g/dL (ref 30.0–36.0)
MCV: 67.7 fL — ABNORMAL LOW (ref 80.0–100.0)
Monocytes Absolute: 0.9 10*3/uL (ref 0.1–1.0)
Monocytes Relative: 19 %
Neutro Abs: 2.4 10*3/uL (ref 1.7–7.7)
Neutrophils Relative %: 50 %
Platelet Count: 203 10*3/uL (ref 150–400)
RBC: 5.54 MIL/uL — ABNORMAL HIGH (ref 3.87–5.11)
RDW: 20 % — ABNORMAL HIGH (ref 11.5–15.5)
WBC Count: 4.6 10*3/uL (ref 4.0–10.5)
nRBC: 0 % (ref 0.0–0.2)

## 2020-10-15 LAB — CMP (CANCER CENTER ONLY)
ALT: 31 U/L (ref 0–44)
AST: 45 U/L — ABNORMAL HIGH (ref 15–41)
Albumin: 3.8 g/dL (ref 3.5–5.0)
Alkaline Phosphatase: 256 U/L — ABNORMAL HIGH (ref 38–126)
Anion gap: 9 (ref 5–15)
BUN: 10 mg/dL (ref 6–20)
CO2: 23 mmol/L (ref 22–32)
Calcium: 8.7 mg/dL — ABNORMAL LOW (ref 8.9–10.3)
Chloride: 99 mmol/L (ref 98–111)
Creatinine: 0.43 mg/dL — ABNORMAL LOW (ref 0.44–1.00)
GFR, Estimated: >60 mL/min (ref >60)
Glucose, Bld: 117 mg/dL — ABNORMAL HIGH (ref 70–99)
Potassium: 4.1 mmol/L (ref 3.5–5.1)
Sodium: 131 mmol/L — ABNORMAL LOW (ref 135–145)
Total Bilirubin: 0.9 mg/dL (ref 0.3–1.2)
Total Protein: 6.1 g/dL — ABNORMAL LOW (ref 6.5–8.1)

## 2020-10-15 LAB — TOTAL PROTEIN, URINE DIPSTICK: Protein, ur: NEGATIVE mg/dL

## 2020-10-15 MED ORDER — LEUCOVORIN CALCIUM INJECTION 350 MG
200.0000 mg/m2 | Freq: Once | INTRAVENOUS | Status: AC
  Administered 2020-10-15: 318 mg via INTRAVENOUS
  Filled 2020-10-15: qty 15.9

## 2020-10-15 MED ORDER — SODIUM CHLORIDE 0.9 % IV SOLN
2000.0000 mg/m2 | INTRAVENOUS | Status: DC
  Administered 2020-10-15: 3200 mg via INTRAVENOUS
  Filled 2020-10-15: qty 64

## 2020-10-15 MED ORDER — SODIUM CHLORIDE 0.9 % IV SOLN
7.5000 mg/kg | Freq: Once | INTRAVENOUS | Status: AC
  Administered 2020-10-15: 450 mg via INTRAVENOUS
  Filled 2020-10-15: qty 16

## 2020-10-15 MED ORDER — SODIUM CHLORIDE 0.9 % IV SOLN
Freq: Once | INTRAVENOUS | Status: AC
  Filled 2020-10-15: qty 250

## 2020-10-15 MED ORDER — SODIUM CHLORIDE 0.9% FLUSH
10.0000 mL | Freq: Once | INTRAVENOUS | Status: AC
  Administered 2020-10-15: 10 mL
  Filled 2020-10-15: qty 10

## 2020-10-15 MED ORDER — OXALIPLATIN CHEMO INJECTION 100 MG/20ML
85.0000 mg/m2 | Freq: Once | INTRAVENOUS | Status: AC
  Administered 2020-10-15: 135 mg via INTRAVENOUS
  Filled 2020-10-15: qty 10

## 2020-10-15 MED ORDER — SODIUM CHLORIDE 0.9 % IV SOLN
10.0000 mg | Freq: Once | INTRAVENOUS | Status: AC
  Administered 2020-10-15: 10 mg via INTRAVENOUS
  Filled 2020-10-15: qty 1

## 2020-10-15 MED ORDER — DEXTROSE 5 % IV SOLN
Freq: Once | INTRAVENOUS | Status: AC
Start: 2020-10-15 — End: 2020-10-15
  Filled 2020-10-15: qty 250

## 2020-10-15 MED ORDER — FOSAPREPITANT DIMEGLUMINE INJECTION 150 MG
150.0000 mg | Freq: Once | INTRAVENOUS | Status: AC
  Administered 2020-10-15: 150 mg via INTRAVENOUS
  Filled 2020-10-15: qty 5

## 2020-10-15 MED ORDER — PALONOSETRON HCL INJECTION 0.25 MG/5ML
0.2500 mg | Freq: Once | INTRAVENOUS | Status: AC
Start: 2020-10-15 — End: 2020-10-15
  Administered 2020-10-15: 0.25 mg via INTRAVENOUS

## 2020-10-15 NOTE — Progress Notes (Signed)
Per Lattie Haw, NP: okay to treat with elevated HR of 110

## 2020-10-15 NOTE — Patient Instructions (Signed)
[facility] Discharge Instructions for Patients Receiving Chemotherapy  Today you received the following chemotherapy agents Bevacizumab-bvzr (ZIRABEV), Oxaliplatin (ELOXATIN), Leucovorin & Flourouracil (ADRUCIL).  To help prevent nausea and vomiting after your treatment, we encourage you to take your nausea medication as prescribed.   If you develop nausea and vomiting that is not controlled by your nausea medication, call the clinic.   BELOW ARE SYMPTOMS THAT SHOULD BE REPORTED IMMEDIATELY:  *FEVER GREATER THAN 100.5 F  *CHILLS WITH OR WITHOUT FEVER  NAUSEA AND VOMITING THAT IS NOT CONTROLLED WITH YOUR NAUSEA MEDICATION  *UNUSUAL SHORTNESS OF BREATH  *UNUSUAL BRUISING OR BLEEDING  TENDERNESS IN MOUTH AND THROAT WITH OR WITHOUT PRESENCE OF ULCERS  *URINARY PROBLEMS  *BOWEL PROBLEMS  UNUSUAL RASH Items with * indicate a potential emergency and should be followed up as soon as possible.  Feel free to call the clinic should you have any questions or concerns at The clinic phone number is [phone].  Please show the [facility] at check-in to the Emergency Department and triage nurse.

## 2020-10-15 NOTE — Patient Instructions (Signed)

## 2020-10-15 NOTE — Progress Notes (Addendum)
[facility] OFFICE PROGRESS NOTE   Diagnosis: Rectal cancer  INTERVAL HISTORY:   Tina Morales returns as scheduled.  She completed cycle 12 FOLFOX/Avastin 09/25/2020.  She had mild nausea.  She was constipated for a few days after treatment.  She typically begins a laxative regimen on the day of treatment.  No mouth sores.  She notes increased neuropathy symptoms in the feet.  Mild numbness in the fingertips.  Symptoms do not interfere with activity.  She denies bleeding.  Appetite varies.  She is having some abdominal discomfort at present.  She attributes this to the colostomy being "active" this morning.  Symptoms not unusual.  Objective:  Vital signs in last 24 hours:  Blood pressure 128/82, pulse (!) 118, temperature 97.8 F (36.6 C), temperature source Tympanic, resp. rate 18, height $RemoveBe'5\' 7"'xkACQjMra$  (1.702 m), weight 131 lb 3.2 oz (59.5 kg), SpO2 98 %.    HEENT: No thrush or ulcers. Resp: Lungs clear bilaterally. Cardio: Regular, tachycardic. GI: Abdomen soft, mild tenderness at the low abdomen.  Left lower quadrant colostomy.  No hepatomegaly. Vascular: No leg edema. Neuro: Vibratory sense mildly decreased over the fingertips per tuning fork exam. Skin: Palms without erythema. Port-A-Cath without erythema.   Lab Results:  Lab Results  Component Value Date   WBC 4.6 10/15/2020   HGB 11.8 (L) 10/15/2020   HCT 37.5 10/15/2020   MCV 67.7 (L) 10/15/2020   PLT 203 10/15/2020   NEUTROABS 2.4 10/15/2020    Imaging:  No results found.  Medications: I have reviewed the patient's current medications.  Assessment/Plan: 1. Rectal cancer-rectal mass noted on digital exam 02/10/2018, colonoscopy confirmed a him my circumferential mass in the rectum ? 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 CTs 02/10/2018-anterior rectal wall thickening, pulmonary nodules measuring up to 9 mm concerning for metastases, few round perirectal lymph nodes ? Pelvic MRI 02/20/2018-hypermetabolic low rectal mass extending to the posterior vagina with 2 small enlarged perirectal lymph nodes, T4b,N1-2.3 cm from the anal verge ? PET scan 02/23/2018-hypermetabolic rectal mass, 8 mm hypermetabolic lingular nodule, scattered small bilateral lung nodules, some calcified, a few with mildly increased activity ? Cycle 1 FOLFOXIRI8/21/2019 ? Cycle 5 FOLFOXIRI10/16/2019 ? CTs 05/01/2018-significant interval response to therapy with decreased size of the primary rectal mass lesion. Decreasing perirectal lymphadenopathy. Decreased and/or resolved pulmonary nodules. No new sites of disease identified. ? Cycle 6 FOLFOXIRI10/31/2019 ? Radiation/Xeloda 06/12/2018-completed 07/21/2018 ? Xeloda dose reduced 07/10/2018 due to mucositis and diarrhea ? CTs 08/07/2018-compared to 02/10/2018-resolved and decreased pulmonary nodules, few tiny residual noncalcified nodules, decreased 20 perirectal lymph nodes, soft tissue of the rectum indistinguishable from posterior wall of vagina-Vaginal involvement by tumor? ? 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 CT chest 09/29/2018-enlargement of lung nodules, not a candidate for SBRT based on discussion in GI tumor conference and with radiation oncology ? CT chest 11/27/18 - interval growth of numerous pulmonary metastases bilaterally compared to 09/29/18, no new pulmonary mets ? Cycle 1FOLFOXIRI 11/29/18 ? Cycle 2 FOLFOXIRI6/10/2018 ? Cycle 3 FOLFOXIRI 12/28/2018 ? Cycle 4 FOLFIRINOX 01/11/2019 ? CTs  01/24/2019-significant improvement of bilateral pulmonary metastases. ? Cycle 1 FOLFIRI 02/01/2019 ? Cycle 2 FOLFIRI 02/22/2019 ? Cycle  3 FOLFIRI 03/15/2019 ? Cycle 4 FOLFIRI 04/05/2019 ? Cycle 5 FOLFIRI 04/26/2019 ? CTs 05/14/2019-slight enlargement of right lung nodules, no other evidence of disease progression ? Cycle 6 FOLFIRI 05/17/2019 ? Cycle 7 FOLFIRI plus Avastin 06/13/2019 ? Cycle 8 FOLFIRI plus Avastin 07/09/2019 ? Cycle 9 FOLFIRI plus Avastin 07/25/2019 ? Cycle 10 FOLFIRI plus Avastin 08/09/2019 ? Cycle 11 FOLFIRI plus Avastin 08/22/2019 ? CTs 09/07/2019-most pulmonary nodules are stable. 1 nodule right lower lobe slightly larger. No new lung lesions. ? Cycle 12 FOLFIRI plus Avastin 09/13/2019 ? Cycle 13 FOLFIRI plus Avastin 10/04/2019 ? Cycle 14 FOLFIRI plus Avastin 10/25/2019 ? Cycle 15 FOLFIRI plus Avastin 11/14/2019 ? Cycle 16 FOLFIRI plus Avastin 12/06/2019 ? CTs neck, chest, abdomen, pelvis 12/21/2019-neck negative; mildly progressive pulmonary metastases; abdomen and pelvis negative for evidence of metastatic disease. ? Cycle 1 FOLFOX/Avastin 12/27/2019 ? Cycle 2 FOLFOX/Avastin 01/17/2020 ? Cycle 3 FOLFOX/Avastin 02/07/2020 ? Cycle 4 FOLFOX 02/28/2020, Avastin held due to need for dental evaluation possible extractions ? Cycle 5 FOLFOX 03/20/2020, Avastin held pending dental evaluation ? CTs 04/04/2020-increased size of pulmonary nodules, no evidence of metastatic disease in the abdomen or pelvis ? Cycle 6 FOLFOX 05/22/2020, Avastin held secondary to dental extractions ? Cycle 7 FOLFOX/Avastin 06/12/2020 ? Cycle 8 FOLFOX/Avastin 06/30/2020 ? Cycle 9 FOLFOX/Avastin 07/24/2020 ? Cycle 10 FOLFOX/Avastin 08/14/2020 ? CTs 09/01/2020-improvement in bilateral pulmonary metastases. ? Cycle 11 FOLFOX/Avastin 09/04/2020 ? Cycle 12 FOLFOX/Avastin 09/25/2020 ? 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 Imodium as needed. 6.Mucositis secondary to Xeloda. Improved 07/19/2018. 7. History of migraines 8. 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  She has completed 12 cycles of FOLFOX/Avastin.  There is no clinical evidence of disease progression.  Plan to proceed with cycle 13 today as scheduled.    We discussed the increase in neuropathy symptoms, mainly in the feet.  We discussed holding oxaliplatin with today's treatment.  She understands the risk of symptoms worsening with continuation of Oxaliplatin.  She is willing to accept this risk.  We reviewed the CBC from today.  Counts adequate to proceed with treatment.  She will return for lab, follow-up, cycle 14 FOLFOX/Avastin in 3 weeks.  Patient seen with Dr. Luberta Robertson ANP/GNP-BC   10/15/2020  11:25 AM  This was a shared visit with Ned Card.  Tina Morales appears unchanged.  She has developed mild progression of neuropathy symptoms in the feet.  We discussed the risk/benefit of continuing oxaliplatin.  She understands further treatment with oxaliplatin could lead to worsening or permanent neuropathy.  She would like to proceed with oxaliplatin.  I was present for greater than 50% of today's visit.  I perform medical decision making.  Julieanne Manson, MD

## 2020-10-16 ENCOUNTER — Ambulatory Visit: Payer: BC Managed Care – PPO

## 2020-10-16 ENCOUNTER — Ambulatory Visit: Payer: BC Managed Care – PPO | Admitting: Nurse Practitioner

## 2020-10-16 ENCOUNTER — Other Ambulatory Visit: Payer: BC Managed Care – PPO

## 2020-10-16 LAB — CEA (IN HOUSE-CHCC): CEA (CHCC-In House): 5.64 ng/mL — ABNORMAL HIGH (ref 0.00–5.00)

## 2020-10-17 ENCOUNTER — Inpatient Hospital Stay: Payer: BC Managed Care – PPO

## 2020-10-17 ENCOUNTER — Other Ambulatory Visit: Payer: Self-pay

## 2020-10-17 VITALS — BP 140/98 | HR 112 | Temp 98.0°F | Resp 18

## 2020-10-17 DIAGNOSIS — C20 Malignant neoplasm of rectum: Secondary | ICD-10-CM

## 2020-10-17 MED ORDER — SODIUM CHLORIDE 0.9% FLUSH
10.0000 mL | INTRAVENOUS | Status: DC | PRN
  Administered 2020-10-17: 10 mL
  Filled 2020-10-17: qty 10

## 2020-10-17 MED ORDER — HEPARIN SOD (PORK) LOCK FLUSH 100 UNIT/ML IV SOLN
500.0000 [IU] | Freq: Once | INTRAVENOUS | Status: AC | PRN
  Administered 2020-10-17: 500 [IU]
  Filled 2020-10-17: qty 5

## 2020-10-17 NOTE — Progress Notes (Signed)
Tina Morales presented for Portacath de-access and flush.  Portacath located right chest wall accessed Good blood return present. Portacath flushed with 76ml NS and 500U/68ml Heparin and needle removed intact.  Procedure tolerated well and without incident.  Stable during procedure.  AVS reviewed.  Vital signs stable.  Discharged in stable condition ambulatory.

## 2020-10-17 NOTE — Patient Instructions (Signed)
5FU ambulatory pump removed.  Follow up as scheduled.  Please call the clinic if you have any questions or concerns.

## 2020-11-02 ENCOUNTER — Other Ambulatory Visit: Payer: Self-pay | Admitting: Oncology

## 2020-11-05 ENCOUNTER — Inpatient Hospital Stay: Payer: BC Managed Care – PPO

## 2020-11-05 ENCOUNTER — Inpatient Hospital Stay (HOSPITAL_BASED_OUTPATIENT_CLINIC_OR_DEPARTMENT_OTHER): Payer: BC Managed Care – PPO | Admitting: Oncology

## 2020-11-05 ENCOUNTER — Other Ambulatory Visit: Payer: Self-pay

## 2020-11-05 VITALS — BP 140/98 | HR 104 | Temp 97.7°F | Resp 18 | Ht 67.0 in | Wt 136.2 lb

## 2020-11-05 DIAGNOSIS — C20 Malignant neoplasm of rectum: Secondary | ICD-10-CM

## 2020-11-05 LAB — CMP (CANCER CENTER ONLY)
ALT: 30 U/L (ref 0–44)
AST: 46 U/L — ABNORMAL HIGH (ref 15–41)
Albumin: 3.4 g/dL — ABNORMAL LOW (ref 3.5–5.0)
Alkaline Phosphatase: 284 U/L — ABNORMAL HIGH (ref 38–126)
Anion gap: 6 (ref 5–15)
BUN: 5 mg/dL — ABNORMAL LOW (ref 6–20)
CO2: 25 mmol/L (ref 22–32)
Calcium: 8.5 mg/dL — ABNORMAL LOW (ref 8.9–10.3)
Chloride: 96 mmol/L — ABNORMAL LOW (ref 98–111)
Creatinine: 0.32 mg/dL — ABNORMAL LOW (ref 0.44–1.00)
GFR, Estimated: >60 mL/min (ref >60)
Glucose, Bld: 106 mg/dL — ABNORMAL HIGH (ref 70–99)
Potassium: 4.4 mmol/L (ref 3.5–5.1)
Sodium: 127 mmol/L — ABNORMAL LOW (ref 135–145)
Total Bilirubin: 0.7 mg/dL (ref 0.3–1.2)
Total Protein: 5.2 g/dL — ABNORMAL LOW (ref 6.5–8.1)

## 2020-11-05 LAB — CBC WITH DIFFERENTIAL (CANCER CENTER ONLY)
Abs Immature Granulocytes: 0.02 10*3/uL (ref 0.00–0.07)
Basophils Absolute: 0 10*3/uL (ref 0.0–0.1)
Basophils Relative: 0 %
Eosinophils Absolute: 0 10*3/uL (ref 0.0–0.5)
Eosinophils Relative: 1 %
HCT: 33.6 % — ABNORMAL LOW (ref 36.0–46.0)
Hemoglobin: 10.7 g/dL — ABNORMAL LOW (ref 12.0–15.0)
Immature Granulocytes: 1 %
Lymphocytes Relative: 20 %
Lymphs Abs: 0.6 10*3/uL — ABNORMAL LOW (ref 0.7–4.0)
MCH: 21.5 pg — ABNORMAL LOW (ref 26.0–34.0)
MCHC: 31.8 g/dL (ref 30.0–36.0)
MCV: 67.5 fL — ABNORMAL LOW (ref 80.0–100.0)
Monocytes Absolute: 0.6 10*3/uL (ref 0.1–1.0)
Monocytes Relative: 21 %
Neutro Abs: 1.8 10*3/uL (ref 1.7–7.7)
Neutrophils Relative %: 57 %
Platelet Count: 142 10*3/uL — ABNORMAL LOW (ref 150–400)
RBC: 4.98 MIL/uL (ref 3.87–5.11)
RDW: 17.7 % — ABNORMAL HIGH (ref 11.5–15.5)
WBC Count: 3.1 10*3/uL — ABNORMAL LOW (ref 4.0–10.5)
nRBC: 0 % (ref 0.0–0.2)

## 2020-11-05 LAB — CEA (ACCESS): CEA (CHCC): 6.84 ng/mL — ABNORMAL HIGH (ref 0.00–5.00)

## 2020-11-05 LAB — CEA (IN HOUSE-CHCC): CEA (CHCC-In House): 6.57 ng/mL — ABNORMAL HIGH (ref 0.00–5.00)

## 2020-11-05 MED ORDER — PALONOSETRON HCL INJECTION 0.25 MG/5ML
INTRAVENOUS | Status: AC
  Filled 2020-11-05: qty 5

## 2020-11-05 MED ORDER — DEXAMETHASONE 4 MG PO TABS: ORAL_TABLET | ORAL | 0 refills | Status: DC

## 2020-11-05 MED ORDER — PALONOSETRON HCL INJECTION 0.25 MG/5ML
0.2500 mg | Freq: Once | INTRAVENOUS | Status: AC
  Administered 2020-11-05: 0.25 mg via INTRAVENOUS

## 2020-11-05 MED ORDER — SODIUM CHLORIDE 0.9 % IV SOLN
10.0000 mg | Freq: Once | INTRAVENOUS | Status: AC
  Administered 2020-11-05: 10 mg via INTRAVENOUS
  Filled 2020-11-05: qty 1

## 2020-11-05 MED ORDER — LEUCOVORIN CALCIUM INJECTION 350 MG
200.0000 mg/m2 | Freq: Once | INTRAVENOUS | Status: AC
  Administered 2020-11-05: 318 mg via INTRAVENOUS
  Filled 2020-11-05: qty 15.9

## 2020-11-05 MED ORDER — OXALIPLATIN CHEMO INJECTION 100 MG/20ML
85.0000 mg/m2 | Freq: Once | INTRAVENOUS | Status: AC
  Administered 2020-11-05: 135 mg via INTRAVENOUS
  Filled 2020-11-05: qty 27

## 2020-11-05 MED ORDER — SODIUM CHLORIDE 0.9 % IV SOLN
2000.0000 mg/m2 | INTRAVENOUS | Status: DC
  Administered 2020-11-05: 3200 mg via INTRAVENOUS
  Filled 2020-11-05: qty 64

## 2020-11-05 MED ORDER — SODIUM CHLORIDE 0.9 % IV SOLN
7.5000 mg/kg | Freq: Once | INTRAVENOUS | Status: AC
  Administered 2020-11-05: 450 mg via INTRAVENOUS
  Filled 2020-11-05: qty 16

## 2020-11-05 MED ORDER — DEXTROSE 5 % IV SOLN
INTRAVENOUS | Status: DC
  Filled 2020-11-05: qty 250

## 2020-11-05 MED ORDER — SODIUM CHLORIDE 0.9 % IV SOLN
Freq: Once | INTRAVENOUS | Status: AC
Start: 2020-11-05 — End: 2020-11-05
  Filled 2020-11-05: qty 250

## 2020-11-05 MED ORDER — SODIUM CHLORIDE 0.9 % IV SOLN
150.0000 mg | Freq: Once | INTRAVENOUS | Status: AC
  Administered 2020-11-05: 150 mg via INTRAVENOUS
  Filled 2020-11-05: qty 5

## 2020-11-05 MED ORDER — POTASSIUM CHLORIDE CRYS ER 20 MEQ PO TBCR: 20.0000 meq | EXTENDED_RELEASE_TABLET | Freq: Two times a day (BID) | ORAL | 1 refills | Status: AC

## 2020-11-05 MED ORDER — DEXTROSE 5 % IV SOLN
Freq: Once | INTRAVENOUS | Status: AC
  Filled 2020-11-05: qty 250

## 2020-11-05 NOTE — Progress Notes (Signed)
[facility] OFFICE PROGRESS NOTE   Diagnosis: Colon cancer  INTERVAL HISTORY:   Tina Morales completed another cycle of FOLFOX/Avastin on 10/15/2020.  She had constipation following chemotherapy.  No change in mild peripheral numbness.  The colostomy is functioning well.  Objective:  Vital signs in last 24 hours:  Blood pressure (!) 140/98, pulse (!) 104, temperature 97.7 F (36.5 C), temperature source Oral, resp. rate 18, height 5' 7"  (1.702 m), weight 136 lb 3.2 oz (61.8 kg), SpO2 98 %.    HEENT: No thrush or ulcers Resp: Lungs clear bilaterally Cardio: Regular rate and rhythm GI: No hepatomegaly, left lower quadrant colostomy Vascular: No leg edema Neuro: Mild loss of vibratory sense at the fingertips bilaterally    Portacath/PICC-without erythema  Lab Results:  Lab Results  Component Value Date   WBC 4.6 10/15/2020   HGB 11.8 (L) 10/15/2020   HCT 37.5 10/15/2020   MCV 67.7 (L) 10/15/2020   PLT 203 10/15/2020   NEUTROABS 2.4 10/15/2020    CMP  Lab Results  Component Value Date   NA 127 (L) 11/05/2020   K 4.4 11/05/2020   CL 96 (L) 11/05/2020   CO2 25 11/05/2020   GLUCOSE 106 (H) 11/05/2020   BUN 5 (L) 11/05/2020   CREATININE 0.32 (L) 11/05/2020   CALCIUM 8.5 (L) 11/05/2020   PROT 5.2 (L) 11/05/2020   ALBUMIN 3.4 (L) 11/05/2020   AST 46 (H) 11/05/2020   ALT 30 11/05/2020   ALKPHOS 284 (H) 11/05/2020   BILITOT 0.7 11/05/2020   GFRNONAA >60 11/05/2020   GFRAA >60 04/10/2020    Lab Results  Component Value Date   CEA1 5.64 (H) 10/15/2020    Medications: I have reviewed the patient's current medications.   Assessment/Plan: 1. Rectal cancer-rectal mass noted on digital exam 02/10/2018, colonoscopy confirmed a him my circumferential mass in the rectum ? Biopsy 02/10/2018-tubular adenoma with at least high-grade dysplasia but no definitive evidence of invasion, pathology review at digestive health specialist-intramucosal adenocarcinoma (at  least), arising in high-grade dysplasia, no loss of mismatch repair protein expression ? CTs 02/10/2018-anterior rectal wall thickening, pulmonary nodules measuring up to 9 mm concerning for metastases, few round perirectal lymph nodes ? Pelvic MRI 02/20/2018-hypermetabolic low rectal mass extending to the posterior vagina with 2 small enlarged perirectal lymph nodes, T4b,N1-2.3 cm from the anal verge ? PET scan 02/23/2018-hypermetabolic rectal mass, 8 mm hypermetabolic lingular nodule, scattered small bilateral lung nodules, some calcified, a few with mildly increased activity ? Cycle 1 FOLFOXIRI8/21/2019 ? Cycle 5 FOLFOXIRI10/16/2019 ? CTs 05/01/2018-significant interval response to therapy with decreased size of the primary rectal mass lesion. Decreasing perirectal lymphadenopathy. Decreased and/or resolved pulmonary nodules. No new sites of disease identified. ? Cycle 6 FOLFOXIRI10/31/2019 ? Radiation/Xeloda 06/12/2018-completed 07/21/2018 ? Xeloda dose reduced 07/10/2018 due to mucositis and diarrhea ? CTs 08/07/2018-compared to 02/10/2018-resolved and decreased pulmonary nodules, few tiny residual noncalcified nodules, decreased 20 perirectal lymph nodes, soft tissue of the rectum indistinguishable from posterior wall of vagina-Vaginal involvement by tumor? ? 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 CT chest 09/29/2018-enlargement of lung nodules, not a candidate for SBRT based on discussion in GI tumor conference and with radiation oncology ? CT chest 11/27/18 - interval growth of numerous pulmonary metastases bilaterally compared to 09/29/18, no new pulmonary mets ? Cycle 1FOLFOXIRI 11/29/18 ? Cycle 2 FOLFOXIRI6/10/2018 ?  Cycle 3 FOLFOXIRI 12/28/2018 ? Cycle  4 FOLFIRINOX 01/11/2019 ? CTs 01/24/2019-significant improvement of bilateral pulmonary metastases. ? Cycle 1 FOLFIRI 02/01/2019 ? Cycle 2 FOLFIRI 02/22/2019 ? Cycle 3 FOLFIRI 03/15/2019 ? Cycle 4 FOLFIRI 04/05/2019 ? Cycle 5 FOLFIRI 04/26/2019 ? CTs 05/14/2019-slight enlargement of right lung nodules, no other evidence of disease progression ? Cycle 6 FOLFIRI 05/17/2019 ? Cycle 7 FOLFIRI plus Avastin 06/13/2019 ? Cycle 8 FOLFIRI plus Avastin 07/09/2019 ? Cycle 9 FOLFIRI plus Avastin 07/25/2019 ? Cycle 10 FOLFIRI plus Avastin 08/09/2019 ? Cycle 11 FOLFIRI plus Avastin 08/22/2019 ? CTs 09/07/2019-most pulmonary nodules are stable. 1 nodule right lower lobe slightly larger. No new lung lesions. ? Cycle 12 FOLFIRI plus Avastin 09/13/2019 ? Cycle 13 FOLFIRI plus Avastin 10/04/2019 ? Cycle 14 FOLFIRI plus Avastin 10/25/2019 ? Cycle 15 FOLFIRI plus Avastin 11/14/2019 ? Cycle 16 FOLFIRI plus Avastin 12/06/2019 ? CTs neck, chest, abdomen, pelvis 12/21/2019-neck negative; mildly progressive pulmonary metastases; abdomen and pelvis negative for evidence of metastatic disease. ? Cycle 1 FOLFOX/Avastin 12/27/2019 ? Cycle 2 FOLFOX/Avastin 01/17/2020 ? Cycle 3 FOLFOX/Avastin 02/07/2020 ? Cycle 4 FOLFOX 02/28/2020, Avastin held due to need for dental evaluation possible extractions ? Cycle 5 FOLFOX 03/20/2020, Avastin held pending dental evaluation ? CTs 04/04/2020-increased size of pulmonary nodules, no evidence of metastatic disease in the abdomen or pelvis ? Cycle 6 FOLFOX 05/22/2020, Avastin held secondary to dental extractions ? Cycle 7 FOLFOX/Avastin 06/12/2020 ? Cycle 8 FOLFOX/Avastin 06/30/2020 ? Cycle 9 FOLFOX/Avastin 07/24/2020 ? Cycle 10 FOLFOX/Avastin 08/14/2020 ? CTs 09/01/2020-improvement in bilateral pulmonary metastases. ? Cycle 11 FOLFOX/Avastin 09/04/2020 ? Cycle 12 FOLFOX/Avastin 09/25/2020 ? Cycle 13 FOLFOX/Avastin 10/15/2020 ? 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 Imodium as needed. 6.Mucositis secondary to Xeloda. Improved 07/19/2018. 7. History of migraines 8.  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 Corredor appears stable.  She continues to tolerate the FOLFOX well.  She has stable mild neuropathy symptoms.  There is no clinical evidence of disease progression.  She will complete another cycle of FOLFOX today.  She has an elevated blood pressure.  The plan is to begin antihypertensive therapy if the blood pressure remains this high at the next office visit.  She will return for an office visit and chemotherapy in 3 weeks.  Betsy Coder, MD  11/05/2020  10:37 AM

## 2020-11-05 NOTE — Progress Notes (Signed)
Port accessed for chemo visit. Patient tolerated well.

## 2020-11-05 NOTE — Progress Notes (Signed)
Tina Morales presents today for D1C36 Avastin and FOLFOX. Pt denies any new changes or symptoms since last treatment. Lab results and vitals have been reviewed and are stable and within parameters for treatment. Patient has been assessed by Dr. Benay Spice who has approved proceeding with treatment today as planned.   Infusions tolerated without incident or complaint. VSS upon completion of treatment. 5FU infusing via home infusion pump through port without difficulties, see MAR and IV flowsheet for details. Discharged in satisfactory condition with follow up instructions.

## 2020-11-05 NOTE — Patient Instructions (Signed)
[facility]  Discharge Instructions: Thank you for choosing [facility] to provide your oncology and hematology care.   If you have a lab appointment with the [facility], please go directly to the [facility] and check in at the registration area.   Wear comfortable clothing and clothing appropriate for easy access to any Portacath or PICC line.   We strive to give you quality time with your provider. You may need to reschedule your appointment if you arrive late (15 or more minutes).  Arriving late affects you and other patients whose appointments are after yours.  Also, if you miss three or more appointments without notifying the office, you may be dismissed from the clinic at the provider's discretion.      For prescription refill requests, have your pharmacy contact our office and allow 72 hours for refills to be completed.    Today you received the following chemotherapy and/or immunotherapy agents Avastin and FOLFOX     To help prevent nausea and vomiting after your treatment, we encourage you to take your nausea medication as directed.  BELOW ARE SYMPTOMS THAT SHOULD BE REPORTED IMMEDIATELY: . *FEVER GREATER THAN 100.4 F (38 C) OR HIGHER . *CHILLS OR SWEATING . *NAUSEA AND VOMITING THAT IS NOT CONTROLLED WITH YOUR NAUSEA MEDICATION . *UNUSUAL SHORTNESS OF BREATH . *UNUSUAL BRUISING OR BLEEDING . *URINARY PROBLEMS (pain or burning when urinating, or frequent urination) . *BOWEL PROBLEMS (unusual diarrhea, constipation, pain near the anus) . TENDERNESS IN MOUTH AND THROAT WITH OR WITHOUT PRESENCE OF ULCERS (sore throat, sores in mouth, or a toothache) . UNUSUAL RASH, SWELLING OR PAIN  . UNUSUAL VAGINAL DISCHARGE OR ITCHING   Items with * indicate a potential emergency and should be followed up as soon as possible or go to the Emergency Department if any problems should occur.  Please show the CHEMOTHERAPY ALERT CARD or IMMUNOTHERAPY  ALERT CARD at check-in to the Emergency Department and triage nurse.  Should you have questions after your visit or need to cancel or reschedule your appointment, please contact [facility]  Dept: [phone]  and follow the prompts.  Office hours are 8:00 a.m. to 4:30 p.m. Monday - Friday. Please note that voicemails left after 4:00 p.m. may not be returned until the following business day.  We are closed weekends and major holidays. You have access to a nurse at all times for urgent questions. Please call the main number to the clinic Dept: [phone] and follow the prompts.   For any non-urgent questions, you may also contact your provider using MyChart. We now offer e-Visits for anyone 73 and older to request care online for non-urgent symptoms. For details visit [email or web address].   Also download the MyChart app! Go to the app store, search "MyChart", open the app, select [facility], and log in with your MyChart username and password.  Due to Covid, a mask is required upon entering the hospital/clinic. If you do not have a mask, one will be given to you upon arrival. For doctor visits, patients may have 1 support person aged 23 or older with them. For treatment visits, patients cannot have anyone with them due to current Covid guidelines and our immunocompromised population.

## 2020-11-07 ENCOUNTER — Encounter: Payer: Self-pay | Admitting: *Deleted

## 2020-11-07 ENCOUNTER — Inpatient Hospital Stay: Payer: BC Managed Care – PPO

## 2020-11-07 ENCOUNTER — Other Ambulatory Visit: Payer: Self-pay

## 2020-11-07 VITALS — BP 159/105 | HR 103 | Temp 98.2°F | Resp 20

## 2020-11-07 DIAGNOSIS — C20 Malignant neoplasm of rectum: Secondary | ICD-10-CM

## 2020-11-07 MED ORDER — SODIUM CHLORIDE 0.9% FLUSH
10.0000 mL | INTRAVENOUS | Status: DC | PRN
  Administered 2020-11-07: 10 mL
  Filled 2020-11-07: qty 10

## 2020-11-07 MED ORDER — HEPARIN SOD (PORK) LOCK FLUSH 100 UNIT/ML IV SOLN
500.0000 [IU] | Freq: Once | INTRAVENOUS | Status: AC | PRN
Start: 2020-11-07 — End: 2020-11-07
  Administered 2020-11-07: 500 [IU]
  Filled 2020-11-07: qty 5

## 2020-11-07 NOTE — Progress Notes (Signed)
Scheduled CT for 12/09/20 at [facility] for 0945/1000. NPO after 6 am and oral contrast at 0800 and 0900. Instructions written down and given to infusion nurse to give her when she comes for pump d/c today.

## 2020-11-07 NOTE — Progress Notes (Signed)
Patient presents for pump d/c today. Patient tolerated chemo without any problems.

## 2020-11-18 ENCOUNTER — Other Ambulatory Visit: Payer: Self-pay

## 2020-11-18 ENCOUNTER — Emergency Department (HOSPITAL_BASED_OUTPATIENT_CLINIC_OR_DEPARTMENT_OTHER): Payer: BC Managed Care – PPO

## 2020-11-18 ENCOUNTER — Inpatient Hospital Stay (HOSPITAL_BASED_OUTPATIENT_CLINIC_OR_DEPARTMENT_OTHER)
Admission: EM | Admit: 2020-11-18 | Discharge: 2020-11-20 | DRG: 394 | Disposition: A | Discharge status: Home / Self Care | Payer: BC Managed Care – PPO | Attending: Family Medicine | Admitting: Family Medicine

## 2020-11-18 ENCOUNTER — Encounter (HOSPITAL_BASED_OUTPATIENT_CLINIC_OR_DEPARTMENT_OTHER): Payer: Self-pay | Admitting: Emergency Medicine

## 2020-11-18 DIAGNOSIS — Z79899 Other long term (current) drug therapy: Secondary | ICD-10-CM

## 2020-11-18 DIAGNOSIS — Z95828 Presence of other vascular implants and grafts: Secondary | ICD-10-CM

## 2020-11-18 DIAGNOSIS — I10 Essential (primary) hypertension: Secondary | ICD-10-CM | POA: Diagnosis present

## 2020-11-18 DIAGNOSIS — R030 Elevated blood-pressure reading, without diagnosis of hypertension: Secondary | ICD-10-CM

## 2020-11-18 DIAGNOSIS — Z87891 Personal history of nicotine dependence: Secondary | ICD-10-CM

## 2020-11-18 DIAGNOSIS — D72829 Elevated white blood cell count, unspecified: Secondary | ICD-10-CM | POA: Diagnosis present

## 2020-11-18 DIAGNOSIS — E860 Dehydration: Secondary | ICD-10-CM | POA: Diagnosis present

## 2020-11-18 DIAGNOSIS — Z20822 Contact with and (suspected) exposure to covid-19: Secondary | ICD-10-CM | POA: Diagnosis present

## 2020-11-18 DIAGNOSIS — K529 Noninfective gastroenteritis and colitis, unspecified: Secondary | ICD-10-CM | POA: Diagnosis present

## 2020-11-18 DIAGNOSIS — C19 Malignant neoplasm of rectosigmoid junction: Secondary | ICD-10-CM

## 2020-11-18 DIAGNOSIS — F419 Anxiety disorder, unspecified: Secondary | ICD-10-CM | POA: Diagnosis present

## 2020-11-18 DIAGNOSIS — K521 Toxic gastroenteritis and colitis: Secondary | ICD-10-CM | POA: Diagnosis present

## 2020-11-18 DIAGNOSIS — Z933 Colostomy status: Secondary | ICD-10-CM | POA: Diagnosis not present

## 2020-11-18 DIAGNOSIS — Z882 Allergy status to sulfonamides status: Secondary | ICD-10-CM

## 2020-11-18 DIAGNOSIS — E876 Hypokalemia: Secondary | ICD-10-CM | POA: Diagnosis present

## 2020-11-18 LAB — CBC WITH DIFFERENTIAL/PLATELET
Abs Immature Granulocytes: 0.1 10*3/uL — ABNORMAL HIGH (ref 0.00–0.07)
Basophils Absolute: 0 10*3/uL (ref 0.0–0.1)
Basophils Relative: 0 %
Eosinophils Absolute: 0 10*3/uL (ref 0.0–0.5)
Eosinophils Relative: 0 %
HCT: 39.1 % (ref 36.0–46.0)
Hemoglobin: 12.2 g/dL (ref 12.0–15.0)
Immature Granulocytes: 1 %
Lymphocytes Relative: 3 %
Lymphs Abs: 0.5 10*3/uL — ABNORMAL LOW (ref 0.7–4.0)
MCH: 21 pg — ABNORMAL LOW (ref 26.0–34.0)
MCHC: 31.2 g/dL (ref 30.0–36.0)
MCV: 67.4 fL — ABNORMAL LOW (ref 80.0–100.0)
Monocytes Absolute: 1 10*3/uL (ref 0.1–1.0)
Monocytes Relative: 7 %
Neutro Abs: 13.6 10*3/uL — ABNORMAL HIGH (ref 1.7–7.7)
Neutrophils Relative %: 89 %
Platelet Morphology: NORMAL
Platelets: 165 10*3/uL (ref 150–400)
RBC: 5.8 MIL/uL — ABNORMAL HIGH (ref 3.87–5.11)
RDW: 19.2 % — ABNORMAL HIGH (ref 11.5–15.5)
WBC: 15.2 10*3/uL — ABNORMAL HIGH (ref 4.0–10.5)
nRBC: 0 % (ref 0.0–0.2)

## 2020-11-18 LAB — URINALYSIS, ROUTINE W REFLEX MICROSCOPIC
Bilirubin Urine: NEGATIVE
Glucose, UA: NEGATIVE mg/dL
Hgb urine dipstick: NEGATIVE
Ketones, ur: NEGATIVE mg/dL
Nitrite: NEGATIVE
Protein, ur: NEGATIVE mg/dL
Specific Gravity, Urine: 1.022 (ref 1.005–1.030)
pH: 6 (ref 5.0–8.0)

## 2020-11-18 LAB — COMPREHENSIVE METABOLIC PANEL
ALT: 14 U/L (ref 0–44)
AST: 23 U/L (ref 15–41)
Albumin: 3.7 g/dL (ref 3.5–5.0)
Alkaline Phosphatase: 244 U/L — ABNORMAL HIGH (ref 38–126)
Anion gap: 13 (ref 5–15)
BUN: 10 mg/dL (ref 6–20)
CO2: 26 mmol/L (ref 22–32)
Calcium: 9 mg/dL (ref 8.9–10.3)
Chloride: 95 mmol/L — ABNORMAL LOW (ref 98–111)
Creatinine, Ser: 0.43 mg/dL — ABNORMAL LOW (ref 0.44–1.00)
GFR, Estimated: >60 mL/min (ref >60)
Glucose, Bld: 117 mg/dL — ABNORMAL HIGH (ref 70–99)
Potassium: 3.3 mmol/L — ABNORMAL LOW (ref 3.5–5.1)
Sodium: 134 mmol/L — ABNORMAL LOW (ref 135–145)
Total Bilirubin: 1 mg/dL (ref 0.3–1.2)
Total Protein: 6.2 g/dL — ABNORMAL LOW (ref 6.5–8.1)

## 2020-11-18 LAB — RESP PANEL BY RT-PCR (FLU A&B, COVID) ARPGX2
Influenza A by PCR: NEGATIVE
Influenza B by PCR: NEGATIVE
SARS Coronavirus 2 by RT PCR: NEGATIVE

## 2020-11-18 LAB — LACTIC ACID, PLASMA: Lactic Acid, Venous: 1.6 mmol/L (ref 0.5–1.9)

## 2020-11-18 LAB — LIPASE, BLOOD: Lipase: 34 U/L (ref 11–51)

## 2020-11-18 MED ORDER — ALPRAZOLAM 0.5 MG PO TABS
0.5000 mg | ORAL_TABLET | Freq: Every day | ORAL | Status: DC
  Administered 2020-11-18 – 2020-11-19 (×2): 0.5 mg via ORAL
  Filled 2020-11-18 (×2): qty 1

## 2020-11-18 MED ORDER — SODIUM CHLORIDE 0.9 % IV SOLN: INTRAVENOUS | Status: DC

## 2020-11-18 MED ORDER — METRONIDAZOLE 500 MG/100ML IV SOLN
500.0000 mg | Freq: Once | INTRAVENOUS | Status: AC
  Administered 2020-11-18: 500 mg via INTRAVENOUS
  Filled 2020-11-18: qty 100

## 2020-11-18 MED ORDER — SODIUM CHLORIDE 0.9 % IV BOLUS
1000.0000 mL | Freq: Once | INTRAVENOUS | Status: AC
  Administered 2020-11-18: 1000 mL via INTRAVENOUS

## 2020-11-18 MED ORDER — SODIUM CHLORIDE 0.9 % IV SOLN
INTRAVENOUS | Status: DC | PRN
  Administered 2020-11-18: 250 mL via INTRAVENOUS

## 2020-11-18 MED ORDER — METRONIDAZOLE 500 MG/100ML IV SOLN: 500.0000 mg | Freq: Three times a day (TID) | INTRAVENOUS | Status: DC

## 2020-11-18 MED ORDER — ACETAMINOPHEN 650 MG RE SUPP: 650.0000 mg | Freq: Four times a day (QID) | RECTAL | Status: DC | PRN

## 2020-11-18 MED ORDER — CIPROFLOXACIN IN D5W 400 MG/200ML IV SOLN
400.0000 mg | Freq: Two times a day (BID) | INTRAVENOUS | Status: DC
  Administered 2020-11-18: 400 mg via INTRAVENOUS
  Filled 2020-11-18: qty 200

## 2020-11-18 MED ORDER — ONDANSETRON HCL 4 MG/2ML IJ SOLN: 4.0000 mg | Freq: Four times a day (QID) | INTRAMUSCULAR | Status: DC | PRN

## 2020-11-18 MED ORDER — PIPERACILLIN-TAZOBACTAM 3.375 G IVPB
3.3750 g | Freq: Three times a day (TID) | INTRAVENOUS | Status: DC
  Administered 2020-11-18 – 2020-11-20 (×5): 3.375 g via INTRAVENOUS
  Filled 2020-11-18 (×7): qty 50

## 2020-11-18 MED ORDER — PIPERACILLIN-TAZOBACTAM 3.375 G IVPB 30 MIN: 3.3750 g | Freq: Once | INTRAVENOUS | Status: DC

## 2020-11-18 MED ORDER — POTASSIUM CHLORIDE CRYS ER 20 MEQ PO TBCR
40.0000 meq | EXTENDED_RELEASE_TABLET | Freq: Once | ORAL | Status: AC
  Administered 2020-11-18: 40 meq via ORAL
  Filled 2020-11-18: qty 2

## 2020-11-18 MED ORDER — HYDROMORPHONE HCL 1 MG/ML IJ SOLN
1.0000 mg | Freq: Once | INTRAMUSCULAR | Status: AC
  Administered 2020-11-18: 1 mg via INTRAVENOUS
  Filled 2020-11-18: qty 1

## 2020-11-18 MED ORDER — HYDROMORPHONE HCL 1 MG/ML IJ SOLN
1.0000 mg | INTRAMUSCULAR | Status: DC | PRN
  Administered 2020-11-19 – 2020-11-20 (×2): 1 mg via INTRAVENOUS
  Filled 2020-11-18 (×2): qty 1

## 2020-11-18 MED ORDER — HYDROMORPHONE HCL 1 MG/ML IJ SOLN
1.0000 mg | Freq: Once | INTRAMUSCULAR | Status: AC
  Administered 2020-11-18: 0.5 mg via INTRAVENOUS
  Filled 2020-11-18: qty 1

## 2020-11-18 MED ORDER — ENOXAPARIN SODIUM 40 MG/0.4ML IJ SOSY
40.0000 mg | PREFILLED_SYRINGE | Freq: Every day | INTRAMUSCULAR | Status: DC
  Administered 2020-11-18 – 2020-11-19 (×2): 40 mg via SUBCUTANEOUS
  Filled 2020-11-18 (×2): qty 0.4

## 2020-11-18 MED ORDER — ONDANSETRON HCL 4 MG/2ML IJ SOLN
4.0000 mg | Freq: Once | INTRAMUSCULAR | Status: AC
  Administered 2020-11-18: 4 mg via INTRAVENOUS
  Filled 2020-11-18: qty 2

## 2020-11-18 MED ORDER — ONDANSETRON HCL 4 MG PO TABS: 4.0000 mg | ORAL_TABLET | Freq: Four times a day (QID) | ORAL | Status: DC | PRN

## 2020-11-18 MED ORDER — ACETAMINOPHEN 325 MG PO TABS
650.0000 mg | ORAL_TABLET | Freq: Four times a day (QID) | ORAL | Status: DC | PRN
  Administered 2020-11-19 (×2): 650 mg via ORAL
  Filled 2020-11-18 (×2): qty 2

## 2020-11-18 MED ORDER — IOHEXOL 300 MG/ML  SOLN
100.0000 mL | Freq: Once | INTRAMUSCULAR | Status: AC | PRN
  Administered 2020-11-18: 75 mL via INTRAVENOUS

## 2020-11-18 NOTE — ED Notes (Addendum)
Only 0.5 mL dialuded given per pt request. [place] RN witnessed waste. Changed same in [facility]

## 2020-11-18 NOTE — ED Provider Notes (Signed)
Initially seen by Dr. Melina Copa.  Please see his note.  Patient's heart rate has decreased with IV fluid hydration.  Patient presented with nausea vomiting crampy abdominal pain and fever to 101.9.  Patient's white blood cell count is elevated.  No evidence of neutropenia.  CT scan does show evidence of colitis.  I will start the patient on IV antibiotics.  With her immunocompromise state I will consult the medical service for admission.   Dorie Rank, MD 11/18/20 1714

## 2020-11-18 NOTE — ED Notes (Signed)
Patient transported to CT 

## 2020-11-18 NOTE — ED Triage Notes (Signed)
Pt reports abdominal x 1 week, [facility] cancer , on chemo . Vomiting, colostomy bag .

## 2020-11-18 NOTE — ED Notes (Signed)
Back from Ct.

## 2020-11-18 NOTE — H&P (Signed)
History and Physical    Tina Morales [id number] DOB: [birth year] DOA: 11/18/2020  PCP: [facility], [facility]   Patient coming from: Home via [facility]  Chief Complaint: abdominal pain, nausea and vomiting  HPI: Tina Morales is a 55 y.o. female with medical history significant for stage IV colorectal cancer and is on active chemotherapy with Dr. Benay Spice.  Last treatment was about 2 weeks ago.  1 week history of cramping abdominal pain associated with nausea vomiting and decreased colostomy output the last few days.  She reports a fever to 101.9 degrees at home.  She has not had any blood or coffee-ground emesis.  She has not had any blood or mucus in the colostomy output that she can tell.  Have a history of bowel obstruction in the past and states the abdominal pain feels similar.  He has not had any chest pain, palpitations, cough, shortness of breath, urinary frequency or dysuria.  She has had no recent travel.  She denies any injury or trauma to her abdomen.  Denies any recent change in diet.  ED Course: She is found to have colitis on CT scan.  She has elevated WBC of 15,200.  Hemoglobin is 12.2 hematocrit 39.1 and platelets of 165.  Sodium is 134, potassium 3.3, creatinine 0.43, BUN 10, GFR greater than 60, lipase 34.  LFTs are normal.  Lactic acid is 1.6.  Urinalysis is negative.  Patient was started on antibiotic therapy and was given Zosyn and Flagyl in the emergency room.  Oncology evaluated patient in the emergency room.  Hospitalist services been asked to admit for further management  Review of Systems:  General: Denies fever, chills, weight loss, night sweats.  Denies dizziness.  HENT: Denies head trauma, headache, denies change in hearing, tinnitus.  Denies nasal bleeding.  Denies sore throat, sores in mouth.  Denies difficulty swallowing Eyes: Denies blurry vision, pain in eye, drainage.  Denies discoloration of eyes. Neck: Denies pain.  Denies swelling.  Denies  pain with movement. Cardiovascular: Denies chest pain, palpitations.  Denies edema.  Denies orthopnea Respiratory: Denies shortness of breath, cough.  Denies wheezing.  Denies sputum production Gastrointestinal: Reports abdominal pain, swelling. Decreased output from colostomy. Reports nausea, vomiting.  Denies melena.  Denies hematemesis. Musculoskeletal: Denies limitation of movement.  Denies deformity or swelling. Denies arthralgias or myalgias. Genitourinary: Denies pelvic pain.  Denies urinary frequency or hesitancy.  Denies dysuria.  Skin: Denies rash.  Denies petechiae, purpura, ecchymosis. Neurological: Denies headache.  Denies syncope.  Denies seizure activity.  Denies weakness or paresthesia.  Denies slurred speech, drooping face.  Denies visual change. Psychiatric: Denies depression, anxiety.  Denies hallucinations.  Past Medical History:  Diagnosis Date  . Adenocarcinoma ([place]) 02/2018  . Anxiety   . Cancer ([place]) 02/10/2018   Stage 4 Colorectal Cancer, spot on her lungs  . Migraine     Past Surgical History:  Procedure Laterality Date  . ADENOIDECTOMY    . CYSTOSCOPY W/ RETROGRADES Bilateral 09/01/2018   Procedure: CYSTOSCOPY WITH BILATERAL  RETROGRADE PYELOGRAM;  Surgeon: Alexis Frock, MD;  Location: [facility];  Service: Urology;  Laterality: Bilateral;  . CYSTOSCOPY WITH STENT PLACEMENT Bilateral 09/01/2018   Procedure: CYSTOSCOPY WITH URETERAL COOK CATHETER PLACEMENT;  Surgeon: Alexis Frock, MD;  Location: [facility];  Service: Urology;  Laterality: Bilateral;  . IR CV LINE INJECTION  06/09/2020  . ROBOTIC ASSISTED TOTAL HYSTERECTOMY WITH BILATERAL SALPINGO OOPHERECTOMY Bilateral 09/01/2018   Procedure: XI ROBOTIC ASSISTED PARTIAL VAGINECTOMY;  Surgeon: Denman George,  Terrence Dupont, MD;  Location: [facility];  Service: Gynecology;  Laterality: Bilateral;  . TONSILLECTOMY      Social History  reports that she quit smoking about 2 years ago. She quit after 30.00 years of use. She has never used  smokeless tobacco. She reports previous alcohol use. She reports that she does not use drugs.  Allergies  Allergen Reactions  . Sulfa Antibiotics     Patient unaware of side effects, she was told when she was a child that she was allergic    Family History  Problem Relation Age of Onset  . Breast cancer Maternal Aunt   . Throat cancer Cousin   . Cancer Cousin   . Throat cancer Cousin   . Lung cancer Paternal Uncle      Prior to Admission medications   Medication Sig Start Date End Date Taking? Authorizing Provider  acetaminophen (TYLENOL) 500 MG tablet Take 1,000 mg by mouth every 8 (eight) hours as needed (PAIN).    Yes [provider]  ALPRAZolam (XANAX) 0.5 MG tablet Take 1 tablet (0.5 mg total) by mouth 2 (two) times daily as needed for anxiety. Patient taking differently: Take 0.5 mg by mouth at bedtime. 08/14/20  Yes Ladell Pier, MD  Aspirin-Salicylamide-Caffeine (BC HEADACHE PO) Take 1 Package by mouth daily as needed (pain).   Yes [provider]  bisacodyl (DULCOLAX) 5 MG EC tablet Take 15 mg by mouth daily as needed for moderate constipation. Takes for few days after each tx    Yes [provider]  dexamethasone (DECADRON) 4 MG tablet #2 tablets twice daily x 3 days. Start day after chemo treatment 11/05/20  Yes Ladell Pier, MD  diphenoxylate-atropine (LOMOTIL) 2.5-0.025 MG tablet Take 2 tablets by mouth 4 (four) times daily as needed for diarrhea or loose stools. 06/12/20  Yes Ladell Pier, MD  lidocaine-prilocaine (EMLA) cream Apply 1 application topically as needed (port). 06/30/20  Yes Ladell Pier, MD  loratadine (CLARITIN) 10 MG tablet Take 10 mg by mouth daily as needed for allergies.   Yes [provider]  Magnesium Oxide 250 MG TABS Take 500 mg by mouth 2 (two) times daily.   Yes [provider]  omeprazole (PRILOSEC) 20 MG capsule Take 20 mg by mouth daily as needed (indigestion).   Yes [provider]  Polyethylene Glycol 3350 (MIRALAX PO) Take 17 g by mouth daily as needed (constipation).   Yes [provider]  potassium chloride SA (KLOR-CON) 20 MEQ tablet Take 1 tablet (20 mEq total) by mouth 2 (two) times daily. 11/05/20  Yes Ladell Pier, MD  prochlorperazine (COMPAZINE) 10 MG tablet Take 1 tablet (10 mg total) by mouth every 6 (six) hours as needed for nausea or vomiting. 06/12/20  Yes Ladell Pier, MD  cyclobenzaprine (FLEXERIL) 5 MG tablet Take 1 tablet (5 mg total) by mouth 2 (two) times daily as needed for muscle spasms. Patient not taking: No sig reported 03/20/20   Owens Shark, NP  magic mouthwash SOLN Take 5 mLs by mouth 4 (four) times daily as needed for mouth pain. Patient not taking: No sig reported 07/26/19   Ladell Pier, MD  traMADol (ULTRAM) 50 MG tablet Take 1 tablet (50 mg total) by mouth every 6 (six) hours as needed. Patient not taking: No sig reported 10/18/18   Ladell Pier, MD    Physical Exam: Vitals:   11/18/20 1756 11/18/20 1845 11/18/20 1900 11/18/20 2045  BP:  Marland Kitchen)  140/103 (!) 152/102 (!) 155/100  Pulse:  (!) 116 (!) 116 (!) 109  Resp:  (!) 21 20 20   Temp: 98.9 F (37.2 C)   98.4 F (36.9 C)  TempSrc: Oral   Oral  SpO2:  97% 96% 99%  Weight:    59.1 kg    Constitutional: NAD, calm, comfortable Vitals:   11/18/20 1756 11/18/20 1845 11/18/20 1900 11/18/20 2045  BP:  (!) 140/103 (!) 152/102 (!) 155/100  Pulse:  (!) 116 (!) 116 (!) 109  Resp:  (!) 21 20 20   Temp: 98.9 F (37.2 C)   98.4 F (36.9 C)  TempSrc: Oral   Oral  SpO2:  97% 96% 99%  Weight:    59.1 kg   General: WDWN, Alert and oriented x3.  Eyes: EOMI, PERRL, conjunctivae normal.  Sclera nonicteric HENT:  [place]/AT, external ears normal.  Nares patent without epistasis.  Mucous membranes are moist. .  Neck: Soft, normal range of motion, supple, no masses, no thyromegaly. Trachea midline Respiratory: clear to auscultation bilaterally, no wheezing, no crackles. Normal  respiratory effort. No accessory muscle use.  Cardiovascular: Regular rhythm, tachycardia, no murmurs / rubs / gallops. No extremity edema. 2+ pedal pulses.  Abdomen: Soft, diffuse tenderness, nondistended, colostomy in LLQ. no rebound or guarding.  No masses palpated. Bowel sounds normoactive Musculoskeletal: FROM. no cyanosis. No joint deformity upper and lower extremities. Normal muscle tone.  Skin: Warm, dry, intact no rashes, lesions, ulcers. No induration Neurologic: CN 2-12 grossly intact.  Normal speech.  Sensation intact. Strength 5/5 in all extremities.   Psychiatric: Normal judgment and insight.  Normal mood.    Labs on Admission: I have personally reviewed following labs and imaging studies  CBC: Recent Labs  Lab 11/18/20 1346  WBC 15.2*  NEUTROABS 13.6*  HGB 12.2  HCT 39.1  MCV 67.4*  PLT 270    Basic Metabolic Panel: Recent Labs  Lab 11/18/20 1346  NA 134*  K 3.3*  CL 95*  CO2 26  GLUCOSE 117*  BUN 10  CREATININE 0.43*  CALCIUM 9.0    GFR: Estimated Creatinine Clearance: 76.7 mL/min (A) (by C-G formula based on SCr of 0.43 mg/dL (L)).  Liver Function Tests: Recent Labs  Lab 11/18/20 1346  AST 23  ALT 14  ALKPHOS 244*  BILITOT 1.0  PROT 6.2*  ALBUMIN 3.7    Urine analysis:    Component Value Date/Time   COLORURINE YELLOW 11/18/2020 1346   APPEARANCEUR CLEAR 11/18/2020 1346   LABSPEC 1.022 11/18/2020 1346   PHURINE 6.0 11/18/2020 1346   GLUCOSEU NEGATIVE 11/18/2020 1346   HGBUR NEGATIVE 11/18/2020 [place] 11/18/2020 1346   [place] 11/18/2020 1346   PROTEINUR NEGATIVE 11/18/2020 1346   NITRITE NEGATIVE 11/18/2020 1346   LEUKOCYTESUR TRACE (A) 11/18/2020 1346    Radiological Exams on Admission: CT CHEST W CONTRAST  Result Date: 11/18/2020 CLINICAL DATA:  Abdominal pain for 1 week. History of colon cancer. On chemotherapy. Prior colostomy. EXAM: CT CHEST, ABDOMEN, AND PELVIS WITH CONTRAST TECHNIQUE:  Multidetector CT imaging of the chest, abdomen and pelvis was performed following the standard protocol during bolus administration of intravenous contrast. CONTRAST:  53mL OMNIPAQUE IOHEXOL 300 MG/ML  SOLN COMPARISON:  CT 09/01/2020 FINDINGS: CT CHEST FINDINGS Cardiovascular: Port in the anterior chest wall with tip in distal SVC. Coronary artery calcification and aortic atherosclerotic calcification. Mediastinum/Nodes: No axillary or supraclavicular adenopathy. No mediastinal or hilar adenopathy. No pericardial fluid. Esophagus normal. Lungs/Pleura: Bilateral pulmonary nodules  again demonstrated. Nodules appear very similar to CT 09/01/2020. Example nodule in the superior segment of the RIGHT lower lobe measures 9 mm (image 55) compared to 10 mm on prior. Nodule in the RIGHT upper lobe measures 9 mm (image 57) compared with 9 mm. Nodule in the lateral aspect of the LEFT lower lobe measures 6 mm (image 85) compared with 5 mm. Peripheral LEFT upper lobe nodule measures 5 mm (image 67) compared with 6 mm. No new nodules are identified. Musculoskeletal: No aggressive osseous lesion. CT ABDOMEN AND PELVIS FINDINGS Hepatobiliary: No focal hepatic lesion. Small amount pericholecystic fluid is new from prior (image 16/series 2). No intrahepatic duct dilatation. Common bile duct normal caliber. Pancreas: Pancreas is normal. No ductal dilatation. No pancreatic inflammation. Spleen: Spleen normal Adrenals/urinary tract: Adrenal glands and kidneys are normal. The ureters and bladder normal. Stomach/Bowel: Stomach and duodenum normal. There is mild submucosal edema within the loops of the small bowel deep within the pelvis (image 107/series 602). The small amount of fluid within the pelvis which is similar prior. There is fluid stool throughout the colon. The distal colon leading up to the ostomy demonstrates sub mucosal edema and mucosal enhancement (image 90/62 for example). There is mucosal enhancement through the colon  exiting the ostomy. No evidence of local recurrence in the proctectomy site. There is no evidence of bowel obstruction. Vascular/Lymphatic: Abdominal aorta is normal caliber with atherosclerotic calcification. There is no retroperitoneal or periportal lymphadenopathy. No pelvic lymphadenopathy. Reproductive: Post hysterectomy.  Adnexa unremarkable Other: Along the posterior margin of the RIGHT hepatic lobe there is peritoneal thickening with potential enhancement. Small amount fluid at this site on comparison exam however the enhancement is new (image 63/62). Nodular enhancement measures 10 mm x 7 mm image 64/602). Nodularity also seen on coronal image 24/605) Musculoskeletal: No aggressive osseous lesion. IMPRESSION: Chest Impression: 1. Stable bilateral pulmonary nodular metastasis. 2. No mediastinal lymphadenopathy. Abdomen / Pelvis Impression: 1. Submucosal edema and mucosal enhancement involving the distal colon leading up to the colostomy as well as loops of small bowel in the pelvis. Findings are most consistent with colitis and enteritis. No high-grade obstruction. a 2. Concern for subtle peritoneal nodular enhancement along the posterior aspect the RIGHT hepatic lobe. Recommend close attention on follow-up with CT scan or FDG PET scan. These results will be called to the ordering clinician or representative by the Radiologist Assistant, and communication documented in the PACS or [employer]. Electronically Signed   By: Genevive Bi M.D.   On: 11/18/2020 16:42   CT Abdomen Pelvis W Contrast  Result Date: 11/18/2020 CLINICAL DATA:  Abdominal pain for 1 week. History of colon cancer. On chemotherapy. Prior colostomy. EXAM: CT CHEST, ABDOMEN, AND PELVIS WITH CONTRAST TECHNIQUE: Multidetector CT imaging of the chest, abdomen and pelvis was performed following the standard protocol during bolus administration of intravenous contrast. CONTRAST:  9mL OMNIPAQUE IOHEXOL 300 MG/ML  SOLN COMPARISON:  CT  09/01/2020 FINDINGS: CT CHEST FINDINGS Cardiovascular: Port in the anterior chest wall with tip in distal SVC. Coronary artery calcification and aortic atherosclerotic calcification. Mediastinum/Nodes: No axillary or supraclavicular adenopathy. No mediastinal or hilar adenopathy. No pericardial fluid. Esophagus normal. Lungs/Pleura: Bilateral pulmonary nodules again demonstrated. Nodules appear very similar to CT 09/01/2020. Example nodule in the superior segment of the RIGHT lower lobe measures 9 mm (image 55) compared to 10 mm on prior. Nodule in the RIGHT upper lobe measures 9 mm (image 57) compared with 9 mm. Nodule in the lateral aspect of the  LEFT lower lobe measures 6 mm (image 85) compared with 5 mm. Peripheral LEFT upper lobe nodule measures 5 mm (image 67) compared with 6 mm. No new nodules are identified. Musculoskeletal: No aggressive osseous lesion. CT ABDOMEN AND PELVIS FINDINGS Hepatobiliary: No focal hepatic lesion. Small amount pericholecystic fluid is new from prior (image 16/series 2). No intrahepatic duct dilatation. Common bile duct normal caliber. Pancreas: Pancreas is normal. No ductal dilatation. No pancreatic inflammation. Spleen: Spleen normal Adrenals/urinary tract: Adrenal glands and kidneys are normal. The ureters and bladder normal. Stomach/Bowel: Stomach and duodenum normal. There is mild submucosal edema within the loops of the small bowel deep within the pelvis (image 107/series 602). The small amount of fluid within the pelvis which is similar prior. There is fluid stool throughout the colon. The distal colon leading up to the ostomy demonstrates sub mucosal edema and mucosal enhancement (image 90/62 for example). There is mucosal enhancement through the colon exiting the ostomy. No evidence of local recurrence in the proctectomy site. There is no evidence of bowel obstruction. Vascular/Lymphatic: Abdominal aorta is normal caliber with atherosclerotic calcification. There is no  retroperitoneal or periportal lymphadenopathy. No pelvic lymphadenopathy. Reproductive: Post hysterectomy.  Adnexa unremarkable Other: Along the posterior margin of the RIGHT hepatic lobe there is peritoneal thickening with potential enhancement. Small amount fluid at this site on comparison exam however the enhancement is new (image 63/62). Nodular enhancement measures 10 mm x 7 mm image 64/602). Nodularity also seen on coronal image 24/605) Musculoskeletal: No aggressive osseous lesion. IMPRESSION: Chest Impression: 1. Stable bilateral pulmonary nodular metastasis. 2. No mediastinal lymphadenopathy. Abdomen / Pelvis Impression: 1. Submucosal edema and mucosal enhancement involving the distal colon leading up to the colostomy as well as loops of small bowel in the pelvis. Findings are most consistent with colitis and enteritis. No high-grade obstruction. a 2. Concern for subtle peritoneal nodular enhancement along the posterior aspect the RIGHT hepatic lobe. Recommend close attention on follow-up with CT scan or FDG PET scan. These results will be called to the ordering clinician or representative by the Radiologist Assistant, and communication documented in the PACS or [employer]. Electronically Signed   By: Suzy Bouchard M.D.   On: 11/18/2020 16:42   DG Chest Port 1 View  Result Date: 11/18/2020 CLINICAL DATA:  Abdominal pain and vomiting for 1 week. History of colon cancer. EXAM: PORTABLE CHEST 1 VIEW COMPARISON:  CT chest, abdomen and pelvis 09/01/2020. FINDINGS: Right IJ approach Port-A-Cath is in place. The lungs are emphysematous. A few scattered pulmonary nodules consistent with known metastatic disease are seen. No consolidative process, pneumothorax or effusion. Heart size is normal. Aortic atherosclerosis. IMPRESSION: No acute disease. Pulmonary nodules consistent with known metastatic disease. Aortic Atherosclerosis (ICD10-I70.0) and Emphysema (ICD10-J43.9). Electronically Signed   By:  Inge Rise M.D.   On: 11/18/2020 14:02    EKG: Independently reviewed.  EKG shows sinus tachycardia with no acute ST elevation or depression.  QTc 432  Assessment/Plan Principal Problem:   Colitis Ms. Worlds is admitted to med/surg floor.  She is started on Zosyn for antibiotic coverage. Dilaudid ordered for pain control.  Zofran ordered for nausea and vomiting She has no nausea or vomiting at this time and would like something to drink is placed on clear liquids IV fluid hydration. Recheck electrolytes and renal function in morning  Active Problems:   Leukocytosis Elevated WBC. Recheck CBC in morning    Hypokalemia Check magnesium level.  Supplement potassium provided.  If magnesium is low  will supplement    Colorectal cancer  Oncology following    Elevated blood pressure reading without diagnosis of hypertension She reports that blood pressures been slowly elevating with her chemotherapy treatments and oncology is watching.  We will continue to monitor blood pressure and if remains significantly elevated despite pain control and treatment will discuss initiation of antihypertensive therapy    Port-A-Cath in place     DVT prophylaxis: Lovenox for DVT prophylaxis. Code Status:   Full Code  Family Communication:  Agnes and plan discussed with patient.  She verbalized understanding agrees with plan.  Further recommendations to follow as clinically indicated Disposition Plan:   Patient is from:  Home  Anticipated DC to:  Home  Anticipated DC date:  Anticipate 2 midnight or more stay in the hospital  Anticipated DC barriers: Barriers to discharge identified at this time  Admission status:     Inpatient   Yevonne Aline Moncerrat Burnstein MD [facility]  How to contact the [facility] Attending or Consulting provider [place] or covering provider during after hours [place], for this patient?   1. Check the care team in [facility] and look for a) attending/consulting [facility] provider listed and  b) the [facility] team listed 2. Log into [email or web address] and use [place]'s universal password to access. If you do not have the password, please contact the hospital operator. 3. Locate the [facility] provider you are looking for under [facility] and page to a number that you can be directly reached. 4. If you still have difficulty reaching the provider, please page the [facility] (Director on Call) for the Hospitalists listed on amion for assistance.  11/18/2020, 9:28 PM

## 2020-11-18 NOTE — ED Provider Notes (Signed)
[facility] EMERGENCY DEPT Provider Note   CSN: [id number] Arrival date & time: 11/18/20  1302     History Chief Complaint  Patient presents with  . Abdominal Pain    Tina Morales is a 55 y.o. female.  He has a history of stage IV colorectal cancer and is on active chemotherapy with Dr. Benay Spice.  Last treatment was about 2 weeks ago.  She is complaining of 1 week of crampy generalized abdominal pain nausea vomiting, decreased colostomy output, and a fever to 101.9.  No blood in the vomitus or colostomy output.  She had a bowel obstruction in the past and she says this feels similar.  No cough shortness of breath or urinary symptoms.  Endorses some arthralgias at times.  The history is provided by the patient and the spouse.  Abdominal Pain Pain location:  Generalized Pain quality: cramping   Pain radiates to:  Does not radiate Pain severity:  Severe Onset quality:  Gradual Duration:  1 week Timing:  Intermittent Progression:  Unchanged Chronicity:  New Context: not recent travel, not sick contacts and not trauma   Relieved by:  Nothing Worsened by:  Palpation and movement Ineffective treatments:  OTC medications Associated symptoms: chills, diarrhea, fever, nausea and vomiting   Associated symptoms: no chest pain, no cough, no dysuria, no hematemesis, no hematochezia, no hematuria, no melena, no shortness of breath and no sore throat        Past Medical History:  Diagnosis Date  . Adenocarcinoma ([place]) 02/2018  . Anxiety   . Cancer ([place]) 02/10/2018   Stage 4 Colorectal Cancer, spot on her lungs  . Migraine     Patient Active Problem List   Diagnosis Date Noted  . Dehiscence of perineal wound 10/04/2018  . Wound infection 10/04/2018  . Port-A-Cath in place 07/07/2018  . Thalassemia minor 03/15/2018  . Anemia of chronic disease 03/06/2018  . Metastatic cancer to lung ([place]) 03/06/2018  . Colitis 03/06/2018  . Leukocytosis 03/06/2018  . Rectal cancer  ([place]) 02/20/2018  . Situational anxiety 02/20/2018  . Tobacco use 02/20/2018  . Migraine 01/04/2018    Past Surgical History:  Procedure Laterality Date  . ADENOIDECTOMY    . CYSTOSCOPY W/ RETROGRADES Bilateral 09/01/2018   Procedure: CYSTOSCOPY WITH BILATERAL  RETROGRADE PYELOGRAM;  Surgeon: Alexis Frock, MD;  Location: [facility];  Service: Urology;  Laterality: Bilateral;  . CYSTOSCOPY WITH STENT PLACEMENT Bilateral 09/01/2018   Procedure: CYSTOSCOPY WITH URETERAL COOK CATHETER PLACEMENT;  Surgeon: Alexis Frock, MD;  Location: [facility];  Service: Urology;  Laterality: Bilateral;  . IR CV LINE INJECTION  06/09/2020  . ROBOTIC ASSISTED TOTAL HYSTERECTOMY WITH BILATERAL SALPINGO OOPHERECTOMY Bilateral 09/01/2018   Procedure: XI ROBOTIC ASSISTED PARTIAL VAGINECTOMY;  Surgeon: Everitt Amber, MD;  Location: [facility];  Service: Gynecology;  Laterality: Bilateral;  . TONSILLECTOMY       OB History   No obstetric history on file.     Family History  Problem Relation Age of Onset  . Breast cancer Maternal Aunt   . Throat cancer Cousin   . Cancer Cousin   . Throat cancer Cousin   . Lung cancer Paternal Uncle     Social History   Tobacco Use  . Smoking status: Former Smoker    Years: 30.00    Quit date: 02/2018    Years since quitting: 2.7  . Smokeless tobacco: Never Used  Vaping Use  . Vaping Use: Never used  Substance Use Topics  . Alcohol use:  Not Currently    Comment: per Dr Dema Severin chart, "no drink in the last 5 years"  . Drug use: Never    Home Medications Prior to Admission medications   Medication Sig Start Date End Date Taking? Authorizing Provider  acetaminophen (TYLENOL) 500 MG tablet Take 1,000 mg by mouth every 8 (eight) hours as needed (PAIN).     [provider]  ALPRAZolam Duanne Moron) 0.5 MG tablet Take 1 tablet (0.5 mg total) by mouth 2 (two) times daily as needed for anxiety. 08/14/20   Ladell Pier, MD  bisacodyl (DULCOLAX) 5 MG EC tablet Take 15 mg by  mouth daily as needed for moderate constipation. Takes for few days after each tx     [provider]  cyclobenzaprine (FLEXERIL) 5 MG tablet Take 1 tablet (5 mg total) by mouth 2 (two) times daily as needed for muscle spasms. Patient not taking: No sig reported 03/20/20   Owens Shark, NP  dexamethasone (DECADRON) 4 MG tablet #2 tablets twice daily x 3 days. Start day after chemo treatment 11/05/20   Ladell Pier, MD  diphenoxylate-atropine (LOMOTIL) 2.5-0.025 MG tablet Take 2 tablets by mouth 4 (four) times daily as needed for diarrhea or loose stools. 06/12/20   Ladell Pier, MD  HYDROcodone-acetaminophen (NORCO) 10-325 MG tablet Take 1 tablet by mouth as needed. Patient not taking: No sig reported 05/06/20   [provider]  Ibuprofen 200 MG CAPS Take 600 mg by mouth daily as needed (pain).    [provider]  lidocaine-prilocaine (EMLA) cream Apply 1 application topically as needed (port). 06/30/20   Ladell Pier, MD  loratadine (CLARITIN) 10 MG tablet Take 10 mg by mouth daily.    [provider]  magic mouthwash SOLN Take 5 mLs by mouth 4 (four) times daily as needed for mouth pain. Patient not taking: No sig reported 07/26/19   Ladell Pier, MD  magnesium citrate solution Take 296 mLs by mouth once. 1/2 bottle as needed    [provider]  Magnesium Oxide 250 MG TABS Take 500 mg by mouth 2 (two) times daily.    [provider]  omeprazole (PRILOSEC) 20 MG capsule Take 20 mg by mouth daily as needed.     [provider]  Polyethylene Glycol 3350 (MIRALAX PO) Take 17 g by mouth as needed.    [provider]  potassium chloride SA (KLOR-CON) 20 MEQ tablet Take 1 tablet (20 mEq total) by mouth 2 (two) times daily. 11/05/20   Ladell Pier, MD  prochlorperazine (COMPAZINE) 10 MG tablet Take 1 tablet (10 mg total) by mouth every 6 (six) hours as needed for nausea or vomiting. 06/12/20   Ladell Pier, MD   traMADol (ULTRAM) 50 MG tablet Take 1 tablet (50 mg total) by mouth every 6 (six) hours as needed. Patient not taking: No sig reported 10/18/18   Ladell Pier, MD    Allergies    Sulfa antibiotics  Review of Systems   Review of Systems  Constitutional: Positive for chills and fever.  HENT: Negative for sore throat.   Eyes: Negative for visual disturbance.  Respiratory: Negative for cough and shortness of breath.   Cardiovascular: Negative for chest pain.  Gastrointestinal: Positive for abdominal pain, diarrhea, nausea and vomiting. Negative for hematemesis, hematochezia and melena.  Genitourinary: Negative for dysuria and hematuria.  Musculoskeletal: Negative for neck pain.  Skin: Negative for rash.  Neurological: Negative for headaches.  Physical Exam Updated Vital Signs BP (!) 123/98 (BP Location: Right Arm)   Pulse (!) 140   Temp 99.5 F (37.5 C) (Oral)   Resp 20   SpO2 97%   Physical Exam Vitals and nursing note reviewed.  Constitutional:      General: She is not in acute distress.    Appearance: Normal appearance. She is well-developed.  HENT:     Head: Normocephalic and atraumatic.  Eyes:     Conjunctiva/sclera: Conjunctivae normal.  Cardiovascular:     Rate and Rhythm: Regular rhythm. Tachycardia present.     Heart sounds: No murmur heard.   Pulmonary:     Effort: Pulmonary effort is normal. No respiratory distress.     Breath sounds: Normal breath sounds.     Comments: Port in right upper chest without any overlying erythema or tenderness Abdominal:     General: There is distension.     Palpations: Abdomen is soft.     Tenderness: There is generalized abdominal tenderness. There is no guarding or rebound.     Comments: Colostomy in left lower quadrant with some tan thin stool.  Stoma is pink.  Musculoskeletal:        General: No deformity or signs of injury. Normal range of motion.     Cervical back: Neck supple.  Skin:    General: Skin is warm  and dry.     Capillary Refill: Capillary refill takes less than 2 seconds.  Neurological:     General: No focal deficit present.     Mental Status: She is alert.     ED Results / Procedures / Treatments   Labs (all labs ordered are listed, but only abnormal results are displayed) Labs Reviewed  COMPREHENSIVE METABOLIC PANEL - Abnormal; Notable for the following components:      Result Value   Sodium 134 (*)    Potassium 3.3 (*)    Chloride 95 (*)    Glucose, Bld 117 (*)    Creatinine, Ser 0.43 (*)    Total Protein 6.2 (*)    Alkaline Phosphatase 244 (*)    All other components within normal limits  CBC WITH DIFFERENTIAL/PLATELET - Abnormal; Notable for the following components:   WBC 15.2 (*)    RBC 5.80 (*)    MCV 67.4 (*)    MCH 21.0 (*)    RDW 19.2 (*)    Neutro Abs 13.6 (*)    Lymphs Abs 0.5 (*)    Abs Immature Granulocytes 0.10 (*)    All other components within normal limits  URINALYSIS, ROUTINE W REFLEX MICROSCOPIC - Abnormal; Notable for the following components:   Leukocytes,Ua TRACE (*)    Non Squamous Epithelial 0-5 (*)    All other components within normal limits  RESP PANEL BY RT-PCR (FLU A&B, COVID) ARPGX2  CULTURE, BLOOD (ROUTINE X 2)  CULTURE, BLOOD (ROUTINE X 2)  URINE CULTURE  LIPASE, BLOOD  LACTIC ACID, PLASMA    EKG EKG Interpretation  Date/Time:  Tuesday Nov 18 2020 13:51:05 EDT Ventricular Rate:  139 PR Interval:  85 QRS Duration: 87 QT Interval:  284 QTC Calculation: 432 R Axis:   -4 Text Interpretation: Sinus tachycardia Borderline T abnormalities, diffuse leads increased rate from prior 2/20 Confirmed by Aletta Edouard [phone]) on 11/18/2020 2:03:42 PM   Radiology CT CHEST W CONTRAST  Result Date: 11/18/2020 CLINICAL DATA:  Abdominal pain for 1 week. History of colon cancer. On chemotherapy. Prior colostomy. EXAM: CT CHEST, ABDOMEN, AND PELVIS  WITH CONTRAST TECHNIQUE: Multidetector CT imaging of the chest, abdomen and pelvis was  performed following the standard protocol during bolus administration of intravenous contrast. CONTRAST:  7m OMNIPAQUE IOHEXOL 300 MG/ML  SOLN COMPARISON:  CT 09/01/2020 FINDINGS: CT CHEST FINDINGS Cardiovascular: Port in the anterior chest wall with tip in distal SVC. Coronary artery calcification and aortic atherosclerotic calcification. Mediastinum/Nodes: No axillary or supraclavicular adenopathy. No mediastinal or hilar adenopathy. No pericardial fluid. Esophagus normal. Lungs/Pleura: Bilateral pulmonary nodules again demonstrated. Nodules appear very similar to CT 09/01/2020. Example nodule in the superior segment of the RIGHT lower lobe measures 9 mm (image 55) compared to 10 mm on prior. Nodule in the RIGHT upper lobe measures 9 mm (image 57) compared with 9 mm. Nodule in the lateral aspect of the LEFT lower lobe measures 6 mm (image 85) compared with 5 mm. Peripheral LEFT upper lobe nodule measures 5 mm (image 67) compared with 6 mm. No new nodules are identified. Musculoskeletal: No aggressive osseous lesion. CT ABDOMEN AND PELVIS FINDINGS Hepatobiliary: No focal hepatic lesion. Small amount pericholecystic fluid is new from prior (image 16/series 2). No intrahepatic duct dilatation. Common bile duct normal caliber. Pancreas: Pancreas is normal. No ductal dilatation. No pancreatic inflammation. Spleen: Spleen normal Adrenals/urinary tract: Adrenal glands and kidneys are normal. The ureters and bladder normal. Stomach/Bowel: Stomach and duodenum normal. There is mild submucosal edema within the loops of the small bowel deep within the pelvis (image 107/series 602). The small amount of fluid within the pelvis which is similar prior. There is fluid stool throughout the colon. The distal colon leading up to the ostomy demonstrates sub mucosal edema and mucosal enhancement (image 90/62 for example). There is mucosal enhancement through the colon exiting the ostomy. No evidence of local recurrence in the  proctectomy site. There is no evidence of bowel obstruction. Vascular/Lymphatic: Abdominal aorta is normal caliber with atherosclerotic calcification. There is no retroperitoneal or periportal lymphadenopathy. No pelvic lymphadenopathy. Reproductive: Post hysterectomy.  Adnexa unremarkable Other: Along the posterior margin of the RIGHT hepatic lobe there is peritoneal thickening with potential enhancement. Small amount fluid at this site on comparison exam however the enhancement is new (image 63/62). Nodular enhancement measures 10 mm x 7 mm image 64/602). Nodularity also seen on coronal image 24/605) Musculoskeletal: No aggressive osseous lesion. IMPRESSION: Chest Impression: 1. Stable bilateral pulmonary nodular metastasis. 2. No mediastinal lymphadenopathy. Abdomen / Pelvis Impression: 1. Submucosal edema and mucosal enhancement involving the distal colon leading up to the colostomy as well as loops of small bowel in the pelvis. Findings are most consistent with colitis and enteritis. No high-grade obstruction. a 2. Concern for subtle peritoneal nodular enhancement along the posterior aspect the RIGHT hepatic lobe. Recommend close attention on follow-up with CT scan or FDG PET scan. These results will be called to the ordering clinician or representative by the Radiologist Assistant, and communication documented in the PACS or [employer] Electronically Signed   By: SSuzy BouchardM.D.   On: 11/18/2020 16:42   CT Abdomen Pelvis W Contrast  Result Date: 11/18/2020 CLINICAL DATA:  Abdominal pain for 1 week. History of colon cancer. On chemotherapy. Prior colostomy. EXAM: CT CHEST, ABDOMEN, AND PELVIS WITH CONTRAST TECHNIQUE: Multidetector CT imaging of the chest, abdomen and pelvis was performed following the standard protocol during bolus administration of intravenous contrast. CONTRAST:  727mOMNIPAQUE IOHEXOL 300 MG/ML  SOLN COMPARISON:  CT 09/01/2020 FINDINGS: CT CHEST FINDINGS Cardiovascular: Port  in the anterior chest wall with tip in distal  SVC. Coronary artery calcification and aortic atherosclerotic calcification. Mediastinum/Nodes: No axillary or supraclavicular adenopathy. No mediastinal or hilar adenopathy. No pericardial fluid. Esophagus normal. Lungs/Pleura: Bilateral pulmonary nodules again demonstrated. Nodules appear very similar to CT 09/01/2020. Example nodule in the superior segment of the RIGHT lower lobe measures 9 mm (image 55) compared to 10 mm on prior. Nodule in the RIGHT upper lobe measures 9 mm (image 57) compared with 9 mm. Nodule in the lateral aspect of the LEFT lower lobe measures 6 mm (image 85) compared with 5 mm. Peripheral LEFT upper lobe nodule measures 5 mm (image 67) compared with 6 mm. No new nodules are identified. Musculoskeletal: No aggressive osseous lesion. CT ABDOMEN AND PELVIS FINDINGS Hepatobiliary: No focal hepatic lesion. Small amount pericholecystic fluid is new from prior (image 16/series 2). No intrahepatic duct dilatation. Common bile duct normal caliber. Pancreas: Pancreas is normal. No ductal dilatation. No pancreatic inflammation. Spleen: Spleen normal Adrenals/urinary tract: Adrenal glands and kidneys are normal. The ureters and bladder normal. Stomach/Bowel: Stomach and duodenum normal. There is mild submucosal edema within the loops of the small bowel deep within the pelvis (image 107/series 602). The small amount of fluid within the pelvis which is similar prior. There is fluid stool throughout the colon. The distal colon leading up to the ostomy demonstrates sub mucosal edema and mucosal enhancement (image 90/62 for example). There is mucosal enhancement through the colon exiting the ostomy. No evidence of local recurrence in the proctectomy site. There is no evidence of bowel obstruction. Vascular/Lymphatic: Abdominal aorta is normal caliber with atherosclerotic calcification. There is no retroperitoneal or periportal lymphadenopathy. No pelvic  lymphadenopathy. Reproductive: Post hysterectomy.  Adnexa unremarkable Other: Along the posterior margin of the RIGHT hepatic lobe there is peritoneal thickening with potential enhancement. Small amount fluid at this site on comparison exam however the enhancement is new (image 63/62). Nodular enhancement measures 10 mm x 7 mm image 64/602). Nodularity also seen on coronal image 24/605) Musculoskeletal: No aggressive osseous lesion. IMPRESSION: Chest Impression: 1. Stable bilateral pulmonary nodular metastasis. 2. No mediastinal lymphadenopathy. Abdomen / Pelvis Impression: 1. Submucosal edema and mucosal enhancement involving the distal colon leading up to the colostomy as well as loops of small bowel in the pelvis. Findings are most consistent with colitis and enteritis. No high-grade obstruction. a 2. Concern for subtle peritoneal nodular enhancement along the posterior aspect the RIGHT hepatic lobe. Recommend close attention on follow-up with CT scan or FDG PET scan. These results will be called to the ordering clinician or representative by the Radiologist Assistant, and communication documented in the PACS or [employer]. Electronically Signed   By: Suzy Bouchard M.D.   On: 11/18/2020 16:42   DG Chest Port 1 View  Result Date: 11/18/2020 CLINICAL DATA:  Abdominal pain and vomiting for 1 week. History of colon cancer. EXAM: PORTABLE CHEST 1 VIEW COMPARISON:  CT chest, abdomen and pelvis 09/01/2020. FINDINGS: Right IJ approach Port-A-Cath is in place. The lungs are emphysematous. A few scattered pulmonary nodules consistent with known metastatic disease are seen. No consolidative process, pneumothorax or effusion. Heart size is normal. Aortic atherosclerosis. IMPRESSION: No acute disease. Pulmonary nodules consistent with known metastatic disease. Aortic Atherosclerosis (ICD10-I70.0) and Emphysema (ICD10-J43.9). Electronically Signed   By: Inge Rise M.D.   On: 11/18/2020 14:02     Procedures Procedures   Medications Ordered in ED Medications  sodium chloride 0.9 % bolus 1,000 mL (0 mLs Intravenous Stopped 11/18/20 1608)  HYDROmorphone (DILAUDID) injection 1 mg (0.5  mg Intravenous Given 11/18/20 1436)  ondansetron (ZOFRAN) injection 4 mg (4 mg Intravenous Given 11/18/20 1432)  iohexol (OMNIPAQUE) 300 MG/ML solution 100 mL (75 mLs Intravenous Contrast Given 11/18/20 1517)  sodium chloride 0.9 % bolus 1,000 mL (1,000 mLs Intravenous New Bag/Given 11/18/20 1609)    ED Course  I have reviewed the triage vital signs and the nursing notes.  Pertinent labs & imaging results that were available during my care of the patient were reviewed by me and considered in my medical decision making (see chart for details).  Clinical Course as of 11/18/20 1707  Tue Nov 18, 2020  1405 Chest x-ray showing some pulmonary nodules and report, no gross infiltrates. [MB]  3202 Patient states she is starting to feel little better.  Heart rates come down to 120s. [MB]    Clinical Course User Index [MB] Hayden Rasmussen, MD   MDM Rules/Calculators/A&P                         This patient complains of crampy abdominal pain nausea vomiting and decreased output from her colostomy; this involves an extensive number of treatment Options and is a complaint that carries with it a high risk of complications and Morbidity. The differential includes obstruction, P SBO, colitis, diverticulitis, peptic ulcer disease, dehydration, metabolic derangement, renal failure  I ordered, reviewed and interpreted labs, which included CBC with elevated white count, normal hemoglobin, chemistries with mildly low potassium and sodium, alk phos elevated, LFTs and lipase otherwise normal, lactate not elevated, and urinalysis without obvious signs of infection, lactic acid not elevated, COVID and flu testing negative I ordered medication IV pain medication nausea medication and fluids I ordered imaging studies which  included chest x-ray and CT abdomen and pelvis and I independently    visualized and interpreted imaging which showed pulmonary nodules no acute infiltrates.  CT is pending at time of signout. Additional history obtained from patient's husband and oncology Dr. Benay Spice who came down to see the patient.. Previous records obtained and reviewed prior oncology notes.  After the interventions stated above, I reevaluated the patient and found patient symptoms to be improved although she remains tachycardic.  Her care is signed out to oncoming provider Dr. Tomi Bamberger who will follow-up on results of CT and pending lab test.  Likely will need admission for further management.  Sharnee Douglass was evaluated in Emergency Department on 11/18/2020 for the symptoms described in the history of present illness. She was evaluated in the context of the global COVID-19 pandemic, which necessitated consideration that the patient might be at risk for infection with the SARS-CoV-2 virus that causes COVID-19. Institutional protocols and algorithms that pertain to the evaluation of patients at risk for COVID-19 are in a state of rapid change based on information released by regulatory bodies including the CDC and federal and state organizations. These policies and algorithms were followed during the patient's care in the ED.   Final Clinical Impression(s) / ED Diagnoses Final diagnoses:  Colitis  Colorectal cancer [facility])    Rx / DC Orders ED Discharge Orders    None       Hayden Rasmussen, MD 11/18/20 1710

## 2020-11-18 NOTE — ED Notes (Signed)
Dilaudid administered by this Rn, charted under IB., RN by error

## 2020-11-18 NOTE — Plan of Care (Signed)
[facility] Plan of Care Note  Patient: Tina Morales    YNW:[id number]  PCP: [facility]  DOB: Jan 22, 1966  DOS: 11/18/2020   Received a phone call from [facility], regarding transfer of Tina Morales. Requesting: Dr. Tomi Bamberger EDP Reason for transfer: Admission for colitis History: Past medical history of metastatic rectal cancer on active treatment, last cycle on 11/05/2020.  Presents with complaints of abdominal pain as well as temperature of 101.9.  Also has nausea with vomiting and intermittent chills.  No diarrhea reported. Work up: CT abdomen shows colitis of distal colon leading up to colostomy as well as enteritis without any obstruction.  Also has leukocytosis Treatment received: IV antibiotics.  IV fluids. In the setting of abdominal pain with difficulty to tolerate p.o. will accept for admission for observation  Plan of care: The patient will be accepted for admission to telemetry unit, [facility],.  Author: Berle Mull, MD [facility] 11/18/2020  If 7PM-7AM, please contact night-coverage at [email or web address],

## 2020-11-18 NOTE — Progress Notes (Addendum)
IP PROGRESS NOTE  Subjective: Tina Morales is a 55 year old woman with metastatic rectal cancer currently on active treatment with FOLFOX/Avastin.  She completed cycle 14 on 11/05/2020.  She presents to the emergency department today with abdominal pain and fever.  She reports constipation after chemotherapy then onset of crampy abdominal pain and decreased output from the ostomy about a week ago.  Abdomen feels distended.  She is having nausea/vomiting.  She has been having intermittent chills, last night temperature 101.9.  Objective: Vital signs in last 24 hours: Temp:  [99.5 F (37.5 C)] 99.5 F (37.5 C) (05/10 1314) Pulse Rate:  [140] 140 (05/10 1314) Resp:  [20] 20 (05/10 1314) BP: (123)/(98) 123/98 (05/10 1314) SpO2:  [97 %] 97 % (05/10 1314)  Intake/Output from previous day: No intake/output data recorded. Intake/Output this shift: No intake/output data recorded.  HEENT: Mouth is dry appearing.  Buccal mucosa/posterior palate erythematous.  No thrush. Resp: Lungs clear bilaterally. Cardio: Regular, tachycardic. GI: Soft, generalized tenderness.  No hepatomegaly.  Left lower quadrant colostomy. Vascular: No leg edema. Port-A-Cath without erythema.  Lab Results:  No results for input(s): WBC, HGB, HCT, PLT in the last 72 hours. BMET No results for input(s): NA, K, CL, CO2, GLUCOSE, BUN, CREATININE, CALCIUM in the last 72 hours.  Studies/Results: DG Chest Port 1 View  Result Date: 11/18/2020 CLINICAL DATA:  Abdominal pain and vomiting for 1 week. History of colon cancer. EXAM: PORTABLE CHEST 1 VIEW COMPARISON:  CT chest, abdomen and pelvis 09/01/2020. FINDINGS: Right IJ approach Port-A-Cath is in place. The lungs are emphysematous. A few scattered pulmonary nodules consistent with known metastatic disease are seen. No consolidative process, pneumothorax or effusion. Heart size is normal. Aortic atherosclerosis. IMPRESSION: No acute disease. Pulmonary nodules consistent with known  metastatic disease. Aortic Atherosclerosis (ICD10-I70.0) and Emphysema (ICD10-J43.9). Electronically Signed   By: Inge Rise M.D.   On: 11/18/2020 14:02    Assessment/Plan: 1.  Rectal cancer-rectal mass noted on digital exam 02/10/2018, colonoscopy confirmed a him my circumferential mass in the rectum ? 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 CTs 02/10/2018- anterior rectal wall thickening, pulmonary nodules measuring up to 9 mm concerning for metastases, few round perirectal lymph nodes ? Pelvic MRI 1/95/0932- hypermetabolic low rectal mass extending to the posterior vagina with 2 small enlarged perirectal lymph nodes, T4b,N1- 2.3 cm from the anal verge ? PET scan 02/23/2018-hypermetabolic rectal mass, 8 mm hypermetabolic lingular nodule, scattered small bilateral lung nodules, some calcified, a few with mildly increased activity ? Cycle 1 FOLFOXIRI 03/01/2018 ? Cycle 5 FOLFOXIRI 04/26/2018 ? CTs 05/01/2018-significant interval response to therapy with decreased size of the primary rectal mass lesion.  Decreasing perirectal lymphadenopathy.  Decreased and/or resolved pulmonary nodules.  No new sites of disease identified. ? Cycle 6 FOLFOXIRI 05/11/2018 ? Radiation/Xeloda 06/12/2018-completed 07/21/2018 ? Xeloda dose reduced 07/10/2018 due to mucositis and diarrhea ? CTs 08/07/2018- compared to 02/10/2018- resolved and decreased pulmonary nodules, few tiny residual noncalcified nodules, decreased 20 perirectal lymph nodes, soft tissue of the rectum indistinguishable from posterior wall of vagina-  Vaginal involvement by tumor? ? APR/vaginectomy 09/01/2018-ypT4,ypNo, negative resection margins, involvement of the vagina per review of slides at GI tumor conference, no  treatment effect-tumor regression score 3, no loss of mismatch repair protein expression, MSI-stable, KRAS G12V ? K-ras G12V mutation ? CT  chest 09/29/2018- enlargement of lung nodules, not a candidate for SBRT based on discussion in GI tumor conference and with radiation oncology ? CT chest 11/27/18 - interval growth of numerous pulmonary metastases bilaterally compared to 09/29/18, no new pulmonary mets  ? Cycle 1 FOLFOXIRI 11/29/18  ? Cycle 2 FOLFOXIRI 12/14/2018 ? Cycle 3 FOLFOXIRI 12/28/2018 ? Cycle 4 FOLFIRINOX 01/11/2019 ? CTs 01/24/2019-significant improvement of bilateral pulmonary metastases. ? Cycle 1 FOLFIRI 02/01/2019 ? Cycle 2 FOLFIRI 02/22/2019 ? Cycle 3 FOLFIRI 03/15/2019 ? Cycle 4 FOLFIRI 04/05/2019 ? Cycle 5 FOLFIRI 04/26/2019 ? CTs 05/14/2019-slight enlargement of right lung nodules, no other evidence of disease progression ? Cycle 6 FOLFIRI 05/17/2019 ? Cycle 7 FOLFIRI plus Avastin 06/13/2019 ? Cycle 8 FOLFIRI plus Avastin 07/09/2019 ? Cycle 9 FOLFIRI plus Avastin 07/25/2019 ? Cycle 10 FOLFIRI plus Avastin 08/09/2019 ? Cycle 11 FOLFIRI plus Avastin 08/22/2019 ? CTs 09/07/2019-most pulmonary nodules are stable.  1 nodule right lower lobe slightly larger.  No new lung lesions. ? Cycle 12 FOLFIRI plus Avastin 09/13/2019 ? Cycle 13 FOLFIRI plus Avastin 10/04/2019 ? Cycle 14 FOLFIRI plus Avastin 10/25/2019 ? Cycle 15 FOLFIRI plus Avastin 11/14/2019 ? Cycle 16 FOLFIRI plus Avastin 12/06/2019 ? CTs neck, chest, abdomen, pelvis 12/21/2019-neck negative; mildly progressive pulmonary metastases; abdomen and pelvis negative for evidence of metastatic disease. ? Cycle 1 FOLFOX/Avastin 12/27/2019 ? Cycle 2 FOLFOX/Avastin 01/17/2020 ? Cycle 3 FOLFOX/Avastin 02/07/2020 ? Cycle 4 FOLFOX 02/28/2020, Avastin held due to need for dental evaluation possible extractions ? Cycle 5 FOLFOX 03/20/2020, Avastin held pending dental evaluation ? CTs 04/04/2020-increased size of pulmonary nodules, no evidence of metastatic  disease in the abdomen or pelvis ? Cycle 6 FOLFOX 05/22/2020, Avastin held secondary to dental extractions ? Cycle 7 FOLFOX/Avastin 06/12/2020 ? Cycle 8 FOLFOX/Avastin 06/30/2020 ? Cycle 9 FOLFOX/Avastin 07/24/2020 ? Cycle 10 FOLFOX/Avastin 08/14/2020 ? CTs 09/01/2020-improvement in bilateral pulmonary metastases. ? Cycle 11 FOLFOX/Avastin 09/04/2020 ? Cycle 12 FOLFOX/Avastin 09/25/2020 ? Cycle 13 FOLFOX/Avastin 10/15/2020 ? Cycle 14 FOLFOX/Avastin 11/05/2020   2.   History of diarrhea and rectal pain secondary to #1 3.   History of tobacco use 4.   Anemia secondary to thalassemia, rectal bleeding, and potentially  iron deficiency 5.   Diarrhea secondary to Xeloda and radiation.  Imodium as needed. 6.   Mucositis secondary to Xeloda.  Improved 07/19/2018. 7.   History of migraines 8.   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    Hypertension 10.  Oxaliplatin neuropathy   LOS: 0 days    Tina Morales is a 55 year old woman with metastatic rectal cancer currently on active treatment with FOLFOX/Avastin, cycle 14 on 11/05/2020.  She presents with abdominal pain, nausea/vomiting, decreased ostomy output, fever/chills.  Labs and cultures pending.  Spoke with Dr. Melina Copa in the emergency room.  CT abdomen is planned.  Ned Card, ANP/GNP-BC 11/18/2020 2:09 PM Ms. Brun was interviewed and examined.  She is well-known to [place] with a history of metastatic rectal cancer, now at day 14 following the most recent cycle of FOLFOX/Avastin.  She presents with abdominal pain, fever, and nausea/vomiting.  Her symptoms could be related to a bowel obstruction or intra-abdominal infection.  She does not have known disease in the abdomen or pelvis.  She appears dehydrated.  We will follow-up on labs and the abdominal CT in the emergency room and coordinate hospital admission with the emergency room physician as indicated.  I will see her in the hospital if she is admitted. I was present for greater than 50% of today's  visit.  I performed  medical decision making.

## 2020-11-19 DIAGNOSIS — E876 Hypokalemia: Secondary | ICD-10-CM

## 2020-11-19 DIAGNOSIS — R030 Elevated blood-pressure reading, without diagnosis of hypertension: Secondary | ICD-10-CM

## 2020-11-19 DIAGNOSIS — D72829 Elevated white blood cell count, unspecified: Secondary | ICD-10-CM

## 2020-11-19 LAB — BASIC METABOLIC PANEL
Anion gap: 6 (ref 5–15)
BUN: 7 mg/dL (ref 6–20)
CO2: 25 mmol/L (ref 22–32)
Calcium: 7.9 mg/dL — ABNORMAL LOW (ref 8.9–10.3)
Chloride: 103 mmol/L (ref 98–111)
Creatinine, Ser: 0.35 mg/dL — ABNORMAL LOW (ref 0.44–1.00)
GFR, Estimated: >60 mL/min (ref >60)
Glucose, Bld: 93 mg/dL (ref 70–99)
Potassium: 3.8 mmol/L (ref 3.5–5.1)
Sodium: 134 mmol/L — ABNORMAL LOW (ref 135–145)

## 2020-11-19 LAB — CBC
HCT: 29.5 % — ABNORMAL LOW (ref 36.0–46.0)
Hemoglobin: 9.3 g/dL — ABNORMAL LOW (ref 12.0–15.0)
MCH: 21.6 pg — ABNORMAL LOW (ref 26.0–34.0)
MCHC: 31.5 g/dL (ref 30.0–36.0)
MCV: 68.6 fL — ABNORMAL LOW (ref 80.0–100.0)
Platelets: 157 10*3/uL (ref 150–400)
RBC: 4.3 MIL/uL (ref 3.87–5.11)
RDW: 17.7 % — ABNORMAL HIGH (ref 11.5–15.5)
WBC: 10.3 10*3/uL (ref 4.0–10.5)
nRBC: 0 % (ref 0.0–0.2)

## 2020-11-19 LAB — MAGNESIUM: Magnesium: 1.9 mg/dL (ref 1.7–2.4)

## 2020-11-19 LAB — HIV ANTIBODY (ROUTINE TESTING W REFLEX): HIV Screen 4th Generation wRfx: NONREACTIVE

## 2020-11-19 MED ORDER — PANTOPRAZOLE SODIUM 40 MG PO TBEC
40.0000 mg | DELAYED_RELEASE_TABLET | Freq: Every day | ORAL | Status: DC
  Administered 2020-11-19 – 2020-11-20 (×2): 40 mg via ORAL
  Filled 2020-11-19 (×2): qty 1

## 2020-11-19 MED ORDER — BOOST / RESOURCE BREEZE PO LIQD CUSTOM
1.0000 | Freq: Three times a day (TID) | ORAL | Status: DC
  Administered 2020-11-19: 1 via ORAL

## 2020-11-19 MED ORDER — CHLORHEXIDINE GLUCONATE CLOTH 2 % EX PADS
6.0000 | MEDICATED_PAD | Freq: Every day | CUTANEOUS | Status: DC
  Administered 2020-11-19 – 2020-11-20 (×2): 6 via TOPICAL

## 2020-11-19 MED ORDER — SODIUM CHLORIDE 0.9% FLUSH
10.0000 mL | Freq: Two times a day (BID) | INTRAVENOUS | Status: DC
Start: 2020-11-19 — End: 2020-11-20
  Administered 2020-11-19 – 2020-11-20 (×2): 10 mL

## 2020-11-19 MED ORDER — PROCHLORPERAZINE EDISYLATE 10 MG/2ML IJ SOLN
10.0000 mg | Freq: Four times a day (QID) | INTRAMUSCULAR | Status: DC | PRN
  Administered 2020-11-19: 10 mg via INTRAVENOUS
  Filled 2020-11-19: qty 2

## 2020-11-19 MED ORDER — CALCIUM CARBONATE ANTACID 500 MG PO CHEW
1.0000 | CHEWABLE_TABLET | Freq: Three times a day (TID) | ORAL | Status: DC | PRN
  Administered 2020-11-19: 200 mg via ORAL
  Filled 2020-11-19: qty 1

## 2020-11-19 MED ORDER — SODIUM CHLORIDE 0.9% FLUSH: 10.0000 mL | INTRAVENOUS | Status: DC | PRN

## 2020-11-19 NOTE — Plan of Care (Signed)

## 2020-11-19 NOTE — Progress Notes (Signed)
Initial Nutrition Assessment  INTERVENTION:   -Boost Breeze po TID, each supplement provides 250 kcal and 9 grams of protein  Once diet advanced: -Ensure Enlive po BID, each supplement provides 350 kcal and 20 grams of protein  NUTRITION DIAGNOSIS:   Increased nutrient needs related to cancer and cancer related treatments as evidenced by estimated needs.  GOAL:   Patient will meet greater than or equal to 90% of their needs  MONITOR:   PO intake,Supplement acceptance,Weight trends,Labs,I & O's  REASON FOR ASSESSMENT:   Malnutrition Screening Tool    ASSESSMENT:   55 y.o. female with medical history significant for stage IV colorectal cancer and is on active chemotherapy with Dr. Benay Spice.  Last treatment was about 2 weeks ago.  1 week history of cramping abdominal pain associated with nausea vomiting and decreased colostomy output the last few days. Admitted for colitis.  Patient in room with husband at bedside. Pt states she has been tolerating clears well. Consumed 100% of trays. Trying to drink plenty of fluids. Has not been eating well since her last treatment  d/t abdominal pain and N/V. States normally she has had a decrease in appetite but consumes small frequent meals. Very aware of need for increased protein. Has tried many protein shake products. Favorite is Fairlife protein shake with 30g protein.  Is willing to try Boost Breeze supplements while on clears. Will order Ensure Enlive once diet advanced.  Per weight records, pt has lost 11 lbs since 05/14/20 (7% wt loss x 6 months, insignificant for time frame).  Medications: Compazine  Labs reviewed: Low Na  NUTRITION - FOCUSED PHYSICAL EXAM:  Flowsheet Row Most Recent Value  Orbital Region No depletion  Upper Arm Region No depletion  Thoracic and Lumbar Region Unable to assess  Buccal Region No depletion  Temple Region No depletion  Clavicle Bone Region Mild depletion  Clavicle and Acromion Bone Region Mild  depletion  Scapular Bone Region No depletion  Dorsal Hand No depletion  Patellar Region Unable to assess  Anterior Thigh Region Unable to assess  Posterior Calf Region Unable to assess  Edema (RD Assessment) None       Diet Order:   Diet Order            Diet clear liquid Room service appropriate? Yes; Fluid consistency: Thin  Diet effective now                 EDUCATION NEEDS:   Education needs have been addressed  Skin:  Skin Assessment: Reviewed RN Assessment  Last BM:  5/11 - colostomy  Height:   Ht Readings from Last 1 Encounters:  11/18/20 5\' 8"  (1.727 m)    Weight:   Wt Readings from Last 1 Encounters:  11/18/20 59.1 kg   BMI:  Body mass index is 19.81 kg/m.  Estimated Nutritional Needs:   Kcal:  1800-2000  Protein:  90-105g  Fluid:  2L/day   Clayton Bibles, MS, RD, LDN Inpatient Clinical Dietitian Contact information available via Amion

## 2020-11-19 NOTE — Progress Notes (Signed)
IP PROGRESS NOTE  Subjective:   She reports feeling better.  No nausea or vomiting.  Minimal output from the colostomy.  Objective: Vital signs in last 24 hours: Blood pressure (!) 145/94, pulse (!) 102, temperature 98.1 F (36.7 C), temperature source Oral, resp. rate 16, height $RemoveBe'5\' 8"'rIfIQWrEz$  (1.727 m), weight 130 lb 4.7 oz (59.1 kg), SpO2 97 %.  Intake/Output from previous day: 05/10 0701 - 05/11 0700 In: 1955.5 [IV Piggyback:1955.5] Out: -   Physical Exam:  HEENT: No thrush or ulcers.  Mild erythema at the upper pharynx and buccal mucosa. Lungs: Clear bilaterally Cardiac: Regular rate and rhythm Abdomen: Nontender, soft, left lower quadrant colostomy with a small amount of light brown liquid stool Extremities: No leg edema   Portacath/PICC-without erythema  Lab Results: Recent Labs    11/18/20 1346 11/19/20 0018  WBC 15.2* 10.3  HGB 12.2 9.3*  HCT 39.1 29.5*  PLT 165 157    BMET Recent Labs    11/18/20 1346 11/19/20 0018  NA 134* 134*  K 3.3* 3.8  CL 95* 103  CO2 26 25  GLUCOSE 117* 93  BUN 10 7  CREATININE 0.43* 0.35*  CALCIUM 9.0 7.9*    Lab Results  Component Value Date   CEA1 6.57 (H) 11/05/2020    Studies/Results: CT CHEST W CONTRAST  Result Date: 11/18/2020 CLINICAL DATA:  Abdominal pain for 1 week. History of colon cancer. On chemotherapy. Prior colostomy. EXAM: CT CHEST, ABDOMEN, AND PELVIS WITH CONTRAST TECHNIQUE: Multidetector CT imaging of the chest, abdomen and pelvis was performed following the standard protocol during bolus administration of intravenous contrast. CONTRAST:  65mL OMNIPAQUE IOHEXOL 300 MG/ML  SOLN COMPARISON:  CT 09/01/2020 FINDINGS: CT CHEST FINDINGS Cardiovascular: Port in the anterior chest wall with tip in distal SVC. Coronary artery calcification and aortic atherosclerotic calcification. Mediastinum/Nodes: No axillary or supraclavicular adenopathy. No mediastinal or hilar adenopathy. No pericardial fluid. Esophagus normal.  Lungs/Pleura: Bilateral pulmonary nodules again demonstrated. Nodules appear very similar to CT 09/01/2020. Example nodule in the superior segment of the RIGHT lower lobe measures 9 mm (image 55) compared to 10 mm on prior. Nodule in the RIGHT upper lobe measures 9 mm (image 57) compared with 9 mm. Nodule in the lateral aspect of the LEFT lower lobe measures 6 mm (image 85) compared with 5 mm. Peripheral LEFT upper lobe nodule measures 5 mm (image 67) compared with 6 mm. No new nodules are identified. Musculoskeletal: No aggressive osseous lesion. CT ABDOMEN AND PELVIS FINDINGS Hepatobiliary: No focal hepatic lesion. Small amount pericholecystic fluid is new from prior (image 16/series 2). No intrahepatic duct dilatation. Common bile duct normal caliber. Pancreas: Pancreas is normal. No ductal dilatation. No pancreatic inflammation. Spleen: Spleen normal Adrenals/urinary tract: Adrenal glands and kidneys are normal. The ureters and bladder normal. Stomach/Bowel: Stomach and duodenum normal. There is mild submucosal edema within the loops of the small bowel deep within the pelvis (image 107/series 602). The small amount of fluid within the pelvis which is similar prior. There is fluid stool throughout the colon. The distal colon leading up to the ostomy demonstrates sub mucosal edema and mucosal enhancement (image 90/62 for example). There is mucosal enhancement through the colon exiting the ostomy. No evidence of local recurrence in the proctectomy site. There is no evidence of bowel obstruction. Vascular/Lymphatic: Abdominal aorta is normal caliber with atherosclerotic calcification. There is no retroperitoneal or periportal lymphadenopathy. No pelvic lymphadenopathy. Reproductive: Post hysterectomy.  Adnexa unremarkable Other: Along the posterior margin of the RIGHT  hepatic lobe there is peritoneal thickening with potential enhancement. Small amount fluid at this site on comparison exam however the enhancement is  new (image 63/62). Nodular enhancement measures 10 mm x 7 mm image 64/602). Nodularity also seen on coronal image 24/605) Musculoskeletal: No aggressive osseous lesion. IMPRESSION: Chest Impression: 1. Stable bilateral pulmonary nodular metastasis. 2. No mediastinal lymphadenopathy. Abdomen / Pelvis Impression: 1. Submucosal edema and mucosal enhancement involving the distal colon leading up to the colostomy as well as loops of small bowel in the pelvis. Findings are most consistent with colitis and enteritis. No high-grade obstruction. a 2. Concern for subtle peritoneal nodular enhancement along the posterior aspect the RIGHT hepatic lobe. Recommend close attention on follow-up with CT scan or FDG PET scan. These results will be called to the ordering clinician or representative by the Radiologist Assistant, and communication documented in the PACS or [employer]. Electronically Signed   By: Suzy Bouchard M.D.   On: 11/18/2020 16:42   CT Abdomen Pelvis W Contrast  Result Date: 11/18/2020 CLINICAL DATA:  Abdominal pain for 1 week. History of colon cancer. On chemotherapy. Prior colostomy. EXAM: CT CHEST, ABDOMEN, AND PELVIS WITH CONTRAST TECHNIQUE: Multidetector CT imaging of the chest, abdomen and pelvis was performed following the standard protocol during bolus administration of intravenous contrast. CONTRAST:  31m OMNIPAQUE IOHEXOL 300 MG/ML  SOLN COMPARISON:  CT 09/01/2020 FINDINGS: CT CHEST FINDINGS Cardiovascular: Port in the anterior chest wall with tip in distal SVC. Coronary artery calcification and aortic atherosclerotic calcification. Mediastinum/Nodes: No axillary or supraclavicular adenopathy. No mediastinal or hilar adenopathy. No pericardial fluid. Esophagus normal. Lungs/Pleura: Bilateral pulmonary nodules again demonstrated. Nodules appear very similar to CT 09/01/2020. Example nodule in the superior segment of the RIGHT lower lobe measures 9 mm (image 55) compared to 10 mm on prior.  Nodule in the RIGHT upper lobe measures 9 mm (image 57) compared with 9 mm. Nodule in the lateral aspect of the LEFT lower lobe measures 6 mm (image 85) compared with 5 mm. Peripheral LEFT upper lobe nodule measures 5 mm (image 67) compared with 6 mm. No new nodules are identified. Musculoskeletal: No aggressive osseous lesion. CT ABDOMEN AND PELVIS FINDINGS Hepatobiliary: No focal hepatic lesion. Small amount pericholecystic fluid is new from prior (image 16/series 2). No intrahepatic duct dilatation. Common bile duct normal caliber. Pancreas: Pancreas is normal. No ductal dilatation. No pancreatic inflammation. Spleen: Spleen normal Adrenals/urinary tract: Adrenal glands and kidneys are normal. The ureters and bladder normal. Stomach/Bowel: Stomach and duodenum normal. There is mild submucosal edema within the loops of the small bowel deep within the pelvis (image 107/series 602). The small amount of fluid within the pelvis which is similar prior. There is fluid stool throughout the colon. The distal colon leading up to the ostomy demonstrates sub mucosal edema and mucosal enhancement (image 90/62 for example). There is mucosal enhancement through the colon exiting the ostomy. No evidence of local recurrence in the proctectomy site. There is no evidence of bowel obstruction. Vascular/Lymphatic: Abdominal aorta is normal caliber with atherosclerotic calcification. There is no retroperitoneal or periportal lymphadenopathy. No pelvic lymphadenopathy. Reproductive: Post hysterectomy.  Adnexa unremarkable Other: Along the posterior margin of the RIGHT hepatic lobe there is peritoneal thickening with potential enhancement. Small amount fluid at this site on comparison exam however the enhancement is new (image 63/62). Nodular enhancement measures 10 mm x 7 mm image 64/602). Nodularity also seen on coronal image 24/605) Musculoskeletal: No aggressive osseous lesion. IMPRESSION: Chest Impression: 1. Stable bilateral  pulmonary nodular metastasis. 2. No mediastinal lymphadenopathy. Abdomen / Pelvis Impression: 1. Submucosal edema and mucosal enhancement involving the distal colon leading up to the colostomy as well as loops of small bowel in the pelvis. Findings are most consistent with colitis and enteritis. No high-grade obstruction. a 2. Concern for subtle peritoneal nodular enhancement along the posterior aspect the RIGHT hepatic lobe. Recommend close attention on follow-up with CT scan or FDG PET scan. These results will be called to the ordering clinician or representative by the Radiologist Assistant, and communication documented in the PACS or [employer]. Electronically Signed   By: Suzy Bouchard M.D.   On: 11/18/2020 16:42   DG Chest Port 1 View  Result Date: 11/18/2020 CLINICAL DATA:  Abdominal pain and vomiting for 1 week. History of colon cancer. EXAM: PORTABLE CHEST 1 VIEW COMPARISON:  CT chest, abdomen and pelvis 09/01/2020. FINDINGS: Right IJ approach Port-A-Cath is in place. The lungs are emphysematous. A few scattered pulmonary nodules consistent with known metastatic disease are seen. No consolidative process, pneumothorax or effusion. Heart size is normal. Aortic atherosclerosis. IMPRESSION: No acute disease. Pulmonary nodules consistent with known metastatic disease. Aortic Atherosclerosis (ICD10-I70.0) and Emphysema (ICD10-J43.9). Electronically Signed   By: Inge Rise M.D.   On: 11/18/2020 14:02    Medications: I have reviewed the patient's current medications.  Assessment/Plan: 1. Rectal cancer-rectal mass noted on digital exam 02/10/2018, colonoscopy confirmed a him my circumferential mass in the rectum ? 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 CTs 02/10/2018-anterior rectal wall thickening, pulmonary nodules measuring up to 9 mm concerning for metastases, few round perirectal lymph nodes ? Pelvic MRI 02/20/2018-hypermetabolic low rectal mass extending to the posterior vagina with 2 small enlarged perirectal lymph nodes, T4b,N1-2.3 cm from the anal verge ? PET scan 02/23/2018-hypermetabolic rectal mass, 8 mm hypermetabolic lingular nodule, scattered small bilateral lung nodules, some calcified, a few with mildly increased activity ? Cycle 1 FOLFOXIRI8/21/2019 ? Cycle 5 FOLFOXIRI10/16/2019 ? CTs 05/01/2018-significant interval response to therapy with decreased size of the primary rectal mass lesion. Decreasing perirectal lymphadenopathy. Decreased and/or resolved pulmonary nodules. No new sites of disease identified. ? Cycle 6 FOLFOXIRI10/31/2019 ? Radiation/Xeloda 06/12/2018-completed 07/21/2018 ? Xeloda dose reduced 07/10/2018 due to mucositis and diarrhea ? CTs 08/07/2018-compared to 02/10/2018-resolved and decreased pulmonary nodules, few tiny residual noncalcified nodules, decreased 20 perirectal lymph nodes, soft tissue of the rectum indistinguishable from posterior wall of vagina-Vaginal involvement by tumor? ? 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 CT chest 09/29/2018-enlargement of lung nodules, not a candidate for SBRT based on discussion in GI tumor conference and with radiation oncology ? CT chest 11/27/18 - interval growth of numerous pulmonary metastases bilaterally compared to 09/29/18, no new pulmonary mets ? Cycle 1FOLFOXIRI 11/29/18 ? Cycle 2 FOLFOXIRI6/10/2018 ? Cycle 3 FOLFOXIRI 12/28/2018 ? Cycle 4 FOLFIRINOX 01/11/2019 ? CTs 01/24/2019-significant improvement of bilateral  pulmonary metastases. ? Cycle 1 FOLFIRI 02/01/2019 ? Cycle 2 FOLFIRI 02/22/2019 ? Cycle 3 FOLFIRI 03/15/2019 ? Cycle 4 FOLFIRI 04/05/2019 ? Cycle 5 FOLFIRI 04/26/2019 ? CTs 05/14/2019-slight enlargement of right lung nodules, no other evidence of disease progression ? Cycle 6 FOLFIRI 05/17/2019 ? Cycle 7 FOLFIRI plus Avastin 06/13/2019 ? Cycle  8 FOLFIRI plus Avastin 07/09/2019 ? Cycle 9 FOLFIRI plus Avastin 07/25/2019 ? Cycle 10 FOLFIRI plus Avastin 08/09/2019 ? Cycle 11 FOLFIRI plus Avastin 08/22/2019 ? CTs 09/07/2019-most pulmonary nodules are stable. 1 nodule right lower lobe slightly larger. No new lung lesions. ? Cycle 12 FOLFIRI plus Avastin 09/13/2019 ? Cycle 13 FOLFIRI plus Avastin 10/04/2019 ? Cycle 14 FOLFIRI plus Avastin 10/25/2019 ? Cycle 15 FOLFIRI plus Avastin 11/14/2019 ? Cycle 16 FOLFIRI plus Avastin 12/06/2019 ? CTs neck, chest, abdomen, pelvis 12/21/2019-neck negative; mildly progressive pulmonary metastases; abdomen and pelvis negative for evidence of metastatic disease. ? Cycle 1 FOLFOX/Avastin 12/27/2019 ? Cycle 2 FOLFOX/Avastin 01/17/2020 ? Cycle 3 FOLFOX/Avastin 02/07/2020 ? Cycle 4 FOLFOX 02/28/2020, Avastin held due to need for dental evaluation possible extractions ? Cycle 5 FOLFOX 03/20/2020, Avastin held pending dental evaluation ? CTs 04/04/2020-increased size of pulmonary nodules, no evidence of metastatic disease in the abdomen or pelvis ? Cycle 6 FOLFOX 05/22/2020, Avastin held secondary to dental extractions ? Cycle 7 FOLFOX/Avastin 06/12/2020 ? Cycle 8 FOLFOX/Avastin 06/30/2020 ? Cycle 9 FOLFOX/Avastin 07/24/2020 ? Cycle 10 FOLFOX/Avastin 08/14/2020 ? CTs 09/01/2020-improvement in bilateral pulmonary metastases. ? Cycle 11 FOLFOX/Avastin 09/04/2020 ? Cycle 12 FOLFOX/Avastin 09/25/2020 ? Cycle 13 FOLFOX/Avastin 10/15/2020 ? Cycle 14 FOLFOX/Avastin 11/05/2020 ? 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 Imodium as needed. 6.Mucositis secondary to Xeloda. Improved 07/19/2018. 7. History of migraines 8.  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  Admission 11/18/2020 with nausea/vomiting, fever, and abdominal pain-CT consistent with enteritis/colitis       Ms. Antkowiak was admitted yesterday with a fever and abdominal pain.  She had intermittent diarrhea following the most recent cycle of chemotherapy.  She appears significantly improved today.  The CT yesterday shows no clear evidence of progressive cancer, but there is CT evidence of colitis/enteritis.  I reviewed the CT images.  It is possible she has chemotherapy-induced enteritis, though it would be unusual for this to cause a fever.  She has completed multiple cycles of the same chemotherapy without enteritis in the past.  I remain suspicious of an infection.  Recommendations: 1.  Continue intravenous hydration and IV antibiotics 2.  Follow-up cultures 3.  Advance diet as tolerated 4.  Follow-up as scheduled at the Cancer center 11/26/2020.    LOS: 1 day   Betsy Coder, MD   11/19/2020, 1:28 PM

## 2020-11-19 NOTE — Progress Notes (Signed)
PROGRESS NOTE    Tina Morales  DUK:[id number] DOB: [birth year] DOA: 11/18/2020 PCP: Medicine, [facility]    Brief Narrative:  Tina Morales was admitted to the hospital with the working diagnosis of colitis.  55 year old female past medical history for stage IV colorectal cancer currently on chemotherapy by Dr. Benay Spice.  Last treatment about 2 weeks prior to hospitalization.  She reported 1 week of colicky abdominal pain associate with nausea, and vomiting.  Positive fever.  On her initial physical examination blood pressure 152/102, heart rate 116, respirate 21, temperature 98.9, oxygen saturation 97%, her lungs are clear to auscultation bilaterally, heart S1-S2, present, rhythmic, abdomen soft, diffuse tenderness, no rebound, positive colostomy left lower quadrant.  No lower extremity edema  Sodium 134, potassium 3.3, chloride 95, bicarb 26, glucose 117, BUN 10, creatinine 0.43, white count 15.2, hemoglobin 12.2, hematocrit 39.1, platelets 165.  CT chest with stable pulmonary nodules/metastases,. CT abdomen with submucosal edema in mucosal enhancement involving the distal colon leading up to colostomy as well as loops of small bowel in the pelvis, consistent with colitis and enteritis.  No high-grade obstruction.  Positive peritoneal nodular enhancement.  Assessment & Plan:   Principal Problem:   Colitis Active Problems:   Port-A-Cath in place   Leukocytosis   Colorectal cancer ([place])   Hypokalemia   Elevated blood pressure reading without diagnosis of hypertension   1.  Acute colitis/enteritis. Symptoms slowly improving but not yet back to baseline, no nausea or vomiting. Wbc is 10,3   Plan to advance diet to soft and continue broad spectrum antibiotic therapy with IV Zosyn.  Supportive medical therapy with IV fluids, as needed antiemetics and antiacids.  Out of bed to chair tid with meals. PT and OT evaluation.   2.  Hypokalemia. Renal function stable with serum  cr at 0.35 K is 3,8 and serum bicarbonate is 25.  Continue close follow up on renal function and electrolytes. Continue hydration with IV fluids.   3.  History of colon cancer. Sp stage IV status colostomy.  Currently under treatment with chemotherapy.   4.  Hypertension. Stable blood, 145/94 mmHg.   5. Anxiety. Continue with alprazolam as needed.    Patient continue to be at high risk for worsening colitis.   Status is: Inpatient  Remains inpatient appropriate because:IV treatments appropriate due to intensity of illness or inability to take PO   Dispo: The patient is from: Home              Anticipated d/c is to: Home              Patient currently is not medically stable to d/c.   Difficult to place patient No    DVT prophylaxis: Enoxaparin  Code Status:   Full  Family Communication:  I spoke with patient's husband at the bedside, we talked in detail about patient's condition, plan of care and prognosis and all questions were addressed.     Nutrition Status: Nutrition Problem: Increased nutrient needs Etiology: cancer and cancer related treatments Signs/Symptoms: estimated needs Interventions: Boost Breeze,Ensure Enlive (each supplement provides 350kcal and 20 grams of protein),Education       Antimicrobials:   Zosyn    Subjective: Patient is feeling better but not yet back to baseline, continue to have abdominal pain, no nausea or vomiting, positive bowel movements.   Objective: Vitals:   11/19/20 0045 11/19/20 0512 11/19/20 0834 11/19/20 1230  BP: (!) 153/102 (!) 142/100 (!) 147/97 (!) 145/94  Pulse: Marland Kitchen)  110 (!) 110 (!) 108 (!) 102  Resp: 20 18 16 16   Temp: 98.4 F (36.9 C) 98.7 F (37.1 C) 97.8 F (36.6 C) 98.1 F (36.7 C)  TempSrc: Oral Oral Oral Oral  SpO2: 98% 97% 99% 97%  Weight:      Height:        Intake/Output Summary (Last 24 hours) at 11/19/2020 1316 Last data filed at 11/19/2020 0900 Gross per 24 hour  Intake 2195.47 ml  Output --   Net 2195.47 ml   Filed Weights   11/18/20 2045 11/18/20 2245  Weight: 59.1 kg 59.1 kg    Examination:   General: Not in pain or dyspnea, deconditioned  Neurology: Awake and alert, non focal  E ENT: mild pallor, no icterus, oral mucosa moist Cardiovascular: No JVD. S1-S2 present, rhythmic, no gallops, rubs, or murmurs. No lower extremity edema. Pulmonary: positive breath sounds bilaterally, adequate air movement, no wheezing, rhonchi or rales. Gastrointestinal. Abdomen soft and non tender Skin. No rashes Musculoskeletal: no joint deformities     Data Reviewed: I have personally reviewed following labs and imaging studies  CBC: Recent Labs  Lab 11/18/20 1346 11/19/20 0018  WBC 15.2* 10.3  NEUTROABS 13.6*  --   HGB 12.2 9.3*  HCT 39.1 29.5*  MCV 67.4* 68.6*  PLT 165 700   Basic Metabolic Panel: Recent Labs  Lab 11/18/20 1346 11/19/20 0018  NA 134* 134*  K 3.3* 3.8  CL 95* 103  CO2 26 25  GLUCOSE 117* 93  BUN 10 7  CREATININE 0.43* 0.35*  CALCIUM 9.0 7.9*  MG  --  1.9   GFR: Estimated Creatinine Clearance: 76.7 mL/min (A) (by C-G formula based on SCr of 0.35 mg/dL (L)). Liver Function Tests: Recent Labs  Lab 11/18/20 1346  AST 23  ALT 14  ALKPHOS 244*  BILITOT 1.0  PROT 6.2*  ALBUMIN 3.7   Recent Labs  Lab 11/18/20 1346  LIPASE 34   No results for input(s): AMMONIA in the last 168 hours. Coagulation Profile: No results for input(s): INR, PROTIME in the last 168 hours. Cardiac Enzymes: No results for input(s): CKTOTAL, CKMB, CKMBINDEX, TROPONINI in the last 168 hours. BNP (last 3 results) No results for input(s): PROBNP in the last 8760 hours. HbA1C: No results for input(s): HGBA1C in the last 72 hours. CBG: No results for input(s): GLUCAP in the last 168 hours. Lipid Profile: No results for input(s): CHOL, HDL, LDLCALC, TRIG, CHOLHDL, LDLDIRECT in the last 72 hours. Thyroid Function Tests: No results for input(s): TSH, T4TOTAL, FREET4,  T3FREE, THYROIDAB in the last 72 hours. Anemia Panel: No results for input(s): VITAMINB12, FOLATE, FERRITIN, TIBC, IRON, RETICCTPCT in the last 72 hours.    Radiology Studies: I have reviewed all of the imaging during this hospital visit personally     Scheduled Meds: . ALPRAZolam  0.5 mg Oral QHS  . Chlorhexidine Gluconate Cloth  6 each Topical Daily  . enoxaparin (LOVENOX) injection  40 mg Subcutaneous QHS  . feeding supplement  1 Container Oral TID BM  . pantoprazole  40 mg Oral Daily  . sodium chloride flush  10-40 mL Intracatheter Q12H   Continuous Infusions: . sodium chloride 250 mL (11/18/20 1743)  . sodium chloride 250 mL (11/18/20 1740)  . sodium chloride 100 mL/hr at 11/18/20 2228  . piperacillin-tazobactam (ZOSYN)  IV 3.375 g (11/19/20 0830)     LOS: 1 day        Barry Faircloth Gerome Apley, MD

## 2020-11-20 DIAGNOSIS — K529 Noninfective gastroenteritis and colitis, unspecified: Secondary | ICD-10-CM

## 2020-11-20 LAB — BASIC METABOLIC PANEL
Anion gap: 4 — ABNORMAL LOW (ref 5–15)
BUN: <5 mg/dL — ABNORMAL LOW (ref 6–20)
CO2: 29 mmol/L (ref 22–32)
Calcium: 8.2 mg/dL — ABNORMAL LOW (ref 8.9–10.3)
Chloride: 100 mmol/L (ref 98–111)
Creatinine, Ser: 0.37 mg/dL — ABNORMAL LOW (ref 0.44–1.00)
GFR, Estimated: >60 mL/min (ref >60)
Glucose, Bld: 95 mg/dL (ref 70–99)
Potassium: 3.6 mmol/L (ref 3.5–5.1)
Sodium: 133 mmol/L — ABNORMAL LOW (ref 135–145)

## 2020-11-20 LAB — CBC WITH DIFFERENTIAL/PLATELET
Abs Immature Granulocytes: 0.03 10*3/uL (ref 0.00–0.07)
Basophils Absolute: 0 10*3/uL (ref 0.0–0.1)
Basophils Relative: 0 %
Eosinophils Absolute: 0 10*3/uL (ref 0.0–0.5)
Eosinophils Relative: 0 %
HCT: 29.7 % — ABNORMAL LOW (ref 36.0–46.0)
Hemoglobin: 9.3 g/dL — ABNORMAL LOW (ref 12.0–15.0)
Immature Granulocytes: 0 %
Lymphocytes Relative: 11 %
Lymphs Abs: 0.7 10*3/uL (ref 0.7–4.0)
MCH: 21.4 pg — ABNORMAL LOW (ref 26.0–34.0)
MCHC: 31.3 g/dL (ref 30.0–36.0)
MCV: 68.3 fL — ABNORMAL LOW (ref 80.0–100.0)
Monocytes Absolute: 0.8 10*3/uL (ref 0.1–1.0)
Monocytes Relative: 12 %
Neutro Abs: 5.2 10*3/uL (ref 1.7–7.7)
Neutrophils Relative %: 77 %
Platelets: 178 10*3/uL (ref 150–400)
RBC: 4.35 MIL/uL (ref 3.87–5.11)
RDW: 17.9 % — ABNORMAL HIGH (ref 11.5–15.5)
WBC: 6.8 10*3/uL (ref 4.0–10.5)
nRBC: 0 % (ref 0.0–0.2)

## 2020-11-20 LAB — URINE CULTURE: Culture: NO GROWTH

## 2020-11-20 MED ORDER — AMOXICILLIN-POT CLAVULANATE 500-125 MG PO TABS: 1.0000 | ORAL_TABLET | Freq: Three times a day (TID) | ORAL | 0 refills | Status: DC

## 2020-11-20 MED ORDER — HEPARIN SOD (PORK) LOCK FLUSH 100 UNIT/ML IV SOLN
500.0000 [IU] | Freq: Once | INTRAVENOUS | Status: AC
  Administered 2020-11-20: 500 [IU] via INTRAVENOUS
  Filled 2020-11-20: qty 5

## 2020-11-20 NOTE — Discharge Instructions (Signed)
Advised to follow-up with primary care physician in 1 week.   Advised to follow-up with Dr. Chauncy Passy oncologist as scheduled.   Advised to take Augmentin 500 mg 3 times daily for enteritis for next 5 days.

## 2020-11-20 NOTE — Progress Notes (Signed)
IP PROGRESS NOTE  Subjective:   Tina Morales no longer has nausea.  She has small volume loose stool.  She is tolerating a diet.  She wants to go home.  Objective: Vital signs in last 24 hours: Blood pressure (!) 125/92, pulse (!) 105, temperature 98.9 F (37.2 C), temperature source Oral, resp. rate 18, height 5' 8"  (1.727 m), weight 130 lb 4.7 oz (59.1 kg), SpO2 95 %.  Intake/Output from previous day: 05/11 0701 - 05/12 0700 In: 730 [P.O.:720; I.V.:10] Out: 154 [Stool:154]  Physical Exam:  HEENT: No thrush or ulcers.  Fading erythema at the posterior palate and buccal mucosa.  No thrush  Abdomen: Mild diffuse tenderness, soft, left lower quadrant colostomy with a small amount of light brown liquid stool Extremities: No leg edema   Portacath/PICC-without erythema  Lab Results: Recent Labs    11/19/20 0018 11/20/20 0423  WBC 10.3 6.8  HGB 9.3* 9.3*  HCT 29.5* 29.7*  PLT 157 178    BMET Recent Labs    11/19/20 0018 11/20/20 0423  NA 134* 133*  K 3.8 3.6  CL 103 100  CO2 25 29  GLUCOSE 93 95  BUN 7 <5*  CREATININE 0.35* 0.37*  CALCIUM 7.9* 8.2*    Lab Results  Component Value Date   CEA1 6.57 (H) 11/05/2020    Studies/Results: CT CHEST W CONTRAST  Result Date: 11/18/2020 CLINICAL DATA:  Abdominal pain for 1 week. History of colon cancer. On chemotherapy. Prior colostomy. EXAM: CT CHEST, ABDOMEN, AND PELVIS WITH CONTRAST TECHNIQUE: Multidetector CT imaging of the chest, abdomen and pelvis was performed following the standard protocol during bolus administration of intravenous contrast. CONTRAST:  70m OMNIPAQUE IOHEXOL 300 MG/ML  SOLN COMPARISON:  CT 09/01/2020 FINDINGS: CT CHEST FINDINGS Cardiovascular: Port in the anterior chest wall with tip in distal SVC. Coronary artery calcification and aortic atherosclerotic calcification. Mediastinum/Nodes: No axillary or supraclavicular adenopathy. No mediastinal or hilar adenopathy. No pericardial fluid. Esophagus  normal. Lungs/Pleura: Bilateral pulmonary nodules again demonstrated. Nodules appear very similar to CT 09/01/2020. Example nodule in the superior segment of the RIGHT lower lobe measures 9 mm (image 55) compared to 10 mm on prior. Nodule in the RIGHT upper lobe measures 9 mm (image 57) compared with 9 mm. Nodule in the lateral aspect of the LEFT lower lobe measures 6 mm (image 85) compared with 5 mm. Peripheral LEFT upper lobe nodule measures 5 mm (image 67) compared with 6 mm. No new nodules are identified. Musculoskeletal: No aggressive osseous lesion. CT ABDOMEN AND PELVIS FINDINGS Hepatobiliary: No focal hepatic lesion. Small amount pericholecystic fluid is new from prior (image 16/series 2). No intrahepatic duct dilatation. Common bile duct normal caliber. Pancreas: Pancreas is normal. No ductal dilatation. No pancreatic inflammation. Spleen: Spleen normal Adrenals/urinary tract: Adrenal glands and kidneys are normal. The ureters and bladder normal. Stomach/Bowel: Stomach and duodenum normal. There is mild submucosal edema within the loops of the small bowel deep within the pelvis (image 107/series 602). The small amount of fluid within the pelvis which is similar prior. There is fluid stool throughout the colon. The distal colon leading up to the ostomy demonstrates sub mucosal edema and mucosal enhancement (image 90/62 for example). There is mucosal enhancement through the colon exiting the ostomy. No evidence of local recurrence in the proctectomy site. There is no evidence of bowel obstruction. Vascular/Lymphatic: Abdominal aorta is normal caliber with atherosclerotic calcification. There is no retroperitoneal or periportal lymphadenopathy. No pelvic lymphadenopathy. Reproductive: Post hysterectomy.  Adnexa unremarkable  Other: Along the posterior margin of the RIGHT hepatic lobe there is peritoneal thickening with potential enhancement. Small amount fluid at this site on comparison exam however the  enhancement is new (image 63/62). Nodular enhancement measures 10 mm x 7 mm image 64/602). Nodularity also seen on coronal image 24/605) Musculoskeletal: No aggressive osseous lesion. IMPRESSION: Chest Impression: 1. Stable bilateral pulmonary nodular metastasis. 2. No mediastinal lymphadenopathy. Abdomen / Pelvis Impression: 1. Submucosal edema and mucosal enhancement involving the distal colon leading up to the colostomy as well as loops of small bowel in the pelvis. Findings are most consistent with colitis and enteritis. No high-grade obstruction. a 2. Concern for subtle peritoneal nodular enhancement along the posterior aspect the RIGHT hepatic lobe. Recommend close attention on follow-up with CT scan or FDG PET scan. These results will be called to the ordering clinician or representative by the Radiologist Assistant, and communication documented in the PACS or [employer]. Electronically Signed   By: Suzy Bouchard M.D.   On: 11/18/2020 16:42   CT Abdomen Pelvis W Contrast  Result Date: 11/18/2020 CLINICAL DATA:  Abdominal pain for 1 week. History of colon cancer. On chemotherapy. Prior colostomy. EXAM: CT CHEST, ABDOMEN, AND PELVIS WITH CONTRAST TECHNIQUE: Multidetector CT imaging of the chest, abdomen and pelvis was performed following the standard protocol during bolus administration of intravenous contrast. CONTRAST:  36m OMNIPAQUE IOHEXOL 300 MG/ML  SOLN COMPARISON:  CT 09/01/2020 FINDINGS: CT CHEST FINDINGS Cardiovascular: Port in the anterior chest wall with tip in distal SVC. Coronary artery calcification and aortic atherosclerotic calcification. Mediastinum/Nodes: No axillary or supraclavicular adenopathy. No mediastinal or hilar adenopathy. No pericardial fluid. Esophagus normal. Lungs/Pleura: Bilateral pulmonary nodules again demonstrated. Nodules appear very similar to CT 09/01/2020. Example nodule in the superior segment of the RIGHT lower lobe measures 9 mm (image 55) compared to 10  mm on prior. Nodule in the RIGHT upper lobe measures 9 mm (image 57) compared with 9 mm. Nodule in the lateral aspect of the LEFT lower lobe measures 6 mm (image 85) compared with 5 mm. Peripheral LEFT upper lobe nodule measures 5 mm (image 67) compared with 6 mm. No new nodules are identified. Musculoskeletal: No aggressive osseous lesion. CT ABDOMEN AND PELVIS FINDINGS Hepatobiliary: No focal hepatic lesion. Small amount pericholecystic fluid is new from prior (image 16/series 2). No intrahepatic duct dilatation. Common bile duct normal caliber. Pancreas: Pancreas is normal. No ductal dilatation. No pancreatic inflammation. Spleen: Spleen normal Adrenals/urinary tract: Adrenal glands and kidneys are normal. The ureters and bladder normal. Stomach/Bowel: Stomach and duodenum normal. There is mild submucosal edema within the loops of the small bowel deep within the pelvis (image 107/series 602). The small amount of fluid within the pelvis which is similar prior. There is fluid stool throughout the colon. The distal colon leading up to the ostomy demonstrates sub mucosal edema and mucosal enhancement (image 90/62 for example). There is mucosal enhancement through the colon exiting the ostomy. No evidence of local recurrence in the proctectomy site. There is no evidence of bowel obstruction. Vascular/Lymphatic: Abdominal aorta is normal caliber with atherosclerotic calcification. There is no retroperitoneal or periportal lymphadenopathy. No pelvic lymphadenopathy. Reproductive: Post hysterectomy.  Adnexa unremarkable Other: Along the posterior margin of the RIGHT hepatic lobe there is peritoneal thickening with potential enhancement. Small amount fluid at this site on comparison exam however the enhancement is new (image 63/62). Nodular enhancement measures 10 mm x 7 mm image 64/602). Nodularity also seen on coronal image 24/605) Musculoskeletal: No aggressive  osseous lesion. IMPRESSION: Chest Impression: 1. Stable  bilateral pulmonary nodular metastasis. 2. No mediastinal lymphadenopathy. Abdomen / Pelvis Impression: 1. Submucosal edema and mucosal enhancement involving the distal colon leading up to the colostomy as well as loops of small bowel in the pelvis. Findings are most consistent with colitis and enteritis. No high-grade obstruction. a 2. Concern for subtle peritoneal nodular enhancement along the posterior aspect the RIGHT hepatic lobe. Recommend close attention on follow-up with CT scan or FDG PET scan. These results will be called to the ordering clinician or representative by the Radiologist Assistant, and communication documented in the PACS or [employer]. Electronically Signed   By: Suzy Bouchard M.D.   On: 11/18/2020 16:42   DG Chest Port 1 View  Result Date: 11/18/2020 CLINICAL DATA:  Abdominal pain and vomiting for 1 week. History of colon cancer. EXAM: PORTABLE CHEST 1 VIEW COMPARISON:  CT chest, abdomen and pelvis 09/01/2020. FINDINGS: Right IJ approach Port-A-Cath is in place. The lungs are emphysematous. A few scattered pulmonary nodules consistent with known metastatic disease are seen. No consolidative process, pneumothorax or effusion. Heart size is normal. Aortic atherosclerosis. IMPRESSION: No acute disease. Pulmonary nodules consistent with known metastatic disease. Aortic Atherosclerosis (ICD10-I70.0) and Emphysema (ICD10-J43.9). Electronically Signed   By: Inge Rise M.D.   On: 11/18/2020 14:02    Medications: I have reviewed the patient's current medications.  Assessment/Plan: 1. Rectal cancer-rectal mass noted on digital exam 02/10/2018, colonoscopy confirmed a him my circumferential mass in the rectum ? 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 CTs 02/10/2018-anterior rectal wall thickening, pulmonary nodules measuring up to 9 mm concerning for metastases, few round perirectal lymph nodes ? Pelvic MRI 02/20/2018-hypermetabolic low rectal mass extending to the posterior vagina with 2 small enlarged perirectal lymph nodes, T4b,N1-2.3 cm from the anal verge ? PET scan 02/23/2018-hypermetabolic rectal mass, 8 mm hypermetabolic lingular nodule, scattered small bilateral lung nodules, some calcified, a few with mildly increased activity ? Cycle 1 FOLFOXIRI8/21/2019 ? Cycle 5 FOLFOXIRI10/16/2019 ? CTs 05/01/2018-significant interval response to therapy with decreased size of the primary rectal mass lesion. Decreasing perirectal lymphadenopathy. Decreased and/or resolved pulmonary nodules. No new sites of disease identified. ? Cycle 6 FOLFOXIRI10/31/2019 ? Radiation/Xeloda 06/12/2018-completed 07/21/2018 ? Xeloda dose reduced 07/10/2018 due to mucositis and diarrhea ? CTs 08/07/2018-compared to 02/10/2018-resolved and decreased pulmonary nodules, few tiny residual noncalcified nodules, decreased 20 perirectal lymph nodes, soft tissue of the rectum indistinguishable from posterior wall of vagina-Vaginal involvement by tumor? ? 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 CT chest 09/29/2018-enlargement of lung nodules, not a candidate for SBRT based on discussion in GI tumor conference and with radiation oncology ? CT chest 11/27/18 - interval growth of numerous pulmonary metastases bilaterally compared to 09/29/18, no new pulmonary mets ? Cycle 1FOLFOXIRI 11/29/18 ? Cycle 2 FOLFOXIRI6/10/2018 ? Cycle 3 FOLFOXIRI 12/28/2018 ? Cycle 4 FOLFIRINOX 01/11/2019 ? CTs 01/24/2019-significant improvement of bilateral  pulmonary metastases. ? Cycle 1 FOLFIRI 02/01/2019 ? Cycle 2 FOLFIRI 02/22/2019 ? Cycle 3 FOLFIRI 03/15/2019 ? Cycle 4 FOLFIRI 04/05/2019 ? Cycle 5 FOLFIRI 04/26/2019 ? CTs 05/14/2019-slight enlargement of right lung nodules, no other evidence of disease progression ? Cycle 6 FOLFIRI 05/17/2019 ?  Cycle 7 FOLFIRI plus Avastin 06/13/2019 ? Cycle 8 FOLFIRI plus Avastin 07/09/2019 ? Cycle 9 FOLFIRI plus Avastin 07/25/2019 ? Cycle 10 FOLFIRI plus Avastin 08/09/2019 ? Cycle 11 FOLFIRI plus Avastin 08/22/2019 ? CTs 09/07/2019-most pulmonary nodules are stable. 1 nodule right lower lobe slightly larger. No new lung lesions. ? Cycle 12 FOLFIRI plus Avastin 09/13/2019 ? Cycle 13 FOLFIRI plus Avastin 10/04/2019 ? Cycle 14 FOLFIRI plus Avastin 10/25/2019 ? Cycle 15 FOLFIRI plus Avastin 11/14/2019 ? Cycle 16 FOLFIRI plus Avastin 12/06/2019 ? CTs neck, chest, abdomen, pelvis 12/21/2019-neck negative; mildly progressive pulmonary metastases; abdomen and pelvis negative for evidence of metastatic disease. ? Cycle 1 FOLFOX/Avastin 12/27/2019 ? Cycle 2 FOLFOX/Avastin 01/17/2020 ? Cycle 3 FOLFOX/Avastin 02/07/2020 ? Cycle 4 FOLFOX 02/28/2020, Avastin held due to need for dental evaluation possible extractions ? Cycle 5 FOLFOX 03/20/2020, Avastin held pending dental evaluation ? CTs 04/04/2020-increased size of pulmonary nodules, no evidence of metastatic disease in the abdomen or pelvis ? Cycle 6 FOLFOX 05/22/2020, Avastin held secondary to dental extractions ? Cycle 7 FOLFOX/Avastin 06/12/2020 ? Cycle 8 FOLFOX/Avastin 06/30/2020 ? Cycle 9 FOLFOX/Avastin 07/24/2020 ? Cycle 10 FOLFOX/Avastin 08/14/2020 ? CTs 09/01/2020-improvement in bilateral pulmonary metastases. ? Cycle 11 FOLFOX/Avastin 09/04/2020 ? Cycle 12 FOLFOX/Avastin 09/25/2020 ? Cycle 13 FOLFOX/Avastin 10/15/2020 ? Cycle 14 FOLFOX/Avastin 11/05/2020 ? 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 Imodium as needed. 6.Mucositis secondary to Xeloda. Improved 07/19/2018. 7. History of migraines 8.  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  Admission 11/18/2020 with nausea/vomiting, fever, and abdominal pain-CT consistent with enteritis/colitis       Tina Morales appears improved compared to the day of admission.  The etiology of her presentation with a fever, abdominal pain, and diarrhea is unclear.  She has minimal stool output at present.  Cultures remain negative.  It is possible her presentation was related to chemotherapy induced enteritis or an infection.  She is tolerating a diet and appears stable for discharge from an oncology standpoint.  She should call for a recurrent high fever or abdominal pain.  Recommendations: 1.  Discharge to home 2.  Follow-up as scheduled at the Cancer center 11/26/2020.    LOS: 2 days   Betsy Coder, MD   11/20/2020, 12:30 PM

## 2020-11-20 NOTE — Progress Notes (Signed)
Patient discharged to home with family, discharge instructions reviewed with patient who verbalized understanding. 

## 2020-11-20 NOTE — Discharge Summary (Signed)
Physician Discharge Summary  Tina Morales [id number] DOB: October 05, 1965 DOA: 11/18/2020  PCP: [facility], [place] date: 11/18/2020   Discharge date: 11/20/2020  Admitted From: Home.  Disposition:  Home  Recommendations for Outpatient Follow-up:  1. Follow up with PCP in 1-2 weeks. 2. Please obtain BMP/CBC in one week. 3. Advised to follow-up with Dr. Learta Codding oncologist as scheduled.   4. Advised to take Augmentin 500 mg 3 times daily for enteritis for next 5 days.  Home Health: None Equipment/Devices: None  Discharge Condition: Stable CODE STATUS:Full code Diet recommendation: Heart Healthy   Brief Summary / This 55 year old female with PMH significant for stage IV colorectal cancer currently on chemotherapy by Dr. Benay Spice.  Last treatment was about 2 weeks prior to hospitalization.  She presented with one week of colicky abdominal pain associated with nausea, vomiting and fever.  She has colostomy bag from prior surgery. CT chest with stable pulmonary nodules/metastases,. CT abdomen with submucosal edema in mucosal enhancement involving the distal colon leading up to colostomy as well as loops of small bowel in the pelvis, consistent with colitis and enteritis.  No high-grade obstruction. Positive peritoneal nodular enhancement.  Hospital course: Patient was admitted for possible enteritis / Colitis in the setting of colorectal carcinoma, Patient symptoms slowly improving,  nausea and vomiting has improved.  She remained afebrile, with no leukocytosis.  Patient's diet was advanced to soft.  She has received IV Zosyn during hospitalization.  Patient reports pain has improved.  Electrolytes improved.  Patient feels better and wants to be discharged home. Case discussed with Dr. Benay Spice oncologist who has agreed with the disposition home.  Patient will follow up with Dr. Benay Spice in 1 week.  She was managed for below problems.  Discharge Diagnoses:  Principal  Problem:   Colitis Active Problems:   Port-A-Cath in place   Leukocytosis   Colorectal cancer ([place])   Hypokalemia   Elevated blood pressure reading without diagnosis of hypertension  Acute colitis/enteritis.  > Improving Symptoms slowly improving  And back to baseline, no nausea or vomiting. Wbc is 10,3   Plan to advance diet to soft and continue broad spectrum antibiotic therapy with IV Zosyn.  Supportive medical therapy with IV fluids, as needed antiemetics and antiacids.  Out of bed to chair tid with meals. PT and OT evaluation.   Hypokalemia.  Renal function stable with serum cr at 0.35 K is 3,8 and serum bicarbonate is 25.  Continue close follow up on renal function and electrolytes. Continue hydration with IV fluids.   History of colon cancer.  Sp stage IV status colostomy.  Currently under treatment with chemotherapy.   Hypertension. Stable blood, 145/94 mmHg.   Anxiety. Continue with alprazolam as needed.    Patient continue to be at high risk for worsening colitis.    Discharge Instructions  Discharge Instructions    Call MD for:  difficulty breathing, headache or visual disturbances   Complete by: As directed    Call MD for:  persistant dizziness or light-headedness   Complete by: As directed    Call MD for:  persistant nausea and vomiting   Complete by: As directed    Diet - low sodium heart healthy   Complete by: As directed    Diet Carb Modified   Complete by: As directed    Discharge instructions   Complete by: As directed    Advised to follow-up with primary care physician in 1 week.   Advised to follow-up  with Dr. Chauncy Passy oncologist as scheduled.   Advised to take Augmentin 500 mg 3 times daily for enteritis for next 5 days.   Increase activity slowly   Complete by: As directed      Allergies as of 11/20/2020      Reactions   Sulfa Antibiotics    Patient unaware of side effects, she was told when she was a child that she was  allergic      Medication List    STOP taking these medications   BC HEADACHE PO   cyclobenzaprine 5 MG tablet Commonly known as: FLEXERIL   magic mouthwash Soln   traMADol 50 MG tablet Commonly known as: ULTRAM     TAKE these medications   acetaminophen 500 MG tablet Commonly known as: TYLENOL Take 1,000 mg by mouth every 8 (eight) hours as needed (PAIN).   ALPRAZolam 0.5 MG tablet Commonly known as: XANAX Take 1 tablet (0.5 mg total) by mouth 2 (two) times daily as needed for anxiety. What changed: when to take this   amoxicillin-clavulanate 500-125 MG tablet Commonly known as: Augmentin Take 1 tablet (500 mg total) by mouth 3 (three) times daily.   bisacodyl 5 MG EC tablet Commonly known as: DULCOLAX Take 15 mg by mouth daily as needed for moderate constipation. Takes for few days after each tx   dexamethasone 4 MG tablet Commonly known as: DECADRON #2 tablets twice daily x 3 days. Start day after chemo treatment   diphenoxylate-atropine 2.5-0.025 MG tablet Commonly known as: LOMOTIL Take 2 tablets by mouth 4 (four) times daily as needed for diarrhea or loose stools.   lidocaine-prilocaine cream Commonly known as: EMLA Apply 1 application topically as needed (port).   loratadine 10 MG tablet Commonly known as: CLARITIN Take 10 mg by mouth daily as needed for allergies.   Magnesium Oxide 250 MG Tabs Take 500 mg by mouth 2 (two) times daily.   MIRALAX PO Take 17 g by mouth daily as needed (constipation).   omeprazole 20 MG capsule Commonly known as: PRILOSEC Take 20 mg by mouth daily as needed (indigestion).   potassium chloride SA 20 MEQ tablet Commonly known as: KLOR-CON Take 1 tablet (20 mEq total) by mouth 2 (two) times daily.   prochlorperazine 10 MG tablet Commonly known as: COMPAZINE Take 1 tablet (10 mg total) by mouth every 6 (six) hours as needed for nausea or vomiting.       Follow-up Information    Medicine, [facility]  Family Follow up in 1 week(s).   Specialty: Family Medicine       Ladell Pier, MD Follow up in 1 week(s).   Specialty: Oncology Contact information: [place] [address] [phone]              Allergies  Allergen Reactions  . Sulfa Antibiotics     Patient unaware of side effects, she was told when she was a child that she was allergic    Consultations: Oncology  Procedures/Studies: CT CHEST W CONTRAST  Result Date: 11/18/2020 CLINICAL DATA:  Abdominal pain for 1 week. History of colon cancer. On chemotherapy. Prior colostomy. EXAM: CT CHEST, ABDOMEN, AND PELVIS WITH CONTRAST TECHNIQUE: Multidetector CT imaging of the chest, abdomen and pelvis was performed following the standard protocol during bolus administration of intravenous contrast. CONTRAST:  22mL OMNIPAQUE IOHEXOL 300 MG/ML  SOLN COMPARISON:  CT 09/01/2020 FINDINGS: CT CHEST FINDINGS Cardiovascular: Port in the anterior chest wall with tip in distal  SVC. Coronary artery calcification and aortic atherosclerotic calcification. Mediastinum/Nodes: No axillary or supraclavicular adenopathy. No mediastinal or hilar adenopathy. No pericardial fluid. Esophagus normal. Lungs/Pleura: Bilateral pulmonary nodules again demonstrated. Nodules appear very similar to CT 09/01/2020. Example nodule in the superior segment of the RIGHT lower lobe measures 9 mm (image 55) compared to 10 mm on prior. Nodule in the RIGHT upper lobe measures 9 mm (image 57) compared with 9 mm. Nodule in the lateral aspect of the LEFT lower lobe measures 6 mm (image 85) compared with 5 mm. Peripheral LEFT upper lobe nodule measures 5 mm (image 67) compared with 6 mm. No new nodules are identified. Musculoskeletal: No aggressive osseous lesion. CT ABDOMEN AND PELVIS FINDINGS Hepatobiliary: No focal hepatic lesion. Small amount pericholecystic fluid is new from prior (image 16/series 2). No intrahepatic duct dilatation. Common bile duct  normal caliber. Pancreas: Pancreas is normal. No ductal dilatation. No pancreatic inflammation. Spleen: Spleen normal Adrenals/urinary tract: Adrenal glands and kidneys are normal. The ureters and bladder normal. Stomach/Bowel: Stomach and duodenum normal. There is mild submucosal edema within the loops of the small bowel deep within the pelvis (image 107/series 602). The small amount of fluid within the pelvis which is similar prior. There is fluid stool throughout the colon. The distal colon leading up to the ostomy demonstrates sub mucosal edema and mucosal enhancement (image 90/62 for example). There is mucosal enhancement through the colon exiting the ostomy. No evidence of local recurrence in the proctectomy site. There is no evidence of bowel obstruction. Vascular/Lymphatic: Abdominal aorta is normal caliber with atherosclerotic calcification. There is no retroperitoneal or periportal lymphadenopathy. No pelvic lymphadenopathy. Reproductive: Post hysterectomy.  Adnexa unremarkable Other: Along the posterior margin of the RIGHT hepatic lobe there is peritoneal thickening with potential enhancement. Small amount fluid at this site on comparison exam however the enhancement is new (image 63/62). Nodular enhancement measures 10 mm x 7 mm image 64/602). Nodularity also seen on coronal image 24/605) Musculoskeletal: No aggressive osseous lesion. IMPRESSION: Chest Impression: 1. Stable bilateral pulmonary nodular metastasis. 2. No mediastinal lymphadenopathy. Abdomen / Pelvis Impression: 1. Submucosal edema and mucosal enhancement involving the distal colon leading up to the colostomy as well as loops of small bowel in the pelvis. Findings are most consistent with colitis and enteritis. No high-grade obstruction. a 2. Concern for subtle peritoneal nodular enhancement along the posterior aspect the RIGHT hepatic lobe. Recommend close attention on follow-up with CT scan or FDG PET scan. These results will be called to  the ordering clinician or representative by the Radiologist Assistant, and communication documented in the PACS or [employer]. Electronically Signed   By: Suzy Bouchard M.D.   On: 11/18/2020 16:42   CT Abdomen Pelvis W Contrast  Result Date: 11/18/2020 CLINICAL DATA:  Abdominal pain for 1 week. History of colon cancer. On chemotherapy. Prior colostomy. EXAM: CT CHEST, ABDOMEN, AND PELVIS WITH CONTRAST TECHNIQUE: Multidetector CT imaging of the chest, abdomen and pelvis was performed following the standard protocol during bolus administration of intravenous contrast. CONTRAST:  45mL OMNIPAQUE IOHEXOL 300 MG/ML  SOLN COMPARISON:  CT 09/01/2020 FINDINGS: CT CHEST FINDINGS Cardiovascular: Port in the anterior chest wall with tip in distal SVC. Coronary artery calcification and aortic atherosclerotic calcification. Mediastinum/Nodes: No axillary or supraclavicular adenopathy. No mediastinal or hilar adenopathy. No pericardial fluid. Esophagus normal. Lungs/Pleura: Bilateral pulmonary nodules again demonstrated. Nodules appear very similar to CT 09/01/2020. Example nodule in the superior segment of the RIGHT lower lobe measures 9 mm (image 55)  compared to 10 mm on prior. Nodule in the RIGHT upper lobe measures 9 mm (image 57) compared with 9 mm. Nodule in the lateral aspect of the LEFT lower lobe measures 6 mm (image 85) compared with 5 mm. Peripheral LEFT upper lobe nodule measures 5 mm (image 67) compared with 6 mm. No new nodules are identified. Musculoskeletal: No aggressive osseous lesion. CT ABDOMEN AND PELVIS FINDINGS Hepatobiliary: No focal hepatic lesion. Small amount pericholecystic fluid is new from prior (image 16/series 2). No intrahepatic duct dilatation. Common bile duct normal caliber. Pancreas: Pancreas is normal. No ductal dilatation. No pancreatic inflammation. Spleen: Spleen normal Adrenals/urinary tract: Adrenal glands and kidneys are normal. The ureters and bladder normal. Stomach/Bowel:  Stomach and duodenum normal. There is mild submucosal edema within the loops of the small bowel deep within the pelvis (image 107/series 602). The small amount of fluid within the pelvis which is similar prior. There is fluid stool throughout the colon. The distal colon leading up to the ostomy demonstrates sub mucosal edema and mucosal enhancement (image 90/62 for example). There is mucosal enhancement through the colon exiting the ostomy. No evidence of local recurrence in the proctectomy site. There is no evidence of bowel obstruction. Vascular/Lymphatic: Abdominal aorta is normal caliber with atherosclerotic calcification. There is no retroperitoneal or periportal lymphadenopathy. No pelvic lymphadenopathy. Reproductive: Post hysterectomy.  Adnexa unremarkable Other: Along the posterior margin of the RIGHT hepatic lobe there is peritoneal thickening with potential enhancement. Small amount fluid at this site on comparison exam however the enhancement is new (image 63/62). Nodular enhancement measures 10 mm x 7 mm image 64/602). Nodularity also seen on coronal image 24/605) Musculoskeletal: No aggressive osseous lesion. IMPRESSION: Chest Impression: 1. Stable bilateral pulmonary nodular metastasis. 2. No mediastinal lymphadenopathy. Abdomen / Pelvis Impression: 1. Submucosal edema and mucosal enhancement involving the distal colon leading up to the colostomy as well as loops of small bowel in the pelvis. Findings are most consistent with colitis and enteritis. No high-grade obstruction. a 2. Concern for subtle peritoneal nodular enhancement along the posterior aspect the RIGHT hepatic lobe. Recommend close attention on follow-up with CT scan or FDG PET scan. These results will be called to the ordering clinician or representative by the Radiologist Assistant, and communication documented in the PACS or [employer]. Electronically Signed   By: Suzy Bouchard M.D.   On: 11/18/2020 16:42   DG Chest Port 1  View  Result Date: 11/18/2020 CLINICAL DATA:  Abdominal pain and vomiting for 1 week. History of colon cancer. EXAM: PORTABLE CHEST 1 VIEW COMPARISON:  CT chest, abdomen and pelvis 09/01/2020. FINDINGS: Right IJ approach Port-A-Cath is in place. The lungs are emphysematous. A few scattered pulmonary nodules consistent with known metastatic disease are seen. No consolidative process, pneumothorax or effusion. Heart size is normal. Aortic atherosclerosis. IMPRESSION: No acute disease. Pulmonary nodules consistent with known metastatic disease. Aortic Atherosclerosis (ICD10-I70.0) and Emphysema (ICD10-J43.9). Electronically Signed   By: Inge Rise M.D.   On: 11/18/2020 14:02     Subjective: Patient was seen and examined at bedside.  Overnight events noted.  Patient reports feeling better,Patient wants to be discharged home.  She reports having much improvement in bowel movements  Discharge Exam: Vitals:   11/19/20 2358 11/20/20 0501  BP: 115/80 (!) 125/92  Pulse: 96 (!) 105  Resp: 18 18  Temp: 98.6 F (37 C) 98.9 F (37.2 C)  SpO2: 98% 95%   Vitals:   11/19/20 1507 11/19/20 1930 11/19/20 2358 11/20/20 0501  BP: (!) 142/95  115/80 (!) 125/92  Pulse: (!) 101  96 (!) 105  Resp:   18 18  Temp: 98.5 F (36.9 C) 99.5 F (37.5 C) 98.6 F (37 C) 98.9 F (37.2 C)  TempSrc: Oral Oral Oral Oral  SpO2:   98% 95%  Weight:      Height:        General: Pt is alert, awake, not in acute distress Cardiovascular: RRR, S1/S2 +, no rubs, no gallops Respiratory: CTA bilaterally, no wheezing, no rhonchi Abdominal: Soft, NT, ND, bowel sounds + Extremities: no edema, no cyanosis    The results of significant diagnostics from this hospitalization (including imaging, microbiology, ancillary and laboratory) are listed below for reference.     Microbiology: Recent Results (from the past 240 hour(s))  Culture, blood (routine x 2)     Status: None (Preliminary result)   Collection Time:  11/18/20  1:23 PM   Specimen: Porta Cath; Blood  Result Value Ref Range Status   Specimen Description   Final    PORTA CATH Performed at [facility], [address], [place], [place] [address]    Special Requests   Final    Blood Culture adequate volume BOTTLES DRAWN AEROBIC AND ANAEROBIC   Culture   Final    NO GROWTH 2 DAYS Performed at [facility] [address], [place], [place] [address]    Report Status PENDING  Incomplete  Culture, blood (routine x 2)     Status: None (Preliminary result)   Collection Time: 11/18/20  1:28 PM   Specimen: BLOOD LEFT ARM  Result Value Ref Range Status   Specimen Description   Final    BLOOD LEFT ARM Performed at [facility], [address], [place], [place] [address]    Special Requests   Final    Blood Culture results may not be optimal due to an inadequate volume of blood received in culture bottles BOTTLES DRAWN AEROBIC AND ANAEROBIC   Culture   Final    NO GROWTH 2 DAYS Performed at [facility] [address], [place], [place] [address]    Report Status PENDING  Incomplete  Urine culture     Status: None   Collection Time: 11/18/20  1:46 PM   Specimen: Urine, Clean Catch  Result Value Ref Range Status   Specimen Description   Final    URINE, CLEAN CATCH Performed at [facility], [address], [place], [place] [address]    Special Requests   Final    NONE Performed at [facility], [address] [place], [place] [address]    Culture   Final    NO GROWTH Performed at [facility], [place] [address] [place], [place] [address]    Report Status 11/20/2020 FINAL  Final  Resp Panel by RT-PCR (Flu A&B, Covid) Nasopharyngeal Swab     Status: None   Collection Time: 11/18/20  1:46 PM   Specimen: Nasopharyngeal Swab; Nasopharyngeal(NP) swabs in vial transport medium  Result Value Ref Range Status   SARS Coronavirus 2 by RT PCR  NEGATIVE NEGATIVE Final    Comment: (NOTE) SARS-CoV-2 target nucleic acids are NOT DETECTED.  The SARS-CoV-2 RNA is generally detectable in upper respiratory specimens during the acute phase of infection. The lowest concentration of SARS-CoV-2 viral copies this assay can detect is 138 copies/mL. A negative result does not preclude SARS-Cov-2 infection and should not be used as the sole  basis for treatment or other patient management decisions. A negative result may occur with  improper specimen collection/handling, submission of specimen other than nasopharyngeal swab, presence of viral mutation(s) within the areas targeted by this assay, and inadequate number of viral copies(<138 copies/mL). A negative result must be combined with clinical observations, patient history, and epidemiological information. The expected result is Negative.  Fact Sheet for Patients:  EntrepreneurPulse.com.au  Fact Sheet for Healthcare Providers:  IncredibleEmployment.be  This test is no t yet approved or cleared by the [place] FDA and  has been authorized for detection and/or diagnosis of SARS-CoV-2 by FDA under an Emergency Use Authorization (EUA). This EUA will remain  in effect (meaning this test can be used) for the duration of the COVID-19 declaration under Section 564(b)(1) of the Act, 21 U.S.C.section 360bbb-3(b)(1), unless the authorization is terminated  or revoked sooner.       Influenza A by PCR NEGATIVE NEGATIVE Final   Influenza B by PCR NEGATIVE NEGATIVE Final    Comment: (NOTE) The Xpert Xpress SARS-CoV-2/FLU/RSV plus assay is intended as an aid in the diagnosis of influenza from Nasopharyngeal swab specimens and should not be used as a sole basis for treatment. Nasal washings and aspirates are unacceptable for Xpert Xpress SARS-CoV-2/FLU/RSV testing.  Fact Sheet for Patients: EntrepreneurPulse.com.au  Fact Sheet for  Healthcare Providers: IncredibleEmployment.be  This test is not yet approved or cleared by the [place] FDA and has been authorized for detection and/or diagnosis of SARS-CoV-2 by FDA under an Emergency Use Authorization (EUA). This EUA will remain in effect (meaning this test can be used) for the duration of the COVID-19 declaration under Section 564(b)(1) of the Act, 21 U.S.C. section 360bbb-3(b)(1), unless the authorization is terminated or revoked.  Performed at [facility], [address] [place], [place] [address]      Labs: BNP (last 3 results) No results for input(s): BNP in the last 8760 hours. Basic Metabolic Panel: Recent Labs  Lab 11/18/20 1346 11/19/20 0018 11/20/20 0423  NA 134* 134* 133*  K 3.3* 3.8 3.6  CL 95* 103 100  CO2 26 25 29   GLUCOSE 117* 93 95  BUN 10 7 <5*  CREATININE 0.43* 0.35* 0.37*  CALCIUM 9.0 7.9* 8.2*  MG  --  1.9  --    Liver Function Tests: Recent Labs  Lab 11/18/20 1346  AST 23  ALT 14  ALKPHOS 244*  BILITOT 1.0  PROT 6.2*  ALBUMIN 3.7   Recent Labs  Lab 11/18/20 1346  LIPASE 34   No results for input(s): AMMONIA in the last 168 hours. CBC: Recent Labs  Lab 11/18/20 1346 11/19/20 0018 11/20/20 0423  WBC 15.2* 10.3 6.8  NEUTROABS 13.6*  --  5.2  HGB 12.2 9.3* 9.3*  HCT 39.1 29.5* 29.7*  MCV 67.4* 68.6* 68.3*  PLT 165 157 178   Cardiac Enzymes: No results for input(s): CKTOTAL, CKMB, CKMBINDEX, TROPONINI in the last 168 hours. BNP: Invalid input(s): POCBNP CBG: No results for input(s): GLUCAP in the last 168 hours. D-Dimer No results for input(s): DDIMER in the last 72 hours. Hgb A1c No results for input(s): HGBA1C in the last 72 hours. Lipid Profile No results for input(s): CHOL, HDL, LDLCALC, TRIG, CHOLHDL, LDLDIRECT in the last 72 hours. Thyroid function studies No results for input(s): TSH, T4TOTAL, T3FREE, THYROIDAB in the last 72 hours.  Invalid input(s):  FREET3 Anemia work up No results for input(s): VITAMINB12, FOLATE, FERRITIN, TIBC, IRON, RETICCTPCT in the last 48  hours. Urinalysis    Component Value Date/Time   COLORURINE YELLOW 11/18/2020 1346   APPEARANCEUR CLEAR 11/18/2020 1346   LABSPEC 1.022 11/18/2020 1346   PHURINE 6.0 11/18/2020 1346   GLUCOSEU NEGATIVE 11/18/2020 1346   HGBUR NEGATIVE 11/18/2020 [place] 11/18/2020 1346   [place] 11/18/2020 1346   PROTEINUR NEGATIVE 11/18/2020 1346   NITRITE NEGATIVE 11/18/2020 1346   LEUKOCYTESUR TRACE (A) 11/18/2020 1346   Sepsis Labs Invalid input(s): PROCALCITONIN,  WBC,  LACTICIDVEN Microbiology Recent Results (from the past 240 hour(s))  Culture, blood (routine x 2)     Status: None (Preliminary result)   Collection Time: 11/18/20  1:23 PM   Specimen: Porta Cath; Blood  Result Value Ref Range Status   Specimen Description   Final    PORTA CATH Performed at [facility], [address], [place], [place] [address]    Special Requests   Final    Blood Culture adequate volume BOTTLES DRAWN AEROBIC AND ANAEROBIC   Culture   Final    NO GROWTH 2 DAYS Performed at [facility], [place] [address] [place], [place] [address]    Report Status PENDING  Incomplete  Culture, blood (routine x 2)     Status: None (Preliminary result)   Collection Time: 11/18/20  1:28 PM   Specimen: BLOOD LEFT ARM  Result Value Ref Range Status   Specimen Description   Final    BLOOD LEFT ARM Performed at [facility], [address] [place], [place] [address]    Special Requests   Final    Blood Culture results may not be optimal due to an inadequate volume of blood received in culture bottles BOTTLES DRAWN AEROBIC AND ANAEROBIC   Culture   Final    NO GROWTH 2 DAYS Performed at [facility] [address] [place], [place] [address]    Report Status PENDING  Incomplete  Urine culture     Status: None   Collection  Time: 11/18/20  1:46 PM   Specimen: Urine, Clean Catch  Result Value Ref Range Status   Specimen Description   Final    URINE, CLEAN CATCH Performed at [facility], [address], [place], [place] [address]    Special Requests   Final    NONE Performed at [facility], [address], [place], [place] [address]    Culture   Final    NO GROWTH Performed at [facility], [place] [address], [place],  [address]    Report Status 11/20/2020 FINAL  Final  Resp Panel by RT-PCR (Flu A&B, Covid) Nasopharyngeal Swab     Status: None   Collection Time: 11/18/20  1:46 PM   Specimen: Nasopharyngeal Swab; Nasopharyngeal(NP) swabs in vial transport medium  Result Value Ref Range Status   SARS Coronavirus 2 by RT PCR NEGATIVE NEGATIVE Final    Comment: (NOTE) SARS-CoV-2 target nucleic acids are NOT DETECTED.  The SARS-CoV-2 RNA is generally detectable in upper respiratory specimens during the acute phase of infection. The lowest concentration of SARS-CoV-2 viral copies this assay can detect is 138 copies/mL. A negative result does not preclude SARS-Cov-2 infection and should not be used as the sole basis for treatment or other patient management decisions. A negative result may occur with  improper specimen collection/handling, submission of specimen other than nasopharyngeal swab, presence of viral mutation(s) within the areas targeted by this assay, and inadequate number of viral copies(<138 copies/mL).  A negative result must be combined with clinical observations, patient history, and epidemiological information. The expected result is Negative.  Fact Sheet for Patients:  EntrepreneurPulse.com.au  Fact Sheet for Healthcare Providers:  IncredibleEmployment.be  This test is no t yet approved or cleared by the [place] FDA and  has been authorized for detection and/or diagnosis of SARS-CoV-2 by FDA  under an Emergency Use Authorization (EUA). This EUA will remain  in effect (meaning this test can be used) for the duration of the COVID-19 declaration under Section 564(b)(1) of the Act, 21 U.S.C.section 360bbb-3(b)(1), unless the authorization is terminated  or revoked sooner.       Influenza A by PCR NEGATIVE NEGATIVE Final   Influenza B by PCR NEGATIVE NEGATIVE Final    Comment: (NOTE) The Xpert Xpress SARS-CoV-2/FLU/RSV plus assay is intended as an aid in the diagnosis of influenza from Nasopharyngeal swab specimens and should not be used as a sole basis for treatment. Nasal washings and aspirates are unacceptable for Xpert Xpress SARS-CoV-2/FLU/RSV testing.  Fact Sheet for Patients: EntrepreneurPulse.com.au  Fact Sheet for Healthcare Providers: IncredibleEmployment.be  This test is not yet approved or cleared by the [place] FDA and has been authorized for detection and/or diagnosis of SARS-CoV-2 by FDA under an Emergency Use Authorization (EUA). This EUA will remain in effect (meaning this test can be used) for the duration of the COVID-19 declaration under Section 564(b)(1) of the Act, 21 U.S.C. section 360bbb-3(b)(1), unless the authorization is terminated or revoked.  Performed at [facility], [address], [place], [place] [address]      Time coordinating discharge: Over 30 minutes  SIGNED:   Shawna Clamp, MD  [facility] 11/20/2020, 1:19 PM Pager   If 7PM-7AM, please contact night-coverage [email or web address]

## 2020-11-23 ENCOUNTER — Other Ambulatory Visit: Payer: Self-pay | Admitting: Oncology

## 2020-11-23 LAB — CULTURE, BLOOD (ROUTINE X 2)
Culture: NO GROWTH
Culture: NO GROWTH
Special Requests: ADEQUATE

## 2020-11-24 ENCOUNTER — Other Ambulatory Visit: Payer: Self-pay | Admitting: Nurse Practitioner

## 2020-11-24 ENCOUNTER — Telehealth: Payer: Self-pay

## 2020-11-24 DIAGNOSIS — C20 Malignant neoplasm of rectum: Secondary | ICD-10-CM

## 2020-11-24 MED ORDER — HYDROCODONE-ACETAMINOPHEN 5-325 MG PO TABS: 1.0000 | ORAL_TABLET | Freq: Four times a day (QID) | ORAL | 0 refills | Status: DC | PRN

## 2020-11-24 NOTE — Telephone Encounter (Signed)
Return call to Pt stating she was discharged from the hospital and forgot to ask for something for pain. Pt states she has the same pain in her abdomen and wants to inform Dr. Benay Spice that she will come in for her appointment but does not feel strong enough for treatment. Dr Benay Spice notified. Pt states that the pain is radiating to her shoulder. Pt states she has tylenol but feels this is not strong enough for the pain. Message relayed to Dr Benay Spice to advise.

## 2020-11-26 ENCOUNTER — Other Ambulatory Visit: Payer: Self-pay

## 2020-11-26 ENCOUNTER — Encounter (HOSPITAL_COMMUNITY): Payer: Self-pay | Admitting: Internal Medicine

## 2020-11-26 ENCOUNTER — Inpatient Hospital Stay: Payer: BC Managed Care – PPO | Attending: Oncology

## 2020-11-26 ENCOUNTER — Inpatient Hospital Stay: Payer: BC Managed Care – PPO

## 2020-11-26 ENCOUNTER — Inpatient Hospital Stay (HOSPITAL_COMMUNITY)
Admission: AD | Admit: 2020-11-26 | Discharge: 2020-12-06 | DRG: 392 | Disposition: A | Discharge status: Home / Self Care | Payer: BC Managed Care – PPO | Source: Ambulatory Visit | Attending: Internal Medicine | Admitting: Internal Medicine

## 2020-11-26 ENCOUNTER — Inpatient Hospital Stay (HOSPITAL_BASED_OUTPATIENT_CLINIC_OR_DEPARTMENT_OTHER): Payer: BC Managed Care – PPO | Admitting: Oncology

## 2020-11-26 VITALS — BP 115/97 | HR 124 | Temp 98.0°F | Resp 18 | Ht 68.0 in | Wt 129.6 lb

## 2020-11-26 VITALS — BP 135/88 | HR 124 | Temp 98.6°F | Resp 18

## 2020-11-26 DIAGNOSIS — C20 Malignant neoplasm of rectum: Secondary | ICD-10-CM | POA: Diagnosis present

## 2020-11-26 DIAGNOSIS — T451X5A Adverse effect of antineoplastic and immunosuppressive drugs, initial encounter: Secondary | ICD-10-CM | POA: Diagnosis present

## 2020-11-26 DIAGNOSIS — R109 Unspecified abdominal pain: Secondary | ICD-10-CM

## 2020-11-26 DIAGNOSIS — K529 Noninfective gastroenteritis and colitis, unspecified: Secondary | ICD-10-CM | POA: Diagnosis present

## 2020-11-26 DIAGNOSIS — Z515 Encounter for palliative care: Secondary | ICD-10-CM

## 2020-11-26 DIAGNOSIS — R112 Nausea with vomiting, unspecified: Secondary | ICD-10-CM | POA: Diagnosis not present

## 2020-11-26 DIAGNOSIS — K5900 Constipation, unspecified: Secondary | ICD-10-CM | POA: Diagnosis present

## 2020-11-26 DIAGNOSIS — R188 Other ascites: Secondary | ICD-10-CM

## 2020-11-26 DIAGNOSIS — Z20822 Contact with and (suspected) exposure to covid-19: Secondary | ICD-10-CM | POA: Diagnosis present

## 2020-11-26 DIAGNOSIS — F411 Generalized anxiety disorder: Secondary | ICD-10-CM | POA: Diagnosis present

## 2020-11-26 DIAGNOSIS — R509 Fever, unspecified: Secondary | ICD-10-CM

## 2020-11-26 DIAGNOSIS — C7800 Secondary malignant neoplasm of unspecified lung: Secondary | ICD-10-CM | POA: Diagnosis present

## 2020-11-26 DIAGNOSIS — E861 Hypovolemia: Secondary | ICD-10-CM | POA: Diagnosis present

## 2020-11-26 DIAGNOSIS — K219 Gastro-esophageal reflux disease without esophagitis: Secondary | ICD-10-CM | POA: Diagnosis present

## 2020-11-26 DIAGNOSIS — Z87891 Personal history of nicotine dependence: Secondary | ICD-10-CM

## 2020-11-26 DIAGNOSIS — I10 Essential (primary) hypertension: Secondary | ICD-10-CM | POA: Diagnosis present

## 2020-11-26 DIAGNOSIS — R110 Nausea: Secondary | ICD-10-CM

## 2020-11-26 DIAGNOSIS — D638 Anemia in other chronic diseases classified elsewhere: Secondary | ICD-10-CM | POA: Diagnosis present

## 2020-11-26 DIAGNOSIS — D509 Iron deficiency anemia, unspecified: Secondary | ICD-10-CM | POA: Diagnosis present

## 2020-11-26 DIAGNOSIS — E871 Hypo-osmolality and hyponatremia: Secondary | ICD-10-CM | POA: Diagnosis present

## 2020-11-26 DIAGNOSIS — E876 Hypokalemia: Secondary | ICD-10-CM | POA: Diagnosis not present

## 2020-11-26 DIAGNOSIS — G620 Drug-induced polyneuropathy: Secondary | ICD-10-CM

## 2020-11-26 DIAGNOSIS — Z79899 Other long term (current) drug therapy: Secondary | ICD-10-CM

## 2020-11-26 DIAGNOSIS — Z681 Body mass index (BMI) 19 or less, adult: Secondary | ICD-10-CM | POA: Diagnosis not present

## 2020-11-26 DIAGNOSIS — Z933 Colostomy status: Secondary | ICD-10-CM

## 2020-11-26 DIAGNOSIS — R197 Diarrhea, unspecified: Secondary | ICD-10-CM | POA: Diagnosis present

## 2020-11-26 DIAGNOSIS — Z882 Allergy status to sulfonamides status: Secondary | ICD-10-CM

## 2020-11-26 DIAGNOSIS — R52 Pain, unspecified: Secondary | ICD-10-CM | POA: Diagnosis not present

## 2020-11-26 DIAGNOSIS — C7801 Secondary malignant neoplasm of right lung: Secondary | ICD-10-CM | POA: Diagnosis not present

## 2020-11-26 DIAGNOSIS — E860 Dehydration: Secondary | ICD-10-CM | POA: Diagnosis present

## 2020-11-26 DIAGNOSIS — E440 Moderate protein-calorie malnutrition: Secondary | ICD-10-CM | POA: Diagnosis present

## 2020-11-26 DIAGNOSIS — R140 Abdominal distension (gaseous): Secondary | ICD-10-CM | POA: Diagnosis not present

## 2020-11-26 DIAGNOSIS — D569 Thalassemia, unspecified: Secondary | ICD-10-CM | POA: Diagnosis present

## 2020-11-26 DIAGNOSIS — Z808 Family history of malignant neoplasm of other organs or systems: Secondary | ICD-10-CM

## 2020-11-26 DIAGNOSIS — Z803 Family history of malignant neoplasm of breast: Secondary | ICD-10-CM

## 2020-11-26 DIAGNOSIS — G893 Neoplasm related pain (acute) (chronic): Secondary | ICD-10-CM

## 2020-11-26 DIAGNOSIS — C7802 Secondary malignant neoplasm of left lung: Secondary | ICD-10-CM

## 2020-11-26 DIAGNOSIS — Z801 Family history of malignant neoplasm of trachea, bronchus and lung: Secondary | ICD-10-CM

## 2020-11-26 LAB — CBC WITH DIFFERENTIAL (CANCER CENTER ONLY)
Abs Immature Granulocytes: 0.1 10*3/uL — ABNORMAL HIGH (ref 0.00–0.07)
Basophils Absolute: 0 10*3/uL (ref 0.0–0.1)
Basophils Relative: 0 %
Eosinophils Absolute: 0 10*3/uL (ref 0.0–0.5)
Eosinophils Relative: 0 %
HCT: 36.8 % (ref 36.0–46.0)
Hemoglobin: 11.4 g/dL — ABNORMAL LOW (ref 12.0–15.0)
Immature Granulocytes: 1 %
Lymphocytes Relative: 8 %
Lymphs Abs: 0.8 10*3/uL (ref 0.7–4.0)
MCH: 21.6 pg — ABNORMAL LOW (ref 26.0–34.0)
MCHC: 31 g/dL (ref 30.0–36.0)
MCV: 69.6 fL — ABNORMAL LOW (ref 80.0–100.0)
Monocytes Absolute: 1.1 10*3/uL — ABNORMAL HIGH (ref 0.1–1.0)
Monocytes Relative: 12 %
Neutro Abs: 7.8 10*3/uL — ABNORMAL HIGH (ref 1.7–7.7)
Neutrophils Relative %: 79 %
Platelet Count: 287 10*3/uL (ref 150–400)
RBC: 5.29 MIL/uL — ABNORMAL HIGH (ref 3.87–5.11)
RDW: 21.2 % — ABNORMAL HIGH (ref 11.5–15.5)
WBC Count: 9.8 10*3/uL (ref 4.0–10.5)
nRBC: 0.3 % — ABNORMAL HIGH (ref 0.0–0.2)

## 2020-11-26 LAB — CMP (CANCER CENTER ONLY)
ALT: 10 U/L (ref 0–44)
AST: 33 U/L (ref 15–41)
Albumin: 3.3 g/dL — ABNORMAL LOW (ref 3.5–5.0)
Alkaline Phosphatase: 253 U/L — ABNORMAL HIGH (ref 38–126)
Anion gap: 9 (ref 5–15)
BUN: 7 mg/dL (ref 6–20)
CO2: 27 mmol/L (ref 22–32)
Calcium: 8.7 mg/dL — ABNORMAL LOW (ref 8.9–10.3)
Chloride: 96 mmol/L — ABNORMAL LOW (ref 98–111)
Creatinine: 0.38 mg/dL — ABNORMAL LOW (ref 0.44–1.00)
GFR, Estimated: >60 mL/min (ref >60)
Glucose, Bld: 121 mg/dL — ABNORMAL HIGH (ref 70–99)
Potassium: 3.6 mmol/L (ref 3.5–5.1)
Sodium: 132 mmol/L — ABNORMAL LOW (ref 135–145)
Total Bilirubin: 1 mg/dL (ref 0.3–1.2)
Total Protein: 5.6 g/dL — ABNORMAL LOW (ref 6.5–8.1)

## 2020-11-26 LAB — C DIFFICILE QUICK SCREEN W PCR REFLEX
C Diff antigen: NEGATIVE
C Diff interpretation: NOT DETECTED
C Diff toxin: NEGATIVE

## 2020-11-26 LAB — CEA (ACCESS): CEA (CHCC): 4.75 ng/mL (ref 0.00–5.00)

## 2020-11-26 LAB — SARS CORONAVIRUS 2 (TAT 6-24 HRS): SARS Coronavirus 2: NEGATIVE

## 2020-11-26 LAB — TOTAL PROTEIN, URINE DIPSTICK: Protein, ur: 30 mg/dL — AB

## 2020-11-26 LAB — CEA (IN HOUSE-CHCC): CEA (CHCC-In House): 4.38 ng/mL (ref 0.00–5.00)

## 2020-11-26 MED ORDER — HYDROCODONE-ACETAMINOPHEN 5-325 MG PO TABS
1.0000 | ORAL_TABLET | Freq: Four times a day (QID) | ORAL | Status: DC | PRN
  Administered 2020-11-27 – 2020-11-30 (×4): 1 via ORAL
  Filled 2020-11-26 (×4): qty 1

## 2020-11-26 MED ORDER — ENSURE ENLIVE PO LIQD
237.0000 mL | Freq: Two times a day (BID) | ORAL | Status: DC
  Administered 2020-11-28: 237 mL via ORAL

## 2020-11-26 MED ORDER — DIPHENOXYLATE-ATROPINE 2.5-0.025 MG PO TABS: 2.0000 | ORAL_TABLET | Freq: Four times a day (QID) | ORAL | Status: DC | PRN

## 2020-11-26 MED ORDER — SODIUM CHLORIDE 0.9 % IV SOLN
12.5000 mg | INTRAVENOUS | Status: AC
  Administered 2020-11-26: 12.5 mg via INTRAVENOUS
  Filled 2020-11-26: qty 12.5

## 2020-11-26 MED ORDER — BOOST / RESOURCE BREEZE PO LIQD CUSTOM
1.0000 | Freq: Three times a day (TID) | ORAL | Status: DC
  Administered 2020-11-26 – 2020-12-05 (×19): 1 via ORAL

## 2020-11-26 MED ORDER — HYDROMORPHONE HCL 1 MG/ML IJ SOLN
1.0000 mg | Freq: Once | INTRAMUSCULAR | Status: AC
  Administered 2020-11-26: 1 mg via INTRAVENOUS
  Filled 2020-11-26: qty 1

## 2020-11-26 MED ORDER — MAGNESIUM OXIDE -MG SUPPLEMENT 400 (240 MG) MG PO TABS
400.0000 mg | ORAL_TABLET | Freq: Two times a day (BID) | ORAL | Status: DC
  Administered 2020-11-26 – 2020-12-06 (×20): 400 mg via ORAL
  Filled 2020-11-26 (×20): qty 1

## 2020-11-26 MED ORDER — SODIUM CHLORIDE 0.9 % IV SOLN: 12.5000 mg | INTRAVENOUS | Status: DC

## 2020-11-26 MED ORDER — PANTOPRAZOLE SODIUM 40 MG PO TBEC
40.0000 mg | DELAYED_RELEASE_TABLET | Freq: Every day | ORAL | Status: DC
  Filled 2020-11-26: qty 1

## 2020-11-26 MED ORDER — SODIUM CHLORIDE 0.9 % IV SOLN
12.5000 mg | Freq: Four times a day (QID) | INTRAVENOUS | Status: DC | PRN
  Filled 2020-11-26 (×2): qty 0.5

## 2020-11-26 MED ORDER — SODIUM CHLORIDE 0.9 % IV SOLN
12.5000 mg | INTRAVENOUS | Status: AC
  Administered 2020-11-27 (×5): 12.5 mg via INTRAVENOUS
  Filled 2020-11-26 (×5): qty 12.5
  Filled 2020-11-26: qty 0.5

## 2020-11-26 MED ORDER — POTASSIUM CHLORIDE CRYS ER 20 MEQ PO TBCR
20.0000 meq | EXTENDED_RELEASE_TABLET | Freq: Two times a day (BID) | ORAL | Status: DC
  Administered 2020-11-26 – 2020-11-27 (×2): 20 meq via ORAL
  Filled 2020-11-26 (×2): qty 1

## 2020-11-26 MED ORDER — DICYCLOMINE HCL 10 MG PO CAPS
10.0000 mg | ORAL_CAPSULE | Freq: Three times a day (TID) | ORAL | Status: AC
  Administered 2020-11-26 – 2020-11-27 (×4): 10 mg via ORAL
  Filled 2020-11-26 (×4): qty 1

## 2020-11-26 MED ORDER — LACTATED RINGERS IV SOLN: INTRAVENOUS | Status: DC

## 2020-11-26 MED ORDER — DEXAMETHASONE 4 MG PO TABS: 4.0000 mg | ORAL_TABLET | Freq: Two times a day (BID) | ORAL | Status: DC

## 2020-11-26 MED ORDER — LORATADINE 10 MG PO TABS
10.0000 mg | ORAL_TABLET | Freq: Every day | ORAL | Status: DC | PRN
  Administered 2020-11-29: 10 mg via ORAL
  Filled 2020-11-26: qty 1

## 2020-11-26 MED ORDER — PANTOPRAZOLE SODIUM 40 MG IV SOLR
40.0000 mg | INTRAVENOUS | Status: DC
  Filled 2020-11-26: qty 40

## 2020-11-26 MED ORDER — SODIUM CHLORIDE 0.9 % IV SOLN
INTRAVENOUS | Status: DC
  Filled 2020-11-26 (×2): qty 250

## 2020-11-26 MED ORDER — PROCHLORPERAZINE EDISYLATE 10 MG/2ML IJ SOLN
10.0000 mg | Freq: Four times a day (QID) | INTRAMUSCULAR | Status: DC | PRN
  Administered 2020-11-26 – 2020-12-03 (×8): 10 mg via INTRAVENOUS
  Filled 2020-11-26 (×9): qty 2

## 2020-11-26 MED ORDER — MORPHINE SULFATE (PF) 2 MG/ML IV SOLN
2.0000 mg | INTRAVENOUS | Status: DC | PRN
  Administered 2020-11-26 – 2020-11-27 (×4): 2 mg via INTRAVENOUS
  Filled 2020-11-26 (×4): qty 1

## 2020-11-26 MED ORDER — ALPRAZOLAM 0.5 MG PO TABS
0.5000 mg | ORAL_TABLET | Freq: Every day | ORAL | Status: DC
  Administered 2020-11-26: 0.5 mg via ORAL
  Filled 2020-11-26: qty 1

## 2020-11-26 MED ORDER — ONDANSETRON HCL 4 MG/2ML IJ SOLN
4.0000 mg | Freq: Four times a day (QID) | INTRAMUSCULAR | Status: DC | PRN
  Administered 2020-11-27 – 2020-12-05 (×3): 4 mg via INTRAVENOUS
  Filled 2020-11-26 (×4): qty 2

## 2020-11-26 MED ORDER — ENOXAPARIN SODIUM 40 MG/0.4ML IJ SOSY
40.0000 mg | PREFILLED_SYRINGE | INTRAMUSCULAR | Status: DC
  Administered 2020-11-26 – 2020-11-27 (×2): 40 mg via SUBCUTANEOUS
  Filled 2020-11-26 (×2): qty 0.4

## 2020-11-26 NOTE — H&P (Signed)
History and Physical  Tina Morales XLK:[id number] DOB: October 21, 1965 DOA: 11/26/2020  Referring physician: Accepting physician Dr. Neysa Morales, [facility].  PCP: Tina Pier, MD  Outpatient Specialists: Oncology. Patient coming from: Oncology's office through home  Chief Complaint: Nausea vomiting diarrhea.  HPI: Tina Morales is a 55 y.o. female with medical history significant for rectal cancer, history of diarrhea and rectal pain secondary to #1, tobacco use disorder, iron deficiency anemia, migraines, essential hypertension, polyneuropathy, who was recently admitted on 11/18/2020 for the same.  At that time CT was consistent with enteritis/colitis.  Her clinical status improved with hydration and antibiotics in the hospital.  She completed an outpatient course of Augmentin on 11/25/2020.  She presented again today for the same complaints.  Due to worsening diarrhea,nausea and vomiting with concern for dehydration she was advised to come to the ED from oncology center for further evaluation.  Per medical oncology her symptoms are unlikely related to chemotherapy.  Recommended a GI consult to evaluate her symptoms and the her CT findings from 11/18/20.  While interviewing the patient, she continues to have nausea despite IV Compazine.  Started IV phenergan scheduled x 1 day.  Will obtain 12 lead EKG tomorrow to monitor QTC.  ED Course:  Direct admit from oncology center, Dr. Gearldine Shown office, to [facility] 6 E, oncology unit.  Review of Systems: Review of systems as noted in the HPI. All other systems reviewed and are negative.   Past Medical History:  Diagnosis Date  . Adenocarcinoma ([place]) 02/2018  . Anxiety   . Cancer ([place]) 02/10/2018   Stage 4 Colorectal Cancer, spot on her lungs  . Migraine    Past Surgical History:  Procedure Laterality Date  . ADENOIDECTOMY    . CYSTOSCOPY W/ RETROGRADES Bilateral 09/01/2018   Procedure: CYSTOSCOPY WITH BILATERAL  RETROGRADE PYELOGRAM;  Surgeon: Tina Frock, MD;   Location: [facility];  Service: Urology;  Laterality: Bilateral;  . CYSTOSCOPY WITH STENT PLACEMENT Bilateral 09/01/2018   Procedure: CYSTOSCOPY WITH URETERAL COOK CATHETER PLACEMENT;  Surgeon: Tina Frock, MD;  Location: [facility];  Service: Urology;  Laterality: Bilateral;  . IR CV LINE INJECTION  06/09/2020  . ROBOTIC ASSISTED TOTAL HYSTERECTOMY WITH BILATERAL SALPINGO OOPHERECTOMY Bilateral 09/01/2018   Procedure: XI ROBOTIC ASSISTED PARTIAL VAGINECTOMY;  Surgeon: Tina Amber, MD;  Location: [facility];  Service: Gynecology;  Laterality: Bilateral;  . TONSILLECTOMY      Social History:  reports that she quit smoking about 2 years ago. She quit after 30.00 years of use. She has never used smokeless tobacco. She reports previous alcohol use. She reports that she does not use drugs.   Allergies  Allergen Reactions  . Sulfa Antibiotics     Patient unaware of side effects, she was told when she was a child that she was allergic    Family History  Problem Relation Age of Onset  . Breast cancer Maternal Aunt   . Throat cancer Cousin   . Cancer Cousin   . Throat cancer Cousin   . Lung cancer Paternal Uncle       Prior to Admission medications   Medication Sig Start Date End Date Taking? Authorizing Provider  acetaminophen (TYLENOL) 500 MG tablet Take 1,000 mg by mouth every 8 (eight) hours as needed (PAIN).     [provider]  ALPRAZolam Duanne Moron) 0.5 MG tablet Take 1 tablet (0.5 mg total) by mouth 2 (two) times daily as needed for anxiety. Patient taking differently: Take 0.5 mg by mouth at bedtime. 08/14/20  Tina Pier, MD  bisacodyl (DULCOLAX) 5 MG EC tablet Take 15 mg by mouth daily as needed for moderate constipation. Takes for few days after each tx     [provider]  dexamethasone (DECADRON) 4 MG tablet #2 tablets twice daily x 3 days. Start day after chemo treatment 11/05/20   Tina Pier, MD  diphenoxylate-atropine (LOMOTIL) 2.5-0.025 MG tablet Take 2  tablets by mouth 4 (four) times daily as needed for diarrhea or loose stools. 06/12/20   Tina Pier, MD  HYDROcodone-acetaminophen (NORCO) 5-325 MG tablet Take 1 tablet by mouth every 6 (six) hours as needed for moderate pain. 11/24/20   Owens Shark, NP  lidocaine-prilocaine (EMLA) cream Apply 1 application topically as needed (port). 06/30/20   Tina Pier, MD  loratadine (CLARITIN) 10 MG tablet Take 10 mg by mouth daily as needed for allergies.    [provider]  Magnesium Oxide 250 MG TABS Take 500 mg by mouth 2 (two) times daily.    [provider]  omeprazole (PRILOSEC) 20 MG capsule Take 20 mg by mouth daily as needed (indigestion).    [provider]  Polyethylene Glycol 3350 (MIRALAX PO) Take 17 g by mouth daily as needed (constipation).    [provider]  potassium chloride SA (KLOR-CON) 20 MEQ tablet Take 1 tablet (20 mEq total) by mouth 2 (two) times daily. 11/05/20   Tina Pier, MD  prochlorperazine (COMPAZINE) 10 MG tablet Take 1 tablet (10 mg total) by mouth every 6 (six) hours as needed for nausea or vomiting. 06/12/20   Tina Pier, MD    Physical Exam: There were no vitals taken for this visit.  . General: 55 y.o. year-old female well developed well nourished in no acute distress.  Alert and oriented x3.  Appears uncomfortable due to intractable nausea and vomiting. . Cardiovascular: Regular rate and rhythm with no rubs or gallops.  No thyromegaly or JVD noted.  No lower extremity edema. 2/4 pulses in all 4 extremities. Marland Kitchen Respiratory: Clear to auscultation with no wheezes or rales. Good inspiratory effort. . Abdomen: Soft nontender nondistended with normal bowel sounds x4 quadrants.  Left lower quadrant colostomy bag present. . Muskuloskeletal: No cyanosis, clubbing or edema noted bilaterally . Neuro: CN II-XII intact, strength, sensation, reflexes . Skin: No ulcerative lesions noted or rashes . Psychiatry: Judgement and  insight appear normal. Mood is appropriate for condition and setting          Labs on Admission:  Basic Metabolic Panel: Recent Labs  Lab 11/20/20 0423 11/26/20 0919  NA 133* 132*  K 3.6 3.6  CL 100 96*  CO2 29 27  GLUCOSE 95 121*  BUN <5* 7  CREATININE 0.37* 0.38*  CALCIUM 8.2* 8.7*   Liver Function Tests: Recent Labs  Lab 11/26/20 0919  AST 33  ALT 10  ALKPHOS 253*  BILITOT 1.0  PROT 5.6*  ALBUMIN 3.3*   No results for input(s): LIPASE, AMYLASE in the last 168 hours. No results for input(s): AMMONIA in the last 168 hours. CBC: Recent Labs  Lab 11/20/20 0423 11/26/20 0919  WBC 6.8 9.8  NEUTROABS 5.2 7.8*  HGB 9.3* 11.4*  HCT 29.7* 36.8  MCV 68.3* 69.6*  PLT 178 287   Cardiac Enzymes: No results for input(s): CKTOTAL, CKMB, CKMBINDEX, TROPONINI in the last 168 hours.  BNP (last 3 results) No results for input(s): BNP in the last 8760 hours.  ProBNP (last 3 results) No results  for input(s): PROBNP in the last 8760 hours.  CBG: No results for input(s): GLUCAP in the last 168 hours.  Radiological Exams on Admission: No results found.  EKG: I independently viewed the EKG done and my findings are as followed: None available at the time of this visit.  Assessment/Plan Present on Admission: . Diarrhea  Active Problems:   Diarrhea  Diarrhea, worsening acutely Rule out infective process Obtain C. difficile PCR, acute GI panel. Supportive care IV fluid hydration Antimotility agent GI consult  Intractable nausea and vomiting States Zofran has not worked for her in the past. IV Phenergan scheduled x1 day Obtain twelve-lead EKG to assess QTC while on antiemetics  Hypovolemic hyponatremia Serum sodium 132 Continue IV fluid hydration Repeat BMP in the morning  Moderate protein calorie malnutrition BMI 19 Abdomen 3.3 Encourage oral intake once nausea and vomiting are controlled.  Rectal cancer, follows with GI oncology Management per  oncology, Dr. Benay Spice  GERD IV Protonix 40 mg daily.  Generalized anxiety Resume home Xanax.   DVT prophylaxis: Subcu Lovenox daily.  Code Status: Full code.  Family Communication: None at bedside.  Disposition Plan: Admitted as a direct admission to oncology unit 6 E. at [place] called: GI consulted.  Admission status: Inpatient status.  Patient will require at least 2 midnights for further evaluation and treatment of present condition.   Status is: Inpatient   Dispo: The patient is from: Home.              Anticipated d/c is to: Home once GI and medical oncology sign off.               Patient currently not stable for discharge due to ongoing management of intractable nausea vomiting.    Difficult to place patient, not applicable.       Kayleen Memos MD [facility] Pager [phone]  If 7PM-7AM, please contact night-coverage [email or web address] Password [facility]  11/26/2020, 4:25 PM

## 2020-11-26 NOTE — Progress Notes (Signed)
Pt 's HR elevated, triggering yellow MEWS protocol. Per pt report, this is her baseline. Pt placed on telemetry per MD order. Will continue to monitor.   MEWS Guidelines - (patients age 53 and over)   11/26/20 1658  Assess: MEWS Score  Temp 98.7 F (37.1 C)  BP (!) 154/98  Pulse Rate (!) 121  Resp 16  Level of Consciousness Alert  SpO2 100 %  O2 Device Room Air  Assess: MEWS Score  MEWS Temp 0  MEWS Systolic 0  MEWS Pulse 2  MEWS RR 0  MEWS LOC 0  MEWS Score 2  MEWS Score Color Yellow  Assess: if the MEWS score is Yellow or Red  Were vital signs taken at a resting state? Yes  Focused Assessment Change from prior assessment (see assessment flowsheet)  Does the patient meet 2 or more of the SIRS criteria? No  MEWS guidelines implemented *See Row Information* Yes  Treat  MEWS Interventions Administered prn meds/treatments  Pain Scale 0-10  Pain Score 3  Pain Type Acute pain  Pain Location Abdomen  Pain Orientation Right;Left  Pain Descriptors / Indicators Cramping  Pain Frequency Constant  Pain Onset On-going  Patients Stated Pain Goal 2  Pain Intervention(s) Refused;Repositioned  Multiple Pain Sites Yes  Complains of Nausea /  Vomiting  Patients response to intervention Effective  Take Vital Signs  Increase Vital Sign Frequency  Yellow: Q 2hr X 2 then Q 4hr X 2, if remains yellow, continue Q 4hrs  Escalate  MEWS: Escalate Yellow: discuss with charge nurse/RN and consider discussing with provider and RRT  Notify: Charge Nurse/RN  Name of Charge Nurse/RN Notified Kim RN  Date Charge Nurse/RN Notified 11/26/20  Time Charge Nurse/RN Notified 4332  Document  Patient Outcome Stabilized after interventions  Progress note created (see row info) Yes  Assess: SIRS CRITERIA  SIRS Temperature  0  SIRS Pulse 1  SIRS Respirations  0  SIRS WBC 0  SIRS Score Sum  1

## 2020-11-26 NOTE — Progress Notes (Signed)
Pt being admitted to [facility] for pain control. procedures explained, verbalized understanding.

## 2020-11-26 NOTE — Patient Instructions (Signed)
Rehydration, Adult Rehydration is the replacement of body fluids, salts, and minerals (electrolytes) that are lost during dehydration. Dehydration is when there is not enough water or other fluids in the body. This happens when you lose more fluids than you take in. Common causes of dehydration include:  Not drinking enough fluids. This can occur when you are ill or doing activities that require a lot of energy, especially in hot weather.  Conditions that cause loss of water or other fluids, such as diarrhea, vomiting, sweating, or urinating a lot.  Other illnesses, such as fever or infection.  Certain medicines, such as those that remove excess fluid from the body (diuretics). Symptoms of mild or moderate dehydration may include thirst, dry lips and mouth, and dizziness. Symptoms of severe dehydration may include increased heart rate, confusion, fainting, and not urinating. For severe dehydration, you may need to get fluids through an IV at the hospital. For mild or moderate dehydration, you can usually rehydrate at home by drinking certain fluids as told by your health care provider. What are the risks? Generally, rehydration is safe. However, taking in too much fluid (overhydration) can be a problem. This is rare. Overhydration can cause an electrolyte imbalance, kidney failure, or a decrease in salt (sodium) levels in the body. Supplies needed You will need an oral rehydration solution (ORS) if your health care provider tells you to use one. This is a drink to treat dehydration. It can be found in pharmacies and retail stores. How to rehydrate Fluids Follow instructions from your health care provider for rehydration. The kind of fluid and the amount you should drink depend on your condition. In general, you should choose drinks that you prefer.  If told by your health care provider, drink an ORS. ? Make an ORS by following instructions on the package. ? Start by drinking small amounts,  about  cup (120 mL) every 5-10 minutes. ? Slowly increase how much you drink until you have taken the amount recommended by your health care provider.  Drink enough clear fluids to keep your urine pale yellow. If you were told to drink an ORS, finish it first, then start slowly drinking other clear fluids. Drink fluids such as: ? Water. This includes sparkling water and flavored water. Drinking only water can lead to having too little sodium in your body (hyponatremia). Follow the advice of your health care provider. ? Water from ice chips you suck on. ? Fruit juice with water you add to it (diluted). ? Sports drinks. ? Hot or cold herbal teas. ? Broth-based soups. ? Milk or milk products. Food Follow instructions from your health care provider about what to eat while you rehydrate. Your health care provider may recommend that you slowly begin eating regular foods in small amounts.  Eat foods that contain a healthy balance of electrolytes, such as bananas, oranges, potatoes, tomatoes, and spinach.  Avoid foods that are greasy or contain a lot of sugar. In some cases, you may get nutrition through a feeding tube that is passed through your nose and into your stomach (nasogastric tube, or NG tube). This may be done if you have uncontrolled vomiting or diarrhea.   Beverages to avoid Certain beverages may make dehydration worse. While you rehydrate, avoid drinking alcohol.   How to tell if you are recovering from dehydration You may be recovering from dehydration if:  You are urinating more often than before you started rehydrating.  Your urine is pale yellow.  Your energy level   improves.  You vomit less frequently.  You have diarrhea less frequently.  Your appetite improves or returns to normal.  You feel less dizzy or less light-headed.  Your skin tone and color start to look more normal. Follow these instructions at home:  Take over-the-counter and prescription medicines only  as told by your health care provider.  Do not take sodium tablets. Doing this can lead to having too much sodium in your body (hypernatremia). Contact a health care provider if:  You continue to have symptoms of mild or moderate dehydration, such as: ? Thirst. ? Dry lips. ? Slightly dry mouth. ? Dizziness. ? Dark urine or less urine than normal. ? Muscle cramps.  You continue to vomit or have diarrhea. Get help right away if you:  Have symptoms of dehydration that get worse.  Have a fever.  Have a severe headache.  Have been vomiting and the following happens: ? Your vomiting gets worse or does not go away. ? Your vomit includes blood or green matter (bile). ? You cannot eat or drink without vomiting.  Have problems with urination or bowel movements, such as: ? Diarrhea that gets worse or does not go away. ? Blood in your stool (feces). This may cause stool to look black and tarry. ? Not urinating, or urinating only a small amount of very dark urine, within 6-8 hours.  Have trouble breathing.  Have symptoms that get worse with treatment. These symptoms may represent a serious problem that is an emergency. Do not wait to see if the symptoms will go away. Get medical help right away. Call your local emergency services (911 in the [place] Do not drive yourself to the hospital. Summary  Rehydration is the replacement of body fluids and minerals (electrolytes) that are lost during dehydration.  Follow instructions from your health care provider for rehydration. The kind of fluid and amount you should drink depend on your condition.  Slowly increase how much you drink until you have taken the amount recommended by your health care provider.  Contact your health care provider if you continue to show signs of mild or moderate dehydration. This information is not intended to replace advice given to you by your health care provider. Make sure you discuss any questions you have with  your health care provider. Document Revised: 08/29/2019 Document Reviewed: 07/09/2019 Elsevier Patient Education  2021 [place].  Hydromorphone injection What is this medicine? HYDROMORPHONE (hye droe MOR fone) is a pain reliever. It is used to treat moderate to severe pain. This medicine may be used for other purposes; ask your health care provider or pharmacist if you have questions. COMMON BRAND NAME(S): Dilaudid, Dilaudid-HP, Simplist Dilaudid What should I tell my health care provider before I take this medicine? They need to know if you have any of these conditions:  brain tumor  drug abuse or addiction  head injury  heart disease  if you often drink alcohol  kidney disease  liver disease  lung or breathing disease, like asthma  problems urinating  seizures  stomach or intestine problems  an unusual or allergic reaction to hydromorphone, other medicines, foods, dyes, or preservatives  pregnant or trying to get pregnant  breast-feeding How should I use this medicine? This medicine is for injection into a vein, into a muscle, or under the skin. It is usually given by a health care professional in a hospital or clinic setting. In rare cases, you might get this medicine at home. You will be  taught how to give this medicine. Use exactly as directed. Take your medicine at regular intervals. Do not take your medicine more often than directed. It is important that you put your used needles and syringes in a special sharps container. Do not put them in a trash can. If you do not have a sharps container, call your pharmacist or healthcare provider to get one. Talk to your pediatrician regarding the use of this medicine in children. Special care may be needed. Overdosage: If you think you have taken too much of this medicine contact a poison control center or emergency room at once. NOTE: This medicine is only for you. Do not share this medicine with others. What if I  miss a dose? If you miss a dose, use it as soon as you can. If it is almost time for your next dose, use only that dose. Do not use double or extra doses. What may interact with this medicine? This medicine may interact with the following medications:  alcohol  antihistamines for allergy, cough and cold  certain medicines for anxiety or sleep  certain medicines for depression like amitriptyline, fluoxetine, sertraline  certain medicines for seizures like phenobarbital, primidone  general anesthetics like halothane, isoflurane, methoxyflurane, propofol  local anesthetics like lidocaine, pramoxine, tetracaine  MAOIs like Carbex, Eldepryl, Marplan, Nardil, and Parnate  medicines that relax muscles for surgery  other narcotic medicines for pain or cough  phenothiazines like chlorpromazine, mesoridazine, prochlorperazine, thioridazine This list may not describe all possible interactions. Give your health care provider a list of all the medicines, herbs, non-prescription drugs, or dietary supplements you use. Also tell them if you smoke, drink alcohol, or use illegal drugs. Some items may interact with your medicine. What should I watch for while using this medicine? Tell your health care provider if your pain does not go away, if it gets worse, or if you have new or a different type of pain. You may develop tolerance to this drug. Tolerance means that you will need a higher dose of the drug for pain relief. Tolerance is normal and is expected if you take this drug for a long time. There are different types of narcotic drugs (opioids) for pain. If you take more than one type at the same time, you may have more side effects. Give your health care provider a list of all drugs you use. He or she will tell you how much drug to take. Do not take more drug than directed. Get emergency help right away if you have problems breathing. Do not suddenly stop taking your drug because you may develop a  severe reaction. Your body becomes used to the drug. This does NOT mean you are addicted. Addiction is a behavior related to getting and using a drug for a nonmedical reason. If you have pain, you have a medical reason to take pain drug. Your health care provider will tell you how much drug to take. If your health care provider wants you to stop the drug, the dose will be slowly lowered over time to avoid any side effects. Talk to your health care provider about naloxone and how to get it. Naloxone is an emergency drug used for an opioid overdose. An overdose can happen if you take too much opioid. It can also happen if an opioid is taken with some other drugs or substances, like alcohol. Know the symptoms of an overdose, like trouble breathing, unusually tired or sleepy, or not being able to respond or  wake up. Make sure to tell caregivers and close contacts where it is stored. Make sure they know how to use it. After naloxone is given, you must get emergency help right away. Naloxone is a temporary treatment. Repeat doses may be needed. You may get drowsy or dizzy. Do not drive, use machinery, or do anything that needs mental alertness until you know how this drug affects you. Do not stand up or sit up quickly, especially if you are an older patient. This reduces the risk of dizzy or fainting spells. Alcohol may interfere with the effect of this drug. Avoid alcoholic drinks. This drug will cause constipation. If you do not have a bowel movement for 3 days, call your health care provider. Your mouth may get dry. Chewing sugarless gum or sucking hard candy and drinking plenty of water may help. Contact your health care provider if the problem does not go away or is severe. What side effects may I notice from receiving this medicine? Side effects that you should report to your doctor or health care professional as soon as possible:  allergic reactions like skin rash, itching or hives, swelling of the face,  lips, or tongue  breathing problems  confusion  seizures  signs and symptoms of low blood pressure like dizziness; feeling faint or lightheaded, falls; unusually weak or tired  trouble passing urine or change in the amount of urine Side effects that usually do not require medical attention (report to your doctor or health care professional if they continue or are bothersome):  constipation  dry mouth  nausea, vomiting  tiredness This list may not describe all possible side effects. Call your doctor for medical advice about side effects. You may report side effects to FDA at 1-800-FDA-1088. Where should I keep my medicine? Keep out of the reach of children. This medicine can be abused. Keep your medicine in a safe place to protect it from theft. Do not share this medicine with anyone. Selling or giving away this medicine is dangerous and against the law. If you are using this medicine at home, you will be instructed on how to store this medicine. This medicine may cause accidental overdose and death if it is taken by other adults, children, or pets. Flush any unused medicine down the toilet to reduce the chance of harm. Do not use the medicine after the expiration date. NOTE: This sheet is a summary. It may not cover all possible information. If you have questions about this medicine, talk to your doctor, pharmacist, or health care provider.  2021 Elsevier/Gold Standard (2019-02-05 11:27:33)

## 2020-11-26 NOTE — Progress Notes (Signed)
Tina Morales   Diagnosis: Rectal cancer  INTERVAL HISTORY:   Tina Morales was discharged on 11/20/2020 after admission with nausea/vomiting, fever, and diarrhea.  She was tolerating a diet when discharged.  She complains of persistent nausea, but no vomiting since discharge from the hospital.  She has a low-grade fever and increased abdominal pain.  Hydrocodone relieves the pain transiently.  She is having loose stool.  She empties the ostomy bag 4-5 times per day for 2 to 4 ounces of stool.  Objective:  Vital signs in last 24 hours:  Blood pressure (!) 115/97, pulse (!) 124, temperature 98 F (36.7 C), temperature source Oral, resp. rate 18, height 5' 8"  (1.727 m), weight 129 lb 9.6 oz (58.8 kg), SpO2 96 %.    HEENT: The mucous membranes are dry, no thrush or ulcers Resp: Lungs clear bilaterally Cardio: Regular rhythm, tachycardia GI: Mildly distended, diffuse tenderness, hypoactive bowel sounds, left lower quadrant colostomy with a small amount of brown liquid stool Vascular: No leg edema   Portacath/PICC-without erythema  Lab Results:  Lab Results  Component Value Date   WBC 6.8 11/20/2020   HGB 9.3 (L) 11/20/2020   HCT 29.7 (L) 11/20/2020   MCV 68.3 (L) 11/20/2020   PLT 178 11/20/2020   NEUTROABS 5.2 11/20/2020    CMP  Lab Results  Component Value Date   NA 133 (L) 11/20/2020   K 3.6 11/20/2020   CL 100 11/20/2020   CO2 29 11/20/2020   GLUCOSE 95 11/20/2020   BUN <5 (L) 11/20/2020   CREATININE 0.37 (L) 11/20/2020   CALCIUM 8.2 (L) 11/20/2020   PROT 6.2 (L) 11/18/2020   ALBUMIN 3.7 11/18/2020   AST 23 11/18/2020   ALT 14 11/18/2020   ALKPHOS 244 (H) 11/18/2020   BILITOT 1.0 11/18/2020   GFRNONAA >60 11/20/2020   GFRAA >60 04/10/2020    Lab Results  Component Value Date   CEA1 6.57 (H) 11/05/2020    Medications: I have reviewed the patient's current medications.   Assessment/Plan: 1. Rectal cancer-rectal mass noted  on digital exam 02/10/2018, colonoscopy confirmed a him my circumferential mass in the rectum ? 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 CTs 02/10/2018-anterior rectal wall thickening, pulmonary nodules measuring up to 9 mm concerning for metastases, few round perirectal lymph nodes ? Pelvic MRI 02/20/2018-hypermetabolic low rectal mass extending to the posterior vagina with 2 small enlarged perirectal lymph nodes, T4b,N1-2.3 cm from the anal verge ? PET scan 02/23/2018-hypermetabolic rectal mass, 8 mm hypermetabolic lingular nodule, scattered small bilateral lung nodules, some calcified, a few with mildly increased activity ? Cycle 1 FOLFOXIRI8/21/2019 ? Cycle 5 FOLFOXIRI10/16/2019 ? CTs 05/01/2018-significant interval response to therapy with decreased size of the primary rectal mass lesion. Decreasing perirectal lymphadenopathy. Decreased and/or resolved pulmonary nodules. No new sites of disease identified. ? Cycle 6 FOLFOXIRI10/31/2019 ? Radiation/Xeloda 06/12/2018-completed 07/21/2018 ? Xeloda dose reduced 07/10/2018 due to mucositis and diarrhea ? CTs 08/07/2018-compared to 02/10/2018-resolved and decreased pulmonary nodules, few tiny residual noncalcified nodules, decreased 20 perirectal lymph nodes, soft tissue of the rectum indistinguishable from posterior wall of vagina-Vaginal involvement by tumor? ? 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  CT chest 09/29/2018-enlargement of lung nodules, not a candidate for SBRT based on  discussion in GI tumor conference and with radiation oncology ? CT chest 11/27/18 - interval growth of numerous pulmonary metastases bilaterally compared to 09/29/18, no new pulmonary mets ? Cycle 1FOLFOXIRI 11/29/18 ? Cycle 2 FOLFOXIRI6/10/2018 ? Cycle 3 FOLFOXIRI 12/28/2018 ? Cycle 4 FOLFIRINOX 01/11/2019 ? CTs 01/24/2019-significant improvement of bilateral pulmonary metastases. ? Cycle 1 FOLFIRI 02/01/2019 ? Cycle 2 FOLFIRI 02/22/2019 ? Cycle 3 FOLFIRI 03/15/2019 ? Cycle 4 FOLFIRI 04/05/2019 ? Cycle 5 FOLFIRI 04/26/2019 ? CTs 05/14/2019-slight enlargement of right lung nodules, no other evidence of disease progression ? Cycle 6 FOLFIRI 05/17/2019 ? Cycle 7 FOLFIRI plus Avastin 06/13/2019 ? Cycle 8 FOLFIRI plus Avastin 07/09/2019 ? Cycle 9 FOLFIRI plus Avastin 07/25/2019 ? Cycle 10 FOLFIRI plus Avastin 08/09/2019 ? Cycle 11 FOLFIRI plus Avastin 08/22/2019 ? CTs 09/07/2019-most pulmonary nodules are stable. 1 nodule right lower lobe slightly larger. No new lung lesions. ? Cycle 12 FOLFIRI plus Avastin 09/13/2019 ? Cycle 13 FOLFIRI plus Avastin 10/04/2019 ? Cycle 14 FOLFIRI plus Avastin 10/25/2019 ? Cycle 15 FOLFIRI plus Avastin 11/14/2019 ? Cycle 16 FOLFIRI plus Avastin 12/06/2019 ? CTs neck, chest, abdomen, pelvis 12/21/2019-neck negative; mildly progressive pulmonary metastases; abdomen and pelvis negative for evidence of metastatic disease. ? Cycle 1 FOLFOX/Avastin 12/27/2019 ? Cycle 2 FOLFOX/Avastin 01/17/2020 ? Cycle 3 FOLFOX/Avastin 02/07/2020 ? Cycle 4 FOLFOX 02/28/2020, Avastin held due to need for dental evaluation possible extractions ? Cycle 5 FOLFOX 03/20/2020, Avastin held pending dental evaluation ? CTs 04/04/2020-increased size of pulmonary nodules, no evidence of metastatic disease in the abdomen or pelvis ? Cycle 6 FOLFOX 05/22/2020, Avastin held secondary to dental extractions ? Cycle 7 FOLFOX/Avastin 06/12/2020 ? Cycle 8 FOLFOX/Avastin 06/30/2020 ? Cycle 9 FOLFOX/Avastin 07/24/2020 ? Cycle  10 FOLFOX/Avastin 08/14/2020 ? CTs 09/01/2020-improvement in bilateral pulmonary metastases. ? Cycle 11 FOLFOX/Avastin 09/04/2020 ? Cycle 12 FOLFOX/Avastin 09/25/2020 ? Cycle 13 FOLFOX/Avastin 10/15/2020 ? Cycle 14 FOLFOX/Avastin 11/05/2020 ? 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 Imodium as needed. 6.Mucositis secondary to Xeloda. Improved 07/19/2018. 7. 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  Admission 11/18/2020 with nausea/vomiting, fever, and abdominal pain-CT consistent with enteritis/colitis 12.  Admission 11/26/2020 with nausea, abdominal pain, loose stool, and dehydration    Disposition: Tina Morales has metastatic rectal cancer.  She was admitted last week with acute onset nausea/vomiting, fever, and loose stool.  A CT revealed evidence of "colitis ".  Her clinical status improved with hydration and antibiotics in the hospital.  She completed an outpatient course of Augmentin yesterday. She has persistent abdominal pain, nausea, loose stool, and she reports a low-grade fever.  The etiology of her symptoms are unclear.  She appears dehydrated today.  I contacted the hospitalist service to arrange for hospital admission.  We will submit a stool sample for C. difficile testing.  I think it is unlikely her symptoms are related to chemotherapy.  There was no clear evidence of tumor progression on the abdomen/pelvis CT last  week.  I recommend a GI consult to  evaluate her symptoms and the CT findings.  We will begin intravenous hydration and give a dose of pain medication at the Cancer center while she waits on the hospital bed.Betsy Coder, MD  11/26/2020  9:40 AM

## 2020-11-27 DIAGNOSIS — C7801 Secondary malignant neoplasm of right lung: Secondary | ICD-10-CM

## 2020-11-27 DIAGNOSIS — I10 Essential (primary) hypertension: Secondary | ICD-10-CM

## 2020-11-27 DIAGNOSIS — C20 Malignant neoplasm of rectum: Secondary | ICD-10-CM

## 2020-11-27 DIAGNOSIS — T451X5A Adverse effect of antineoplastic and immunosuppressive drugs, initial encounter: Secondary | ICD-10-CM

## 2020-11-27 DIAGNOSIS — E860 Dehydration: Secondary | ICD-10-CM

## 2020-11-27 DIAGNOSIS — R109 Unspecified abdominal pain: Secondary | ICD-10-CM | POA: Diagnosis not present

## 2020-11-27 DIAGNOSIS — C7802 Secondary malignant neoplasm of left lung: Secondary | ICD-10-CM

## 2020-11-27 DIAGNOSIS — R197 Diarrhea, unspecified: Secondary | ICD-10-CM | POA: Diagnosis not present

## 2020-11-27 DIAGNOSIS — D638 Anemia in other chronic diseases classified elsewhere: Secondary | ICD-10-CM

## 2020-11-27 DIAGNOSIS — R112 Nausea with vomiting, unspecified: Secondary | ICD-10-CM | POA: Diagnosis not present

## 2020-11-27 DIAGNOSIS — K529 Noninfective gastroenteritis and colitis, unspecified: Principal | ICD-10-CM

## 2020-11-27 DIAGNOSIS — C7800 Secondary malignant neoplasm of unspecified lung: Secondary | ICD-10-CM

## 2020-11-27 DIAGNOSIS — G620 Drug-induced polyneuropathy: Secondary | ICD-10-CM

## 2020-11-27 LAB — GASTROINTESTINAL PANEL BY PCR, STOOL (REPLACES STOOL CULTURE)

## 2020-11-27 LAB — BASIC METABOLIC PANEL
Anion gap: 9 (ref 5–15)
BUN: 6 mg/dL (ref 6–20)
CO2: 24 mmol/L (ref 22–32)
Calcium: 8.1 mg/dL — ABNORMAL LOW (ref 8.9–10.3)
Chloride: 99 mmol/L (ref 98–111)
Creatinine, Ser: <0.3 mg/dL — ABNORMAL LOW (ref 0.44–1.00)
Glucose, Bld: 91 mg/dL (ref 70–99)
Potassium: 4.2 mmol/L (ref 3.5–5.1)
Sodium: 132 mmol/L — ABNORMAL LOW (ref 135–145)

## 2020-11-27 LAB — MAGNESIUM: Magnesium: 2.1 mg/dL (ref 1.7–2.4)

## 2020-11-27 LAB — PHOSPHORUS: Phosphorus: 3.6 mg/dL (ref 2.5–4.6)

## 2020-11-27 MED ORDER — CHLORHEXIDINE GLUCONATE CLOTH 2 % EX PADS
6.0000 | MEDICATED_PAD | Freq: Every day | CUTANEOUS | Status: DC
  Administered 2020-11-27 – 2020-12-05 (×8): 6 via TOPICAL

## 2020-11-27 MED ORDER — HYDROMORPHONE HCL 1 MG/ML IJ SOLN
1.0000 mg | INTRAMUSCULAR | Status: DC | PRN
  Administered 2020-11-27 – 2020-11-29 (×10): 1 mg via INTRAVENOUS
  Filled 2020-11-27 (×10): qty 1

## 2020-11-27 MED ORDER — DEXTROSE-NACL 5-0.9 % IV SOLN: INTRAVENOUS | Status: DC

## 2020-11-27 MED ORDER — PANTOPRAZOLE SODIUM 40 MG PO TBEC
40.0000 mg | DELAYED_RELEASE_TABLET | Freq: Every day | ORAL | Status: DC
  Administered 2020-11-28 – 2020-12-06 (×9): 40 mg via ORAL
  Filled 2020-11-27 (×9): qty 1

## 2020-11-27 MED ORDER — ACETAMINOPHEN 325 MG PO TABS
650.0000 mg | ORAL_TABLET | Freq: Four times a day (QID) | ORAL | Status: DC | PRN
  Administered 2020-11-27: 650 mg via ORAL
  Filled 2020-11-27: qty 2

## 2020-11-27 MED ORDER — ALPRAZOLAM 0.5 MG PO TABS
0.5000 mg | ORAL_TABLET | Freq: Three times a day (TID) | ORAL | Status: DC | PRN
  Administered 2020-11-27: 0.5 mg via ORAL
  Filled 2020-11-27: qty 1

## 2020-11-27 NOTE — Progress Notes (Signed)
Initial Nutrition Assessment  INTERVENTION:   -Boost Breeze po TID, each supplement provides 250 kcal and 9 grams of protein  -Ensure Enlive po BID, each supplement provides 350 kcal and 20 grams of protein  NUTRITION DIAGNOSIS:   Increased nutrient needs related to cancer and cancer related treatments as evidenced by estimated needs.  GOAL:   Patient will meet greater than or equal to 90% of their needs  MONITOR:   PO intake,Supplement acceptance,Labs,Weight trends,I & O's  REASON FOR ASSESSMENT:   Malnutrition Screening Tool    ASSESSMENT:   55 y.o. female with medical history significant for rectal cancer, history of diarrhea and rectal pain secondary to #1, tobacco use disorder, iron deficiency anemia, migraines, essential hypertension, polyneuropathy, who was recently admitted on 11/18/2020 for the same.  At that time CT was consistent with enteritis/colitis.  Her clinical status improved with hydration and antibiotics in the hospital.  She completed an outpatient course of Augmentin on 11/25/2020.  She presented again today for the same complaints.  Due to worsening diarrhea,nausea and vomiting with concern for dehydration she was advised to come to the ED from oncology center for further evaluation.  Patient familiar to RD from previous admission. Pt continues to have diarrhea and nausea. C.diff negative. Currently on clear liquids, has Boost Breeze and Ensure supplements ordered. Is drinking the [employer]. Last chemotherapy was 4/27.  Per chart review, GI to see patient.  No significant weight changes per weight records. Current weight: 133 lbs.  Medications: MAG-OX, KLOR-CON, Lactated ringers, Phenergan  Labs reviewed: Low Na  NUTRITION - FOCUSED PHYSICAL EXAM:  Flowsheet Row Most Recent Value  Orbital Region No depletion  Upper Arm Region No depletion  Thoracic and Lumbar Region Unable to assess  Buccal Region No depletion  Temple Region No depletion   Clavicle Bone Region Mild depletion  Clavicle and Acromion Bone Region Mild depletion  Scapular Bone Region No depletion  Dorsal Hand No depletion  Patellar Region Unable to assess  Anterior Thigh Region Unable to assess  Posterior Calf Region Unable to assess  Edema (RD Assessment) None       Diet Order:   Diet Order            Diet clear liquid Room service appropriate? Yes; Fluid consistency: Thin  Diet effective now                 EDUCATION NEEDS:   No education needs have been identified at this time  Skin:  Skin Assessment: Reviewed RN Assessment  Last BM:  5/18- colostomy  Height:   Ht Readings from Last 1 Encounters:  11/26/20 5\' 8"  (1.727 m)    Weight:   Wt Readings from Last 1 Encounters:  11/27/20 60.5 kg    BMI:  Body mass index is 20.28 kg/m.  Estimated Nutritional Needs:   Kcal:  1800-2000  Protein:  90-105g  Fluid:  2L/day  Clayton Bibles, MS, RD, LDN Inpatient Clinical Dietitian Contact information available via Amion

## 2020-11-27 NOTE — Progress Notes (Addendum)
HEMATOLOGY-ONCOLOGY PROGRESS NOTE  SUBJECTIVE: Tina Morales is followed by our office for rectal cancer.  She has been receiving treatment with FOLFOX/Avastin.  Last cycle was given on 11/05/2020.  The patient was recently in the hospital and discharged on 11/20/2020 for nausea/vomiting, fever, diarrhea.  During that admission, she had a CT of the abdomen/pelvis concerning for colitis and enteritis.  No obstruction was seen on the CT scan.  She was seen for follow-up in our office yesterday and reported persistent nausea but no vomiting since discharge from the hospital.  She also reported low-grade fever and increased abdominal pain.  She was also having loose stool and emptying her ostomy bag 4-5 times per day.  This morning, she is resting quietly but has had persistent abdominal discomfort and nausea.  Has ongoing loose stools.  Stool for C. difficile negative and GI panel is currently pending.  Oncology History  Rectal cancer ([place])  04/27/2018 Initial Diagnosis   Rectal cancer ([place])   05/11/2018 -  Chemotherapy    Patient is on Treatment Plan: COLORECTAL FOLFOX + ZIRABEV Q 21 DAYS      06/13/2019 - 10/15/2020 Chemotherapy    Patient is on Treatment Plan: COLORECTAL FOLFOX + ZIRABEV Q 21 DAYS        PHYSICAL EXAMINATION:  Vitals:   11/26/20 2301 11/27/20 0426  BP: (!) 143/90 122/90  Pulse: (!) 117 (!) 114  Resp: 16 16  Temp: 98 F (36.7 C) 97.6 F (36.4 C)  SpO2: 92% 91%   Filed Weights   11/27/20 0426  Weight: 60.5 kg    Intake/Output from previous day: 05/18 0701 - 05/19 0700 In: 1316.7 [I.V.:1216.7; IV Piggyback:100] Out: -   GENERAL:alert, no distress and comfortable OROPHARYNX:no exudate, no erythema and lips, buccal mucosa, and tongue normal  LUNGS: clear to auscultation and percussion with normal breathing effort HEART: Regular, tachycardia, no lower extremity edema ABDOMEN: Mildly distended with hypoactive bowel sounds, diffuse tenderness, left lower quadrant  colostomy with small amount of liquid brown stool  NEURO: alert & oriented x 3 with fluent speech, no focal motor/sensory deficits  Port-A-Cath without erythema  LABORATORY DATA:  I have reviewed the data as listed CMP Latest Ref Rng & Units 11/27/2020 11/26/2020 11/20/2020  Glucose 70 - 99 mg/dL 91 121(H) 95  BUN 6 - 20 mg/dL 6 7 <5(L)  Creatinine 0.44 - 1.00 mg/dL <0.30(L) 0.38(L) 0.37(L)  Sodium 135 - 145 mmol/L 132(L) 132(L) 133(L)  Potassium 3.5 - 5.1 mmol/L 4.2 3.6 3.6  Chloride 98 - 111 mmol/L 99 96(L) 100  CO2 22 - 32 mmol/L _0 Calcium 8.9 - 10.3 mg/dL 8.1(L) 8.7(L) 8.2(L)  Total Protein 6.5 - 8.1 g/dL - 5.6(L) -  Total Bilirubin 0.3 - 1.2 mg/dL - 1.0 -  Alkaline Phos 38 - 126 U/L - 253(H) -  AST 15 - 41 U/L - 33 -  ALT 0 - 44 U/L - 10 -    Lab Results  Component Value Date   WBC 9.8 11/26/2020   HGB 11.4 (L) 11/26/2020   HCT 36.8 11/26/2020   MCV 69.6 (L) 11/26/2020   PLT 287 11/26/2020   NEUTROABS 7.8 (H) 11/26/2020    CT CHEST W CONTRAST  Result Date: 11/18/2020 CLINICAL DATA:  Abdominal pain for 1 week. History of colon cancer. On chemotherapy. Prior colostomy. EXAM: CT CHEST, ABDOMEN, AND PELVIS WITH CONTRAST TECHNIQUE: Multidetector CT imaging of the chest, abdomen and pelvis was performed following the standard protocol during bolus administration  of intravenous contrast. CONTRAST:  37m OMNIPAQUE IOHEXOL 300 MG/ML  SOLN COMPARISON:  CT 09/01/2020 FINDINGS: CT CHEST FINDINGS Cardiovascular: Port in the anterior chest wall with tip in distal SVC. Coronary artery calcification and aortic atherosclerotic calcification. Mediastinum/Nodes: No axillary or supraclavicular adenopathy. No mediastinal or hilar adenopathy. No pericardial fluid. Esophagus normal. Lungs/Pleura: Bilateral pulmonary nodules again demonstrated. Nodules appear very similar to CT 09/01/2020. Example nodule in the superior segment of the RIGHT lower lobe measures 9 mm (image 55) compared to 10 mm  on prior. Nodule in the RIGHT upper lobe measures 9 mm (image 57) compared with 9 mm. Nodule in the lateral aspect of the LEFT lower lobe measures 6 mm (image 85) compared with 5 mm. Peripheral LEFT upper lobe nodule measures 5 mm (image 67) compared with 6 mm. No new nodules are identified. Musculoskeletal: No aggressive osseous lesion. CT ABDOMEN AND PELVIS FINDINGS Hepatobiliary: No focal hepatic lesion. Small amount pericholecystic fluid is new from prior (image 16/series 2). No intrahepatic duct dilatation. Common bile duct normal caliber. Pancreas: Pancreas is normal. No ductal dilatation. No pancreatic inflammation. Spleen: Spleen normal Adrenals/urinary tract: Adrenal glands and kidneys are normal. The ureters and bladder normal. Stomach/Bowel: Stomach and duodenum normal. There is mild submucosal edema within the loops of the small bowel deep within the pelvis (image 107/series 602). The small amount of fluid within the pelvis which is similar prior. There is fluid stool throughout the colon. The distal colon leading up to the ostomy demonstrates sub mucosal edema and mucosal enhancement (image 90/62 for example). There is mucosal enhancement through the colon exiting the ostomy. No evidence of local recurrence in the proctectomy site. There is no evidence of bowel obstruction. Vascular/Lymphatic: Abdominal aorta is normal caliber with atherosclerotic calcification. There is no retroperitoneal or periportal lymphadenopathy. No pelvic lymphadenopathy. Reproductive: Post hysterectomy.  Adnexa unremarkable Other: Along the posterior margin of the RIGHT hepatic lobe there is peritoneal thickening with potential enhancement. Small amount fluid at this site on comparison exam however the enhancement is new (image 63/62). Nodular enhancement measures 10 mm x 7 mm image 64/602). Nodularity also seen on coronal image 24/605) Musculoskeletal: No aggressive osseous lesion. IMPRESSION: Chest Impression: 1. Stable  bilateral pulmonary nodular metastasis. 2. No mediastinal lymphadenopathy. Abdomen / Pelvis Impression: 1. Submucosal edema and mucosal enhancement involving the distal colon leading up to the colostomy as well as loops of small bowel in the pelvis. Findings are most consistent with colitis and enteritis. No high-grade obstruction. a 2. Concern for subtle peritoneal nodular enhancement along the posterior aspect the RIGHT hepatic lobe. Recommend close attention on follow-up with CT scan or FDG PET scan. These results will be called to the ordering clinician or representative by the Radiologist Assistant, and communication documented in the PACS or [employer] Electronically Signed   By: SSuzy BouchardM.D.   On: 11/18/2020 16:42   CT Abdomen Pelvis W Contrast  Result Date: 11/18/2020 CLINICAL DATA:  Abdominal pain for 1 week. History of colon cancer. On chemotherapy. Prior colostomy. EXAM: CT CHEST, ABDOMEN, AND PELVIS WITH CONTRAST TECHNIQUE: Multidetector CT imaging of the chest, abdomen and pelvis was performed following the standard protocol during bolus administration of intravenous contrast. CONTRAST:  750mOMNIPAQUE IOHEXOL 300 MG/ML  SOLN COMPARISON:  CT 09/01/2020 FINDINGS: CT CHEST FINDINGS Cardiovascular: Port in the anterior chest wall with tip in distal SVC. Coronary artery calcification and aortic atherosclerotic calcification. Mediastinum/Nodes: No axillary or supraclavicular adenopathy. No mediastinal or hilar adenopathy. No pericardial fluid.  Esophagus normal. Lungs/Pleura: Bilateral pulmonary nodules again demonstrated. Nodules appear very similar to CT 09/01/2020. Example nodule in the superior segment of the RIGHT lower lobe measures 9 mm (image 55) compared to 10 mm on prior. Nodule in the RIGHT upper lobe measures 9 mm (image 57) compared with 9 mm. Nodule in the lateral aspect of the LEFT lower lobe measures 6 mm (image 85) compared with 5 mm. Peripheral LEFT upper lobe nodule  measures 5 mm (image 67) compared with 6 mm. No new nodules are identified. Musculoskeletal: No aggressive osseous lesion. CT ABDOMEN AND PELVIS FINDINGS Hepatobiliary: No focal hepatic lesion. Small amount pericholecystic fluid is new from prior (image 16/series 2). No intrahepatic duct dilatation. Common bile duct normal caliber. Pancreas: Pancreas is normal. No ductal dilatation. No pancreatic inflammation. Spleen: Spleen normal Adrenals/urinary tract: Adrenal glands and kidneys are normal. The ureters and bladder normal. Stomach/Bowel: Stomach and duodenum normal. There is mild submucosal edema within the loops of the small bowel deep within the pelvis (image 107/series 602). The small amount of fluid within the pelvis which is similar prior. There is fluid stool throughout the colon. The distal colon leading up to the ostomy demonstrates sub mucosal edema and mucosal enhancement (image 90/62 for example). There is mucosal enhancement through the colon exiting the ostomy. No evidence of local recurrence in the proctectomy site. There is no evidence of bowel obstruction. Vascular/Lymphatic: Abdominal aorta is normal caliber with atherosclerotic calcification. There is no retroperitoneal or periportal lymphadenopathy. No pelvic lymphadenopathy. Reproductive: Post hysterectomy.  Adnexa unremarkable Other: Along the posterior margin of the RIGHT hepatic lobe there is peritoneal thickening with potential enhancement. Small amount fluid at this site on comparison exam however the enhancement is new (image 63/62). Nodular enhancement measures 10 mm x 7 mm image 64/602). Nodularity also seen on coronal image 24/605) Musculoskeletal: No aggressive osseous lesion. IMPRESSION: Chest Impression: 1. Stable bilateral pulmonary nodular metastasis. 2. No mediastinal lymphadenopathy. Abdomen / Pelvis Impression: 1. Submucosal edema and mucosal enhancement involving the distal colon leading up to the colostomy as well as loops of  small bowel in the pelvis. Findings are most consistent with colitis and enteritis. No high-grade obstruction. a 2. Concern for subtle peritoneal nodular enhancement along the posterior aspect the RIGHT hepatic lobe. Recommend close attention on follow-up with CT scan or FDG PET scan. These results will be called to the ordering clinician or representative by the Radiologist Assistant, and communication documented in the PACS or [employer]. Electronically Signed   By: Suzy Bouchard M.D.   On: 11/18/2020 16:42   DG Chest Port 1 View  Result Date: 11/18/2020 CLINICAL DATA:  Abdominal pain and vomiting for 1 week. History of colon cancer. EXAM: PORTABLE CHEST 1 VIEW COMPARISON:  CT chest, abdomen and pelvis 09/01/2020. FINDINGS: Right IJ approach Port-A-Cath is in place. The lungs are emphysematous. A few scattered pulmonary nodules consistent with known metastatic disease are seen. No consolidative process, pneumothorax or effusion. Heart size is normal. Aortic atherosclerosis. IMPRESSION: No acute disease. Pulmonary nodules consistent with known metastatic disease. Aortic Atherosclerosis (ICD10-I70.0) and Emphysema (ICD10-J43.9). Electronically Signed   By: Inge Rise M.D.   On: 11/18/2020 14:02    ASSESSMENT AND PLAN: 1. Rectal cancer-rectal mass noted on digital exam 02/10/2018, colonoscopy confirmed a him my circumferential mass in the rectum ? Biopsy 02/10/2018-tubular adenoma with at least high-grade dysplasia but no definitive evidence of invasion, pathology review at digestive health specialist-intramucosal adenocarcinoma (at least), arising in high-grade dysplasia, no loss  of mismatch repair protein expression ? CTs 02/10/2018-anterior rectal wall thickening, pulmonary nodules measuring up to 9 mm concerning for metastases, few round perirectal lymph nodes ? Pelvic MRI 02/20/2018-hypermetabolic low rectal mass extending to the posterior vagina with 2 small enlarged perirectal lymph  nodes, T4b,N1-2.3 cm from the anal verge ? PET scan 02/23/2018-hypermetabolic rectal mass, 8 mm hypermetabolic lingular nodule, scattered small bilateral lung nodules, some calcified, a few with mildly increased activity ? Cycle 1 FOLFOXIRI8/21/2019 ? Cycle 5 FOLFOXIRI10/16/2019 ? CTs 05/01/2018-significant interval response to therapy with decreased size of the primary rectal mass lesion. Decreasing perirectal lymphadenopathy. Decreased and/or resolved pulmonary nodules. No new sites of disease identified. ? Cycle 6 FOLFOXIRI10/31/2019 ? Radiation/Xeloda 06/12/2018-completed 07/21/2018 ? Xeloda dose reduced 07/10/2018 due to mucositis and diarrhea ? CTs 08/07/2018-compared to 02/10/2018-resolved and decreased pulmonary nodules, few tiny residual noncalcified nodules, decreased 20 perirectal lymph nodes, soft tissue of the rectum indistinguishable from posterior wall of vagina-Vaginal involvement by tumor? ? 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 CT chest 09/29/2018-enlargement of lung nodules, not a candidate for SBRT based on discussion in GI tumor conference and with radiation oncology ? CT chest 11/27/18 - interval growth of numerous pulmonary metastases bilaterally compared to 09/29/18, no new pulmonary mets ? Cycle 1FOLFOXIRI 11/29/18 ? Cycle 2 FOLFOXIRI6/10/2018 ? Cycle 3 FOLFOXIRI 12/28/2018 ? Cycle 4 FOLFIRINOX 01/11/2019 ? CTs 01/24/2019-significant improvement of bilateral pulmonary metastases. ? Cycle 1 FOLFIRI 02/01/2019 ? Cycle 2 FOLFIRI 02/22/2019 ? Cycle 3 FOLFIRI 03/15/2019 ? Cycle 4 FOLFIRI 04/05/2019 ? Cycle 5 FOLFIRI 04/26/2019 ? CTs 05/14/2019-slight enlargement of right lung nodules, no other evidence of disease progression ? Cycle 6 FOLFIRI  05/17/2019 ? Cycle 7 FOLFIRI plus Avastin 06/13/2019 ? Cycle 8 FOLFIRI plus Avastin 07/09/2019 ? Cycle 9 FOLFIRI plus Avastin 07/25/2019 ? Cycle 10 FOLFIRI plus Avastin 08/09/2019 ? Cycle 11 FOLFIRI plus Avastin 08/22/2019 ? CTs 09/07/2019-most pulmonary nodules are stable. 1 nodule right lower lobe slightly larger. No new lung lesions. ? Cycle 12 FOLFIRI plus Avastin 09/13/2019 ? Cycle 13 FOLFIRI plus Avastin 10/04/2019 ? Cycle 14 FOLFIRI plus Avastin 10/25/2019 ? Cycle 15 FOLFIRI plus Avastin 11/14/2019 ? Cycle 16 FOLFIRI plus Avastin 12/06/2019 ? CTs neck, chest, abdomen, pelvis 12/21/2019-neck negative; mildly progressive pulmonary metastases; abdomen and pelvis negative for evidence of metastatic disease. ? Cycle 1 FOLFOX/Avastin 12/27/2019 ? Cycle 2 FOLFOX/Avastin 01/17/2020 ? Cycle 3 FOLFOX/Avastin 02/07/2020 ? Cycle 4 FOLFOX 02/28/2020, Avastin held due to need for dental evaluation possible extractions ? Cycle 5 FOLFOX 03/20/2020, Avastin held pending dental evaluation ? CTs 04/04/2020-increased size of pulmonary nodules, no evidence of metastatic disease in the abdomen or pelvis ? Cycle 6 FOLFOX 05/22/2020, Avastin held secondary to dental extractions ? Cycle 7 FOLFOX/Avastin 06/12/2020 ? Cycle 8 FOLFOX/Avastin 06/30/2020 ? Cycle 9 FOLFOX/Avastin 07/24/2020 ? Cycle 10 FOLFOX/Avastin 08/14/2020 ? CTs 09/01/2020-improvement in bilateral pulmonary metastases. ? Cycle 11 FOLFOX/Avastin 09/04/2020 ? Cycle 12 FOLFOX/Avastin 09/25/2020 ? Cycle 13 FOLFOX/Avastin 10/15/2020 ? Cycle 14 FOLFOX/Avastin 11/05/2020 ? CTs 11/18/2020-stable lung nodules, no focal hepatic lesion, small amount of new pericholecystic fluid, mild submucosal edema and small bowel loops within the pelvis, fluid stool throughout the colon with submucosal edema and enhancement in the distal colon, new nodular enhancement at the posterior margin of the right liver  2.History of diarrhea and rectal pain secondary to #1 3.History of tobacco  use  4.Anemia secondary to thalassemia, rectal bleeding, and potentially iron deficiency 5.Diarrhea secondary to Xeloda and radiation. Imodium as needed. 6.Mucositis secondary to Xeloda. Improved 07/19/2018. 7. 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 Admission 11/18/2020 with nausea/vomiting, fever, and abdominal pain-CT consistent with enteritis/colitis 12.  Admission 11/26/2020 with nausea, abdominal pain, loose stool, and dehydration 13.  Hospital admission 11/26/2020- nausea and diarrhea  Tina Morales is admitted for persistent nausea and diarrhea and abdominal discomfort.  She continues to have ongoing nausea and Phenergan has been effective.  She is taking hydrocodone for pain.  The stool for C. difficile is negative and GI panel is currently pending.  She has Lomotil ordered as needed for her diarrhea.  Etiology of her symptoms remains unclear.  Recommend GI consult to evaluate her symptoms and prior CT scan findings.  She continues on IV fluids for dehydration.  Recommendations: 1.  Continue current pain medications, antiemetics, and antidiarrheals. 2.  GI consult to evaluate her symptoms and CT scan findings. 3.  Continue IV fluids 4.  Consider repeat abdominal imaging   LOS: 1 day   Mikey Bussing, DNP, AGPCNP-BC, AOCNP 11/27/20 Tina Morales was interviewed and examined.  She has persistent abdominal pain and nausea.  The C. difficile returned negative.  I doubt her symptoms are related to "colitis "from chemotherapy.  I am concerned the abdominal pain could be related to unrecognized carcinomatosis or an early bowel obstruction.  She has small volume liquid stool.  Gastroenterology has been consulted to evaluate her symptoms.  We will plan to resume systemic therapy for the rectal cancer depending on resolution of recurrent symptoms  I was present for greater than 50% of today's visit.  I performed  medical decision making.

## 2020-11-27 NOTE — Progress Notes (Addendum)
PROGRESS NOTE    Tina Morales  XBM:[id number] DOB: 10/10/1965 DOA: 11/26/2020 PCP: Ladell Pier, MD    Brief Narrative:  Ms. Burgner was admitted to the hospital working diagnosis of worsening diarrhea and abdominal pain.  55 year old female past medical history for rectal cancer, tobacco abuse, iron deficiency anemia, migraines, hypertension and polyneuropathy who presented with nausea vomiting and diarrhea.  Recent hospitalization for enteritis/colitis, from 05/10 to 5/12.  Completed course of antibiotic therapy with Augmentin on 5/17.  At home she had recurrent gastrointestinal symptoms, that prompted her to come back to the hospital.  On her initial physical examination she was hemodynamically stable, her lungs are clear to auscultation bilaterally, heart S1-S2, present, rhythmic, her abdomen was soft, nontender nondistended, left lower quadrant colostomy bag present, no lower extremity edema.  Sodium 132, potassium 3.6, chloride 96, bicarb 27, glucose 121, BUN 7, creatinine 0.33, white count 9.8, hemoglobin 9.4, hematocrit 36.8, platelets 287.  Assessment & Plan:   Principal Problem:   Colitis Active Problems:   Rectal cancer ([place])   Anemia of chronic disease   Metastatic cancer to lung ([place])   Diarrhea   1. Enteritis/ colitis, patient continue to have abdominal pain and nausea, no vomiting or diarrhea.  GI panel negative including C diff,  Change morphine to hydromorphone for pain control, continue with lomotil and compazine as needed.  Advance diet to soft from clears.  Continue IV fluids with isotonic saline with dextrose. Further work up with KUB to rule out obstruction, if persistent symptoms may need repeat CT abdomen and pelvis.  Add proton pump inhibitor.  2. Hypokalemia/ hyponatremia. Stable renal function with serum cr at 0,30, K is 4,2 and Na is 132. Continue to follow up renal function in am, avoid hypotension and nephrotoxic medications.   Change LR to NS.   3.  Rectal cancer. Sp abdominal perineal resection and vaginectomy on 2020.  Follows with the Oncology clinic Dr Benay Spice as outpatient.   4. GERD continue with pantoprazole,   5. Anxiety. Continue with as needed alprazolam.      Patient continue to be at high risk for worsening colitis.   Status is: Inpatient  Remains inpatient appropriate because:IV treatments appropriate due to intensity of illness or inability to take PO   Dispo: The patient is from: Home              Anticipated d/c is to: Home              Patient currently is not medically stable to d/c.   Difficult to place patient No   DVT prophylaxis: Enoxaparin   Code Status:   full  Family Communication:  No family at the bedside      Nutrition Status: Nutrition Problem: Increased nutrient needs Etiology: cancer and cancer related treatments Signs/Symptoms: estimated needs Interventions: Boost Breeze,Ensure Enlive (each supplement provides 350kcal and 20 grams of protein)     Consultants:   GI   Oncology      Subjective: Patient continue to have abdominal pain, moderate to severe in intensity, colicky, with nausea but not vomiting, or diarrhea, improved with analgesics, no worsening factors.   Objective: Vitals:   11/26/20 1925 11/26/20 2301 11/27/20 0426 11/27/20 1029  BP: (!) 144/96 (!) 143/90 122/90 131/85  Pulse: (!) 123 (!) 117 (!) 114 (!) 128  Resp:  16 16 19   Temp: 97.8 F (36.6 C) 98 F (36.7 C) 97.6 F (36.4 C) 98.8 F (37.1 C)  TempSrc: Oral Oral  Oral Oral  SpO2: 97% 92% 91% 95%  Weight:   60.5 kg     Intake/Output Summary (Last 24 hours) at 11/27/2020 1403 Last data filed at 11/27/2020 0600 Gross per 24 hour  Intake 1316.72 ml  Output --  Net 1316.72 ml   Filed Weights   11/27/20 0426  Weight: 60.5 kg    Examination:   General: Not in pain or dyspnea  Neurology: Awake and alert, non focal  E ENT: mild pallor, no icterus, oral mucosa moist Cardiovascular: No JVD. S1-S2  present, rhythmic, no gallops, rubs, or murmurs. No lower extremity edema. Pulmonary: positive breath sounds bilaterally,with no wheezing, rhonchi or rales. Gastrointestinal. Abdomen distended and tympanic, tender to deep palpation, no rebound.  Colostomy bag empty.  Skin. No rashes Musculoskeletal: no joint deformities     Data Reviewed: I have personally reviewed following labs and imaging studies  CBC: Recent Labs  Lab 11/26/20 0919  WBC 9.8  NEUTROABS 7.8*  HGB 11.4*  HCT 36.8  MCV 69.6*  PLT 295   Basic Metabolic Panel: Recent Labs  Lab 11/26/20 0919 11/27/20 0533  NA 132* 132*  K 3.6 4.2  CL 96* 99  CO2 27 24  GLUCOSE 121* 91  BUN 7 6  CREATININE 0.38* <0.30*  CALCIUM 8.7* 8.1*  MG  --  2.1  PHOS  --  3.6   GFR: CrCl cannot be calculated (This lab value cannot be used to calculate CrCl because it is not a number: <0.30). Liver Function Tests: Recent Labs  Lab 11/26/20 0919  AST 33  ALT 10  ALKPHOS 253*  BILITOT 1.0  PROT 5.6*  ALBUMIN 3.3*   No results for input(s): LIPASE, AMYLASE in the last 168 hours. No results for input(s): AMMONIA in the last 168 hours. Coagulation Profile: No results for input(s): INR, PROTIME in the last 168 hours. Cardiac Enzymes: No results for input(s): CKTOTAL, CKMB, CKMBINDEX, TROPONINI in the last 168 hours. BNP (last 3 results) No results for input(s): PROBNP in the last 8760 hours. HbA1C: No results for input(s): HGBA1C in the last 72 hours. CBG: No results for input(s): GLUCAP in the last 168 hours. Lipid Profile: No results for input(s): CHOL, HDL, LDLCALC, TRIG, CHOLHDL, LDLDIRECT in the last 72 hours. Thyroid Function Tests: No results for input(s): TSH, T4TOTAL, FREET4, T3FREE, THYROIDAB in the last 72 hours. Anemia Panel: No results for input(s): VITAMINB12, FOLATE, FERRITIN, TIBC, IRON, RETICCTPCT in the last 72 hours.    Radiology Studies: I have reviewed all of the imaging during this hospital  visit personally     Scheduled Meds: . ALPRAZolam  0.5 mg Oral QHS  . Chlorhexidine Gluconate Cloth  6 each Topical Daily  . enoxaparin (LOVENOX) injection  40 mg Subcutaneous Q24H  . feeding supplement  1 Container Oral TID BM  . feeding supplement  237 mL Oral BID BM  . magnesium oxide  400 mg Oral BID  . pantoprazole (PROTONIX) IV  40 mg Intravenous Q24H  . potassium chloride SA  20 mEq Oral BID   Continuous Infusions: . lactated ringers 100 mL/hr at 11/27/20 1325  . promethazine (PHENERGAN) injection (IM or IVPB) 12.5 mg (11/27/20 1209)     LOS: 1 day        Pio Eatherly Gerome Apley, MD

## 2020-11-27 NOTE — Consult Note (Signed)
Referring Provider: [facility] Primary Care Physician:  Ladell Pier, MD Primary Gastroenterologist:  Althia Forts  Reason for Consultation:  Diarrhea, nausea/vomiting  HPI: Tina Morales is a 55 y.o. 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  Patient presented to the ED due to worsening diarrhea and nausea.  She reports one episode of vomiting yesterday.  She has not had further vomiting but notes continued nausea.  Also reports lower abdominal pain, but states she has chronic abdominal pain.  Denies melena or rectal bleeding.  She has also had fever intermittently, the last of which occurring on 5/9.  Reports increased hiccups and states she has a history of GERD, for which she takes omeprazole.  States omeprazole works better than Protonix.  Denies dysphagia.  Patient was recently admitted 5/10-5/06-2021 with similar symptoms.  CT abdomen/pelvis 5/10 revealed Submucosal edema and mucosal enhancement involving the distal colon leading up to the colostomy as well as loops of small bowel in the pelvis. Findings are most consistent with colitis and enteritis.  She was treated emprically with Augmentin and discharged. She states she did not notice any improvement on Augmentin.   Past Medical History:  Diagnosis Date   Adenocarcinoma ([place]) 02/2018   Anxiety    Cancer ([place]) 02/10/2018   Stage 4 Colorectal Cancer, spot on her lungs   Migraine     Past Surgical History:  Procedure Laterality Date   ADENOIDECTOMY     CYSTOSCOPY W/ RETROGRADES Bilateral 09/01/2018   Procedure: CYSTOSCOPY WITH BILATERAL  RETROGRADE PYELOGRAM;  Surgeon: Alexis Frock, MD;  Location: [facility];  Service: Urology;  Laterality: Bilateral;   CYSTOSCOPY WITH STENT PLACEMENT Bilateral 09/01/2018   Procedure: CYSTOSCOPY WITH  URETERAL COOK CATHETER PLACEMENT;  Surgeon: Alexis Frock, MD;  Location: [facility];  Service: Urology;  Laterality: Bilateral;   IR CV LINE INJECTION  06/09/2020   ROBOTIC ASSISTED TOTAL HYSTERECTOMY WITH BILATERAL SALPINGO OOPHERECTOMY Bilateral 09/01/2018   Procedure: XI ROBOTIC ASSISTED PARTIAL VAGINECTOMY;  Surgeon: Everitt Amber, MD;  Location: [facility];  Service: Gynecology;  Laterality: Bilateral;   TONSILLECTOMY      Prior to Admission medications   Medication Sig Start Date End Date Taking? Authorizing Provider  acetaminophen (TYLENOL) 500 MG tablet Take 1,000 mg by mouth every 8 (eight) hours as needed (PAIN).    Yes [provider]  ALPRAZolam (XANAX) 0.5 MG tablet Take 1 tablet (0.5 mg total) by mouth 2 (two) times daily as needed for anxiety. Patient taking differently: Take 1 mg by mouth at bedtime. 08/14/20  Yes Ladell Pier, MD  bisacodyl (DULCOLAX) 5 MG EC tablet Take 10 mg by mouth daily as needed for moderate constipation. Takes for few days after each tx   Yes [provider]  dexamethasone (DECADRON) 4 MG tablet #2 tablets twice daily x 3 days. Start day after chemo treatment 11/05/20  Yes Ladell Pier, MD  diphenoxylate-atropine (LOMOTIL) 2.5-0.025 MG tablet Take 2 tablets by mouth 4 (four) times daily as needed for diarrhea or loose stools. 06/12/20  Yes Ladell Pier, MD  HYDROcodone-acetaminophen (NORCO) 5-325 MG tablet Take 1 tablet by mouth every 6 (six) hours as needed for moderate pain. 11/24/20  Yes Owens Shark, NP  lidocaine-prilocaine (EMLA) cream Apply 1 application topically as needed (port). 06/30/20  Yes Ladell Pier, MD  loratadine (CLARITIN) 10 MG  tablet Take 10 mg by mouth daily as needed for allergies.   Yes [provider]  Magnesium Oxide 250 MG TABS Take 500 mg by mouth 2 (two) times daily.   Yes [provider]  omeprazole (PRILOSEC) 20 MG capsule Take 20 mg by mouth at bedtime.   Yes [provider]   Polyethylene Glycol 3350 (MIRALAX PO) Take 17 g by mouth daily as needed (constipation).   Yes [provider]  potassium chloride SA (KLOR-CON) 20 MEQ tablet Take 1 tablet (20 mEq total) by mouth 2 (two) times daily. 11/05/20  Yes Ladell Pier, MD  prochlorperazine (COMPAZINE) 10 MG tablet Take 1 tablet (10 mg total) by mouth every 6 (six) hours as needed for nausea or vomiting. 06/12/20  Yes Ladell Pier, MD    Scheduled Meds:  ALPRAZolam  0.5 mg Oral QHS   Chlorhexidine Gluconate Cloth  6 each Topical Daily   dicyclomine  10 mg Oral TID AC & HS   enoxaparin (LOVENOX) injection  40 mg Subcutaneous Q24H   feeding supplement  1 Container Oral TID BM   feeding supplement  237 mL Oral BID BM   magnesium oxide  400 mg Oral BID   pantoprazole (PROTONIX) IV  40 mg Intravenous Q24H   potassium chloride SA  20 mEq Oral BID   Continuous Infusions:  lactated ringers 100 mL/hr at 11/27/20 0347   promethazine (PHENERGAN) injection (IM or IVPB) 12.5 mg (11/27/20 0800)   PRN Meds:.diphenoxylate-atropine, HYDROcodone-acetaminophen, loratadine, morphine injection, ondansetron (ZOFRAN) IV, prochlorperazine  Allergies as of 11/26/2020 - Review Complete 11/26/2020  Allergen Reaction Noted   Sulfa antibiotics  04/27/2018    Family History  Problem Relation Age of Onset   Breast cancer Maternal Aunt    Throat cancer Cousin    Cancer Cousin    Throat cancer Cousin    Lung cancer Paternal Uncle     Social History   Socioeconomic History   Marital status: Married    Spouse name: Not on file   Number of children: Not on file   Years of education: Not on file   Highest education level: Not on file  Occupational History   Not on file  Tobacco Use   Smoking status: Former Smoker    Years: 30.00    Quit date: 02/2018    Years since quitting: 2.8   Smokeless tobacco: Never Used  Vaping Use   Vaping Use: Never used  Substance and Sexual Activity   Alcohol use: Not Currently     Comment: per Dr Dema Severin chart, "no drink in the last 5 years"   Drug use: Never   Sexual activity: Not on file  Other Topics Concern   Not on file  Social History Narrative   Not on file   Social Determinants of Health   Financial Resource Strain: Not on file  Food Insecurity: Not on file  Transportation Needs: Not on file  Physical Activity: Not on file  Stress: Not on file  Social Connections: Not on file  Intimate Partner Violence: Not on file    Review of Systems: Review of Systems  Constitutional: Positive for chills and fever.  HENT: Negative for hearing loss and tinnitus.   Eyes: Negative for pain and redness.  Respiratory: Negative for cough and shortness of breath.   Cardiovascular: Negative for chest pain and palpitations.  Gastrointestinal: Positive for abdominal pain, diarrhea, heartburn, nausea and vomiting. Negative for blood in stool, constipation and melena.  Genitourinary:  Negative for flank pain and hematuria.  Musculoskeletal: Negative for falls and joint pain.  Skin: Negative for itching and rash.  Neurological: Negative for seizures and loss of consciousness.  Endo/Heme/Allergies: Negative for polydipsia. Does not bruise/bleed easily.  Psychiatric/Behavioral: Negative for substance abuse. The patient is not nervous/anxious.      Physical Exam: Vital signs: Vitals:   11/26/20 2301 11/27/20 0426  BP: (!) 143/90 122/90  Pulse: (!) 117 (!) 114  Resp: 16 16  Temp: 98 F (36.7 C) 97.6 F (36.4 C)  SpO2: 92% 91%   Last BM Date: 11/26/20  Physical Exam Vitals reviewed.  Constitutional:      General: She is not in acute distress. HENT:     Head: Normocephalic and atraumatic.     Nose: Nose normal. No congestion.     Mouth/Throat:     Mouth: Mucous membranes are moist.     Pharynx: Oropharynx is clear.  Eyes:     General: No scleral icterus.    Extraocular Movements: Extraocular movements intact.     Conjunctiva/sclera: Conjunctivae normal.   Cardiovascular:     Rate and Rhythm: Normal rate and regular rhythm.     Heart sounds: Normal heart sounds.  Pulmonary:     Effort: Pulmonary effort is normal. No respiratory distress.  Abdominal:     General: Bowel sounds are normal. There is no distension.     Palpations: Abdomen is soft. There is no mass.     Tenderness: There is abdominal tenderness (moderate, lower abdomen). There is no guarding or rebound.     Hernia: No hernia is present.     Comments: Ostomy: pink stoma, liquid brown stool  Musculoskeletal:        General: No swelling or tenderness.     Cervical back: Normal range of motion and neck supple.  Skin:    General: Skin is warm and dry.  Neurological:     General: No focal deficit present.     Mental Status: She is oriented to person, place, and time. She is lethargic.  Psychiatric:        Mood and Affect: Mood normal.        Behavior: Behavior normal. Behavior is cooperative.     GI:  Lab Results: Recent Labs    11/26/20 0919  WBC 9.8  HGB 11.4*  HCT 36.8  PLT 287   BMET Recent Labs    11/26/20 0919 11/27/20 0533  NA 132* 132*  K 3.6 4.2  CL 96* 99  CO2 27 24  GLUCOSE 121* 91  BUN 7 6  CREATININE 0.38* <0.30*  CALCIUM 8.7* 8.1*   LFT Recent Labs    11/26/20 0919  PROT 5.6*  ALBUMIN 3.3*  AST 33  ALT 10  ALKPHOS 253*  BILITOT 1.0   PT/INR No results for input(s): LABPROT, INR in the last 72 hours.   Studies/Results: No results found.  Impression: Diarrhea, nausea, vomiting, abdominal pain. Colitis and enteritis on CT imaging. Concerning for infectious etiology. 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  Continue supportive care.  Try Bentyl QID.  [facility] will follow.   LOS: 1 day   Salley Slaughter  PA-C 11/27/2020, 9:27 AM  Contact #  [phone]

## 2020-11-28 ENCOUNTER — Inpatient Hospital Stay (HOSPITAL_COMMUNITY): Payer: BC Managed Care – PPO

## 2020-11-28 ENCOUNTER — Telehealth: Payer: Self-pay | Admitting: *Deleted

## 2020-11-28 ENCOUNTER — Inpatient Hospital Stay: Payer: BC Managed Care – PPO

## 2020-11-28 DIAGNOSIS — R197 Diarrhea, unspecified: Secondary | ICD-10-CM | POA: Diagnosis not present

## 2020-11-28 DIAGNOSIS — K529 Noninfective gastroenteritis and colitis, unspecified: Secondary | ICD-10-CM | POA: Diagnosis not present

## 2020-11-28 DIAGNOSIS — R109 Unspecified abdominal pain: Secondary | ICD-10-CM | POA: Diagnosis not present

## 2020-11-28 DIAGNOSIS — C7800 Secondary malignant neoplasm of unspecified lung: Secondary | ICD-10-CM | POA: Diagnosis not present

## 2020-11-28 DIAGNOSIS — R112 Nausea with vomiting, unspecified: Secondary | ICD-10-CM | POA: Diagnosis not present

## 2020-11-28 DIAGNOSIS — E860 Dehydration: Secondary | ICD-10-CM | POA: Diagnosis not present

## 2020-11-28 DIAGNOSIS — D638 Anemia in other chronic diseases classified elsewhere: Secondary | ICD-10-CM | POA: Diagnosis not present

## 2020-11-28 LAB — CBC WITH DIFFERENTIAL/PLATELET
Abs Immature Granulocytes: 0.06 10*3/uL (ref 0.00–0.07)
Basophils Absolute: 0 10*3/uL (ref 0.0–0.1)
Basophils Relative: 0 %
Eosinophils Absolute: 0 10*3/uL (ref 0.0–0.5)
Eosinophils Relative: 0 %
HCT: 32.5 % — ABNORMAL LOW (ref 36.0–46.0)
Hemoglobin: 9.9 g/dL — ABNORMAL LOW (ref 12.0–15.0)
Immature Granulocytes: 1 %
Lymphocytes Relative: 9 %
Lymphs Abs: 0.9 10*3/uL (ref 0.7–4.0)
MCH: 21.3 pg — ABNORMAL LOW (ref 26.0–34.0)
MCHC: 30.5 g/dL (ref 30.0–36.0)
MCV: 70 fL — ABNORMAL LOW (ref 80.0–100.0)
Monocytes Absolute: 1.5 10*3/uL — ABNORMAL HIGH (ref 0.1–1.0)
Monocytes Relative: 15 %
Neutro Abs: 7.2 10*3/uL (ref 1.7–7.7)
Neutrophils Relative %: 75 %
Platelets: 215 10*3/uL (ref 150–400)
RBC: 4.64 MIL/uL (ref 3.87–5.11)
RDW: 20.9 % — ABNORMAL HIGH (ref 11.5–15.5)
WBC: 9.7 10*3/uL (ref 4.0–10.5)
nRBC: 0.3 % — ABNORMAL HIGH (ref 0.0–0.2)

## 2020-11-28 LAB — BASIC METABOLIC PANEL
Anion gap: 3 — ABNORMAL LOW (ref 5–15)
BUN: 7 mg/dL (ref 6–20)
CO2: 28 mmol/L (ref 22–32)
Calcium: 8.1 mg/dL — ABNORMAL LOW (ref 8.9–10.3)
Chloride: 104 mmol/L (ref 98–111)
Creatinine, Ser: <0.3 mg/dL — ABNORMAL LOW (ref 0.44–1.00)
Glucose, Bld: 92 mg/dL (ref 70–99)
Potassium: 4.3 mmol/L (ref 3.5–5.1)
Sodium: 135 mmol/L (ref 135–145)

## 2020-11-28 MED ORDER — ENSURE MAX PROTEIN PO LIQD
11.0000 [oz_av] | Freq: Two times a day (BID) | ORAL | Status: DC
  Administered 2020-11-29 – 2020-12-05 (×11): 11 [oz_av] via ORAL
  Filled 2020-11-28 (×16): qty 330

## 2020-11-28 MED ORDER — DICYCLOMINE HCL 10 MG PO CAPS
10.0000 mg | ORAL_CAPSULE | Freq: Three times a day (TID) | ORAL | Status: DC
  Administered 2020-11-28 – 2020-11-30 (×7): 10 mg via ORAL
  Filled 2020-11-28 (×7): qty 1

## 2020-11-28 MED ORDER — OXYCODONE HCL ER 10 MG PO T12A
10.0000 mg | EXTENDED_RELEASE_TABLET | Freq: Two times a day (BID) | ORAL | Status: DC
Start: 2020-11-28 — End: 2020-12-05
  Administered 2020-11-28 – 2020-12-05 (×12): 10 mg via ORAL
  Filled 2020-11-28 (×14): qty 1

## 2020-11-28 MED ORDER — ALPRAZOLAM 1 MG PO TABS
1.0000 mg | ORAL_TABLET | Freq: Three times a day (TID) | ORAL | Status: DC | PRN
  Administered 2020-11-28 – 2020-12-05 (×11): 1 mg via ORAL
  Filled 2020-11-28 (×11): qty 1

## 2020-11-28 MED ORDER — POLYETHYLENE GLYCOL 3350 17 G PO PACK
17.0000 g | PACK | Freq: Two times a day (BID) | ORAL | Status: DC
  Administered 2020-11-28 – 2020-12-06 (×14): 17 g via ORAL
  Filled 2020-11-28 (×16): qty 1

## 2020-11-28 NOTE — Telephone Encounter (Signed)
Informed patient that his FMLA form is ready for pick up. He will come today. Copy to HIM for scanning.

## 2020-11-28 NOTE — Progress Notes (Addendum)
HEMATOLOGY-ONCOLOGY PROGRESS NOTE  SUBJECTIVE: She has persistent abdominal pain.  Now requiring IV pain medication.  Has decreased ostomy output but she thinks part of this is related to being on a liquid diet.  Oncology History  Rectal cancer ([place])  04/27/2018 Initial Diagnosis   Rectal cancer ([place])   05/11/2018 -  Chemotherapy    Patient is on Treatment Plan: COLORECTAL FOLFOX + ZIRABEV Q 21 DAYS      06/13/2019 - 10/15/2020 Chemotherapy    Patient is on Treatment Plan: COLORECTAL FOLFOX + ZIRABEV Q 21 DAYS        PHYSICAL EXAMINATION:  Vitals:   11/28/20 0201 11/28/20 0528  BP: 122/88 (!) 130/95  Pulse: (!) 115 (!) 118  Resp: 16 18  Temp: 98.9 F (37.2 C) 98 F (36.7 C)  SpO2: 90% 92%   Filed Weights   11/27/20 0426 11/28/20 0500  Weight: 60.5 kg 63.6 kg    Intake/Output from previous day: 05/19 0701 - 05/20 0700 In: -  Out: 1250 [Urine:650; Stool:300; Blood:300]  GENERAL:alert, no distress and comfortable OROPHARYNX:no exudate, no erythema and lips, buccal mucosa, and tongue normal  LUNGS: clear to auscultation and percussion with normal breathing effort HEART: Regular, tachycardia, no lower extremity edema ABDOMEN: Mildly distended with hypoactive bowel sounds, diffuse tenderness, left lower quadrant colostomy with small amount of liquid brown stool  NEURO: alert & oriented x 3 with fluent speech, no focal motor/sensory deficits  Port-A-Cath without erythema  LABORATORY DATA:  I have reviewed the data as listed CMP Latest Ref Rng & Units 11/28/2020 11/27/2020 11/26/2020  Glucose 70 - 99 mg/dL 92 91 121(H)  BUN 6 - 20 mg/dL _0 Creatinine 0.44 - 1.00 mg/dL <0.30(L) <0.30(L) 0.38(L)  Sodium 135 - 145 mmol/L 135 132(L) 132(L)  Potassium 3.5 - 5.1 mmol/L 4.3 4.2 3.6  Chloride 98 - 111 mmol/L 104 99 96(L)  CO2 22 - 32 mmol/L _1 Calcium 8.9 - 10.3 mg/dL 8.1(L) 8.1(L) 8.7(L)  Total Protein 6.5 - 8.1 g/dL - - 5.6(L)  Total Bilirubin 0.3 - 1.2 mg/dL -  - 1.0  Alkaline Phos 38 - 126 U/L - - 253(H)  AST 15 - 41 U/L - - 33  ALT 0 - 44 U/L - - 10    Lab Results  Component Value Date   WBC 9.7 11/28/2020   HGB 9.9 (L) 11/28/2020   HCT 32.5 (L) 11/28/2020   MCV 70.0 (L) 11/28/2020   PLT 215 11/28/2020   NEUTROABS 7.2 11/28/2020    CT CHEST W CONTRAST  Result Date: 11/18/2020 CLINICAL DATA:  Abdominal pain for 1 week. History of colon cancer. On chemotherapy. Prior colostomy. EXAM: CT CHEST, ABDOMEN, AND PELVIS WITH CONTRAST TECHNIQUE: Multidetector CT imaging of the chest, abdomen and pelvis was performed following the standard protocol during bolus administration of intravenous contrast. CONTRAST:  66m OMNIPAQUE IOHEXOL 300 MG/ML  SOLN COMPARISON:  CT 09/01/2020 FINDINGS: CT CHEST FINDINGS Cardiovascular: Port in the anterior chest wall with tip in distal SVC. Coronary artery calcification and aortic atherosclerotic calcification. Mediastinum/Nodes: No axillary or supraclavicular adenopathy. No mediastinal or hilar adenopathy. No pericardial fluid. Esophagus normal. Lungs/Pleura: Bilateral pulmonary nodules again demonstrated. Nodules appear very similar to CT 09/01/2020. Example nodule in the superior segment of the RIGHT lower lobe measures 9 mm (image 55) compared to 10 mm on prior. Nodule in the RIGHT upper lobe measures 9 mm (image 57) compared with 9 mm. Nodule in the lateral aspect of the  LEFT lower lobe measures 6 mm (image 85) compared with 5 mm. Peripheral LEFT upper lobe nodule measures 5 mm (image 67) compared with 6 mm. No new nodules are identified. Musculoskeletal: No aggressive osseous lesion. CT ABDOMEN AND PELVIS FINDINGS Hepatobiliary: No focal hepatic lesion. Small amount pericholecystic fluid is new from prior (image 16/series 2). No intrahepatic duct dilatation. Common bile duct normal caliber. Pancreas: Pancreas is normal. No ductal dilatation. No pancreatic inflammation. Spleen: Spleen normal Adrenals/urinary tract: Adrenal  glands and kidneys are normal. The ureters and bladder normal. Stomach/Bowel: Stomach and duodenum normal. There is mild submucosal edema within the loops of the small bowel deep within the pelvis (image 107/series 602). The small amount of fluid within the pelvis which is similar prior. There is fluid stool throughout the colon. The distal colon leading up to the ostomy demonstrates sub mucosal edema and mucosal enhancement (image 90/62 for example). There is mucosal enhancement through the colon exiting the ostomy. No evidence of local recurrence in the proctectomy site. There is no evidence of bowel obstruction. Vascular/Lymphatic: Abdominal aorta is normal caliber with atherosclerotic calcification. There is no retroperitoneal or periportal lymphadenopathy. No pelvic lymphadenopathy. Reproductive: Post hysterectomy.  Adnexa unremarkable Other: Along the posterior margin of the RIGHT hepatic lobe there is peritoneal thickening with potential enhancement. Small amount fluid at this site on comparison exam however the enhancement is new (image 63/62). Nodular enhancement measures 10 mm x 7 mm image 64/602). Nodularity also seen on coronal image 24/605) Musculoskeletal: No aggressive osseous lesion. IMPRESSION: Chest Impression: 1. Stable bilateral pulmonary nodular metastasis. 2. No mediastinal lymphadenopathy. Abdomen / Pelvis Impression: 1. Submucosal edema and mucosal enhancement involving the distal colon leading up to the colostomy as well as loops of small bowel in the pelvis. Findings are most consistent with colitis and enteritis. No high-grade obstruction. a 2. Concern for subtle peritoneal nodular enhancement along the posterior aspect the RIGHT hepatic lobe. Recommend close attention on follow-up with CT scan or FDG PET scan. These results will be called to the ordering clinician or representative by the Radiologist Assistant, and communication documented in the PACS or [employer]. Electronically  Signed   By: Suzy Bouchard M.D.   On: 11/18/2020 16:42   CT Abdomen Pelvis W Contrast  Result Date: 11/18/2020 CLINICAL DATA:  Abdominal pain for 1 week. History of colon cancer. On chemotherapy. Prior colostomy. EXAM: CT CHEST, ABDOMEN, AND PELVIS WITH CONTRAST TECHNIQUE: Multidetector CT imaging of the chest, abdomen and pelvis was performed following the standard protocol during bolus administration of intravenous contrast. CONTRAST:  66m OMNIPAQUE IOHEXOL 300 MG/ML  SOLN COMPARISON:  CT 09/01/2020 FINDINGS: CT CHEST FINDINGS Cardiovascular: Port in the anterior chest wall with tip in distal SVC. Coronary artery calcification and aortic atherosclerotic calcification. Mediastinum/Nodes: No axillary or supraclavicular adenopathy. No mediastinal or hilar adenopathy. No pericardial fluid. Esophagus normal. Lungs/Pleura: Bilateral pulmonary nodules again demonstrated. Nodules appear very similar to CT 09/01/2020. Example nodule in the superior segment of the RIGHT lower lobe measures 9 mm (image 55) compared to 10 mm on prior. Nodule in the RIGHT upper lobe measures 9 mm (image 57) compared with 9 mm. Nodule in the lateral aspect of the LEFT lower lobe measures 6 mm (image 85) compared with 5 mm. Peripheral LEFT upper lobe nodule measures 5 mm (image 67) compared with 6 mm. No new nodules are identified. Musculoskeletal: No aggressive osseous lesion. CT ABDOMEN AND PELVIS FINDINGS Hepatobiliary: No focal hepatic lesion. Small amount pericholecystic fluid is new from  prior (image 16/series 2). No intrahepatic duct dilatation. Common bile duct normal caliber. Pancreas: Pancreas is normal. No ductal dilatation. No pancreatic inflammation. Spleen: Spleen normal Adrenals/urinary tract: Adrenal glands and kidneys are normal. The ureters and bladder normal. Stomach/Bowel: Stomach and duodenum normal. There is mild submucosal edema within the loops of the small bowel deep within the pelvis (image 107/series 602). The  small amount of fluid within the pelvis which is similar prior. There is fluid stool throughout the colon. The distal colon leading up to the ostomy demonstrates sub mucosal edema and mucosal enhancement (image 90/62 for example). There is mucosal enhancement through the colon exiting the ostomy. No evidence of local recurrence in the proctectomy site. There is no evidence of bowel obstruction. Vascular/Lymphatic: Abdominal aorta is normal caliber with atherosclerotic calcification. There is no retroperitoneal or periportal lymphadenopathy. No pelvic lymphadenopathy. Reproductive: Post hysterectomy.  Adnexa unremarkable Other: Along the posterior margin of the RIGHT hepatic lobe there is peritoneal thickening with potential enhancement. Small amount fluid at this site on comparison exam however the enhancement is new (image 63/62). Nodular enhancement measures 10 mm x 7 mm image 64/602). Nodularity also seen on coronal image 24/605) Musculoskeletal: No aggressive osseous lesion. IMPRESSION: Chest Impression: 1. Stable bilateral pulmonary nodular metastasis. 2. No mediastinal lymphadenopathy. Abdomen / Pelvis Impression: 1. Submucosal edema and mucosal enhancement involving the distal colon leading up to the colostomy as well as loops of small bowel in the pelvis. Findings are most consistent with colitis and enteritis. No high-grade obstruction. a 2. Concern for subtle peritoneal nodular enhancement along the posterior aspect the RIGHT hepatic lobe. Recommend close attention on follow-up with CT scan or FDG PET scan. These results will be called to the ordering clinician or representative by the Radiologist Assistant, and communication documented in the PACS or [employer]. Electronically Signed   By: Suzy Bouchard M.D.   On: 11/18/2020 16:42   DG Chest Port 1 View  Result Date: 11/18/2020 CLINICAL DATA:  Abdominal pain and vomiting for 1 week. History of colon cancer. EXAM: PORTABLE CHEST 1 VIEW  COMPARISON:  CT chest, abdomen and pelvis 09/01/2020. FINDINGS: Right IJ approach Port-A-Cath is in place. The lungs are emphysematous. A few scattered pulmonary nodules consistent with known metastatic disease are seen. No consolidative process, pneumothorax or effusion. Heart size is normal. Aortic atherosclerosis. IMPRESSION: No acute disease. Pulmonary nodules consistent with known metastatic disease. Aortic Atherosclerosis (ICD10-I70.0) and Emphysema (ICD10-J43.9). Electronically Signed   By: Inge Rise M.D.   On: 11/18/2020 14:02    ASSESSMENT AND PLAN: 1. Rectal cancer-rectal mass noted on digital exam 02/10/2018, colonoscopy confirmed a him my circumferential mass in the rectum ? 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 CTs 02/10/2018-anterior rectal wall thickening, pulmonary nodules measuring up to 9 mm concerning for metastases, few round perirectal lymph nodes ? Pelvic MRI 02/20/2018-hypermetabolic low rectal mass extending to the posterior vagina with 2 small enlarged perirectal lymph nodes, T4b,N1-2.3 cm from the anal verge ? PET scan 02/23/2018-hypermetabolic rectal mass, 8 mm hypermetabolic lingular nodule, scattered small bilateral lung nodules, some calcified, a few with mildly increased activity ? Cycle 1 FOLFOXIRI8/21/2019 ? Cycle 5 FOLFOXIRI10/16/2019 ? CTs 05/01/2018-significant interval response to therapy with decreased size of the primary rectal mass lesion. Decreasing perirectal lymphadenopathy. Decreased and/or resolved pulmonary nodules. No new sites of disease  identified. ? Cycle 6 FOLFOXIRI10/31/2019 ? Radiation/Xeloda 06/12/2018-completed 07/21/2018 ? Xeloda dose reduced 07/10/2018 due to mucositis and diarrhea ? CTs  08/07/2018-compared to 02/10/2018-resolved and decreased pulmonary nodules, few tiny residual noncalcified nodules, decreased 20 perirectal lymph nodes, soft tissue of the rectum indistinguishable from posterior wall of vagina-Vaginal involvement by tumor? ? 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 CT chest 09/29/2018-enlargement of lung nodules, not a candidate for SBRT based on discussion in GI tumor conference and with radiation oncology ? CT chest 11/27/18 - interval growth of numerous pulmonary metastases bilaterally compared to 09/29/18, no new pulmonary mets ? Cycle 1FOLFOXIRI 11/29/18 ? Cycle 2 FOLFOXIRI6/10/2018 ? Cycle 3 FOLFOXIRI 12/28/2018 ? Cycle 4 FOLFIRINOX 01/11/2019 ? CTs 01/24/2019-significant improvement of bilateral pulmonary metastases. ? Cycle 1 FOLFIRI 02/01/2019 ? Cycle 2 FOLFIRI 02/22/2019 ? Cycle 3 FOLFIRI 03/15/2019 ? Cycle 4 FOLFIRI 04/05/2019 ? Cycle 5 FOLFIRI 04/26/2019 ? CTs 05/14/2019-slight enlargement of right lung nodules, no other evidence of disease progression ? Cycle 6 FOLFIRI 05/17/2019 ? Cycle 7 FOLFIRI plus Avastin 06/13/2019 ? Cycle 8 FOLFIRI plus Avastin 07/09/2019 ? Cycle 9 FOLFIRI plus Avastin 07/25/2019 ? Cycle 10 FOLFIRI plus Avastin 08/09/2019 ? Cycle 11 FOLFIRI plus Avastin 08/22/2019 ? CTs 09/07/2019-most pulmonary nodules are stable. 1 nodule right lower lobe slightly larger. No new lung lesions. ? Cycle 12 FOLFIRI plus Avastin 09/13/2019 ? Cycle 13 FOLFIRI plus Avastin 10/04/2019 ? Cycle 14 FOLFIRI plus Avastin 10/25/2019 ? Cycle 15 FOLFIRI plus Avastin 11/14/2019 ? Cycle 16 FOLFIRI plus Avastin 12/06/2019 ? CTs neck, chest, abdomen, pelvis 12/21/2019-neck negative; mildly progressive pulmonary metastases; abdomen and pelvis  negative for evidence of metastatic disease. ? Cycle 1 FOLFOX/Avastin 12/27/2019 ? Cycle 2 FOLFOX/Avastin 01/17/2020 ? Cycle 3 FOLFOX/Avastin 02/07/2020 ? Cycle 4 FOLFOX 02/28/2020, Avastin held due to need for dental evaluation possible extractions ? Cycle 5 FOLFOX 03/20/2020, Avastin held pending dental evaluation ? CTs 04/04/2020-increased size of pulmonary nodules, no evidence of metastatic disease in the abdomen or pelvis ? Cycle 6 FOLFOX 05/22/2020, Avastin held secondary to dental extractions ? Cycle 7 FOLFOX/Avastin 06/12/2020 ? Cycle 8 FOLFOX/Avastin 06/30/2020 ? Cycle 9 FOLFOX/Avastin 07/24/2020 ? Cycle 10 FOLFOX/Avastin 08/14/2020 ? CTs 09/01/2020-improvement in bilateral pulmonary metastases. ? Cycle 11 FOLFOX/Avastin 09/04/2020 ? Cycle 12 FOLFOX/Avastin 09/25/2020 ? Cycle 13 FOLFOX/Avastin 10/15/2020 ? Cycle 14 FOLFOX/Avastin 11/05/2020 ? 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 Imodium as needed. 6.Mucositis secondary to Xeloda. Improved 07/19/2018. 7. 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 Admission 11/18/2020 with nausea/vomiting, fever, and abdominal pain-CT consistent with enteritis/colitis 12.  Admission 11/26/2020 with nausea, abdominal pain, loose stool, and dehydration 13.  Hospital admission 11/26/2020- nausea and diarrhea  Tina Morales appears unchanged.  She has persistent abdominal pain.  She is requiring IV  pain medication.  The patient was seen by GI who recommend supportive symptom management and is not planning for endoscopic evaluation at this point.  The etiology of  her symptoms remains unclear.  Doubt symptoms are related to colitis from chemotherapy.  Concerned abdominal pain could be related to unrecognized carcinomatosis or early bowel obstruction. Will obtain acute abdominal imaging and discuss further with GI.  If symptoms or not improving, would consider general surgery evaluation.  Recommendations: 1.  Continue current pain medications, antiemetics, and antidiarrheals.  Convert to oral pain regimen as tolerated 2.  Acute abdominal series ordered. 3.  Continue IV fluids 4.  Consider general surgery consult if no improvement in symptoms.\ 5.  Please call oncology over the weekend as needed.  I will see her 12/01/2020 if she remains in the hospital.  Outpatient follow-up will be scheduled at the Cancer center.   LOS: 2 days   Mikey Bussing, DNP, AGPCNP-BC, AOCNP 11/28/20 Tina Morales was interviewed and examined.  She continues to have nausea and abdominal pain.  She has small volume liquid output from the ostomy.  The etiology of her symptoms is unclear.  An abdominal x-ray reveals no evidence of obstruction.  The differential diagnosis includes pain related to carcinomatosis, adhesions, or another GI process.  I do not think her pain is related to chemotherapy.  She is afebrile.  I discussed the case with Dr. Paulita Fujita.  He does not recommend endoscopic evaluation.  I think we should advance her diet as tolerated.  Begin a bowel regimen.  Change to an oral narcotic regimen.  The etiology of the tachycardia is unclear.  She does not have symptoms to suggest pulmonary embolism and the tachycardia is chronic.   She can be discharged home if tolerating a diet and oral pain regimen.  We should consider consulting surgery if her symptoms persist.  I was present for greater than 50% of today's  visit.  I performed medical decision making.

## 2020-11-28 NOTE — Plan of Care (Signed)
Pt was able to sleep comfortably last night; she has a very small epidermal tear on abdomen after she removed a tele electrode; small bandaid apply; no s/s of acute distress or pain reported or observed; call light within reach and bed at lowest position for safety.

## 2020-11-28 NOTE — Progress Notes (Addendum)
PROGRESS NOTE    Lovell Roe  IWP:[id number] DOB: 14-Feb-1966 DOA: 11/26/2020 PCP: Ladell Pier, MD    Brief Narrative:  Ms. Eskin was admitted to the hospital working diagnosis of worsening diarrhea and abdominal pain.  55 year old female past medical history for rectal cancer, tobacco abuse, iron deficiency anemia, migraines, hypertension and polyneuropathy who presented with nausea vomiting and diarrhea.  Recent hospitalization for enteritis/colitis, from 05/10 to 5/12.  Completed course of antibiotic therapy with Augmentin on 5/17.  At home she had recurrent gastrointestinal symptoms, that prompted her to come back to the hospital.  On her initial physical examination she was hemodynamically stable, her lungs are clear to auscultation bilaterally, heart S1-S2, present, rhythmic, her abdomen was soft, nontender nondistended, left lower quadrant colostomy bag present, no lower extremity edema.  Sodium 132, potassium 3.6, chloride 96, bicarb 27, glucose 121, BUN 7, creatinine 0.33, white count 9.8, hemoglobin 9.4, hematocrit 36.8, platelets 287.  Patient placed on supportive medical therapy with IV analgesics and antiemetics.  No endoscopic intervention recommended per GI.   Patient continue to have persistent abdominal pian and nausea. She has been frustrated due to persistent symptoms.     Assessment & Plan:   Principal Problem:   Colitis Active Problems:   Rectal cancer ([place])   Anemia of chronic disease   Metastatic cancer to lung ([place])   Diarrhea    1. Enteritis/ colitis, GI panel negative including C diff, Persistent pain and nausea, poor oral intake due to pain.   Plan to continue supportive medical care and support nutrition. Abdominal films with distended bowel but not frank signs of obstruction.    Continue pain control with hydromorphone, over last 24 hr has used 4 mg. Add long acting opioid analgesic with oxycodone ER, along with bid miralax.  Continue with soft  diet, patient allowed to have food from home.   Discontinue lomotil and add dicyclomine.  Continue IV fluids for hydration. Encourage out of bed to chair tid with meals. PT and OT evaluation.  Consult nutrition.  Antiacid therapy with pantoprazole.   2. Hypokalemia/ hyponatremia. Na is up to 135, K is 4,3 and serum bicarbonate at 28. Continue with isotonic saline and dextrose hydration.   3. Rectal cancer. Sp abdominal perineal resection and vaginectomy on 2020. Positive pulmonary metastasis.  Case discussed with Dr Benay Spice, will continue supportive medical therapy for now.   4. GERD on pantoprazole,   5. Anxiety. PRN alprazolam for anxiety, increase dose from 0,5 to 1 mg.   Patient continue to be at high risk for worsening abdominal pain,   Status is: Inpatient  Remains inpatient appropriate because:IV treatments appropriate due to intensity of illness or inability to take PO   Dispo: The patient is from: Home              Anticipated d/c is to: Home              Patient currently is not medically stable to d/c.   Difficult to place patient No   DVT prophylaxis: Enoxaparin   Code Status:   full  Family Communication:  I spoke with patient's hudband at the bedside, we talked in detail about patient's condition, plan of care and prognosis and all questions were addressed.      Nutrition Status: Nutrition Problem: Increased nutrient needs Etiology: cancer and cancer related treatments Signs/Symptoms: estimated needs Interventions: Boost Breeze,Ensure Enlive (each supplement provides 350kcal and 20 grams of protein)     Consultants:  GI  Oncology    Subjective: Patient continue to have abdominal pain with mild nausea but not vomiting, positive liquid stool in the colostomy bag. No vomiting. Very weak and deconditioned, feels frustrated of ongoing symptoms.    Objective: Vitals:   11/27/20 2153 11/28/20 0201 11/28/20 0500 11/28/20 0528  BP: (!) 138/100 122/88   (!) 130/95  Pulse: (!) 140 (!) 115  (!) 118  Resp: 18 16  18   Temp: 99.2 F (37.3 C) 98.9 F (37.2 C)  98 F (36.7 C)  TempSrc: Oral Oral  Oral  SpO2: 93% 90%  92%  Weight:   63.6 kg     Intake/Output Summary (Last 24 hours) at 11/28/2020 1303 Last data filed at 11/28/2020 1100 Gross per 24 hour  Intake --  Output 950 ml  Net -950 ml   Filed Weights   11/27/20 0426 11/28/20 0500  Weight: 60.5 kg 63.6 kg    Examination:   General: Not in pain or dyspnea, deconditioned and ill looking appearing  Neurology: Awake and alert, non focal  E ENT: no pallor, no icterus, oral mucosa moist Cardiovascular: No JVD. S1-S2 present, rhythmic, no gallops, rubs, or murmurs. No lower extremity edema. Pulmonary: positive breath sounds bilaterally, adequate air movement, no wheezing, rhonchi or rales. Gastrointestinal. Abdomen distended and tympanic, tender to deep palpation with no rebound or guarding Skin. No rashes Musculoskeletal: no joint deformities     Data Reviewed: I have personally reviewed following labs and imaging studies  CBC: Recent Labs  Lab 11/26/20 0919 11/28/20 0534  WBC 9.8 9.7  NEUTROABS 7.8* 7.2  HGB 11.4* 9.9*  HCT 36.8 32.5*  MCV 69.6* 70.0*  PLT 287 657   Basic Metabolic Panel: Recent Labs  Lab 11/26/20 0919 11/27/20 0533 11/28/20 0534  NA 132* 132* 135  K 3.6 4.2 4.3  CL 96* 99 104  CO2 27 24 28   GLUCOSE 121* 91 92  BUN 7 6 7   CREATININE 0.38* <0.30* <0.30*  CALCIUM 8.7* 8.1* 8.1*  MG  --  2.1  --   PHOS  --  3.6  --    GFR: CrCl cannot be calculated (This lab value cannot be used to calculate CrCl because it is not a number: <0.30). Liver Function Tests: Recent Labs  Lab 11/26/20 0919  AST 33  ALT 10  ALKPHOS 253*  BILITOT 1.0  PROT 5.6*  ALBUMIN 3.3*   No results for input(s): LIPASE, AMYLASE in the last 168 hours. No results for input(s): AMMONIA in the last 168 hours. Coagulation Profile: No results for input(s): INR, PROTIME  in the last 168 hours. Cardiac Enzymes: No results for input(s): CKTOTAL, CKMB, CKMBINDEX, TROPONINI in the last 168 hours. BNP (last 3 results) No results for input(s): PROBNP in the last 8760 hours. HbA1C: No results for input(s): HGBA1C in the last 72 hours. CBG: No results for input(s): GLUCAP in the last 168 hours. Lipid Profile: No results for input(s): CHOL, HDL, LDLCALC, TRIG, CHOLHDL, LDLDIRECT in the last 72 hours. Thyroid Function Tests: No results for input(s): TSH, T4TOTAL, FREET4, T3FREE, THYROIDAB in the last 72 hours. Anemia Panel: No results for input(s): VITAMINB12, FOLATE, FERRITIN, TIBC, IRON, RETICCTPCT in the last 72 hours.    Radiology Studies: I have reviewed all of the imaging during this hospital visit personally     Scheduled Meds: . Chlorhexidine Gluconate Cloth  6 each Topical Daily  . enoxaparin (LOVENOX) injection  40 mg Subcutaneous Q24H  . feeding supplement  1  Container Oral TID BM  . feeding supplement  237 mL Oral BID BM  . magnesium oxide  400 mg Oral BID  . pantoprazole  40 mg Oral Daily   Continuous Infusions: . dextrose 5 % and 0.9% NaCl 75 mL/hr at 11/28/20 0520     LOS: 2 days        Chabely Norby Gerome Apley, MD

## 2020-11-28 NOTE — Progress Notes (Signed)
[facility] Progress Note  Tina Morales 55 y.o. 04-20-1966  CC:  Abdominal pain, colitis on CT  Subjective: Patient reports continued abdominal pain, currently 4/10, but can reach 9-10 of 10.  She has poor appetite. Denies nausea/vomiting today or yesterday. Notes somewhat decreased ostomy output.  ROS : Review of Systems  Cardiovascular: Negative for chest pain and palpitations.  Gastrointestinal: Positive for abdominal pain. Negative for blood in stool, constipation, diarrhea, heartburn, melena, nausea and vomiting.   Objective: Vital signs in last 24 hours: Vitals:   11/28/20 0201 11/28/20 0528  BP: 122/88 (!) 130/95  Pulse: (!) 115 (!) 118  Resp: 16 18  Temp: 98.9 F (37.2 C) 98 F (36.7 C)  SpO2: 90% 92%    Physical Exam:  General:  Alert, cooperative, no distress  Head:  Normocephalic, without obvious abnormality, atraumatic  Eyes:  Anicteric sclera, EOMs intact  Lungs:   Clear to auscultation bilaterally, respirations unlabored  Heart:  Tachycardic  Abdomen:   Soft and non-distended with mild lower abdominal tenderness to palpation, bowel sounds active all four quadrants  Extremities: Extremities normal, atraumatic, no  edema    Lab Results: Recent Labs    11/27/20 0533 11/28/20 0534  NA 132* 135  K 4.2 4.3  CL 99 104  CO2 24 28  GLUCOSE 91 92  BUN 6 7  CREATININE <0.30* <0.30*  CALCIUM 8.1* 8.1*  MG 2.1  --   PHOS 3.6  --    Recent Labs    11/26/20 0919  AST 33  ALT 10  ALKPHOS 253*  BILITOT 1.0  PROT 5.6*  ALBUMIN 3.3*   Recent Labs    11/26/20 0919 11/28/20 0534  WBC 9.8 9.7  NEUTROABS 7.8* 7.2  HGB 11.4* 9.9*  HCT 36.8 32.5*  MCV 69.6* 70.0*  PLT 287 215   No results for input(s): LABPROT, INR in the last 72 hours.    Assessment: Diarrhea, nausea, vomiting, abdominal pain. Nonspecific Colitis and enteritis on CT imaging. No evidence of obstruction. 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  Continue Bentyl QID.  Discussed with Dr. Paulita Fujita and Dr. Benay Spice; colonoscopy not needed at this time.  [facility] will sign off.  Please contact us if we can be of any further assistance during this hospital stay.  Salley Slaughter PA-C 11/28/2020, 1:04 PM  Contact #  [phone]

## 2020-11-28 NOTE — Progress Notes (Signed)
Nutrition Follow-up  INTERVENTION:   -Boost Breeze po TID, each supplement provides 250 kcal and 9 grams of protein  -Ensure MAX Protein po BID, each supplement provides 150 kcal and 30 grams of protein  -d/c Ensure Enlive  NUTRITION DIAGNOSIS:   Increased nutrient needs related to cancer and cancer related treatments as evidenced by estimated needs.  Ongoing.  GOAL:   Patient will meet greater than or equal to 90% of their needs  Progressing.  MONITOR:   PO intake,Supplement acceptance,Labs,Weight trends,I & O's  REASON FOR ASSESSMENT:   Consult Assessment of nutrition requirement/status  ASSESSMENT:   55 y.o. female with medical history significant for rectal cancer, history of diarrhea and rectal pain secondary to #1, tobacco use disorder, iron deficiency anemia, migraines, essential hypertension, polyneuropathy, who was recently admitted on 11/18/2020 for the same.  At that time CT was consistent with enteritis/colitis.  Her clinical status improved with hydration and antibiotics in the hospital.  She completed an outpatient course of Augmentin on 11/25/2020.  She presented again today for the same complaints.  Due to worsening diarrhea,nausea and vomiting with concern for dehydration she was advised to come to the ED from oncology center for further evaluation.  Patient continues to have poor appetite r/t continued abdominal pain. Pt has been accepting Boost Breeze but no Ensure at this time. Will order Ensure Max as pt has drank Premier Protein and [employer]life shakes in the past.   Admission weight: 133 lbs. Current weight: 140 lbs.  Medications: MAG-OX, Miralax, D5 infusion, IV Compazine  Labs reviewed.  Diet Order:   Diet Order            DIET SOFT Room service appropriate? Yes; Fluid consistency: Thin  Diet effective now                 EDUCATION NEEDS:   No education needs have been identified at this time  Skin:  Skin Assessment: Reviewed RN  Assessment  Last BM:  5/18- colostomy  Height:   Ht Readings from Last 1 Encounters:  11/26/20 5\' 8"  (1.727 m)    Weight:   Wt Readings from Last 1 Encounters:  11/28/20 63.6 kg    BMI:  Body mass index is 21.32 kg/m.  Estimated Nutritional Needs:   Kcal:  1800-2000  Protein:  90-105g  Fluid:  2L/day  Clayton Bibles, MS, RD, LDN Inpatient Clinical Dietitian Contact information available via Amion

## 2020-11-29 DIAGNOSIS — D638 Anemia in other chronic diseases classified elsewhere: Secondary | ICD-10-CM | POA: Diagnosis not present

## 2020-11-29 DIAGNOSIS — R197 Diarrhea, unspecified: Secondary | ICD-10-CM | POA: Diagnosis not present

## 2020-11-29 DIAGNOSIS — C20 Malignant neoplasm of rectum: Secondary | ICD-10-CM | POA: Diagnosis not present

## 2020-11-29 DIAGNOSIS — K529 Noninfective gastroenteritis and colitis, unspecified: Secondary | ICD-10-CM | POA: Diagnosis not present

## 2020-11-29 NOTE — Progress Notes (Addendum)
PROGRESS NOTE    Tina Morales  WUJ:[id number] DOB: 12/09/1965 DOA: 11/26/2020 PCP: Ladell Pier, MD    Brief Narrative:  Tina Morales was admitted to the hospital working diagnosis of worsening diarrhea and abdominal pain.  55 year old female past medical history for rectal cancer, tobacco abuse, iron deficiency anemia, migraines, hypertension and polyneuropathy who presented with nausea vomiting and diarrhea. Recent hospitalization for enteritis/colitis, from 05/10 to 5/12.Completed course of antibiotic therapy with Augmentin on 5/17. At home she had recurrent gastrointestinal symptoms, that prompted her to come back to the hospital. On her initial physical examination she was hemodynamically stable, her lungs are clear to auscultation bilaterally, heart S1-S2, present, rhythmic, her abdomen was soft, nontender nondistended, left lower quadrant colostomy bag present, no lower extremity edema.  Sodium 132, potassium 3.6, chloride 96, bicarb 27, glucose 121, BUN 7, creatinine 0.33, white count 9.8, hemoglobin 9.4, hematocrit 36.8, platelets 287.  Patient placed on supportive medical therapy with IV analgesics and antiemetics.  No endoscopic intervention recommended per GI.   Patient continue to have persistent abdominal pian and nausea. She has been frustrated due to persistent symptoms.    Plan to achieve pain control in order to allow appropriate nutrition.    Assessment & Plan:   Principal Problem:   Colitis Active Problems:   Rectal cancer ([place])   Anemia of chronic disease   Metastatic cancer to lung ([place])   Diarrhea    1. Enteritis/ colitis, GI panel negative including C diff, Her pain has improved with long acting opiate analgesics and her family has brought food from home. Still having spikes of severe pain, not yet back to baseline.   Analgesic regimen with PRN hydromorphone and oxycodone plus long acting oxycodone.  Continue with tid dicyclomine and as needed  antiemetics.  Encourage po intake and continue with nutritional supplements.  Out of bed to chair tid with meals. Continue with bowel regimen with Miralax.  Advance diet to regular.  If continue to improve pain control, possible dc in 24 to 48 hrs home.   2. Hypokalemia/ hyponatremia. Improved po intake, will discontinue IV fluids for now.  3. Rectal cancer. Sp abdominal perineal resection and vaginectomy on 2020. Positive pulmonary metastasis.  Case discussed with Dr Benay Spice, will continue supportive medical therapy for now.   Plan for outpatient follow up.   4. GERDContinue with pantoprazole,   5. Anxiety. Continue with PRN alprazolam for anxiety. Dose increased to 1 mg with good toleration.   6. Anemia of chronic disease. Hgb and Hct have been stable at 9,9 and 32,5   Status is: Inpatient  Remains inpatient appropriate because:IV treatments appropriate due to intensity of illness or inability to take PO and Inpatient level of care appropriate due to severity of illness   Dispo: The patient is from: Home              Anticipated d/c is to: Home              Patient currently is not medically stable to d/c. possible dc in 24 to 48 hirs home.    Difficult to place patient No   DVT prophylaxis:  enoxaparin   Code Status:    full  Family Communication:  No family at the bedside      Nutrition Status: Nutrition Problem: Increased nutrient needs Etiology: cancer and cancer related treatments Signs/Symptoms: estimated needs Interventions: Boost Breeze,Ensure Enlive (each supplement provides 350kcal and 20 grams of protein)     Consultants:  Oncology   GI     Subjective: Patient with improved abdominal pain, at times down to 1-2/10 but continue to have spikes of severe pain, not yet back to baseline, no nausea or vomiting and tolerating po well, home food.   Objective: Vitals:   11/28/20 2057 11/29/20 0108 11/29/20 0522 11/29/20 0524  BP: 125/89 93/73   128/83  Pulse: (!) 118 (!) 116  (!) 109  Resp: 20 14  18   Temp: 99.4 F (37.4 C) 99.4 F (37.4 C)  98.7 F (37.1 C)  TempSrc: Oral Oral  Oral  SpO2: 96% 95% 92% 95%  Weight:    70.3 kg    Intake/Output Summary (Last 24 hours) at 11/29/2020 1124 Last data filed at 11/29/2020 0530 Gross per 24 hour  Intake 660 ml  Output 145 ml  Net 515 ml   Filed Weights   11/27/20 0426 11/28/20 0500 11/29/20 0524  Weight: 60.5 kg 63.6 kg 70.3 kg    Examination:   General: Not in pain or dyspnea, deconditioned  Neurology: Awake and alert, non focal  E ENT: mild pallor, no icterus, oral mucosa moist Cardiovascular: No JVD. S1-S2 present, rhythmic, no gallops, rubs, or murmurs. No lower extremity edema. Pulmonary: positive breath sounds bilaterally, adequate air movement, no wheezing, rhonchi or rales. Gastrointestinal. Abdomen mild distended but not tender, colostomy in place with stool present Skin. No rashes Musculoskeletal: no joint deformities     Data Reviewed: I have personally reviewed following labs and imaging studies  CBC: Recent Labs  Lab 11/26/20 0919 11/28/20 0534  WBC 9.8 9.7  NEUTROABS 7.8* 7.2  HGB 11.4* 9.9*  HCT 36.8 32.5*  MCV 69.6* 70.0*  PLT 287 546   Basic Metabolic Panel: Recent Labs  Lab 11/26/20 0919 11/27/20 0533 11/28/20 0534  NA 132* 132* 135  K 3.6 4.2 4.3  CL 96* 99 104  CO2 27 24 28   GLUCOSE 121* 91 92  BUN 7 6 7   CREATININE 0.38* <0.30* <0.30*  CALCIUM 8.7* 8.1* 8.1*  MG  --  2.1  --   PHOS  --  3.6  --    GFR: CrCl cannot be calculated (This lab value cannot be used to calculate CrCl because it is not a number: <0.30). Liver Function Tests: Recent Labs  Lab 11/26/20 0919  AST 33  ALT 10  ALKPHOS 253*  BILITOT 1.0  PROT 5.6*  ALBUMIN 3.3*   No results for input(s): LIPASE, AMYLASE in the last 168 hours. No results for input(s): AMMONIA in the last 168 hours. Coagulation Profile: No results for input(s): INR, PROTIME in  the last 168 hours. Cardiac Enzymes: No results for input(s): CKTOTAL, CKMB, CKMBINDEX, TROPONINI in the last 168 hours. BNP (last 3 results) No results for input(s): PROBNP in the last 8760 hours. HbA1C: No results for input(s): HGBA1C in the last 72 hours. CBG: No results for input(s): GLUCAP in the last 168 hours. Lipid Profile: No results for input(s): CHOL, HDL, LDLCALC, TRIG, CHOLHDL, LDLDIRECT in the last 72 hours. Thyroid Function Tests: No results for input(s): TSH, T4TOTAL, FREET4, T3FREE, THYROIDAB in the last 72 hours. Anemia Panel: No results for input(s): VITAMINB12, FOLATE, FERRITIN, TIBC, IRON, RETICCTPCT in the last 72 hours.    Radiology Studies: I have reviewed all of the imaging during this hospital visit personally     Scheduled Meds: . Chlorhexidine Gluconate Cloth  6 each Topical Daily  . dicyclomine  10 mg Oral TID AC & HS  . feeding  supplement  1 Container Oral TID BM  . magnesium oxide  400 mg Oral BID  . oxyCODONE  10 mg Oral Q12H  . pantoprazole  40 mg Oral Daily  . polyethylene glycol  17 g Oral BID  . Ensure Max Protein  11 oz Oral BID   Continuous Infusions: . dextrose 5 % and 0.9% NaCl 75 mL/hr at 11/28/20 1850     LOS: 3 days        Tina Morales Gerome Apley, MD

## 2020-11-29 NOTE — Evaluation (Signed)
Occupational Therapy Evaluation Patient Details Name: Tina Morales MRN: [id number] DOB: Feb 13, 1966 Today's Date: 11/29/2020    History of Present Illness Tina Morales was admitted to the hospital working diagnosis of worsening diarrhea and abdominal pain.     55 year old female past medical history for rectal cancer, tobacco abuse, iron deficiency anemia, migraines, hypertension and polyneuropathy who presented with nausea vomiting and diarrhea.  Recent hospitalization for enteritis/colitis, from 05/10 to 5/12.   Clinical Impression   Patient demonstrates generalized upper body strength but otherwise able to perform ADLs independently and in room ambulation without DME but managing IV pole. No OT needs at this time.    Follow Up Recommendations  No OT follow up    Equipment Recommendations  None recommended by OT    Recommendations for Other Services       Precautions / Restrictions Precautions Precautions: None Restrictions Weight Bearing Restrictions: No      Mobility Bed Mobility Overal bed mobility: Modified Independent                  Transfers Overall transfer level: Modified independent                    Balance Overall balance assessment: No apparent balance deficits (not formally assessed)                                         ADL either performed or assessed with clinical judgement   ADL Overall ADL's : Modified independent                                             Vision Patient Visual Report: No change from baseline       Perception     Praxis      Pertinent Vitals/Pain Pain Assessment: No/denies pain     Hand Dominance Right   Extremity/Trunk Assessment Upper Extremity Assessment Upper Extremity Assessment: Generalized weakness   Lower Extremity Assessment Lower Extremity Assessment: Defer to PT evaluation   Cervical / Trunk Assessment Cervical / Trunk Assessment: Normal    Communication Communication Communication: No difficulties   Cognition Arousal/Alertness: Awake/alert Behavior During Therapy: WFL for tasks assessed/performed Overall Cognitive Status: Within Functional Limits for tasks assessed                                     General Comments       Exercises     Shoulder Instructions      Home Living Family/patient expects to be discharged to:: Private residence Living Arrangements: Spouse/significant other Available Help at Discharge: Family Type of Home: House Home Access: Stairs to enter Technical brewer of Steps: 2   Home Layout: One level     Bathroom Shower/Tub: Occupational psychologist: Handicapped height     Home Equipment: Shower seat - built in;Grab bars - toilet;Grab bars - tub/shower;Cane - single point          Prior Functioning/Environment Level of Independence: Independent                 OT Problem List:        OT Treatment/Interventions:      OT  Goals(Current goals can be found in the care plan section) Acute Rehab OT Goals OT Goal Formulation: All assessment and education complete, DC therapy  OT Frequency:     Barriers to D/C:            Co-evaluation              AM-PAC OT "6 Clicks" Daily Activity     Outcome Measure Help from another person eating meals?: None Help from another person taking care of personal grooming?: None Help from another person toileting, which includes using toliet, bedpan, or urinal?: None Help from another person bathing (including washing, rinsing, drying)?: None Help from another person to put on and taking off regular upper body clothing?: None Help from another person to put on and taking off regular lower body clothing?: None 6 Click Score: 24   End of Session Nurse Communication: Mobility status  Activity Tolerance: Patient tolerated treatment well Patient left: in bed;with call bell/phone within reach  OT Visit  Diagnosis: Muscle weakness (generalized) (M62.81)                Time: 1275-1700 OT Time Calculation (min): 13 min Charges:  OT General Charges $OT Visit: 1 Visit OT Evaluation $OT Eval Low Complexity: 1 Low  Tina Morales, OTR/L [place]  Office [phone] Pager: [phone]   Tina Morales 11/29/2020, 9:44 AM

## 2020-11-29 NOTE — Plan of Care (Signed)

## 2020-11-29 NOTE — Evaluation (Signed)
Physical Therapy Evaluation Patient Details Name: Tina Morales MRN: [id number] DOB: Dec 05, 1965 Today's Date: 11/29/2020   History of Present Illness  Ms. Hagadorn was admitted to the hospital on 11/26/20 with worsening diarrhea and abdominal pain.     55 year old female past medical history for rectal cancer, tobacco abuse, iron deficiency anemia, migraines, hypertension and polyneuropathy who presented with nausea vomiting and diarrhea.  Recent hospitalization for enteritis/colitis, from 05/10 to 5/12.  Clinical Impression  Pt admitted with above diagnosis.  She demonstrates safe gait and transfers.  Was able to ambulate 200' without assist and performed stairs.  She did have supervision due to recently received Dilaudid but was overall steady. She does not need DME and has support at home.  No further PT needs. Encouraged ambulation with family.    Follow Up Recommendations No PT follow up    Equipment Recommendations  None recommended by PT    Recommendations for Other Services       Precautions / Restrictions Precautions Precautions: None Restrictions Weight Bearing Restrictions: No      Mobility  Bed Mobility Overal bed mobility: Modified Independent                  Transfers Overall transfer level: Modified independent   Transfers: Sit to/from Stand Sit to Stand: Modified independent (Device/Increase time)            Ambulation/Gait Ambulation/Gait assistance: Supervision Gait Distance (Feet): 200 Feet Assistive device: None Gait Pattern/deviations: WFL(Within Functional Limits) Gait velocity: normal   General Gait Details: Normal gait pattern; provided supervision due to recent received Dilaudid  Stairs Stairs: Yes Stairs assistance: Min guard Stair Management: One rail Left;Alternating pattern;Forwards Number of Stairs: 4 General stair comments: performed safely  Wheelchair Mobility    Modified Rankin (Stroke Patients Only)       Balance  Overall balance assessment: No apparent balance deficits (not formally assessed)   Sitting balance-Leahy Scale: Normal       Standing balance-Leahy Scale: Good Standing balance comment: just received dilaudid so reports feeling a little woozy but overall was steady                             Pertinent Vitals/Pain Pain Assessment: 0-10 Pain Score: 3  Pain Location: abdomen Pain Descriptors / Indicators: Discomfort Pain Intervention(s): Premedicated before session;Monitored during session;Limited activity within patient's tolerance    Home Living Family/patient expects to be discharged to:: Private residence Living Arrangements: Spouse/significant other Available Help at Discharge: Family;Other (Comment) (Husband works 3rd shift so is with her when she is awake) Type of Home: House Home Access: Stairs to enter Entrance Stairs-Rails: Right;Left;Can reach both Technical brewer of Steps: 2 Home Layout: One level Home Equipment: Shower seat - built in;Grab bars - toilet;Grab bars - tub/shower;Cane - single point      Prior Function Level of Independence: Independent         Comments: Reports can ambulate in community but prefers to stick with smaller stores b/c of fatigue and neuropathy.  Does report likes to do yoga and stretches at home.     Hand Dominance   Dominant Hand: Right    Extremity/Trunk Assessment   Upper Extremity Assessment Upper Extremity Assessment: Defer to OT evaluation    Lower Extremity Assessment Lower Extremity Assessment: Overall WFL for tasks assessed (ROM WFL: MMT 5/5)    Cervical / Trunk Assessment Cervical / Trunk Assessment: Normal  Communication   Communication: No  difficulties  Cognition Arousal/Alertness: Awake/alert Behavior During Therapy: WFL for tasks assessed/performed Overall Cognitive Status: Within Functional Limits for tasks assessed                                        General  Comments      Exercises     Assessment/Plan    PT Assessment Patent does not need any further PT services  PT Problem List         PT Treatment Interventions      PT Goals (Current goals can be found in the Care Plan section)  Acute Rehab PT Goals Patient Stated Goal: decrease gut pain PT Goal Formulation: All assessment and education complete, DC therapy    Frequency     Barriers to discharge        Co-evaluation               AM-PAC PT "6 Clicks" Mobility  Outcome Measure Help needed turning from your back to your side while in a flat bed without using bedrails?: None Help needed moving from lying on your back to sitting on the side of a flat bed without using bedrails?: None Help needed moving to and from a bed to a chair (including a wheelchair)?: None Help needed standing up from a chair using your arms (e.g., wheelchair or bedside chair)?: None Help needed to walk in hospital room?: None Help needed climbing 3-5 steps with a railing? : A Little 6 Click Score: 23    End of Session Equipment Utilized During Treatment: Gait belt Activity Tolerance: Patient tolerated treatment well Patient left: in bed;with call bell/phone within reach Nurse Communication: Mobility status (safe to ambulate with family)      Time: 0240-9735 PT Time Calculation (min) (ACUTE ONLY): 14 min   Charges:   PT Evaluation $PT Eval Low Complexity: 1 Low          Ruffus Kamaka, PT Acute Rehab Services Pager [phone] [facility] 11/29/2020, 10:34 AM

## 2020-11-30 DIAGNOSIS — K529 Noninfective gastroenteritis and colitis, unspecified: Secondary | ICD-10-CM | POA: Diagnosis not present

## 2020-11-30 DIAGNOSIS — R197 Diarrhea, unspecified: Secondary | ICD-10-CM | POA: Diagnosis not present

## 2020-11-30 DIAGNOSIS — C20 Malignant neoplasm of rectum: Secondary | ICD-10-CM | POA: Diagnosis not present

## 2020-11-30 DIAGNOSIS — C7800 Secondary malignant neoplasm of unspecified lung: Secondary | ICD-10-CM | POA: Diagnosis not present

## 2020-11-30 MED ORDER — SACCHAROMYCES BOULARDII 250 MG PO CAPS
250.0000 mg | ORAL_CAPSULE | Freq: Two times a day (BID) | ORAL | Status: DC
  Administered 2020-11-30 – 2020-12-06 (×12): 250 mg via ORAL
  Filled 2020-11-30 (×12): qty 1

## 2020-11-30 MED ORDER — HYDROMORPHONE HCL 1 MG/ML IJ SOLN
1.0000 mg | Freq: Three times a day (TID) | INTRAMUSCULAR | Status: DC | PRN
  Administered 2020-11-30 – 2020-12-02 (×5): 1 mg via INTRAVENOUS
  Filled 2020-11-30 (×5): qty 1

## 2020-11-30 NOTE — Plan of Care (Signed)

## 2020-11-30 NOTE — Progress Notes (Addendum)
PROGRESS NOTE    Tina Morales  YBW:[id number] DOB: 17-Apr-1966 DOA: 11/26/2020 PCP: Ladell Pier, MD    Brief Narrative:  Tina Morales was admitted to the hospital working diagnosis of worsening diarrhea and abdominal pain in the setting of recent entorocolitis.  55 year old female past medical history for rectal cancer, tobacco abuse, iron deficiency anemia, migraines, hypertension and polyneuropathy who presented with nausea vomiting and diarrhea. Recent hospitalization for enteritis/colitis, from 05/10 to 5/12.Completed course of antibiotic therapy with Augmentin on 5/17. At home she had recurrent gastrointestinal symptoms, that prompted her to come back to the hospital. On her initial physical examination she was hemodynamically stable, her lungs are clear to auscultation bilaterally, heart S1-S2, present, rhythmic, her abdomen was soft, nontender nondistended, left lower quadrant colostomy bag present, no lower extremity edema.  Sodium 132, potassium 3.6, chloride 96, bicarb 27, glucose 121, BUN 7, creatinine 0.33, white count 9.8, hemoglobin 9.4, hematocrit 36.8, platelets 287.  Patient placed on supportive medical therapy with IV analgesics and antiemetics.  No endoscopic intervention recommended per GI.  Patient continue to have persistent abdominal pian and nausea. She has been frustrated due to persistent symptoms.  Plan to achieve pain control in order to allow appropriate nutrition.  Her symptoms continue to improve with better pain control and now tolerating po well. Plan for possible dc home in the next 24 hrs.    Assessment & Plan:   Principal Problem:   Colitis Active Problems:   Rectal cancer ([place])   Anemia of chronic disease   Metastatic cancer to lung ([place])   Diarrhea     1. Enteritis/ colitis,GI panel negative including C diff, Abdominal pain continue to improve, but has positive distention this am. Continue to have bowel movements and passing  gas.  Her po intake has improved, she is getting food from home with good toleration.   Over last 24 hrs has used 3 mg of hydromorphone but no hydrocodone.  Plan to try first hydrocodone before using hydromorphone for acute pain, continue with bid extended release oxycodone 10 mg.   Add probiotics and will discontinue dicyclomine for now. Encourage to ambulate in the hallway.   If pain continues to be well controlled with oral analgesics and she continue to tolerate po diet plan is for dc home tomorrow.   2. Hypokalemia/ hyponatremia/hypomagensemia.resolved, tolerating po well. Continue mag supplements.   3. Rectal cancer. Sp abdominal perineal resection and vaginectomy on 2020.Positive pulmonary metastasis. Case discussed with Dr Benay Spice, will continue supportive medical therapy for now. Plan for outpatient follow up.    4. GERD Onpantoprazole,   5. Anxiety.Continue alprazolam 1 mg tid prn.   6. Anemia of chronic disease. Follow up cell count as outpatient    Status is: Inpatient  Remains inpatient appropriate because:Inpatient level of care appropriate due to severity of illness   Dispo: The patient is from: Home              Anticipated d/c is to: Home              Patient currently is not medically stable to d/c. Plan for possible dc home tomorrow.    Difficult to place patient No    DVT prophylaxis: Enoxaparin   Code Status:   full  Family Communication:  No family at the bedside      Nutrition Status: Nutrition Problem: Increased nutrient needs Etiology: cancer and cancer related treatments Signs/Symptoms: estimated needs Interventions: Boost Breeze,Ensure Enlive (each supplement provides 350kcal and 20  grams of protein)     Consultants:   GI   Oncology     Subjective: Patient is feeling better, no nausea or vomiting, abdominal pain has improved but not yet back to baseline, last night not able to sleep until early this morning.    Objective: Vitals:   11/29/20 1604 11/29/20 2123 11/30/20 0135 11/30/20 0542  BP: 120/82 127/89 (!) 142/98 105/77  Pulse: 99 (!) 124 (!) 123 (!) 109  Resp: 18 18 18 18   Temp: 98.6 F (37 C) 98.9 F (37.2 C) 98.6 F (37 C) 98.2 F (36.8 C)  TempSrc: Oral Oral Oral Oral  SpO2:  93% 94% 96%  Weight:        Intake/Output Summary (Last 24 hours) at 11/30/2020 1036 Last data filed at 11/30/2020 9024 Gross per 24 hour  Intake 20 ml  Output 150 ml  Net -130 ml   Filed Weights   11/27/20 0426 11/28/20 0500 11/29/20 0524  Weight: 60.5 kg 63.6 kg 70.3 kg    Examination:   General: Not in pain or dyspnea  Neurology: Awake and alert, non focal  E ENT: mild pallor, no icterus, oral mucosa moist Cardiovascular: No JVD. S1-S2 present, rhythmic, no gallops, rubs, or murmurs. No lower extremity edema. Pulmonary: positive breath sounds bilaterally, adequate air movement, no wheezing, rhonchi or rales. Gastrointestinal. Abdomen distended mild to moderate, tympanic, colostomy bag present with stool and gas present.  Skin. No rashes Musculoskeletal: no joint deformities     Data Reviewed: I have personally reviewed following labs and imaging studies  CBC: Recent Labs  Lab 11/26/20 0919 11/28/20 0534  WBC 9.8 9.7  NEUTROABS 7.8* 7.2  HGB 11.4* 9.9*  HCT 36.8 32.5*  MCV 69.6* 70.0*  PLT 287 097   Basic Metabolic Panel: Recent Labs  Lab 11/26/20 0919 11/27/20 0533 11/28/20 0534  NA 132* 132* 135  K 3.6 4.2 4.3  CL 96* 99 104  CO2 27 24 28   GLUCOSE 121* 91 92  BUN 7 6 7   CREATININE 0.38* <0.30* <0.30*  CALCIUM 8.7* 8.1* 8.1*  MG  --  2.1  --   PHOS  --  3.6  --    GFR: CrCl cannot be calculated (This lab value cannot be used to calculate CrCl because it is not a number: <0.30). Liver Function Tests: Recent Labs  Lab 11/26/20 0919  AST 33  ALT 10  ALKPHOS 253*  BILITOT 1.0  PROT 5.6*  ALBUMIN 3.3*   No results for input(s): LIPASE, AMYLASE in the last 168  hours. No results for input(s): AMMONIA in the last 168 hours. Coagulation Profile: No results for input(s): INR, PROTIME in the last 168 hours. Cardiac Enzymes: No results for input(s): CKTOTAL, CKMB, CKMBINDEX, TROPONINI in the last 168 hours. BNP (last 3 results) No results for input(s): PROBNP in the last 8760 hours. HbA1C: No results for input(s): HGBA1C in the last 72 hours. CBG: No results for input(s): GLUCAP in the last 168 hours. Lipid Profile: No results for input(s): CHOL, HDL, LDLCALC, TRIG, CHOLHDL, LDLDIRECT in the last 72 hours. Thyroid Function Tests: No results for input(s): TSH, T4TOTAL, FREET4, T3FREE, THYROIDAB in the last 72 hours. Anemia Panel: No results for input(s): VITAMINB12, FOLATE, FERRITIN, TIBC, IRON, RETICCTPCT in the last 72 hours.    Radiology Studies: I have reviewed all of the imaging during this hospital visit personally     Scheduled Meds: . Chlorhexidine Gluconate Cloth  6 each Topical Daily  . dicyclomine  10 mg Oral TID AC & HS  . feeding supplement  1 Container Oral TID BM  . magnesium oxide  400 mg Oral BID  . oxyCODONE  10 mg Oral Q12H  . pantoprazole  40 mg Oral Daily  . polyethylene glycol  17 g Oral BID  . Ensure Max Protein  11 oz Oral BID   Continuous Infusions:   LOS: 4 days        Keilynn Marano Gerome Apley, MD

## 2020-11-30 NOTE — Progress Notes (Signed)
   11/30/20 1912  Vitals  Pulse Rate (!) 119  Resp 19  Level of Consciousness  Level of Consciousness Alert  MEWS COLOR  MEWS Score Color Yellow  Oxygen Therapy  SpO2 (!) 88 %  O2 Device Nasal Cannula  O2 Flow Rate (L/min) 2 L/min  Patient Activity (if Appropriate) In bed  Pain Assessment  Pain Score 8  Pain Type Acute pain  Pain Location Abdomen  Pain Descriptors / Indicators Cramping;Stabbing  Pain Frequency Intermittent  Pain Intervention(s) Medication (See eMAR)  PAINAD (Pain Assessment in Advanced Dementia)  Breathing 0  Negative Vocalization 0  Facial Expression 0  Body Language 0  Consolability 0  PAINAD Score 0  POSS Scale ([place] Opioid Sedation Scale)  POSS *See Group Information* 1-Acceptable,Awake and alert  MEWS Score  MEWS Temp 0  MEWS Systolic 0  MEWS Pulse 2  MEWS RR 0  MEWS LOC 0  MEWS Score 2  Provider Notification  Provider Name/Title Dr. Cathlean Sauer  Date Provider Notified 11/30/20  Time Provider Notified 1030  Notification Type Face-to-face  Notification Reason  (inform yellow to green mews all day)  Provider response No new orders  Date of Provider Response 11/30/20  Time of Provider Response 1030

## 2020-11-30 NOTE — Progress Notes (Signed)
Tina Morales ambulated in the hall with her husband and tolerated well. She then ambulated by herself in the hall and did well.

## 2020-12-01 ENCOUNTER — Inpatient Hospital Stay (HOSPITAL_COMMUNITY): Payer: BC Managed Care – PPO

## 2020-12-01 DIAGNOSIS — R109 Unspecified abdominal pain: Secondary | ICD-10-CM | POA: Diagnosis not present

## 2020-12-01 DIAGNOSIS — K529 Noninfective gastroenteritis and colitis, unspecified: Secondary | ICD-10-CM | POA: Diagnosis not present

## 2020-12-01 DIAGNOSIS — D638 Anemia in other chronic diseases classified elsewhere: Secondary | ICD-10-CM | POA: Diagnosis not present

## 2020-12-01 DIAGNOSIS — R112 Nausea with vomiting, unspecified: Secondary | ICD-10-CM | POA: Diagnosis not present

## 2020-12-01 DIAGNOSIS — R140 Abdominal distension (gaseous): Secondary | ICD-10-CM

## 2020-12-01 DIAGNOSIS — E860 Dehydration: Secondary | ICD-10-CM | POA: Diagnosis not present

## 2020-12-01 DIAGNOSIS — R197 Diarrhea, unspecified: Secondary | ICD-10-CM | POA: Diagnosis not present

## 2020-12-01 DIAGNOSIS — C20 Malignant neoplasm of rectum: Secondary | ICD-10-CM | POA: Diagnosis not present

## 2020-12-01 MED ORDER — OXYCODONE HCL 5 MG PO TABS
5.0000 mg | ORAL_TABLET | ORAL | Status: DC | PRN
  Administered 2020-12-01 (×2): 10 mg via ORAL
  Filled 2020-12-01 (×2): qty 2

## 2020-12-01 NOTE — Plan of Care (Signed)
Pt reports being comfortably last night; was able to get some sleep but c/o abdomen still distended and uncomfortable but well manage with Rx. Pt ambulated to bathroom on her own with no difficulty. Call light within reach and bed at lowest position for safety. No s/s of acute distress or pain reported or observed. Pt might be d/c today.

## 2020-12-01 NOTE — Progress Notes (Signed)
   12/01/20 1357  Vitals  Temp 97.7 F (36.5 C)  Temp Source Oral  BP 136/77  MAP (mmHg) 92  BP Location Left Arm  BP Method Automatic  Patient Position (if appropriate) Sitting  Pulse Rate (!) 124  Resp 16  MEWS COLOR  MEWS Score Color Yellow  Oxygen Therapy  SpO2 (!) 87 %  O2 Device Room Air  MEWS Score  MEWS Temp 0  MEWS Systolic 0  MEWS Pulse 2  MEWS RR 0  MEWS LOC 0  MEWS Score 2  Provider Notification  Provider Name/Title Dr. Cathlean Sauer  Date Provider Notified 12/01/20  Time Provider Notified 8115  Notification Type Face-to-face  Notification Reason Other (Comment) (Heart rate continues to  be Tachy)  Provider response No new orders  Date of Provider Response 12/01/20  Time of Provider Response 1400

## 2020-12-01 NOTE — Progress Notes (Signed)
   12/01/20 1357  Vitals  Temp 97.7 F (36.5 C)  Temp Source Oral  BP 136/77  MAP (mmHg) 92  BP Location Left Arm  BP Method Automatic  Patient Position (if appropriate) Sitting  Pulse Rate (!) 124  Resp 16  MEWS COLOR  MEWS Score Color Yellow  Oxygen Therapy  SpO2 (!) 87 %  O2 Device Room Air  MEWS Score  MEWS Temp 0  MEWS Systolic 0  MEWS Pulse 2  MEWS RR 0  MEWS LOC 0  MEWS Score 2  Provider Notification  Provider Name/Title Dr. Arrien  Date Provider Notified 12/01/20  Time Provider Notified 1357  Notification Type Face-to-face  Notification Reason Other (Comment) (Heart rate continues to  be Tachy)  Provider response No new orders  Date of Provider Response 12/01/20  Time of Provider Response 1400   

## 2020-12-01 NOTE — Progress Notes (Addendum)
HEMATOLOGY-ONCOLOGY PROGRESS NOTE  SUBJECTIVE: Abdominal pain persists.  She was started on OxyContin over the weekend.  Abdomen more distended this morning.  Reports mild nausea but no vomiting.  Stool output about the same.  Oncology History  Rectal cancer ([place])  04/27/2018 Initial Diagnosis   Rectal cancer ([place])   05/11/2018 -  Chemotherapy    Patient is on Treatment Plan: COLORECTAL FOLFOX + ZIRABEV Q 21 DAYS      06/13/2019 - 10/15/2020 Chemotherapy    Patient is on Treatment Plan: COLORECTAL FOLFOX + ZIRABEV Q 21 DAYS        PHYSICAL EXAMINATION:  Vitals:   11/30/20 2155 12/01/20 0531  BP: 100/75 121/82  Pulse: 79 (!) 117  Resp: 18 18  Temp: 98.9 F (37.2 C) 98.9 F (37.2 C)  SpO2: 91% 91%   Filed Weights   11/28/20 0500 11/29/20 0524 12/01/20 0531  Weight: 63.6 kg 70.3 kg 72.6 kg    Intake/Output from previous day: 05/22 0701 - 05/23 0700 In: 800 [P.O.:800] Out: 100 [Stool:100]  GENERAL:alert, no distress and comfortable OROPHARYNX:no exudate, no erythema and lips, buccal mucosa, and tongue normal  LUNGS: clear to auscultation and percussion with normal breathing effort HEART: Regular, tachycardia, no lower extremity edema ABDOMEN: Mildly distended, diffuse tenderness, left lower quadrant colostomy with small amount of liquid brown stool  NEURO: alert & oriented x 3 with fluent speech, no focal motor/sensory deficits  Port-A-Cath without erythema  LABORATORY DATA:  I have reviewed the data as listed CMP Latest Ref Rng & Units 11/28/2020 11/27/2020 11/26/2020  Glucose 70 - 99 mg/dL 92 91 121(H)  BUN 6 - 20 mg/dL 7 6 7   Creatinine 0.44 - 1.00 mg/dL <0.30(L) <0.30(L) 0.38(L)  Sodium 135 - 145 mmol/L 135 132(L) 132(L)  Potassium 3.5 - 5.1 mmol/L 4.3 4.2 3.6  Chloride 98 - 111 mmol/L 104 99 96(L)  CO2 22 - 32 mmol/L 28 24 27   Calcium 8.9 - 10.3 mg/dL 8.1(L) 8.1(L) 8.7(L)  Total Protein 6.5 - 8.1 g/dL - - 5.6(L)  Total Bilirubin 0.3 - 1.2 mg/dL - - 1.0   Alkaline Phos 38 - 126 U/L - - 253(H)  AST 15 - 41 U/L - - 33  ALT 0 - 44 U/L - - 10    Lab Results  Component Value Date   WBC 9.7 11/28/2020   HGB 9.9 (L) 11/28/2020   HCT 32.5 (L) 11/28/2020   MCV 70.0 (L) 11/28/2020   PLT 215 11/28/2020   NEUTROABS 7.2 11/28/2020    CT CHEST W CONTRAST  Result Date: 11/18/2020 CLINICAL DATA:  Abdominal pain for 1 week. History of colon cancer. On chemotherapy. Prior colostomy. EXAM: CT CHEST, ABDOMEN, AND PELVIS WITH CONTRAST TECHNIQUE: Multidetector CT imaging of the chest, abdomen and pelvis was performed following the standard protocol during bolus administration of intravenous contrast. CONTRAST:  46m OMNIPAQUE IOHEXOL 300 MG/ML  SOLN COMPARISON:  CT 09/01/2020 FINDINGS: CT CHEST FINDINGS Cardiovascular: Port in the anterior chest wall with tip in distal SVC. Coronary artery calcification and aortic atherosclerotic calcification. Mediastinum/Nodes: No axillary or supraclavicular adenopathy. No mediastinal or hilar adenopathy. No pericardial fluid. Esophagus normal. Lungs/Pleura: Bilateral pulmonary nodules again demonstrated. Nodules appear very similar to CT 09/01/2020. Example nodule in the superior segment of the RIGHT lower lobe measures 9 mm (image 55) compared to 10 mm on prior. Nodule in the RIGHT upper lobe measures 9 mm (image 57) compared with 9 mm. Nodule in the lateral aspect of the LEFT lower lobe  measures 6 mm (image 85) compared with 5 mm. Peripheral LEFT upper lobe nodule measures 5 mm (image 67) compared with 6 mm. No new nodules are identified. Musculoskeletal: No aggressive osseous lesion. CT ABDOMEN AND PELVIS FINDINGS Hepatobiliary: No focal hepatic lesion. Small amount pericholecystic fluid is new from prior (image 16/series 2). No intrahepatic duct dilatation. Common bile duct normal caliber. Pancreas: Pancreas is normal. No ductal dilatation. No pancreatic inflammation. Spleen: Spleen normal Adrenals/urinary tract: Adrenal glands  and kidneys are normal. The ureters and bladder normal. Stomach/Bowel: Stomach and duodenum normal. There is mild submucosal edema within the loops of the small bowel deep within the pelvis (image 107/series 602). The small amount of fluid within the pelvis which is similar prior. There is fluid stool throughout the colon. The distal colon leading up to the ostomy demonstrates sub mucosal edema and mucosal enhancement (image 90/62 for example). There is mucosal enhancement through the colon exiting the ostomy. No evidence of local recurrence in the proctectomy site. There is no evidence of bowel obstruction. Vascular/Lymphatic: Abdominal aorta is normal caliber with atherosclerotic calcification. There is no retroperitoneal or periportal lymphadenopathy. No pelvic lymphadenopathy. Reproductive: Post hysterectomy.  Adnexa unremarkable Other: Along the posterior margin of the RIGHT hepatic lobe there is peritoneal thickening with potential enhancement. Small amount fluid at this site on comparison exam however the enhancement is new (image 63/62). Nodular enhancement measures 10 mm x 7 mm image 64/602). Nodularity also seen on coronal image 24/605) Musculoskeletal: No aggressive osseous lesion. IMPRESSION: Chest Impression: 1. Stable bilateral pulmonary nodular metastasis. 2. No mediastinal lymphadenopathy. Abdomen / Pelvis Impression: 1. Submucosal edema and mucosal enhancement involving the distal colon leading up to the colostomy as well as loops of small bowel in the pelvis. Findings are most consistent with colitis and enteritis. No high-grade obstruction. a 2. Concern for subtle peritoneal nodular enhancement along the posterior aspect the RIGHT hepatic lobe. Recommend close attention on follow-up with CT scan or FDG PET scan. These results will be called to the ordering clinician or representative by the Radiologist Assistant, and communication documented in the PACS or [employer]. Electronically Signed    By: Suzy Bouchard M.D.   On: 11/18/2020 16:42   CT Abdomen Pelvis W Contrast  Result Date: 11/18/2020 CLINICAL DATA:  Abdominal pain for 1 week. History of colon cancer. On chemotherapy. Prior colostomy. EXAM: CT CHEST, ABDOMEN, AND PELVIS WITH CONTRAST TECHNIQUE: Multidetector CT imaging of the chest, abdomen and pelvis was performed following the standard protocol during bolus administration of intravenous contrast. CONTRAST:  6m OMNIPAQUE IOHEXOL 300 MG/ML  SOLN COMPARISON:  CT 09/01/2020 FINDINGS: CT CHEST FINDINGS Cardiovascular: Port in the anterior chest wall with tip in distal SVC. Coronary artery calcification and aortic atherosclerotic calcification. Mediastinum/Nodes: No axillary or supraclavicular adenopathy. No mediastinal or hilar adenopathy. No pericardial fluid. Esophagus normal. Lungs/Pleura: Bilateral pulmonary nodules again demonstrated. Nodules appear very similar to CT 09/01/2020. Example nodule in the superior segment of the RIGHT lower lobe measures 9 mm (image 55) compared to 10 mm on prior. Nodule in the RIGHT upper lobe measures 9 mm (image 57) compared with 9 mm. Nodule in the lateral aspect of the LEFT lower lobe measures 6 mm (image 85) compared with 5 mm. Peripheral LEFT upper lobe nodule measures 5 mm (image 67) compared with 6 mm. No new nodules are identified. Musculoskeletal: No aggressive osseous lesion. CT ABDOMEN AND PELVIS FINDINGS Hepatobiliary: No focal hepatic lesion. Small amount pericholecystic fluid is new from prior (image 16/series  2). No intrahepatic duct dilatation. Common bile duct normal caliber. Pancreas: Pancreas is normal. No ductal dilatation. No pancreatic inflammation. Spleen: Spleen normal Adrenals/urinary tract: Adrenal glands and kidneys are normal. The ureters and bladder normal. Stomach/Bowel: Stomach and duodenum normal. There is mild submucosal edema within the loops of the small bowel deep within the pelvis (image 107/series 602). The small  amount of fluid within the pelvis which is similar prior. There is fluid stool throughout the colon. The distal colon leading up to the ostomy demonstrates sub mucosal edema and mucosal enhancement (image 90/62 for example). There is mucosal enhancement through the colon exiting the ostomy. No evidence of local recurrence in the proctectomy site. There is no evidence of bowel obstruction. Vascular/Lymphatic: Abdominal aorta is normal caliber with atherosclerotic calcification. There is no retroperitoneal or periportal lymphadenopathy. No pelvic lymphadenopathy. Reproductive: Post hysterectomy.  Adnexa unremarkable Other: Along the posterior margin of the RIGHT hepatic lobe there is peritoneal thickening with potential enhancement. Small amount fluid at this site on comparison exam however the enhancement is new (image 63/62). Nodular enhancement measures 10 mm x 7 mm image 64/602). Nodularity also seen on coronal image 24/605) Musculoskeletal: No aggressive osseous lesion. IMPRESSION: Chest Impression: 1. Stable bilateral pulmonary nodular metastasis. 2. No mediastinal lymphadenopathy. Abdomen / Pelvis Impression: 1. Submucosal edema and mucosal enhancement involving the distal colon leading up to the colostomy as well as loops of small bowel in the pelvis. Findings are most consistent with colitis and enteritis. No high-grade obstruction. a 2. Concern for subtle peritoneal nodular enhancement along the posterior aspect the RIGHT hepatic lobe. Recommend close attention on follow-up with CT scan or FDG PET scan. These results will be called to the ordering clinician or representative by the Radiologist Assistant, and communication documented in the PACS or [employer]. Electronically Signed   By: Suzy Bouchard M.D.   On: 11/18/2020 16:42   DG Chest Port 1 View  Result Date: 11/18/2020 CLINICAL DATA:  Abdominal pain and vomiting for 1 week. History of colon cancer. EXAM: PORTABLE CHEST 1 VIEW COMPARISON:   CT chest, abdomen and pelvis 09/01/2020. FINDINGS: Right IJ approach Port-A-Cath is in place. The lungs are emphysematous. A few scattered pulmonary nodules consistent with known metastatic disease are seen. No consolidative process, pneumothorax or effusion. Heart size is normal. Aortic atherosclerosis. IMPRESSION: No acute disease. Pulmonary nodules consistent with known metastatic disease. Aortic Atherosclerosis (ICD10-I70.0) and Emphysema (ICD10-J43.9). Electronically Signed   By: Inge Rise M.D.   On: 11/18/2020 14:02   DG Abd 2 Views  Result Date: 11/28/2020 CLINICAL DATA:  Colon carcinoma.  Abdominal pain EXAM: ABDOMEN - 2 VIEW COMPARISON:  CT abdomen and pelvis Nov 18, 2020 FINDINGS: Supine and upright images were obtained. There is mild stool volume in the colon. No appreciable bowel dilatation or air-fluid levels to suggest bowel obstruction. No free air. There is mild left base atelectasis. Lung bases otherwise clear. Bibasilar nodular opacities consistent with metastatic foci. IMPRESSION: No evident bowel obstruction or free air. Mild left base atelectasis. Nodular opacities in the lung bases consistent with metastases, noted on recent CT as well. Electronically Signed   By: Lowella Grip III M.D.   On: 11/28/2020 11:13    ASSESSMENT AND PLAN: 1. Rectal cancer-rectal mass noted on digital exam 02/10/2018, colonoscopy confirmed a him my circumferential mass in the rectum ? 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 CTs 02/10/2018-anterior rectal wall thickening, pulmonary nodules measuring up to 9 mm concerning for metastases, few round perirectal lymph nodes ? Pelvic MRI 02/20/2018-hypermetabolic low rectal mass extending to the posterior vagina with 2  small enlarged perirectal lymph nodes, T4b,N1-2.3 cm from the anal verge ? PET scan 02/23/2018-hypermetabolic rectal mass, 8 mm hypermetabolic lingular nodule, scattered small bilateral lung nodules, some calcified, a few with mildly increased activity ? Cycle 1 FOLFOXIRI8/21/2019 ? Cycle 5 FOLFOXIRI10/16/2019 ? CTs 05/01/2018-significant interval response to therapy with decreased size of the primary rectal mass lesion. Decreasing perirectal lymphadenopathy. Decreased and/or resolved pulmonary nodules. No new sites of disease identified. ? Cycle 6 FOLFOXIRI10/31/2019 ? Radiation/Xeloda 06/12/2018-completed 07/21/2018 ? Xeloda dose reduced 07/10/2018 due to mucositis and diarrhea ? CTs 08/07/2018-compared to 02/10/2018-resolved and decreased pulmonary nodules, few tiny residual noncalcified nodules, decreased 20 perirectal lymph nodes, soft tissue of the rectum indistinguishable from posterior wall of vagina-Vaginal involvement by tumor? ? 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 CT chest 09/29/2018-enlargement of lung nodules, not a candidate for SBRT based on discussion in GI tumor conference and with radiation oncology ? CT chest 11/27/18 - interval growth of numerous pulmonary metastases bilaterally compared to 09/29/18, no new pulmonary mets ? Cycle 1FOLFOXIRI 11/29/18 ? Cycle 2 FOLFOXIRI6/10/2018 ? Cycle 3 FOLFOXIRI 12/28/2018 ? Cycle 4 FOLFIRINOX 01/11/2019 ? CTs 01/24/2019-significant improvement of bilateral pulmonary metastases. ? Cycle 1 FOLFIRI 02/01/2019 ? Cycle 2 FOLFIRI 02/22/2019 ? Cycle 3 FOLFIRI 03/15/2019 ? Cycle 4 FOLFIRI 04/05/2019 ? Cycle 5 FOLFIRI 04/26/2019 ? CTs 05/14/2019-slight enlargement of right lung nodules, no other evidence of disease  progression ? Cycle 6 FOLFIRI 05/17/2019 ? Cycle 7 FOLFIRI plus Avastin 06/13/2019 ? Cycle 8 FOLFIRI plus Avastin 07/09/2019 ? Cycle 9 FOLFIRI plus Avastin 07/25/2019 ? Cycle 10 FOLFIRI plus Avastin 08/09/2019 ? Cycle 11 FOLFIRI plus Avastin 08/22/2019 ? CTs 09/07/2019-most pulmonary nodules are stable. 1 nodule right lower lobe slightly larger. No new lung lesions. ? Cycle 12 FOLFIRI plus Avastin 09/13/2019 ? Cycle 13 FOLFIRI plus Avastin 10/04/2019 ? Cycle 14 FOLFIRI plus Avastin 10/25/2019 ? Cycle 15 FOLFIRI plus Avastin 11/14/2019 ? Cycle 16 FOLFIRI plus Avastin 12/06/2019 ? CTs neck, chest, abdomen, pelvis 12/21/2019-neck negative; mildly progressive pulmonary metastases; abdomen and pelvis negative for evidence of metastatic disease. ? Cycle 1 FOLFOX/Avastin 12/27/2019 ? Cycle 2 FOLFOX/Avastin 01/17/2020 ? Cycle 3 FOLFOX/Avastin 02/07/2020 ? Cycle 4 FOLFOX 02/28/2020, Avastin held due to need for dental evaluation possible extractions ? Cycle 5 FOLFOX 03/20/2020, Avastin held pending dental evaluation ? CTs 04/04/2020-increased size of pulmonary nodules, no evidence of metastatic disease in the abdomen or pelvis ? Cycle 6 FOLFOX 05/22/2020, Avastin held secondary to dental extractions ? Cycle 7 FOLFOX/Avastin 06/12/2020 ? Cycle 8 FOLFOX/Avastin 06/30/2020 ? Cycle 9 FOLFOX/Avastin 07/24/2020 ? Cycle 10 FOLFOX/Avastin 08/14/2020 ? CTs 09/01/2020-improvement in bilateral pulmonary metastases. ? Cycle 11 FOLFOX/Avastin 09/04/2020 ? Cycle 12 FOLFOX/Avastin 09/25/2020 ? Cycle 13 FOLFOX/Avastin 10/15/2020 ? Cycle 14 FOLFOX/Avastin 11/05/2020 ? 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 Imodium as needed. 6.Mucositis secondary to Xeloda. Improved 07/19/2018. 7. 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 Admission 11/18/2020 with nausea/vomiting, fever, and abdominal pain-CT consistent with enteritis/colitis 12.  Admission 11/26/2020 with nausea, abdominal pain, loose stool, and dehydration 13.  Hospital admission 11/26/2020- nausea and diarrhea  Ms. [place] appears unchanged.  She has persistent abdominal pain.  Has been started on OxyContin and uses hydrocodone for breakthrough pain.  Hydrocodone not fully effective.  We will trial her on oxycodone.  Oxycodone not effective, will consider oral Dilaudid.  The etiology of her symptoms remains unclear.  Doubt symptoms are related to colitis from chemotherapy.  Concerned abdominal pain could be related to unrecognized carcinomatosis or early bowel obstruction.  Abdomen with increased distention this morning.  Will order ultrasound-guided paracentesis and send fluid for cytology.  Recommendations: 1.  Continue OxyContin.  We will discontinue hydrocodone and try oxycodone 5 to 10 mg every 4 hours as needed for moderate pain.  She may continue to use IV Dilaudid for severe pain. 2.  Order entered for ultrasound-guided paracentesis.  Fluid to be sent for cytology.   LOS: 5 days   Mikey Bussing, DNP, AGPCNP-BC, AOCNP 12/01/20 Ms. Lorence was interviewed and examined.  I discussed the case with Dr. Dema Severin over the weekend.  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  The abdomen is more distended this morning we will refer her for an ultrasound and diagnostic/therapeutic paracentesis if there is significant ascites.  We will plan for an outpatient PET scan if she does not have significant  ascites.  She does not feel she is getting adequate pain relief with the current narcotic regimen.  We will add oxycodone for breakthrough pain.  I was present for greater than 50% of today's visit.  I performed medical decision making.  Julieanne Manson, MD

## 2020-12-01 NOTE — Progress Notes (Signed)
PROGRESS NOTE    Tina Morales  TDV:[id number] DOB: 1965/12/26 DOA: 11/26/2020 PCP: Ladell Pier, MD    Brief Narrative:  Tina Morales was admitted to the hospital working diagnosis of worsening diarrhea and abdominal pain in the setting of recent entorocolitis.  55 year old female past medical history for rectal cancer, tobacco abuse, iron deficiency anemia, migraines, hypertension and polyneuropathy who presented with nausea vomiting and diarrhea. Recent hospitalization for enteritis/colitis, from 05/10 to 5/12.Completed course of antibiotic therapy with Augmentin on 5/17. At home she had recurrent gastrointestinal symptoms, that prompted her to come back to the hospital. On her initial physical examination she was hemodynamically stable, her lungs are clear to auscultation bilaterally, heart S1-S2, present, rhythmic, her abdomen was soft, nontender nondistended, left lower quadrant colostomy bag present, no lower extremity edema.  Sodium 132, potassium 3.6, chloride 96, bicarb 27, glucose 121, BUN 7, creatinine 0.33, white count 9.8, hemoglobin 9.4, hematocrit 36.8, platelets 287.  Tina Morales placed on supportive medical therapy with IV analgesics and antiemetics.  No endoscopic intervention recommended per GI.  Tina Morales continue to have persistent abdominal pian and nausea. She has been frustrated due to persistent symptoms.  Plan to achieve pain control in order to allow appropriate nutrition.  Her symptoms continue to improve with better pain control and now tolerating po well.  Plan for [place] with possible paracentesis today.    Assessment & Plan:   Principal Problem:   Colitis Active Problems:   Rectal cancer ([place])   Anemia of chronic disease   Metastatic cancer to lung ([place])   Diarrhea     1. Enteritis/ colitis,GI panel negative including C diff, Tolerating po well with no nausea or vomiting. Continue to pass gas and stool per colostomy.   Continue to have  pain, that has improved with opiate analgesics.    She required only 1 mg of hydromorphone yesterday.  Continue bid Oxycodone 10 mg ER, hydrocodone change to IR oxycodone.   Continue with probiotics and antiacids. Out of bed to chair with with meals and continue to encourage ambulation. Bowel regimen with Miralax.   Plan for diagnostic US abdomen and possible paracentesis.   2. Hypokalemia/ hyponatremia/hypomagensemia.Tina Morales tolerating po well,  Electrolyte abnormalities have resolved.  Follow BMP in am.   3. Rectal cancer. Sp abdominal perineal resection and vaginectomy on 2020.Positive pulmonary metastasis. Case discussed with Dr Benay Spice, will continue supportive medical therapy for now.  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   4. GERD Continue withpantoprazole,   5. Anxiety.On as needed alprazolam 1 mg tid.   6. Anemia of chronic disease. cell count has been stable. Follow cell count in am.     Status is: Inpatient  Remains inpatient appropriate because:Inpatient level of care appropriate due to severity of illness   Dispo: The Tina Morales is from: Home              Anticipated d/c is to: Home              Tina Morales currently is not medically stable to d/c. Possible dc home in the next 24 hrs.    Difficult to place Tina Morales No   DVT prophylaxis: Enoxaparin   Code Status:   full  Family Communication:  I spoke with Tina Morales's husband at the bedside, we talked in detail about Tina Morales's condition, plan of care and prognosis and all questions were addressed.      Nutrition Status: Nutrition Problem: Increased nutrient needs Etiology: cancer and cancer related treatments Signs/Symptoms: estimated needs Interventions: Boost Melene Muller (  each supplement provides 350kcal and 20 grams of protein)      Consultants:   Oncology    Subjective: Tina Morales continue to have abdominal pain and distention, improved with oral analgesics, no nausea or  vomiting, continue to pass gas and stool per colostomy,   Objective: Vitals:   11/30/20 1912 11/30/20 1921 11/30/20 2155 12/01/20 0531  BP:   100/75 121/82  Pulse: (!) 119  79 (!) 117  Resp: 19  18 18   Temp:   98.9 F (37.2 C) 98.9 F (37.2 C)  TempSrc:   Oral Oral  SpO2: (!) 88% 91% 91% 91%  Weight:    72.6 kg    Intake/Output Summary (Last 24 hours) at 12/01/2020 1052 Last data filed at 12/01/2020 8527 Gross per 24 hour  Intake 320 ml  Output 100 ml  Net 220 ml   Filed Weights   11/28/20 0500 11/29/20 0524 12/01/20 0531  Weight: 63.6 kg 70.3 kg 72.6 kg    Examination:   General: Not in pain or dyspnea, deconditioned  Neurology: Awake and alert, non focal  E ENT: no pallor, no icterus, oral mucosa moist Cardiovascular: No JVD. S1-S2 present, rhythmic, no gallops, rubs, or murmurs. No lower extremity edema. Pulmonary: vesicular breath sounds bilaterally, adequate air movement, no wheezing, rhonchi or rales. Gastrointestinal. Abdomen soft, mild distended, tender to deep palpation, colostomy bag in place with soft stool.  Skin. No rashes Musculoskeletal: no joint deformities     Data Reviewed: I have personally reviewed following labs and imaging studies  CBC: Recent Labs  Lab 11/26/20 0919 11/28/20 0534  WBC 9.8 9.7  NEUTROABS 7.8* 7.2  HGB 11.4* 9.9*  HCT 36.8 32.5*  MCV 69.6* 70.0*  PLT 287 782   Basic Metabolic Panel: Recent Labs  Lab 11/26/20 0919 11/27/20 0533 11/28/20 0534  NA 132* 132* 135  K 3.6 4.2 4.3  CL 96* 99 104  CO2 27 24 28   GLUCOSE 121* 91 92  BUN 7 6 7   CREATININE 0.38* <0.30* <0.30*  CALCIUM 8.7* 8.1* 8.1*  MG  --  2.1  --   PHOS  --  3.6  --    GFR: CrCl cannot be calculated (This lab value cannot be used to calculate CrCl because it is not a number: <0.30). Liver Function Tests: Recent Labs  Lab 11/26/20 0919  AST 33  ALT 10  ALKPHOS 253*  BILITOT 1.0  PROT 5.6*  ALBUMIN 3.3*   No results for input(s): LIPASE,  AMYLASE in the last 168 hours. No results for input(s): AMMONIA in the last 168 hours. Coagulation Profile: No results for input(s): INR, PROTIME in the last 168 hours. Cardiac Enzymes: No results for input(s): CKTOTAL, CKMB, CKMBINDEX, TROPONINI in the last 168 hours. BNP (last 3 results) No results for input(s): PROBNP in the last 8760 hours. HbA1C: No results for input(s): HGBA1C in the last 72 hours. CBG: No results for input(s): GLUCAP in the last 168 hours. Lipid Profile: No results for input(s): CHOL, HDL, LDLCALC, TRIG, CHOLHDL, LDLDIRECT in the last 72 hours. Thyroid Function Tests: No results for input(s): TSH, T4TOTAL, FREET4, T3FREE, THYROIDAB in the last 72 hours. Anemia Panel: No results for input(s): VITAMINB12, FOLATE, FERRITIN, TIBC, IRON, RETICCTPCT in the last 72 hours.    Radiology Studies: I have reviewed all of the imaging during this hospital visit personally     Scheduled Meds: . Chlorhexidine Gluconate Cloth  6 each Topical Daily  . feeding supplement  1 Container Oral TID  BM  . magnesium oxide  400 mg Oral BID  . oxyCODONE  10 mg Oral Q12H  . pantoprazole  40 mg Oral Daily  . polyethylene glycol  17 g Oral BID  . Ensure Max Protein  11 oz Oral BID  . saccharomyces boulardii  250 mg Oral BID   Continuous Infusions:   LOS: 5 days        Bevin Mayall Gerome Apley, MD

## 2020-12-02 DIAGNOSIS — C7801 Secondary malignant neoplasm of right lung: Secondary | ICD-10-CM | POA: Diagnosis not present

## 2020-12-02 DIAGNOSIS — C7802 Secondary malignant neoplasm of left lung: Secondary | ICD-10-CM | POA: Diagnosis not present

## 2020-12-02 DIAGNOSIS — R109 Unspecified abdominal pain: Secondary | ICD-10-CM

## 2020-12-02 LAB — BASIC METABOLIC PANEL
Anion gap: 9 (ref 5–15)
BUN: <5 mg/dL — ABNORMAL LOW (ref 6–20)
CO2: 26 mmol/L (ref 22–32)
Calcium: 8.2 mg/dL — ABNORMAL LOW (ref 8.9–10.3)
Chloride: 96 mmol/L — ABNORMAL LOW (ref 98–111)
Creatinine, Ser: 0.45 mg/dL (ref 0.44–1.00)
GFR, Estimated: >60 mL/min (ref >60)
Glucose, Bld: 103 mg/dL — ABNORMAL HIGH (ref 70–99)
Potassium: 3.3 mmol/L — ABNORMAL LOW (ref 3.5–5.1)
Sodium: 131 mmol/L — ABNORMAL LOW (ref 135–145)

## 2020-12-02 LAB — CBC WITH DIFFERENTIAL/PLATELET
Abs Immature Granulocytes: 0.07 10*3/uL (ref 0.00–0.07)
Basophils Absolute: 0 10*3/uL (ref 0.0–0.1)
Basophils Relative: 0 %
Eosinophils Absolute: 0.1 10*3/uL (ref 0.0–0.5)
Eosinophils Relative: 0 %
HCT: 31.7 % — ABNORMAL LOW (ref 36.0–46.0)
Hemoglobin: 9.7 g/dL — ABNORMAL LOW (ref 12.0–15.0)
Immature Granulocytes: 1 %
Lymphocytes Relative: 7 %
Lymphs Abs: 1 10*3/uL (ref 0.7–4.0)
MCH: 21.3 pg — ABNORMAL LOW (ref 26.0–34.0)
MCHC: 30.6 g/dL (ref 30.0–36.0)
MCV: 69.5 fL — ABNORMAL LOW (ref 80.0–100.0)
Monocytes Absolute: 2.2 10*3/uL — ABNORMAL HIGH (ref 0.1–1.0)
Monocytes Relative: 16 %
Neutro Abs: 10.2 10*3/uL — ABNORMAL HIGH (ref 1.7–7.7)
Neutrophils Relative %: 76 %
Platelets: 300 10*3/uL (ref 150–400)
RBC: 4.56 MIL/uL (ref 3.87–5.11)
RDW: 20 % — ABNORMAL HIGH (ref 11.5–15.5)
WBC: 13.5 10*3/uL — ABNORMAL HIGH (ref 4.0–10.5)
nRBC: 0.2 % (ref 0.0–0.2)

## 2020-12-02 MED ORDER — HYDROMORPHONE HCL 2 MG PO TABS
2.0000 mg | ORAL_TABLET | ORAL | Status: DC | PRN
  Administered 2020-12-02 – 2020-12-06 (×10): 2 mg via ORAL
  Filled 2020-12-02 (×10): qty 1

## 2020-12-02 MED ORDER — SORBITOL 70 % SOLN
30.0000 mL | Freq: Two times a day (BID) | Status: DC
  Administered 2020-12-02 – 2020-12-06 (×7): 30 mL via ORAL
  Filled 2020-12-02 (×9): qty 30

## 2020-12-02 MED ORDER — HYDROMORPHONE HCL 1 MG/ML IJ SOLN
1.0000 mg | INTRAMUSCULAR | Status: DC | PRN
  Administered 2020-12-02 – 2020-12-03 (×5): 1 mg via INTRAVENOUS
  Filled 2020-12-02 (×5): qty 1

## 2020-12-02 MED ORDER — SUCRALFATE 1 GM/10ML PO SUSP
1.0000 g | Freq: Three times a day (TID) | ORAL | Status: DC
  Administered 2020-12-02 – 2020-12-03 (×3): 1 g via ORAL
  Filled 2020-12-02 (×3): qty 10

## 2020-12-02 MED ORDER — DOCUSATE SODIUM 100 MG PO CAPS
100.0000 mg | ORAL_CAPSULE | Freq: Two times a day (BID) | ORAL | Status: DC
Start: 2020-12-02 — End: 2020-12-06
  Administered 2020-12-02 – 2020-12-06 (×9): 100 mg via ORAL
  Filled 2020-12-02 (×9): qty 1

## 2020-12-02 MED ORDER — POTASSIUM CHLORIDE CRYS ER 20 MEQ PO TBCR
40.0000 meq | EXTENDED_RELEASE_TABLET | ORAL | Status: AC
  Administered 2020-12-02 (×2): 40 meq via ORAL
  Filled 2020-12-02 (×2): qty 2

## 2020-12-02 NOTE — Progress Notes (Addendum)
HEMATOLOGY-ONCOLOGY PROGRESS NOTE  SUBJECTIVE: Continues to have abdominal pain this morning.  Oxycodone caused nausea. Continues to have decreased stool output from her ostomy.  Oncology History  Rectal cancer ([place])  04/27/2018 Initial Diagnosis   Rectal cancer ([place])   05/11/2018 -  Chemotherapy    Patient is on Treatment Plan: COLORECTAL FOLFOX + ZIRABEV Q 21 DAYS      06/13/2019 - 10/15/2020 Chemotherapy    Patient is on Treatment Plan: COLORECTAL FOLFOX + ZIRABEV Q 21 DAYS        PHYSICAL EXAMINATION:  Vitals:   12/01/20 2158 12/02/20 0531  BP: 103/79 120/87  Pulse: (!) 122 (!) 121  Resp: 20 20  Temp: 98.6 F (37 C) 98.4 F (36.9 C)  SpO2: 94% 93%   Filed Weights   11/29/20 0524 12/01/20 0531 12/02/20 0531  Weight: 70.3 kg 72.6 kg 68.7 kg    Intake/Output from previous day: 05/23 0701 - 05/24 0700 In: 71 [P.O.:237] Out: 84 [Stool:80]  GENERAL:alert, no distress and comfortable OROPHARYNX:no exudate, no erythema and lips, buccal mucosa, and tongue normal  LUNGS: clear to auscultation and percussion with normal breathing effort HEART: Regular, tachycardia, no lower extremity edema ABDOMEN: Mildly distended, diffuse tenderness, left lower quadrant colostomy with small amount of liquid brown stool  NEURO: alert & oriented x 3 with fluent speech, no focal motor/sensory deficits  Port-A-Cath without erythema  LABORATORY DATA:  I have reviewed the data as listed CMP Latest Ref Rng & Units 12/02/2020 11/28/2020 11/27/2020  Glucose 70 - 99 mg/dL 103(H) 92 91  BUN 6 - 20 mg/dL <5(L) 7 6  Creatinine 0.44 - 1.00 mg/dL 0.45 <0.30(L) <0.30(L)  Sodium 135 - 145 mmol/L 131(L) 135 132(L)  Potassium 3.5 - 5.1 mmol/L 3.3(L) 4.3 4.2  Chloride 98 - 111 mmol/L 96(L) 104 99  CO2 22 - 32 mmol/L _0 Calcium 8.9 - 10.3 mg/dL 8.2(L) 8.1(L) 8.1(L)  Total Protein 6.5 - 8.1 g/dL - - -  Total Bilirubin 0.3 - 1.2 mg/dL - - -  Alkaline Phos 38 - 126 U/L - - -  AST 15 - 41 U/L -  - -  ALT 0 - 44 U/L - - -    Lab Results  Component Value Date   WBC 13.5 (H) 12/02/2020   HGB 9.7 (L) 12/02/2020   HCT 31.7 (L) 12/02/2020   MCV 69.5 (L) 12/02/2020   PLT 300 12/02/2020   NEUTROABS 10.2 (H) 12/02/2020    CT CHEST W CONTRAST  Result Date: 11/18/2020 CLINICAL DATA:  Abdominal pain for 1 week. History of colon cancer. On chemotherapy. Prior colostomy. EXAM: CT CHEST, ABDOMEN, AND PELVIS WITH CONTRAST TECHNIQUE: Multidetector CT imaging of the chest, abdomen and pelvis was performed following the standard protocol during bolus administration of intravenous contrast. CONTRAST:  33m OMNIPAQUE IOHEXOL 300 MG/ML  SOLN COMPARISON:  CT 09/01/2020 FINDINGS: CT CHEST FINDINGS Cardiovascular: Port in the anterior chest wall with tip in distal SVC. Coronary artery calcification and aortic atherosclerotic calcification. Mediastinum/Nodes: No axillary or supraclavicular adenopathy. No mediastinal or hilar adenopathy. No pericardial fluid. Esophagus normal. Lungs/Pleura: Bilateral pulmonary nodules again demonstrated. Nodules appear very similar to CT 09/01/2020. Example nodule in the superior segment of the RIGHT lower lobe measures 9 mm (image 55) compared to 10 mm on prior. Nodule in the RIGHT upper lobe measures 9 mm (image 57) compared with 9 mm. Nodule in the lateral aspect of the LEFT lower lobe measures 6 mm (image 85) compared with 5  mm. Peripheral LEFT upper lobe nodule measures 5 mm (image 67) compared with 6 mm. No new nodules are identified. Musculoskeletal: No aggressive osseous lesion. CT ABDOMEN AND PELVIS FINDINGS Hepatobiliary: No focal hepatic lesion. Small amount pericholecystic fluid is new from prior (image 16/series 2). No intrahepatic duct dilatation. Common bile duct normal caliber. Pancreas: Pancreas is normal. No ductal dilatation. No pancreatic inflammation. Spleen: Spleen normal Adrenals/urinary tract: Adrenal glands and kidneys are normal. The ureters and bladder  normal. Stomach/Bowel: Stomach and duodenum normal. There is mild submucosal edema within the loops of the small bowel deep within the pelvis (image 107/series 602). The small amount of fluid within the pelvis which is similar prior. There is fluid stool throughout the colon. The distal colon leading up to the ostomy demonstrates sub mucosal edema and mucosal enhancement (image 90/62 for example). There is mucosal enhancement through the colon exiting the ostomy. No evidence of local recurrence in the proctectomy site. There is no evidence of bowel obstruction. Vascular/Lymphatic: Abdominal aorta is normal caliber with atherosclerotic calcification. There is no retroperitoneal or periportal lymphadenopathy. No pelvic lymphadenopathy. Reproductive: Post hysterectomy.  Adnexa unremarkable Other: Along the posterior margin of the RIGHT hepatic lobe there is peritoneal thickening with potential enhancement. Small amount fluid at this site on comparison exam however the enhancement is new (image 63/62). Nodular enhancement measures 10 mm x 7 mm image 64/602). Nodularity also seen on coronal image 24/605) Musculoskeletal: No aggressive osseous lesion. IMPRESSION: Chest Impression: 1. Stable bilateral pulmonary nodular metastasis. 2. No mediastinal lymphadenopathy. Abdomen / Pelvis Impression: 1. Submucosal edema and mucosal enhancement involving the distal colon leading up to the colostomy as well as loops of small bowel in the pelvis. Findings are most consistent with colitis and enteritis. No high-grade obstruction. a 2. Concern for subtle peritoneal nodular enhancement along the posterior aspect the RIGHT hepatic lobe. Recommend close attention on follow-up with CT scan or FDG PET scan. These results will be called to the ordering clinician or representative by the Radiologist Assistant, and communication documented in the PACS or [employer]. Electronically Signed   By: Suzy Bouchard M.D.   On: 11/18/2020  16:42   CT Abdomen Pelvis W Contrast  Result Date: 11/18/2020 CLINICAL DATA:  Abdominal pain for 1 week. History of colon cancer. On chemotherapy. Prior colostomy. EXAM: CT CHEST, ABDOMEN, AND PELVIS WITH CONTRAST TECHNIQUE: Multidetector CT imaging of the chest, abdomen and pelvis was performed following the standard protocol during bolus administration of intravenous contrast. CONTRAST:  77mL OMNIPAQUE IOHEXOL 300 MG/ML  SOLN COMPARISON:  CT 09/01/2020 FINDINGS: CT CHEST FINDINGS Cardiovascular: Port in the anterior chest wall with tip in distal SVC. Coronary artery calcification and aortic atherosclerotic calcification. Mediastinum/Nodes: No axillary or supraclavicular adenopathy. No mediastinal or hilar adenopathy. No pericardial fluid. Esophagus normal. Lungs/Pleura: Bilateral pulmonary nodules again demonstrated. Nodules appear very similar to CT 09/01/2020. Example nodule in the superior segment of the RIGHT lower lobe measures 9 mm (image 55) compared to 10 mm on prior. Nodule in the RIGHT upper lobe measures 9 mm (image 57) compared with 9 mm. Nodule in the lateral aspect of the LEFT lower lobe measures 6 mm (image 85) compared with 5 mm. Peripheral LEFT upper lobe nodule measures 5 mm (image 67) compared with 6 mm. No new nodules are identified. Musculoskeletal: No aggressive osseous lesion. CT ABDOMEN AND PELVIS FINDINGS Hepatobiliary: No focal hepatic lesion. Small amount pericholecystic fluid is new from prior (image 16/series 2). No intrahepatic duct dilatation. Common bile duct  normal caliber. Pancreas: Pancreas is normal. No ductal dilatation. No pancreatic inflammation. Spleen: Spleen normal Adrenals/urinary tract: Adrenal glands and kidneys are normal. The ureters and bladder normal. Stomach/Bowel: Stomach and duodenum normal. There is mild submucosal edema within the loops of the small bowel deep within the pelvis (image 107/series 602). The small amount of fluid within the pelvis which is  similar prior. There is fluid stool throughout the colon. The distal colon leading up to the ostomy demonstrates sub mucosal edema and mucosal enhancement (image 90/62 for example). There is mucosal enhancement through the colon exiting the ostomy. No evidence of local recurrence in the proctectomy site. There is no evidence of bowel obstruction. Vascular/Lymphatic: Abdominal aorta is normal caliber with atherosclerotic calcification. There is no retroperitoneal or periportal lymphadenopathy. No pelvic lymphadenopathy. Reproductive: Post hysterectomy.  Adnexa unremarkable Other: Along the posterior margin of the RIGHT hepatic lobe there is peritoneal thickening with potential enhancement. Small amount fluid at this site on comparison exam however the enhancement is new (image 63/62). Nodular enhancement measures 10 mm x 7 mm image 64/602). Nodularity also seen on coronal image 24/605) Musculoskeletal: No aggressive osseous lesion. IMPRESSION: Chest Impression: 1. Stable bilateral pulmonary nodular metastasis. 2. No mediastinal lymphadenopathy. Abdomen / Pelvis Impression: 1. Submucosal edema and mucosal enhancement involving the distal colon leading up to the colostomy as well as loops of small bowel in the pelvis. Findings are most consistent with colitis and enteritis. No high-grade obstruction. a 2. Concern for subtle peritoneal nodular enhancement along the posterior aspect the RIGHT hepatic lobe. Recommend close attention on follow-up with CT scan or FDG PET scan. These results will be called to the ordering clinician or representative by the Radiologist Assistant, and communication documented in the PACS or [employer]. Electronically Signed   By: Suzy Bouchard M.D.   On: 11/18/2020 16:42   DG Chest Port 1 View  Result Date: 11/18/2020 CLINICAL DATA:  Abdominal pain and vomiting for 1 week. History of colon cancer. EXAM: PORTABLE CHEST 1 VIEW COMPARISON:  CT chest, abdomen and pelvis 09/01/2020.  FINDINGS: Right IJ approach Port-A-Cath is in place. The lungs are emphysematous. A few scattered pulmonary nodules consistent with known metastatic disease are seen. No consolidative process, pneumothorax or effusion. Heart size is normal. Aortic atherosclerosis. IMPRESSION: No acute disease. Pulmonary nodules consistent with known metastatic disease. Aortic Atherosclerosis (ICD10-I70.0) and Emphysema (ICD10-J43.9). Electronically Signed   By: Inge Rise M.D.   On: 11/18/2020 14:02   DG Abd 2 Views  Result Date: 11/28/2020 CLINICAL DATA:  Colon carcinoma.  Abdominal pain EXAM: ABDOMEN - 2 VIEW COMPARISON:  CT abdomen and pelvis Nov 18, 2020 FINDINGS: Supine and upright images were obtained. There is mild stool volume in the colon. No appreciable bowel dilatation or air-fluid levels to suggest bowel obstruction. No free air. There is mild left base atelectasis. Lung bases otherwise clear. Bibasilar nodular opacities consistent with metastatic foci. IMPRESSION: No evident bowel obstruction or free air. Mild left base atelectasis. Nodular opacities in the lung bases consistent with metastases, noted on recent CT as well. Electronically Signed   By: Lowella Grip III M.D.   On: 11/28/2020 11:13   [place] ASCITES (ABDOMEN LIMITED)  Result Date: 12/01/2020 CLINICAL DATA:  Colorectal cancer, abdominal distension, assess for therapeutic paracentesis EXAM: LIMITED ABDOMEN ULTRASOUND FOR ASCITES TECHNIQUE: Limited ultrasound survey for ascites was performed in all four abdominal quadrants. COMPARISON:  11/18/2020 FINDINGS: Survey ultrasound performed of the abdominal 4 quadrants. Small amount of right upper quadrant  perihepatic ascites. No large volume ascites appreciated that warrants therapeutic paracentesis. Procedure not performed. IMPRESSION: Small amount of right upper quadrant perihepatic ascites. Electronically Signed   By: Jerilynn Mages.  Shick M.D.   On: 12/01/2020 12:05    ASSESSMENT AND PLAN: 1. Rectal  cancer-rectal mass noted on digital exam 02/10/2018, colonoscopy confirmed a him my circumferential mass in the rectum ? 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 CTs 02/10/2018-anterior rectal wall thickening, pulmonary nodules measuring up to 9 mm concerning for metastases, few round perirectal lymph nodes ? Pelvic MRI 02/20/2018-hypermetabolic low rectal mass extending to the posterior vagina with 2 small enlarged perirectal lymph nodes, T4b,N1-2.3 cm from the anal verge ? PET scan 02/23/2018-hypermetabolic rectal mass, 8 mm hypermetabolic lingular nodule, scattered small bilateral lung nodules, some calcified, a few with mildly increased activity ? Cycle 1 FOLFOXIRI8/21/2019 ? Cycle 5 FOLFOXIRI10/16/2019 ? CTs 05/01/2018-significant interval response to therapy with decreased size of the primary rectal mass lesion. Decreasing perirectal lymphadenopathy. Decreased and/or resolved pulmonary nodules. No new sites of disease identified. ? Cycle 6 FOLFOXIRI10/31/2019 ? Radiation/Xeloda 06/12/2018-completed 07/21/2018 ? Xeloda dose reduced 07/10/2018 due to mucositis and diarrhea ? CTs 08/07/2018-compared to 02/10/2018-resolved and decreased pulmonary nodules, few tiny residual noncalcified nodules, decreased 20 perirectal lymph nodes, soft tissue of the rectum indistinguishable from posterior wall of vagina-Vaginal involvement by tumor? ? 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 CT chest 09/29/2018-enlargement of lung nodules, not a  candidate for SBRT based on discussion in GI tumor conference and with radiation oncology ? CT chest 11/27/18 - interval growth of numerous pulmonary metastases bilaterally compared to 09/29/18, no new pulmonary mets ? Cycle 1FOLFOXIRI 11/29/18 ? Cycle 2 FOLFOXIRI6/10/2018 ? Cycle 3 FOLFOXIRI 12/28/2018 ? Cycle 4 FOLFIRINOX 01/11/2019 ? CTs 01/24/2019-significant improvement of bilateral pulmonary metastases. ? Cycle 1 FOLFIRI 02/01/2019 ? Cycle 2 FOLFIRI 02/22/2019 ? Cycle 3 FOLFIRI 03/15/2019 ? Cycle 4 FOLFIRI 04/05/2019 ? Cycle 5 FOLFIRI 04/26/2019 ? CTs 05/14/2019-slight enlargement of right lung nodules, no other evidence of disease progression ? Cycle 6 FOLFIRI 05/17/2019 ? Cycle 7 FOLFIRI plus Avastin 06/13/2019 ? Cycle 8 FOLFIRI plus Avastin 07/09/2019 ? Cycle 9 FOLFIRI plus Avastin 07/25/2019 ? Cycle 10 FOLFIRI plus Avastin 08/09/2019 ? Cycle 11 FOLFIRI plus Avastin 08/22/2019 ? CTs 09/07/2019-most pulmonary nodules are stable. 1 nodule right lower lobe slightly larger. No new lung lesions. ? Cycle 12 FOLFIRI plus Avastin 09/13/2019 ? Cycle 13 FOLFIRI plus Avastin 10/04/2019 ? Cycle 14 FOLFIRI plus Avastin 10/25/2019 ? Cycle 15 FOLFIRI plus Avastin 11/14/2019 ? Cycle 16 FOLFIRI plus Avastin 12/06/2019 ? CTs neck, chest, abdomen, pelvis 12/21/2019-neck negative; mildly progressive pulmonary metastases; abdomen and pelvis negative for evidence of metastatic disease. ? Cycle 1 FOLFOX/Avastin 12/27/2019 ? Cycle 2 FOLFOX/Avastin 01/17/2020 ? Cycle 3 FOLFOX/Avastin 02/07/2020 ? Cycle 4 FOLFOX 02/28/2020, Avastin held due to need for dental evaluation possible extractions ? Cycle 5 FOLFOX 03/20/2020, Avastin held pending dental evaluation ? CTs 04/04/2020-increased size of pulmonary nodules, no evidence of metastatic disease in the abdomen or pelvis ? Cycle 6 FOLFOX 05/22/2020, Avastin held secondary to dental extractions ? Cycle 7 FOLFOX/Avastin 06/12/2020 ? Cycle 8 FOLFOX/Avastin 06/30/2020 ? Cycle 9  FOLFOX/Avastin 07/24/2020 ? Cycle 10 FOLFOX/Avastin  08/14/2020 ? CTs 09/01/2020-improvement in bilateral pulmonary metastases. ? Cycle 11 FOLFOX/Avastin 09/04/2020 ? Cycle 12 FOLFOX/Avastin 09/25/2020 ? Cycle 13 FOLFOX/Avastin 10/15/2020 ? Cycle 14 FOLFOX/Avastin 11/05/2020 ? 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 Imodium as needed. 6.Mucositis secondary to Xeloda. Improved 07/19/2018. 7. 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 Admission 11/18/2020 with nausea/vomiting, fever, and abdominal pain-CT consistent with enteritis/colitis 12.  Admission 11/26/2020 with nausea, abdominal pain, loose stool, and dehydration 13.  Hospital admission 11/26/2020- nausea and diarrhea  Tina Morales appears unchanged.  She has persistent abdominal pain.  She remains on OxyContin.  She was started on oxycodone for breakthrough pain which caused nausea.  We will discontinue that today and try her on oral Dilaudid.  The etiology of her symptoms remains unclear.  Doubt symptoms are related to colitis from chemotherapy.  Concerned abdominal pain could be related to unrecognized carcinomatosis or early bowel obstruction.  Will need PET scan as an outpatient.  She went for ultrasound-guided paracentesis yesterday but there was not enough fluid to take off.  Still has decreased to output from ostomy.  May have an ileus. Will adjust stool softeners and  laxatives.  Recommendations: 1.  Continue OxyContin.  Discontinue oxycodone secondary to nausea.  Will try oral Dilaudid 2 mg every 4 hours as needed for pain.  IV Dilaudid still available to her. 2.  Continue MiraLAX twice a day.  I have added Colace 100 mg twice a day and sorbitol 30 cc twice a day. 3.  I advised the patient and her husband that if she is able to eat and drink, her ostomy has increased output, and her abdominal pain is controlled with oral medication, she may be discharged home. 4.  Will need outpatient PET scan.   LOS: 6 days   Mikey Bussing, DNP, AGPCNP-BC, AOCNP 12/02/20 Ms. Porada was interviewed and examined.  She continues to have abdominal pain and nausea.  She feels the short acting oxycodone is causing nausea.  We will change her to oral Dilaudid for breakthrough pain.  The etiology of the abdominal pain and distention remains unclear.  An ultrasound yesterday did not reveal significant ascites.  Her symptoms may be in part related to ileus/constipation.  We will increase the laxative regimen today.  It remains possible she has carcinomatosis.  She will be scheduled for an outpatient PET scan.  I was present for greater than 50% of today's visit.  I performed medical decision making.

## 2020-12-02 NOTE — Progress Notes (Signed)
PROGRESS NOTE    Tina Morales  FGH:[id number] DOB: 10-Apr-1966 DOA: 11/26/2020 PCP: Ladell Pier, MD    Brief Narrative:  Tina Morales was admitted to the hospital with the working diagnosis of worsening diarrhea and abdominal painin the setting of recent entorocolitis. Possible cancer related persistent abdominal pain.   55 year old female past medical history for rectal cancer, tobacco abuse, iron deficiency anemia, migraines, hypertension and polyneuropathy who presented with nausea vomiting and diarrhea. Recent hospitalization for enteritis/colitis, from 05/10 to 5/12.Completed course of antibiotic therapy with Augmentin on 5/17. At home she had recurrent gastrointestinal symptoms, that prompted her to come back to the hospital. On her initial physical examination she was hemodynamically stable, her lungs were clear to auscultation bilaterally, heart S1-S2, present, rhythmic, her abdomen was soft, nontender nondistended, left lower quadrant colostomy bag present, no lower extremity edema.  Sodium 132, potassium 3.6, chloride 96, bicarb 27, glucose 121, BUN 7, creatinine 0.33, white count 9.8, hemoglobin 9.4, hematocrit 36.8, platelets 287.  Patient placed on supportive medical therapy with IV analgesics and antiemetics.  No endoscopic intervention recommended per GI.  Patient continue to have persistent abdominal pian and nausea. She has been frustrated due to persistent symptoms.  Plan to achieve pain control in order to allow appropriate nutrition.  Her symptoms continue to be persistent, with intermittent abdominal distention but no further vomiting. Tolerating po well. On different regimens of oral and IV opiate analgesics.  Abdominal US with no ascites.   Consulted palliative care for further pain control.    Assessment & Plan:   Principal Problem:   Abdominal pain Active Problems:   Rectal cancer (HCC)   Anemia of chronic disease   Metastatic cancer to lung  (HCC)   Colitis   Diarrhea    1. SP Enteritis/ colitis now with persistent and refractive abdominal pain. GI panel negative including C diff. GI recommended to continue medical therapy, no invasive procedures.   She has been tolerating po, her home food. No vomiting but occasional nausea.  Persistent abdominal pain, despite IV and oral opiate analgesics.  US abdomen with no ascites.   Continue supportive medical care with antiacids, as needed antiemetics and pain control with oxycodone and hydromorphone.  Her hear rate has been elevated during her hospitalization.    Possible malignancy related abdominal pain, carcinomatosis. Consulted palliative for further recommendations.   2. Hypokalemia/ hyponatremia/hypomagensemia.follow up renal function continue to be stable. K is down to 3,3 and Na at 131. Will continue to encourage po intake and will add 40 meq Kcl x2. Follow up on renal panel in am, including MG.   3. Rectal cancer. Sp abdominal perineal resection and vaginectomy on 2020.Positive pulmonary metastasis. Case discussed with Dr Benay Spice, will continue supportive medical therapy for now.  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  Reactive leukocytosis up to 13,5. Will continue follow up cell count in am, if worsening may need to repeat CT of the abdomen and pelvis.   4. GERDOn pantoprazole and will add sucralfate,   5. Anxiety. Continue with alprazolam 1 mg tid as needed.   6. Anemia of chronic disease. hgb has been stable at 9,7 with Hct at 31,7. Plt 300.      Status is: Inpatient  Remains inpatient appropriate because:Inpatient level of care appropriate due to severity of illness   Dispo: The patient is from: Home              Anticipated d/c is to: Home  Patient currently is not medically stable to d/c.   Difficult to place patient No   DVT prophylaxis: Enoxaparin  Code Status:    full  Family Communication:  No family at the  bedside      Nutrition Status: Nutrition Problem: Increased nutrient needs Etiology: cancer and cancer related treatments Signs/Symptoms: estimated needs Interventions: Boost Breeze,Ensure Enlive (each supplement provides 350kcal and 20 grams of protein)      Consultants:   Oncology   GI     Subjective: Patient continue to have abdominal pain, with occasional nausea, no vomiting, no chest pain or dyspnea.   Objective: Vitals:   12/01/20 0531 12/01/20 1357 12/01/20 2158 12/02/20 0531  BP: 121/82 136/77 103/79 120/87  Pulse: (!) 117 (!) 124 (!) 122 (!) 121  Resp: 18 16 20 20   Temp: 98.9 F (37.2 C) 97.7 F (36.5 C) 98.6 F (37 C) 98.4 F (36.9 C)  TempSrc: Oral Oral Oral Oral  SpO2: 91% (!) 87% 94% 93%  Weight: 72.6 kg   68.7 kg    Intake/Output Summary (Last 24 hours) at 12/02/2020 1150 Last data filed at 12/02/2020 0729 Gross per 24 hour  Intake --  Output 80 ml  Net -80 ml   Filed Weights   11/29/20 0524 12/01/20 0531 12/02/20 0531  Weight: 70.3 kg 72.6 kg 68.7 kg    Examination:   General: Not in pain or dyspnea  Neurology: Awake and alert, non focal  E ENT: positive pallor, no icterus, oral mucosa moist Cardiovascular: No JVD. S1-S2 present, rhythmic, no gallops, rubs, or murmurs. No lower extremity edema. Pulmonary: positive breath sounds bilaterally, adequate air movement, no wheezing, rhonchi or rales. Gastrointestinal. Abdomen mild distended, non tender to superficial palpation, colostomy bag in place with soft stool and gas present. No rebound or guarding.  Skin. No rashes Musculoskeletal: no joint deformities     Data Reviewed: I have personally reviewed following labs and imaging studies  CBC: Recent Labs  Lab 11/26/20 0919 11/28/20 0534 12/02/20 0504  WBC 9.8 9.7 13.5*  NEUTROABS 7.8* 7.2 10.2*  HGB 11.4* 9.9* 9.7*  HCT 36.8 32.5* 31.7*  MCV 69.6* 70.0* 69.5*  PLT 287 215 357   Basic Metabolic Panel: Recent Labs  Lab  11/26/20 0919 11/27/20 0533 11/28/20 0534 12/02/20 0504  NA 132* 132* 135 131*  K 3.6 4.2 4.3 3.3*  CL 96* 99 104 96*  CO2 27 24 28 26   GLUCOSE 121* 91 92 103*  BUN 7 6 7  <5*  CREATININE 0.38* <0.30* <0.30* 0.45  CALCIUM 8.7* 8.1* 8.1* 8.2*  MG  --  2.1  --   --   PHOS  --  3.6  --   --    GFR: Estimated Creatinine Clearance: 83 mL/min (by C-G formula based on SCr of 0.45 mg/dL). Liver Function Tests: Recent Labs  Lab 11/26/20 0919  AST 33  ALT 10  ALKPHOS 253*  BILITOT 1.0  PROT 5.6*  ALBUMIN 3.3*   No results for input(s): LIPASE, AMYLASE in the last 168 hours. No results for input(s): AMMONIA in the last 168 hours. Coagulation Profile: No results for input(s): INR, PROTIME in the last 168 hours. Cardiac Enzymes: No results for input(s): CKTOTAL, CKMB, CKMBINDEX, TROPONINI in the last 168 hours. BNP (last 3 results) No results for input(s): PROBNP in the last 8760 hours. HbA1C: No results for input(s): HGBA1C in the last 72 hours. CBG: No results for input(s): GLUCAP in the last 168 hours. Lipid Profile: No  results for input(s): CHOL, HDL, LDLCALC, TRIG, CHOLHDL, LDLDIRECT in the last 72 hours. Thyroid Function Tests: No results for input(s): TSH, T4TOTAL, FREET4, T3FREE, THYROIDAB in the last 72 hours. Anemia Panel: No results for input(s): VITAMINB12, FOLATE, FERRITIN, TIBC, IRON, RETICCTPCT in the last 72 hours.    Radiology Studies: I have reviewed all of the imaging during this hospital visit personally     Scheduled Meds: . Chlorhexidine Gluconate Cloth  6 each Topical Daily  . docusate sodium  100 mg Oral BID  . feeding supplement  1 Container Oral TID BM  . magnesium oxide  400 mg Oral BID  . oxyCODONE  10 mg Oral Q12H  . pantoprazole  40 mg Oral Daily  . polyethylene glycol  17 g Oral BID  . Ensure Max Protein  11 oz Oral BID  . saccharomyces boulardii  250 mg Oral BID  . sorbitol  30 mL Oral BID   Continuous Infusions:   LOS: 6 days         Francyne Arreaga Gerome Apley, MD

## 2020-12-03 ENCOUNTER — Inpatient Hospital Stay (HOSPITAL_COMMUNITY): Payer: BC Managed Care – PPO

## 2020-12-03 DIAGNOSIS — Z515 Encounter for palliative care: Secondary | ICD-10-CM

## 2020-12-03 DIAGNOSIS — C7802 Secondary malignant neoplasm of left lung: Secondary | ICD-10-CM | POA: Diagnosis not present

## 2020-12-03 DIAGNOSIS — C7801 Secondary malignant neoplasm of right lung: Secondary | ICD-10-CM | POA: Diagnosis not present

## 2020-12-03 DIAGNOSIS — C20 Malignant neoplasm of rectum: Secondary | ICD-10-CM | POA: Diagnosis not present

## 2020-12-03 DIAGNOSIS — R52 Pain, unspecified: Secondary | ICD-10-CM

## 2020-12-03 LAB — CBC WITH DIFFERENTIAL/PLATELET
Abs Immature Granulocytes: 0.09 10*3/uL — ABNORMAL HIGH (ref 0.00–0.07)
Basophils Absolute: 0 10*3/uL (ref 0.0–0.1)
Basophils Relative: 0 %
Eosinophils Absolute: 0 10*3/uL (ref 0.0–0.5)
Eosinophils Relative: 0 %
HCT: 32.8 % — ABNORMAL LOW (ref 36.0–46.0)
Hemoglobin: 10.2 g/dL — ABNORMAL LOW (ref 12.0–15.0)
Immature Granulocytes: 1 %
Lymphocytes Relative: 6 %
Lymphs Abs: 0.9 10*3/uL (ref 0.7–4.0)
MCH: 21.7 pg — ABNORMAL LOW (ref 26.0–34.0)
MCHC: 31.1 g/dL (ref 30.0–36.0)
MCV: 69.8 fL — ABNORMAL LOW (ref 80.0–100.0)
Monocytes Absolute: 2 10*3/uL — ABNORMAL HIGH (ref 0.1–1.0)
Monocytes Relative: 15 %
Neutro Abs: 10.4 10*3/uL — ABNORMAL HIGH (ref 1.7–7.7)
Neutrophils Relative %: 78 %
Platelets: 287 10*3/uL (ref 150–400)
RBC: 4.7 MIL/uL (ref 3.87–5.11)
RDW: 19.9 % — ABNORMAL HIGH (ref 11.5–15.5)
WBC: 13.5 10*3/uL — ABNORMAL HIGH (ref 4.0–10.5)
nRBC: 0.2 % (ref 0.0–0.2)

## 2020-12-03 LAB — BASIC METABOLIC PANEL
Anion gap: 10 (ref 5–15)
BUN: 6 mg/dL (ref 6–20)
CO2: 26 mmol/L (ref 22–32)
Calcium: 8.5 mg/dL — ABNORMAL LOW (ref 8.9–10.3)
Chloride: 95 mmol/L — ABNORMAL LOW (ref 98–111)
Creatinine, Ser: <0.3 mg/dL — ABNORMAL LOW (ref 0.44–1.00)
Glucose, Bld: 105 mg/dL — ABNORMAL HIGH (ref 70–99)
Potassium: 3.8 mmol/L (ref 3.5–5.1)
Sodium: 131 mmol/L — ABNORMAL LOW (ref 135–145)

## 2020-12-03 LAB — CBC
HCT: 33.3 % — ABNORMAL LOW (ref 36.0–46.0)
Hemoglobin: 10.4 g/dL — ABNORMAL LOW (ref 12.0–15.0)
MCH: 21.9 pg — ABNORMAL LOW (ref 26.0–34.0)
MCHC: 31.2 g/dL (ref 30.0–36.0)
MCV: 70.1 fL — ABNORMAL LOW (ref 80.0–100.0)
Platelets: 247 10*3/uL (ref 150–400)
RBC: 4.75 MIL/uL (ref 3.87–5.11)
RDW: 20.2 % — ABNORMAL HIGH (ref 11.5–15.5)
WBC: 13.3 10*3/uL — ABNORMAL HIGH (ref 4.0–10.5)
nRBC: 0.3 % — ABNORMAL HIGH (ref 0.0–0.2)

## 2020-12-03 LAB — MAGNESIUM: Magnesium: 2.3 mg/dL (ref 1.7–2.4)

## 2020-12-03 MED ORDER — ENOXAPARIN SODIUM 40 MG/0.4ML IJ SOSY
40.0000 mg | PREFILLED_SYRINGE | INTRAMUSCULAR | Status: DC
  Administered 2020-12-03 – 2020-12-05 (×3): 40 mg via SUBCUTANEOUS
  Filled 2020-12-03 (×3): qty 0.4

## 2020-12-03 MED ORDER — SODIUM CHLORIDE 0.9% FLUSH
10.0000 mL | INTRAVENOUS | Status: DC | PRN
  Administered 2020-12-03: 10 mL

## 2020-12-03 MED ORDER — HYDROMORPHONE HCL 1 MG/ML IJ SOLN
1.0000 mg | INTRAMUSCULAR | Status: DC | PRN
  Administered 2020-12-03 – 2020-12-06 (×7): 1 mg via INTRAVENOUS
  Filled 2020-12-03 (×7): qty 1

## 2020-12-03 MED ORDER — PROCHLORPERAZINE MALEATE 10 MG PO TABS
5.0000 mg | ORAL_TABLET | Freq: Once | ORAL | Status: AC
  Administered 2020-12-03: 5 mg via ORAL
  Filled 2020-12-03: qty 1

## 2020-12-03 MED ORDER — HEPARIN SOD (PORK) LOCK FLUSH 100 UNIT/ML IV SOLN
500.0000 [IU] | Freq: Once | INTRAVENOUS | Status: AC
  Administered 2020-12-03: 500 [IU] via INTRAVENOUS
  Filled 2020-12-03: qty 5

## 2020-12-03 MED ORDER — IOHEXOL 9 MG/ML PO SOLN
ORAL | Status: AC
  Administered 2020-12-03: 500 mL
  Filled 2020-12-03: qty 1000

## 2020-12-03 MED ORDER — ALTEPLASE 2 MG IJ SOLR
2.0000 mg | Freq: Once | INTRAMUSCULAR | Status: AC
  Administered 2020-12-03: 2 mg
  Filled 2020-12-03: qty 2

## 2020-12-03 MED ORDER — DICYCLOMINE HCL 10 MG PO CAPS
10.0000 mg | ORAL_CAPSULE | Freq: Three times a day (TID) | ORAL | Status: DC
  Administered 2020-12-03 – 2020-12-05 (×7): 10 mg via ORAL
  Filled 2020-12-03 (×7): qty 1

## 2020-12-03 MED ORDER — SODIUM CHLORIDE 0.9% FLUSH
10.0000 mL | Freq: Two times a day (BID) | INTRAVENOUS | Status: DC
  Administered 2020-12-03 – 2020-12-05 (×6): 10 mL

## 2020-12-03 MED ORDER — IOHEXOL 300 MG/ML  SOLN
100.0000 mL | Freq: Once | INTRAMUSCULAR | Status: AC | PRN
  Administered 2020-12-03: 100 mL via INTRAVENOUS

## 2020-12-03 NOTE — Progress Notes (Signed)
PROGRESS NOTE    Tina Morales  PTW:[id number] DOB: [birth year] DOA: 11/26/2020 PCP: Ladell Pier, MD    Brief Narrative:  55 year old female history of metastatic rectal CA, originally diagnosed in 2018, treated with multiple regimens of chemo and XRT -5/10 admitted with abdominal pain nausea, question of enteritis/colitis on CT, treated with supportive care antibiotics and discharge -Readmitted 5/18 from Dr. Gearldine Shown office with recurrent/persistent abdominal pain, nausea, diarrhea, poor p.o. intake. -Treated with supportive care, gastroenterology consulted as well who recommended supportive treatment only   Assessment & Plan:   Persistent abdominal pain, nausea -Diarrhea seems to have resolved now, C. difficile PCR is negative -Seen by gastroenterology, recommended supportive care -Continues to require IV Dilaudid every 4 hours, persistent nausea, no vomiting at this time -Etiology remains unclear, CT on 5/10 noted submucosal edema and colon thickening up to colostomy -Ultrasound notes only small amount of ascites -Add dicyclomine, will discuss with Dr. Benay Spice, ?  Repeat imaging -Continue laxatives  Hypokalemia/ hyponatremia/hypomagensemia. -Replaced  Metastatic rectal cancer. Sp abdominal perineal resection and vaginectomy on 2020.Positive pulmonary metastasis. -Oncology following, will discuss repeat imaging with contrast  GERDOn pantoprazole   Anxiety.  -Continue with alprazolam 1 mg tid as needed.   Anemia of chronic disease. -Stable  DVT prophylaxis: Enoxaparin  Code Status:    full  Family Communication:  Spouse at bedside  Status is: Inpatient  Remains inpatient appropriate because:Inpatient level of care appropriate due to severity of illness   Dispo: The patient is from: Home              Anticipated d/c is to: Home              Patient currently is not medically stable to d/c.   Difficult to place patient No  Nutrition Status: Nutrition  Problem: Increased nutrient needs Etiology: cancer and cancer related treatments Signs/Symptoms: estimated needs Interventions: Boost Breeze,Ensure Enlive (each supplement provides 350kcal and 20 grams of protein)    Consultants:   Oncology   GI     Subjective: -Continues to have some abdominal pain, requiring Dilaudid IV every 4 hours, drinking liquids, oral intake is poor, also complains of decreased ostomy output Objective: Vitals:   12/02/20 1422 12/02/20 1423 12/02/20 2001 12/03/20 0517  BP: 118/78 118/78 115/88 120/81  Pulse: (!) 110 (!) 115 (!) 122 (!) 129  Resp: 14 14 16 16   Temp: 98.3 F (36.8 C) 98.3 F (36.8 C) 97.7 F (36.5 C) 98.4 F (36.9 C)  TempSrc: Oral Oral Oral Oral  SpO2: 97% 97% 95% 91%  Weight:    71.6 kg    Intake/Output Summary (Last 24 hours) at 12/03/2020 1127 Last data filed at 12/03/2020 0900 Gross per 24 hour  Intake 240 ml  Output 200 ml  Net 40 ml   Filed Weights   12/01/20 0531 12/02/20 0531 12/03/20 0517  Weight: 72.6 kg 68.7 kg 71.6 kg    Examination:   General: Chronically ill female, sitting up in bed, AAOx3, uncomfortable appearing CVS: S1-S2, regular rate rhythm Lungs: Decreased breath sounds both bases Abdomen: Soft, mildly distended, mild lower quadrant tenderness Extremities: No edema skin: No rash on exposed skin,  Port-A-Cath noted  Data Reviewed: I have personally reviewed following labs and imaging studies  CBC: Recent Labs  Lab 11/28/20 0534 12/02/20 0504 12/03/20 0547  WBC 9.7 13.5* 13.5*  NEUTROABS 7.2 10.2* 10.4*  HGB 9.9* 9.7* 10.2*  HCT 32.5* 31.7* 32.8*  MCV 70.0* 69.5* 69.8*  PLT 215 300  762   Basic Metabolic Panel: Recent Labs  Lab 11/27/20 0533 11/28/20 0534 12/02/20 0504 12/03/20 0547  NA 132* 135 131* 131*  K 4.2 4.3 3.3* 3.8  CL 99 104 96* 95*  CO2 24 28 26 26   GLUCOSE 91 92 103* 105*  BUN 6 7 <5* 6  CREATININE <0.30* <0.30* 0.45 <0.30*  CALCIUM 8.1* 8.1* 8.2* 8.5*  MG 2.1  --    --  2.3  PHOS 3.6  --   --   --    GFR: CrCl cannot be calculated (This lab value cannot be used to calculate CrCl because it is not a number: <0.30). Liver Function Tests: No results for input(s): AST, ALT, ALKPHOS, BILITOT, PROT, ALBUMIN in the last 168 hours. No results for input(s): LIPASE, AMYLASE in the last 168 hours. No results for input(s): AMMONIA in the last 168 hours. Coagulation Profile: No results for input(s): INR, PROTIME in the last 168 hours. Cardiac Enzymes: No results for input(s): CKTOTAL, CKMB, CKMBINDEX, TROPONINI in the last 168 hours. BNP (last 3 results) No results for input(s): PROBNP in the last 8760 hours. HbA1C: No results for input(s): HGBA1C in the last 72 hours. CBG: No results for input(s): GLUCAP in the last 168 hours. Lipid Profile: No results for input(s): CHOL, HDL, LDLCALC, TRIG, CHOLHDL, LDLDIRECT in the last 72 hours. Thyroid Function Tests: No results for input(s): TSH, T4TOTAL, FREET4, T3FREE, THYROIDAB in the last 72 hours. Anemia Panel: No results for input(s): VITAMINB12, FOLATE, FERRITIN, TIBC, IRON, RETICCTPCT in the last 72 hours.    Radiology Studies: I have reviewed all of the imaging during this hospital visit personally     Scheduled Meds: . Chlorhexidine Gluconate Cloth  6 each Topical Daily  . dicyclomine  10 mg Oral TID AC  . docusate sodium  100 mg Oral BID  . feeding supplement  1 Container Oral TID BM  . magnesium oxide  400 mg Oral BID  . oxyCODONE  10 mg Oral Q12H  . pantoprazole  40 mg Oral Daily  . polyethylene glycol  17 g Oral BID  . Ensure Max Protein  11 oz Oral BID  . saccharomyces boulardii  250 mg Oral BID  . sorbitol  30 mL Oral BID  . sucralfate  1 g Oral TID WC & HS   Continuous Infusions:   LOS: 7 days   Domenic Polite, MD

## 2020-12-03 NOTE — Consult Note (Addendum)
[facility] Surgery Consult Note  Tina Morales 09/23/1965  [id number].    Requesting MD: Betsy Coder Chief Complaint/Reason for Consult: metastatic rectal cancer, abdominal pain, nausea, vomiting  HPI:  Tina Morales is a 55yo female with h/o Metastatic rectal cancer currently on chemotherapy and s/p APR with end colostomy (Dr. Dema Severin) and radical vaginectomy (Dr. Denman George) 09/01/2018, who was admitted to [facility] 11/26/20 with abdominal pain, nausea, vomiting, and fever.  She had a CT scan 5/10 which reports submucosal edema and mucosal enhancement involving the distal colon leading up to the colostomy as well as loops of small bowel in the pelvis most consistent with colitis and enteritis; no high-grade obstruction; concern for subtle peritoneal nodular enhancement along the posterior aspect the RIGHT hepatic lobe. Other infectious work up negative with negative CXR, u/a, Ucx, Bcx, G panel, and c diff.  States that she has had intermittent symptoms since February 2022. Initially the symptoms were short lived and self limiting, however, they have become gradually more severe and frequent. States that she now hurts every day, multiple times a day. Pain is diffuse about her abdomen. Nothing specific makes her pain worse. Dilaudid sometimes helps the pain but not always. She feels bloated and nauseated. She vomited most recently this morning. Colostomy is productive. She is on a regular diet but taking in very little.   Review of Systems  Constitutional: Positive for malaise/fatigue. Negative for fever.  Respiratory: Negative.   Cardiovascular: Negative.   Gastrointestinal: Positive for abdominal pain, constipation, diarrhea, nausea and vomiting.  Genitourinary: Negative.    All systems reviewed and otherwise negative except for as above  Family History  Problem Relation Age of Onset  . Breast cancer Maternal Aunt   . Throat cancer Cousin   . Cancer Cousin   . Throat cancer Cousin   . Lung cancer  Paternal Uncle     Past Medical History:  Diagnosis Date  . Adenocarcinoma ([place]) 02/2018  . Anxiety   . Cancer ([place]) 02/10/2018   Stage 4 Colorectal Cancer, spot on her lungs  . Migraine     Past Surgical History:  Procedure Laterality Date  . ADENOIDECTOMY    . CYSTOSCOPY W/ RETROGRADES Bilateral 09/01/2018   Procedure: CYSTOSCOPY WITH BILATERAL  RETROGRADE PYELOGRAM;  Surgeon: Alexis Frock, MD;  Location: [facility];  Service: Urology;  Laterality: Bilateral;  . CYSTOSCOPY WITH STENT PLACEMENT Bilateral 09/01/2018   Procedure: CYSTOSCOPY WITH URETERAL COOK CATHETER PLACEMENT;  Surgeon: Alexis Frock, MD;  Location: [facility];  Service: Urology;  Laterality: Bilateral;  . IR CV LINE INJECTION  06/09/2020  . ROBOTIC ASSISTED TOTAL HYSTERECTOMY WITH BILATERAL SALPINGO OOPHERECTOMY Bilateral 09/01/2018   Procedure: XI ROBOTIC ASSISTED PARTIAL VAGINECTOMY;  Surgeon: Everitt Amber, MD;  Location: [facility];  Service: Gynecology;  Laterality: Bilateral;  . TONSILLECTOMY      Social History:  reports that she quit smoking about 2 years ago. She quit after 30.00 years of use. She has never used smokeless tobacco. She reports previous alcohol use. She reports that she does not use drugs.  Allergies:  Allergies  Allergen Reactions  . Sulfa Antibiotics     Patient unaware of side effects, she was told when she was a child that she was allergic    Medications Prior to Admission  Medication Sig Dispense Refill  . acetaminophen (TYLENOL) 500 MG tablet Take 1,000 mg by mouth every 8 (eight) hours as needed (PAIN).     Marland Kitchen ALPRAZolam (XANAX) 0.5 MG tablet Take 1 tablet (0.5  mg total) by mouth 2 (two) times daily as needed for anxiety. (Patient taking differently: Take 1 mg by mouth at bedtime.) 60 tablet 5  . bisacodyl (DULCOLAX) 5 MG EC tablet Take 10 mg by mouth daily as needed for moderate constipation. Takes for few days after each tx    . dexamethasone (DECADRON) 4 MG tablet #2 tablets twice daily x  3 days. Start day after chemo treatment 30 tablet 0  . diphenoxylate-atropine (LOMOTIL) 2.5-0.025 MG tablet Take 2 tablets by mouth 4 (four) times daily as needed for diarrhea or loose stools. 30 tablet 1  . HYDROcodone-acetaminophen (NORCO) 5-325 MG tablet Take 1 tablet by mouth every 6 (six) hours as needed for moderate pain. 30 tablet 0  . lidocaine-prilocaine (EMLA) cream Apply 1 application topically as needed (port). 30 g 1  . loratadine (CLARITIN) 10 MG tablet Take 10 mg by mouth daily as needed for allergies.    . Magnesium Oxide 250 MG TABS Take 500 mg by mouth 2 (two) times daily.    Marland Kitchen omeprazole (PRILOSEC) 20 MG capsule Take 20 mg by mouth at bedtime.    . Polyethylene Glycol 3350 (MIRALAX PO) Take 17 g by mouth daily as needed (constipation).    . potassium chloride SA (KLOR-CON) 20 MEQ tablet Take 1 tablet (20 mEq total) by mouth 2 (two) times daily. 60 tablet 1  . prochlorperazine (COMPAZINE) 10 MG tablet Take 1 tablet (10 mg total) by mouth every 6 (six) hours as needed for nausea or vomiting. 60 tablet 1    Prior to Admission medications   Medication Sig Start Date End Date Taking? Authorizing Provider  acetaminophen (TYLENOL) 500 MG tablet Take 1,000 mg by mouth every 8 (eight) hours as needed (PAIN).    Yes [provider]  ALPRAZolam (XANAX) 0.5 MG tablet Take 1 tablet (0.5 mg total) by mouth 2 (two) times daily as needed for anxiety. Patient taking differently: Take 1 mg by mouth at bedtime. 08/14/20  Yes Ladell Pier, MD  bisacodyl (DULCOLAX) 5 MG EC tablet Take 10 mg by mouth daily as needed for moderate constipation. Takes for few days after each tx   Yes [provider]  dexamethasone (DECADRON) 4 MG tablet #2 tablets twice daily x 3 days. Start day after chemo treatment 11/05/20  Yes Ladell Pier, MD  diphenoxylate-atropine (LOMOTIL) 2.5-0.025 MG tablet Take 2 tablets by mouth 4 (four) times daily as needed for diarrhea or loose stools. 06/12/20  Yes  Ladell Pier, MD  HYDROcodone-acetaminophen (NORCO) 5-325 MG tablet Take 1 tablet by mouth every 6 (six) hours as needed for moderate pain. 11/24/20  Yes Owens Shark, NP  lidocaine-prilocaine (EMLA) cream Apply 1 application topically as needed (port). 06/30/20  Yes Ladell Pier, MD  loratadine (CLARITIN) 10 MG tablet Take 10 mg by mouth daily as needed for allergies.   Yes [provider]  Magnesium Oxide 250 MG TABS Take 500 mg by mouth 2 (two) times daily.   Yes [provider]  omeprazole (PRILOSEC) 20 MG capsule Take 20 mg by mouth at bedtime.   Yes [provider]  Polyethylene Glycol 3350 (MIRALAX PO) Take 17 g by mouth daily as needed (constipation).   Yes [provider]  potassium chloride SA (KLOR-CON) 20 MEQ tablet Take 1 tablet (20 mEq total) by mouth 2 (two) times daily. 11/05/20  Yes Ladell Pier, MD  prochlorperazine (COMPAZINE) 10 MG tablet Take 1 tablet (10 mg total) by  mouth every 6 (six) hours as needed for nausea or vomiting. 06/12/20  Yes Ladell Pier, MD    Blood pressure (!) 142/89, pulse (!) 123, temperature 99 F (37.2 C), temperature source Oral, resp. rate 16, weight 71.6 kg, SpO2 97 %. Physical Exam: General: pleasant, WD/WN female who is laying in bed in NAD HEENT: head is normocephalic, atraumatic.  Sclera are noninjected.  Pupils equal and round.  Ears and nose without any masses or lesions.  Mouth is pink and moist. Dentition fair Heart: tachycardic. No obvious murmurs, gallops, or rubs noted.  Palpable pedal pulses bilaterally  Lungs: CTAB, no wheezes, rhonchi, or rales noted.  Respiratory effort nonlabored Abd: soft, mild distension, diffuse tenderness with some voluntary guarding, no peritonitis, few tinkling bowel sounds, LLQ colostomy with small amount of liquid stool in bag MS: calves soft and nontender, mild BLE edema Skin: warm and dry with no masses, lesions, or rashes Psych: A&Ox4 with an appropriate  affect Neuro: cranial nerves grossly intact, equal strength in BUE/BLE bilaterally, normal speech, thought process intact  Results for orders placed or performed during the hospital encounter of 11/26/20 (from the past 48 hour(s))  Basic metabolic panel     Status: Abnormal   Collection Time: 12/02/20  5:04 AM  Result Value Ref Range   Sodium 131 (L) 135 - 145 mmol/L   Potassium 3.3 (L) 3.5 - 5.1 mmol/L   Chloride 96 (L) 98 - 111 mmol/L   CO2 26 22 - 32 mmol/L   Glucose, Bld 103 (H) 70 - 99 mg/dL    Comment: Glucose reference range applies only to samples taken after fasting for at least 8 hours.   BUN <5 (L) 6 - 20 mg/dL   Creatinine, Ser 0.45 0.44 - 1.00 mg/dL   Calcium 8.2 (L) 8.9 - 10.3 mg/dL   GFR, Estimated >60 >60 mL/min    Comment: (NOTE) Calculated using the CKD-EPI Creatinine Equation (2021) CORRECTED ON 05/24 AT 0624: PREVIOUSLY REPORTED AS >60    Anion gap 9 5 - 15    Comment: Performed at [facility], [place] [address], [place], [place] [address]  CBC with Differential/Platelet     Status: Abnormal   Collection Time: 12/02/20  5:04 AM  Result Value Ref Range   WBC 13.5 (H) 4.0 - 10.5 K/uL   RBC 4.56 3.87 - 5.11 MIL/uL   Hemoglobin 9.7 (L) 12.0 - 15.0 g/dL   HCT 31.7 (L) 36.0 - 46.0 %   MCV 69.5 (L) 80.0 - 100.0 fL   MCH 21.3 (L) 26.0 - 34.0 pg   MCHC 30.6 30.0 - 36.0 g/dL   RDW 20.0 (H) 11.5 - 15.5 %   Platelets 300 150 - 400 K/uL   nRBC 0.2 0.0 - 0.2 %   Neutrophils Relative % 76 %   Neutro Abs 10.2 (H) 1.7 - 7.7 K/uL   Lymphocytes Relative 7 %   Lymphs Abs 1.0 0.7 - 4.0 K/uL   Monocytes Relative 16 %   Monocytes Absolute 2.2 (H) 0.1 - 1.0 K/uL   Eosinophils Relative 0 %   Eosinophils Absolute 0.1 0.0 - 0.5 K/uL   Basophils Relative 0 %   Basophils Absolute 0.0 0.0 - 0.1 K/uL   Immature Granulocytes 1 %   Abs Immature Granulocytes 0.07 0.00 - 0.07 K/uL   Polychromasia PRESENT     Comment: Performed at [facility], [place] [address], [place], [place] [address]  CBC with Differential/Platelet  Status: Abnormal   Collection Time: 12/03/20  5:47 AM  Result Value Ref Range   WBC 13.5 (H) 4.0 - 10.5 K/uL   RBC 4.70 3.87 - 5.11 MIL/uL   Hemoglobin 10.2 (L) 12.0 - 15.0 g/dL   HCT 32.8 (L) 36.0 - 46.0 %   MCV 69.8 (L) 80.0 - 100.0 fL   MCH 21.7 (L) 26.0 - 34.0 pg   MCHC 31.1 30.0 - 36.0 g/dL   RDW 19.9 (H) 11.5 - 15.5 %   Platelets 287 150 - 400 K/uL   nRBC 0.2 0.0 - 0.2 %   Neutrophils Relative % 78 %   Neutro Abs 10.4 (H) 1.7 - 7.7 K/uL   Lymphocytes Relative 6 %   Lymphs Abs 0.9 0.7 - 4.0 K/uL   Monocytes Relative 15 %   Monocytes Absolute 2.0 (H) 0.1 - 1.0 K/uL   Eosinophils Relative 0 %   Eosinophils Absolute 0.0 0.0 - 0.5 K/uL   Basophils Relative 0 %   Basophils Absolute 0.0 0.0 - 0.1 K/uL   Immature Granulocytes 1 %   Abs Immature Granulocytes 0.09 (H) 0.00 - 0.07 K/uL    Comment: Performed at [facility], [place] [address] [place], [place] [address]  Basic metabolic panel     Status: Abnormal   Collection Time: 12/03/20  5:47 AM  Result Value Ref Range   Sodium 131 (L) 135 - 145 mmol/L   Potassium 3.8 3.5 - 5.1 mmol/L   Chloride 95 (L) 98 - 111 mmol/L   CO2 26 22 - 32 mmol/L   Glucose, Bld 105 (H) 70 - 99 mg/dL    Comment: Glucose reference range applies only to samples taken after fasting for at least 8 hours.   BUN 6 6 - 20 mg/dL   Creatinine, Ser <0.30 (L) 0.44 - 1.00 mg/dL   Calcium 8.5 (L) 8.9 - 10.3 mg/dL   GFR, Estimated NOT CALCULATED >60 mL/min    Comment: (NOTE) Calculated using the CKD-EPI Creatinine Equation (2021)    Anion gap 10 5 - 15    Comment: Performed at [facility], [place] [address] [place], [place] [address]  Magnesium     Status: None   Collection Time: 12/03/20  5:47 AM  Result Value Ref Range   Magnesium 2.3 1.7 - 2.4 mg/dL    Comment: Performed at [facility], [place] [address], [place], [place]  [address]  CBC     Status: Abnormal   Collection Time: 12/03/20 12:04 PM  Result Value Ref Range   WBC 13.3 (H) 4.0 - 10.5 K/uL   RBC 4.75 3.87 - 5.11 MIL/uL   Hemoglobin 10.4 (L) 12.0 - 15.0 g/dL   HCT 33.3 (L) 36.0 - 46.0 %   MCV 70.1 (L) 80.0 - 100.0 fL   MCH 21.9 (L) 26.0 - 34.0 pg   MCHC 31.2 30.0 - 36.0 g/dL   RDW 20.2 (H) 11.5 - 15.5 %   Platelets 247 150 - 400 K/uL   nRBC 0.3 (H) 0.0 - 0.2 %    Comment: Performed at [facility], [place] [address], [place], [place] [address]   No results found.  Anti-infectives (From admission, onward)   None        Assessment/Plan Anxiety GERD Anemia of chronic disease  Abdominal pain, nausea, vomiting Metastatic rectal cancer on chemotherapy S/p APR with end colostomy (Dr. Dema Severin) and radical vaginectomy (Dr. Denman George) 09/01/2018 - Patient reports worsening symptoms of abdominal  pain, nausea, and vomiting. She last had a CT scan on 11/18/20, and states that her symptoms are worse even since that time. Will repeat CT scan today.  ID - none VTE - lovenox FEN - regular diet Foley - none  Wellington Hampshire, [facility] Surgery 12/03/2020, 2:38 PM Please see Amion for pager number during day hours 7:00am-4:30pm

## 2020-12-03 NOTE — Progress Notes (Signed)
HEMATOLOGY-ONCOLOGY PROGRESS NOTE  SUBJECTIVE: Tina Morales continues to have abdominal pain and nausea.  She reports Dilaudid helps, but the pain returns.  She has not been able to urinate since yesterday morning.  She feels the urge to urinate.  Oncology History  Rectal cancer ([place])  04/27/2018 Initial Diagnosis   Rectal cancer ([place])   05/11/2018 -  Chemotherapy    Patient is on Treatment Plan: COLORECTAL FOLFOX + ZIRABEV Q 21 DAYS      06/13/2019 - 10/15/2020 Chemotherapy    Patient is on Treatment Plan: COLORECTAL FOLFOX + ZIRABEV Q 21 DAYS        PHYSICAL EXAMINATION:  Vitals:   12/02/20 2001 12/03/20 0517  BP: 115/88 120/81  Pulse: (!) 122 (!) 129  Resp: 16 16  Temp: 97.7 F (36.5 C) 98.4 F (36.9 C)  SpO2: 95% 91%   Filed Weights   12/01/20 0531 12/02/20 0531 12/03/20 0517  Weight: 160 lb 0.9 oz (72.6 kg) 151 lb 7.3 oz (68.7 kg) 157 lb 13.6 oz (71.6 kg)    Intake/Output from previous day: 05/24 0701 - 05/25 0700 In: 240 [P.O.:240] Out: 195 [Emesis/NG output:120; Stool:75]   OROPHARYNX:no exudate, no erythema and lips, buccal mucosa, and tongue normal  LUNGS: Scattered coarse rhonchi, mild expiratory wheezing HEART: Regular, tachycardia, no lower extremity edema ABDOMEN: Mildly distended, diffuse tenderness, left lower quadrant colostomy with small amount of liquid brown stool  NEURO: alert & oriented x 3 with fluent speech, no focal motor/sensory deficits  Port-A-Cath without erythema  LABORATORY DATA:  I have reviewed the data as listed CMP Latest Ref Rng & Units 12/03/2020 12/02/2020 11/28/2020  Glucose 70 - 99 mg/dL 105(H) 103(H) 92  BUN 6 - 20 mg/dL 6 <5(L) 7  Creatinine 0.44 - 1.00 mg/dL <0.30(L) 0.45 <0.30(L)  Sodium 135 - 145 mmol/L 131(L) 131(L) 135  Potassium 3.5 - 5.1 mmol/L 3.8 3.3(L) 4.3  Chloride 98 - 111 mmol/L 95(L) 96(L) 104  CO2 22 - 32 mmol/L 26 26 28   Calcium 8.9 - 10.3 mg/dL 8.5(L) 8.2(L) 8.1(L)  Total Protein 6.5 - 8.1 g/dL - - -   Total Bilirubin 0.3 - 1.2 mg/dL - - -  Alkaline Phos 38 - 126 U/L - - -  AST 15 - 41 U/L - - -  ALT 0 - 44 U/L - - -    Lab Results  Component Value Date   WBC 13.5 (H) 12/03/2020   HGB 10.2 (L) 12/03/2020   HCT 32.8 (L) 12/03/2020   MCV 69.8 (L) 12/03/2020   PLT 287 12/03/2020   NEUTROABS 10.4 (H) 12/03/2020    CT CHEST W CONTRAST  Result Date: 11/18/2020 CLINICAL DATA:  Abdominal pain for 1 week. History of colon cancer. On chemotherapy. Prior colostomy. EXAM: CT CHEST, ABDOMEN, AND PELVIS WITH CONTRAST TECHNIQUE: Multidetector CT imaging of the chest, abdomen and pelvis was performed following the standard protocol during bolus administration of intravenous contrast. CONTRAST:  71m OMNIPAQUE IOHEXOL 300 MG/ML  SOLN COMPARISON:  CT 09/01/2020 FINDINGS: CT CHEST FINDINGS Cardiovascular: Port in the anterior chest wall with tip in distal SVC. Coronary artery calcification and aortic atherosclerotic calcification. Mediastinum/Nodes: No axillary or supraclavicular adenopathy. No mediastinal or hilar adenopathy. No pericardial fluid. Esophagus normal. Lungs/Pleura: Bilateral pulmonary nodules again demonstrated. Nodules appear very similar to CT 09/01/2020. Example nodule in the superior segment of the RIGHT lower lobe measures 9 mm (image 55) compared to 10 mm on prior. Nodule in the RIGHT upper lobe measures 9 mm (image  57) compared with 9 mm. Nodule in the lateral aspect of the LEFT lower lobe measures 6 mm (image 85) compared with 5 mm. Peripheral LEFT upper lobe nodule measures 5 mm (image 67) compared with 6 mm. No new nodules are identified. Musculoskeletal: No aggressive osseous lesion. CT ABDOMEN AND PELVIS FINDINGS Hepatobiliary: No focal hepatic lesion. Small amount pericholecystic fluid is new from prior (image 16/series 2). No intrahepatic duct dilatation. Common bile duct normal caliber. Pancreas: Pancreas is normal. No ductal dilatation. No pancreatic inflammation. Spleen: Spleen  normal Adrenals/urinary tract: Adrenal glands and kidneys are normal. The ureters and bladder normal. Stomach/Bowel: Stomach and duodenum normal. There is mild submucosal edema within the loops of the small bowel deep within the pelvis (image 107/series 602). The small amount of fluid within the pelvis which is similar prior. There is fluid stool throughout the colon. The distal colon leading up to the ostomy demonstrates sub mucosal edema and mucosal enhancement (image 90/62 for example). There is mucosal enhancement through the colon exiting the ostomy. No evidence of local recurrence in the proctectomy site. There is no evidence of bowel obstruction. Vascular/Lymphatic: Abdominal aorta is normal caliber with atherosclerotic calcification. There is no retroperitoneal or periportal lymphadenopathy. No pelvic lymphadenopathy. Reproductive: Post hysterectomy.  Adnexa unremarkable Other: Along the posterior margin of the RIGHT hepatic lobe there is peritoneal thickening with potential enhancement. Small amount fluid at this site on comparison exam however the enhancement is new (image 63/62). Nodular enhancement measures 10 mm x 7 mm image 64/602). Nodularity also seen on coronal image 24/605) Musculoskeletal: No aggressive osseous lesion. IMPRESSION: Chest Impression: 1. Stable bilateral pulmonary nodular metastasis. 2. No mediastinal lymphadenopathy. Abdomen / Pelvis Impression: 1. Submucosal edema and mucosal enhancement involving the distal colon leading up to the colostomy as well as loops of small bowel in the pelvis. Findings are most consistent with colitis and enteritis. No high-grade obstruction. a 2. Concern for subtle peritoneal nodular enhancement along the posterior aspect the RIGHT hepatic lobe. Recommend close attention on follow-up with CT scan or FDG PET scan. These results will be called to the ordering clinician or representative by the Radiologist Assistant, and communication documented in the  PACS or [employer]. Electronically Signed   By: Suzy Bouchard M.D.   On: 11/18/2020 16:42   CT Abdomen Pelvis W Contrast  Result Date: 11/18/2020 CLINICAL DATA:  Abdominal pain for 1 week. History of colon cancer. On chemotherapy. Prior colostomy. EXAM: CT CHEST, ABDOMEN, AND PELVIS WITH CONTRAST TECHNIQUE: Multidetector CT imaging of the chest, abdomen and pelvis was performed following the standard protocol during bolus administration of intravenous contrast. CONTRAST:  19m OMNIPAQUE IOHEXOL 300 MG/ML  SOLN COMPARISON:  CT 09/01/2020 FINDINGS: CT CHEST FINDINGS Cardiovascular: Port in the anterior chest wall with tip in distal SVC. Coronary artery calcification and aortic atherosclerotic calcification. Mediastinum/Nodes: No axillary or supraclavicular adenopathy. No mediastinal or hilar adenopathy. No pericardial fluid. Esophagus normal. Lungs/Pleura: Bilateral pulmonary nodules again demonstrated. Nodules appear very similar to CT 09/01/2020. Example nodule in the superior segment of the RIGHT lower lobe measures 9 mm (image 55) compared to 10 mm on prior. Nodule in the RIGHT upper lobe measures 9 mm (image 57) compared with 9 mm. Nodule in the lateral aspect of the LEFT lower lobe measures 6 mm (image 85) compared with 5 mm. Peripheral LEFT upper lobe nodule measures 5 mm (image 67) compared with 6 mm. No new nodules are identified. Musculoskeletal: No aggressive osseous lesion. CT ABDOMEN AND PELVIS FINDINGS  Hepatobiliary: No focal hepatic lesion. Small amount pericholecystic fluid is new from prior (image 16/series 2). No intrahepatic duct dilatation. Common bile duct normal caliber. Pancreas: Pancreas is normal. No ductal dilatation. No pancreatic inflammation. Spleen: Spleen normal Adrenals/urinary tract: Adrenal glands and kidneys are normal. The ureters and bladder normal. Stomach/Bowel: Stomach and duodenum normal. There is mild submucosal edema within the loops of the small bowel deep  within the pelvis (image 107/series 602). The small amount of fluid within the pelvis which is similar prior. There is fluid stool throughout the colon. The distal colon leading up to the ostomy demonstrates sub mucosal edema and mucosal enhancement (image 90/62 for example). There is mucosal enhancement through the colon exiting the ostomy. No evidence of local recurrence in the proctectomy site. There is no evidence of bowel obstruction. Vascular/Lymphatic: Abdominal aorta is normal caliber with atherosclerotic calcification. There is no retroperitoneal or periportal lymphadenopathy. No pelvic lymphadenopathy. Reproductive: Post hysterectomy.  Adnexa unremarkable Other: Along the posterior margin of the RIGHT hepatic lobe there is peritoneal thickening with potential enhancement. Small amount fluid at this site on comparison exam however the enhancement is new (image 63/62). Nodular enhancement measures 10 mm x 7 mm image 64/602). Nodularity also seen on coronal image 24/605) Musculoskeletal: No aggressive osseous lesion. IMPRESSION: Chest Impression: 1. Stable bilateral pulmonary nodular metastasis. 2. No mediastinal lymphadenopathy. Abdomen / Pelvis Impression: 1. Submucosal edema and mucosal enhancement involving the distal colon leading up to the colostomy as well as loops of small bowel in the pelvis. Findings are most consistent with colitis and enteritis. No high-grade obstruction. a 2. Concern for subtle peritoneal nodular enhancement along the posterior aspect the RIGHT hepatic lobe. Recommend close attention on follow-up with CT scan or FDG PET scan. These results will be called to the ordering clinician or representative by the Radiologist Assistant, and communication documented in the PACS or [employer]. Electronically Signed   By: Suzy Bouchard M.D.   On: 11/18/2020 16:42   DG Chest Port 1 View  Result Date: 11/18/2020 CLINICAL DATA:  Abdominal pain and vomiting for 1 week. History of  colon cancer. EXAM: PORTABLE CHEST 1 VIEW COMPARISON:  CT chest, abdomen and pelvis 09/01/2020. FINDINGS: Right IJ approach Port-A-Cath is in place. The lungs are emphysematous. A few scattered pulmonary nodules consistent with known metastatic disease are seen. No consolidative process, pneumothorax or effusion. Heart size is normal. Aortic atherosclerosis. IMPRESSION: No acute disease. Pulmonary nodules consistent with known metastatic disease. Aortic Atherosclerosis (ICD10-I70.0) and Emphysema (ICD10-J43.9). Electronically Signed   By: Inge Rise M.D.   On: 11/18/2020 14:02   DG Abd 2 Views  Result Date: 11/28/2020 CLINICAL DATA:  Colon carcinoma.  Abdominal pain EXAM: ABDOMEN - 2 VIEW COMPARISON:  CT abdomen and pelvis Nov 18, 2020 FINDINGS: Supine and upright images were obtained. There is mild stool volume in the colon. No appreciable bowel dilatation or air-fluid levels to suggest bowel obstruction. No free air. There is mild left base atelectasis. Lung bases otherwise clear. Bibasilar nodular opacities consistent with metastatic foci. IMPRESSION: No evident bowel obstruction or free air. Mild left base atelectasis. Nodular opacities in the lung bases consistent with metastases, noted on recent CT as well. Electronically Signed   By: Lowella Grip III M.D.   On: 11/28/2020 11:13   [place] ASCITES (ABDOMEN LIMITED)  Result Date: 12/01/2020 CLINICAL DATA:  Colorectal cancer, abdominal distension, assess for therapeutic paracentesis EXAM: LIMITED ABDOMEN ULTRASOUND FOR ASCITES TECHNIQUE: Limited ultrasound survey for ascites was performed  in all four abdominal quadrants. COMPARISON:  11/18/2020 FINDINGS: Survey ultrasound performed of the abdominal 4 quadrants. Small amount of right upper quadrant perihepatic ascites. No large volume ascites appreciated that warrants therapeutic paracentesis. Procedure not performed. IMPRESSION: Small amount of right upper quadrant perihepatic ascites.  Electronically Signed   By: Jerilynn Mages.  Shick M.D.   On: 12/01/2020 12:05    ASSESSMENT AND PLAN: 1. Rectal cancer-rectal mass noted on digital exam 02/10/2018, colonoscopy confirmed a him my circumferential mass in the rectum ? 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 CTs 02/10/2018-anterior rectal wall thickening, pulmonary nodules measuring up to 9 mm concerning for metastases, few round perirectal lymph nodes ? Pelvic MRI 02/20/2018-hypermetabolic low rectal mass extending to the posterior vagina with 2 small enlarged perirectal lymph nodes, T4b,N1-2.3 cm from the anal verge ? PET scan 02/23/2018-hypermetabolic rectal mass, 8 mm hypermetabolic lingular nodule, scattered small bilateral lung nodules, some calcified, a few with mildly increased activity ? Cycle 1 FOLFOXIRI8/21/2019 ? Cycle 5 FOLFOXIRI10/16/2019 ? CTs 05/01/2018-significant interval response to therapy with decreased size of the primary rectal mass lesion. Decreasing perirectal lymphadenopathy. Decreased and/or resolved pulmonary nodules. No new sites of disease identified. ? Cycle 6 FOLFOXIRI10/31/2019 ? Radiation/Xeloda 06/12/2018-completed 07/21/2018 ? Xeloda dose reduced 07/10/2018 due to mucositis and diarrhea ? CTs 08/07/2018-compared to 02/10/2018-resolved and decreased pulmonary nodules, few tiny residual noncalcified nodules, decreased 20 perirectal lymph nodes, soft tissue of the rectum indistinguishable from posterior wall of vagina-Vaginal involvement by tumor? ? 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 CT chest 09/29/2018-enlargement of lung nodules, not a candidate for SBRT based on discussion in GI tumor conference and with radiation oncology ? CT chest 11/27/18 - interval growth of numerous pulmonary metastases bilaterally compared to 09/29/18, no new pulmonary mets ? Cycle 1FOLFOXIRI 11/29/18 ? Cycle 2 FOLFOXIRI6/10/2018 ? Cycle 3 FOLFOXIRI 12/28/2018 ? Cycle 4 FOLFIRINOX 01/11/2019 ? CTs 01/24/2019-significant improvement of bilateral pulmonary metastases. ? Cycle 1 FOLFIRI 02/01/2019 ? Cycle 2 FOLFIRI 02/22/2019 ? Cycle 3 FOLFIRI 03/15/2019 ? Cycle 4 FOLFIRI 04/05/2019 ? Cycle 5 FOLFIRI 04/26/2019 ? CTs 05/14/2019-slight enlargement of right lung nodules, no other evidence of disease progression ? Cycle 6 FOLFIRI 05/17/2019 ? Cycle 7 FOLFIRI plus Avastin 06/13/2019 ? Cycle 8 FOLFIRI plus Avastin 07/09/2019 ? Cycle 9 FOLFIRI plus Avastin 07/25/2019 ? Cycle 10 FOLFIRI plus Avastin 08/09/2019 ? Cycle 11 FOLFIRI plus Avastin 08/22/2019 ? CTs 09/07/2019-most pulmonary nodules are stable. 1 nodule right lower lobe slightly larger. No new lung lesions. ? Cycle 12 FOLFIRI plus Avastin 09/13/2019 ? Cycle 13 FOLFIRI plus Avastin 10/04/2019 ? Cycle 14 FOLFIRI plus Avastin 10/25/2019 ? Cycle 15 FOLFIRI plus Avastin 11/14/2019 ? Cycle 16 FOLFIRI plus Avastin 12/06/2019 ? CTs neck, chest, abdomen, pelvis 12/21/2019-neck negative; mildly progressive pulmonary metastases; abdomen and pelvis negative for evidence of metastatic disease. ? Cycle 1 FOLFOX/Avastin 12/27/2019 ? Cycle 2 FOLFOX/Avastin 01/17/2020 ? Cycle 3 FOLFOX/Avastin 02/07/2020 ? Cycle 4 FOLFOX 02/28/2020, Avastin held due to need for dental evaluation possible extractions ? Cycle 5 FOLFOX 03/20/2020, Avastin held pending dental evaluation ? CTs 04/04/2020-increased size of pulmonary nodules, no evidence of metastatic disease in the abdomen or pelvis ? Cycle 6 FOLFOX 05/22/2020, Avastin held secondary  to dental  extractions ? Cycle 7 FOLFOX/Avastin 06/12/2020 ? Cycle 8 FOLFOX/Avastin 06/30/2020 ? Cycle 9 FOLFOX/Avastin 07/24/2020 ? Cycle 10 FOLFOX/Avastin 08/14/2020 ? CTs 09/01/2020-improvement in bilateral pulmonary metastases. ? Cycle 11 FOLFOX/Avastin 09/04/2020 ? Cycle 12 FOLFOX/Avastin 09/25/2020 ? Cycle 13 FOLFOX/Avastin 10/15/2020 ? Cycle 14 FOLFOX/Avastin 11/05/2020 ? 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 Imodium as needed. 6.Mucositis secondary to Xeloda. Improved 07/19/2018. 7. 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 Admission 11/18/2020 with nausea/vomiting, fever, and abdominal pain-CT consistent with enteritis/colitis 12.  Admission 11/26/2020 with nausea, abdominal pain, loose stool, and dehydration 13.  Hospital admission 11/26/2020- nausea, abdominal pain, and diarrhea  Tina Morales appears unchanged.  She has persistent abdominal pain.  Dilaudid helps the pain, but does not completely relieve her pain.  She continues to have nausea and small-volume liquid stool output.  She has developed urinary retention over the past day.  The etiology of her symptoms remains unclear.  An abdominal ultrasound did not reveal significant ascites.  We increased the laxative regimen yesterday.  I will ask Dr. Dema Severin to see her.  Recommendations: 1.  Continue OxyContin and Dilaudid  2.  Continue MiraLAX, sorbitol, and Colace 3.  Surgical  consult with Dr. Dema Severin 4.  Bladder scan, I/O catheter if there is significant urine volume   LOS: 7 days   Betsy Coder, MD 12/03/20

## 2020-12-03 NOTE — Consult Note (Signed)
Consultation Note Date: 12/03/2020   Patient Name: Tina Morales  DOB: 1965/10/17  MRN: [id number]  Age / Sex: 55 y.o., female  PCP: Ladell Pier, MD Referring Physician: Domenic Polite, MD  Reason for Consultation: Pain control  HPI/Patient Profile: 55 y.o. female   admitted on 11/26/2020   Clinical Assessment and Goals of Care: 55 year old lady with metastatic rectal cancer recently on chemotherapy, history of colostomy and radical vaginectomy admitted with abdominal pain nausea vomiting fever CT scan 5-10 with submucosal edema mucosal enhancement, imaging also consistent with colitis and enteritis but no high-grade obstruction.  Palliative consultation for symptom management Patient has diffuse pain in her abdomen and has abdominal distention, has colostomy.  Also had mild nausea and vomiting.  Complains of feeling bloated.  States that IV Dilaudid helps.  Medication history noted.  Palliative medicine is specialized medical care for people living with serious illness. It focuses on providing relief from the symptoms and stress of a serious illness. The goal is to improve quality of life for both the patient and the family.    NEXT OF KIN Lives at home with her husband in [place], [place].  SUMMARY OF RECOMMENDATIONS   Change IV Dilaudid to be made available every 3 hours on an as-needed basis instead of every 4 hours, continue current pain and known pain symptom management regimen for now, agree with current bowel regimen.  Surgical consultation and possibly repeat CT scan abdomen is being considered at this time.  Code Status/Advance Care Planning:  Full code    Symptom Management:      Palliative Prophylaxis:   Delirium Protocol  Additional Recommendations (Limitations, Scope, Preferences):  Full Scope Treatment  Psycho-social/Spiritual:   Desire for further Chaplaincy  support:yes  Additional Recommendations: Caregiving  Support/Resources  Prognosis:   Unable to determine  Discharge Planning: To Be Determined      Primary Diagnoses: Present on Admission: . Diarrhea . Rectal cancer ([place]) . Anemia of chronic disease . Metastatic cancer to lung ([place]) . Colitis   I have reviewed the medical record, interviewed the patient and family, and examined the patient. The following aspects are pertinent.  Past Medical History:  Diagnosis Date  . Adenocarcinoma ([place]) 02/2018  . Anxiety   . Cancer ([place]) 02/10/2018   Stage 4 Colorectal Cancer, spot on her lungs  . Migraine    Social History   Socioeconomic History  . Marital status: Married    Spouse name: Not on file  . Number of children: Not on file  . Years of education: Not on file  . Highest education level: Not on file  Occupational History  . Not on file  Tobacco Use  . Smoking status: Former Smoker    Years: 30.00    Quit date: 02/2018    Years since quitting: 2.8  . Smokeless tobacco: Never Used  Vaping Use  . Vaping Use: Never used  Substance and Sexual Activity  . Alcohol use: Not Currently    Comment: per Dr Dema Severin chart, "no  drink in the last 5 years"  . Drug use: Never  . Sexual activity: Not on file  Other Topics Concern  . Not on file  Social History Narrative  . Not on file   Social Determinants of Health   Financial Resource Strain: Not on file  Food Insecurity: Not on file  Transportation Needs: Not on file  Physical Activity: Not on file  Stress: Not on file  Social Connections: Not on file   Family History  Problem Relation Age of Onset  . Breast cancer Maternal Aunt   . Throat cancer Cousin   . Cancer Cousin   . Throat cancer Cousin   . Lung cancer Paternal Uncle    Scheduled Meds: . alteplase  2 mg Intracatheter Once  . Chlorhexidine Gluconate Cloth  6 each Topical Daily  . dicyclomine  10 mg Oral TID AC  . docusate sodium  100 mg Oral BID  .  enoxaparin (LOVENOX) injection  40 mg Subcutaneous Q24H  . feeding supplement  1 Container Oral TID BM  . iohexol      . magnesium oxide  400 mg Oral BID  . oxyCODONE  10 mg Oral Q12H  . pantoprazole  40 mg Oral Daily  . polyethylene glycol  17 g Oral BID  . Ensure Max Protein  11 oz Oral BID  . saccharomyces boulardii  250 mg Oral BID  . sodium chloride flush  10-40 mL Intracatheter Q12H  . sorbitol  30 mL Oral BID   Continuous Infusions: PRN Meds:.acetaminophen, ALPRAZolam, HYDROmorphone (DILAUDID) injection, HYDROmorphone, loratadine, ondansetron (ZOFRAN) IV, prochlorperazine, sodium chloride flush Medications Prior to Admission:  Prior to Admission medications   Medication Sig Start Date End Date Taking? Authorizing Provider  acetaminophen (TYLENOL) 500 MG tablet Take 1,000 mg by mouth every 8 (eight) hours as needed (PAIN).    Yes [provider]  ALPRAZolam (XANAX) 0.5 MG tablet Take 1 tablet (0.5 mg total) by mouth 2 (two) times daily as needed for anxiety. Patient taking differently: Take 1 mg by mouth at bedtime. 08/14/20  Yes Ladell Pier, MD  bisacodyl (DULCOLAX) 5 MG EC tablet Take 10 mg by mouth daily as needed for moderate constipation. Takes for few days after each tx   Yes [provider]  dexamethasone (DECADRON) 4 MG tablet #2 tablets twice daily x 3 days. Start day after chemo treatment 11/05/20  Yes Ladell Pier, MD  diphenoxylate-atropine (LOMOTIL) 2.5-0.025 MG tablet Take 2 tablets by mouth 4 (four) times daily as needed for diarrhea or loose stools. 06/12/20  Yes Ladell Pier, MD  HYDROcodone-acetaminophen (NORCO) 5-325 MG tablet Take 1 tablet by mouth every 6 (six) hours as needed for moderate pain. 11/24/20  Yes Owens Shark, NP  lidocaine-prilocaine (EMLA) cream Apply 1 application topically as needed (port). 06/30/20  Yes Ladell Pier, MD  loratadine (CLARITIN) 10 MG tablet Take 10 mg by mouth daily as needed for allergies.   Yes  [provider]  Magnesium Oxide 250 MG TABS Take 500 mg by mouth 2 (two) times daily.   Yes [provider]  omeprazole (PRILOSEC) 20 MG capsule Take 20 mg by mouth at bedtime.   Yes [provider]  Polyethylene Glycol 3350 (MIRALAX PO) Take 17 g by mouth daily as needed (constipation).   Yes [provider]  potassium chloride SA (KLOR-CON) 20 MEQ tablet Take 1 tablet (20 mEq total) by mouth 2 (two) times daily. 11/05/20  Yes Betsy Coder  B, MD  prochlorperazine (COMPAZINE) 10 MG tablet Take 1 tablet (10 mg total) by mouth every 6 (six) hours as needed for nausea or vomiting. 06/12/20  Yes Ladell Pier, MD   Allergies  Allergen Reactions  . Sulfa Antibiotics     Patient unaware of side effects, she was told when she was a child that she was allergic   Review of Systems +abdominal pain.   Physical Exam Awake alert resting in bed Complains of abdominal discomfort Abdomen with mild to moderate distention diffuse tenderness, has left lower quadrant colostomy Skin is warm and dry Awake alert oriented no focal deficits Regular work of breathing S1-S2  Vital Signs: BP (!) 142/89 (BP Location: Left Arm)   Pulse (!) 123   Temp 99 F (37.2 C) (Oral)   Resp 16   Wt 71.6 kg   SpO2 97%   BMI 24.00 kg/m  Pain Scale: 0-10 POSS *See Group Information*: 1-Acceptable,Awake and alert Pain Score: 7    SpO2: SpO2: 97 % O2 Device:SpO2: 97 % O2 Flow Rate: .O2 Flow Rate (L/min): 2 L/min  IO: Intake/output summary:   Intake/Output Summary (Last 24 hours) at 12/03/2020 1515 Last data filed at 12/03/2020 1357 Gross per 24 hour  Intake 250 ml  Output 150 ml  Net 100 ml    LBM: Last BM Date: 12/02/20 Baseline Weight: Weight: 60.5 kg Most recent weight: Weight: 71.6 kg     Palliative Assessment/Data:   PPS 50%  Time In:  11 Time Out:  12 Time Total:   60  Greater than 50%  of this time was spent counseling and coordinating care related to the  above assessment and plan.  Signed by: Loistine Chance, MD   Please contact Palliative Medicine Team phone at [phone] for questions and concerns.  For individual provider: See Shea Evans

## 2020-12-04 DIAGNOSIS — Z515 Encounter for palliative care: Secondary | ICD-10-CM

## 2020-12-04 DIAGNOSIS — G893 Neoplasm related pain (acute) (chronic): Secondary | ICD-10-CM

## 2020-12-04 DIAGNOSIS — C20 Malignant neoplasm of rectum: Secondary | ICD-10-CM | POA: Diagnosis not present

## 2020-12-04 LAB — COMPREHENSIVE METABOLIC PANEL
ALT: 11 U/L (ref 0–44)
AST: 24 U/L (ref 15–41)
Albumin: 2.4 g/dL — ABNORMAL LOW (ref 3.5–5.0)
Alkaline Phosphatase: 179 U/L — ABNORMAL HIGH (ref 38–126)
Anion gap: 9 (ref 5–15)
BUN: 6 mg/dL (ref 6–20)
CO2: 30 mmol/L (ref 22–32)
Calcium: 8.2 mg/dL — ABNORMAL LOW (ref 8.9–10.3)
Chloride: 93 mmol/L — ABNORMAL LOW (ref 98–111)
Creatinine, Ser: <0.3 mg/dL — ABNORMAL LOW (ref 0.44–1.00)
Glucose, Bld: 99 mg/dL (ref 70–99)
Potassium: 3.3 mmol/L — ABNORMAL LOW (ref 3.5–5.1)
Sodium: 132 mmol/L — ABNORMAL LOW (ref 135–145)
Total Bilirubin: 1 mg/dL (ref 0.3–1.2)
Total Protein: 5 g/dL — ABNORMAL LOW (ref 6.5–8.1)

## 2020-12-04 LAB — CBC
HCT: 30.1 % — ABNORMAL LOW (ref 36.0–46.0)
Hemoglobin: 9.4 g/dL — ABNORMAL LOW (ref 12.0–15.0)
MCH: 21.7 pg — ABNORMAL LOW (ref 26.0–34.0)
MCHC: 31.2 g/dL (ref 30.0–36.0)
MCV: 69.5 fL — ABNORMAL LOW (ref 80.0–100.0)
Platelets: 285 10*3/uL (ref 150–400)
RBC: 4.33 MIL/uL (ref 3.87–5.11)
RDW: 19.9 % — ABNORMAL HIGH (ref 11.5–15.5)
WBC: 14.4 10*3/uL — ABNORMAL HIGH (ref 4.0–10.5)
nRBC: 0.1 % (ref 0.0–0.2)

## 2020-12-04 MED ORDER — SODIUM CHLORIDE 0.9 % IV SOLN: INTRAVENOUS | Status: DC

## 2020-12-04 MED ORDER — POTASSIUM CHLORIDE CRYS ER 20 MEQ PO TBCR
40.0000 meq | EXTENDED_RELEASE_TABLET | Freq: Once | ORAL | Status: AC
  Administered 2020-12-04: 40 meq via ORAL
  Filled 2020-12-04: qty 2

## 2020-12-04 NOTE — Progress Notes (Signed)
[facility] Progress Note     Subjective: CC-  Feels about the same. Patient reports persistent abdominal pain, distension, nausea, and vomiting. CT yesterday with mild small bowel dilatation as well as increased dilatation of the colon which extends up to the colostomy in the left lower quadrant, mild ascites, subtle peritoneal wall thickening posterior to inferior tip of right hepatic lobe concerning for possible carcinomatosis.  Objective: Vital signs in last 24 hours: Temp:  [98 F (36.7 C)-99 F (37.2 C)] 98 F (36.7 C) (05/26 0434) Pulse Rate:  [113-123] 113 (05/26 0434) Resp:  [16] 16 (05/26 0434) BP: (110-142)/(78-89) 125/84 (05/26 0434) SpO2:  [95 %-97 %] 96 % (05/26 0434) Weight:  [72.7 kg] 72.7 kg (05/26 0434) Last BM Date: 12/03/20  Intake/Output from previous day: 05/25 0701 - 05/26 0700 In: 370 [P.O.:360; I.V.:10] Out: 230 [Emesis/NG output:50; Stool:180] Intake/Output this shift: No intake/output data recorded.  PE: Gen:  Alert, NAD, pleasant Pulm: rate and effort normal Abd: soft, distended, mild diffuse TTP without peritonitis, LLQ ostomy viable with small amount of liquid stool in bag >> red rubber catheter gently inserted into stoma and 800cc liquid stool was evacuated Skin: no rashes noted, warm and dry  Lab Results:  Recent Labs    12/03/20 1204 12/04/20 0533  WBC 13.3* 14.4*  HGB 10.4* 9.4*  HCT 33.3* 30.1*  PLT 247 285   BMET Recent Labs    12/03/20 0547 12/04/20 0533  NA 131* 132*  K 3.8 3.3*  CL 95* 93*  CO2 26 30  GLUCOSE 105* 99  BUN 6 6  CREATININE <0.30* <0.30*  CALCIUM 8.5* 8.2*   PT/INR No results for input(s): LABPROT, INR in the last 72 hours. CMP     Component Value Date/Time   NA 132 (L) 12/04/2020 0533   K 3.3 (L) 12/04/2020 0533   CL 93 (L) 12/04/2020 0533   CO2 30 12/04/2020 0533   GLUCOSE 99 12/04/2020 0533   BUN 6 12/04/2020 0533   CREATININE <0.30 (L) 12/04/2020 0533   CREATININE 0.38 (L)  11/26/2020 0919   CALCIUM 8.2 (L) 12/04/2020 0533   PROT 5.0 (L) 12/04/2020 0533   ALBUMIN 2.4 (L) 12/04/2020 0533   AST 24 12/04/2020 0533   AST 33 11/26/2020 0919   ALT 11 12/04/2020 0533   ALT 10 11/26/2020 0919   ALKPHOS 179 (H) 12/04/2020 0533   BILITOT 1.0 12/04/2020 0533   BILITOT 1.0 11/26/2020 0919   GFRNONAA NOT CALCULATED 12/04/2020 0533   GFRNONAA >60 11/26/2020 0919   GFRAA >60 04/10/2020 0930   Lipase     Component Value Date/Time   LIPASE 34 11/18/2020 1346       Studies/Results: CT ABDOMEN PELVIS W CONTRAST  Result Date: 12/03/2020 CLINICAL DATA:  Abdominal distension. History of metastatic rectal cancer. EXAM: CT ABDOMEN AND PELVIS WITH CONTRAST TECHNIQUE: Multidetector CT imaging of the abdomen and pelvis was performed using the standard protocol following bolus administration of intravenous contrast. CONTRAST:  174mL OMNIPAQUE IOHEXOL 300 MG/ML  SOLN COMPARISON:  Nov 18, 2020. FINDINGS: Lower chest: Pulmonary nodules are noted in the visualized lung bases concerning for metastatic disease. Hepatobiliary: No focal liver abnormality is seen. No gallstones, gallbladder wall thickening, or biliary dilatation. Pancreas: Unremarkable. No pancreatic ductal dilatation or surrounding inflammatory changes. Spleen: Normal in size without focal abnormality. Adrenals/Urinary Tract: Adrenal glands are unremarkable. Kidneys are normal, without renal calculi, focal lesion, or hydronephrosis. Bladder is unremarkable. Stomach/Bowel: The stomach is unremarkable. Mildly dilated small  bowel loops are noted. Colostomy is again noted in the left lower quadrant. Increased dilatation of the colon is noted which extends to the colostomy, concerning for distal colonic obstruction. Potentially this may be related to inflammation of the colostomy itself. Vascular/Lymphatic: Aortic atherosclerosis. No enlarged abdominal or pelvic lymph nodes. Reproductive: Status post hysterectomy. No adnexal masses.  Other: Mild ascites is seen in the pelvis as well as around the liver and spleen. Subtle peritoneal wall thickening is seen posterior to inferior tip of right hepatic lobe; peritoneal implant or carcinomatosis cannot be excluded. Musculoskeletal: No acute or significant osseous findings. IMPRESSION: Pulmonary nodules are again noted in the visualized lung bases concerning for metastatic disease. Mild small bowel dilatation is noted, as well as increased dilatation of the colon which extends up to the colostomy in the left lower quadrant, concerning for distal colonic obstruction. Potentially this may related to inflammation of the colostomy itself. Mild ascites is noted in the pelvis as well as around the liver and spleen. Subtle peritoneal wall thickening is seen posterior to inferior tip of right hepatic lobe; peritoneal implant or carcinomatosis cannot be excluded. Electronically Signed   By: Marijo Conception M.D.   On: 12/03/2020 20:05    Anti-infectives: Anti-infectives (From admission, onward)   None       Assessment/Plan Anxiety GERD Anemia of chronic disease  Abdominal pain, nausea, vomiting Metastatic rectal cancer on chemotherapy - followed by Dr. Benay Spice, last dose Avastin end of April S/p APR with end colostomy (Dr. Dema Severin) and radicalvaginectomy (Dr. Denman George) 09/01/2018 - CT yesterday with mild small bowel dilatation as well as increased dilatation of the colon which extends up to the colostomy in the left lower quadrant, mild ascites, subtle peritoneal wall thickening posterior to inferior tip of right hepatic lobe concerning for possible carcinomatosis. - Symptoms likely related to narrowing around ostomy which is concerning for peritoneal carcinomatosis. Red rubber catheter inserted into stoma this morning with 800cc liquid stool evacuated, patient reported some immediate relief. Will place her on a full liquid diet and see how she does. Keep red rubber in place - will teach husband  how to do this. Would not advance past full liquids. If she tolerates diet and symptoms improve she may be able to go over the next few days with plans to follow up with Dr. Dema Severin and Dr. Benay Spice as outpatient to discuss next steps.   ID - none VTE - lovenox FEN - FLD Foley - none   LOS: 8 days    Wellington Hampshire, [facility] 12/04/2020, 9:45 AM Please see Amion for pager number during day hours 7:00am-4:30pm

## 2020-12-04 NOTE — Progress Notes (Signed)
PROGRESS NOTE    Tina Morales  TKW:[id number] DOB: 09/20/1965 DOA: 11/26/2020 PCP: Ladell Pier, MD    Brief Narrative:  55 year old female history of metastatic rectal CA, originally diagnosed in 2018, treated with multiple regimens of chemo and XRT -5/10 admitted with abdominal pain nausea, question of enteritis/colitis on CT, treated with supportive care antibiotics and discharge -Readmitted 5/18 from Dr. Gearldine Shown office with recurrent/persistent abdominal pain, nausea, diarrhea, poor p.o. intake. -Treated with supportive care, gastroenterology consulted as well who recommended supportive treatment only   Assessment & Plan:   Persistent abdominal pain, nausea -Seen by gastroenterology, recommended supportive care -CT yesterday noted mild small bowel dilation as well as increased dilation of the colon which extends up to the colostomy in the left lower quadrant, mild ascites and subtle peritoneal wall thickening posterior to the inferior tip of right hepatic lobe concerning for possible carcinomatosis  -Today a red rubber catheter was inserted into the stoma followed by evacuation of 800 mL of liquid stool with some relief of symptoms, appreciate general surgery assistance  -Liquid diet  -Continue laxatives -Plan for PET scan as outpatient  Hypokalemia/ hyponatremia/hypomagensemia. -Replaced  Metastatic rectal cancer. Sp abdominal perineal resection and vaginectomy on 2020.Positive pulmonary metastasis. -Oncology following -Last chemo was 3 weeks ago  GERDOn pantoprazole   Anxiety.  -Continue with alprazolam 1 mg tid as needed.   Anemia of chronic disease. -Stable  DVT prophylaxis: Enoxaparin  Code Status:    full  Family Communication:  Discussed with spouse yesterday  Status is: Inpatient  Remains inpatient appropriate because:Inpatient level of care appropriate due to severity of illness   Dispo: The patient is from: Home              Anticipated d/c  is to: Home              Patient currently is not medically stable to d/c.   Difficult to place patient No  Nutrition Status: Nutrition Problem: Increased nutrient needs Etiology: cancer and cancer related treatments Signs/Symptoms: estimated needs Interventions: Boost Breeze,Ensure Enlive (each supplement provides 350kcal and 20 grams of protein)    Consultants:   Oncology   GI     Subjective: -Still continues to have abdominal pain/discomfort but feels little better after decompression of ostomy this morning  Objective: Vitals:   12/03/20 0517 12/03/20 1240 12/03/20 2025 12/04/20 0434  BP: 120/81 (!) 142/89 110/78 125/84  Pulse: (!) 129 (!) 123 (!) 121 (!) 113  Resp: 16 16 16 16   Temp: 98.4 F (36.9 C) 99 F (37.2 C) 98 F (36.7 C) 98 F (36.7 C)  TempSrc: Oral Oral Oral Oral  SpO2: 91% 97% 95% 96%  Weight: 71.6 kg   72.7 kg    Intake/Output Summary (Last 24 hours) at 12/04/2020 1215 Last data filed at 12/04/2020 1155 Gross per 24 hour  Intake 250 ml  Output 1000 ml  Net -750 ml   Filed Weights   12/02/20 0531 12/03/20 0517 12/04/20 0434  Weight: 68.7 kg 71.6 kg 72.7 kg    Examination:   General: Chronically ill female, sitting up in bed, AAOx3, no distress HEENT: No JVD CVS: S1-S2, regular rate rhythm Lungs: Decreased breath sounds at the bases Abdomen: Soft, obese, mildly distended, colostomy with brown stool Extremities: No edema Skin: No rashes on exposed skin  Data Reviewed: I have personally reviewed following labs and imaging studies  CBC: Recent Labs  Lab 11/28/20 0534 12/02/20 0504 12/03/20 0547 12/03/20 1204 12/04/20 0533  WBC 9.7 13.5* 13.5* 13.3* 14.4*  NEUTROABS 7.2 10.2* 10.4*  --   --   HGB 9.9* 9.7* 10.2* 10.4* 9.4*  HCT 32.5* 31.7* 32.8* 33.3* 30.1*  MCV 70.0* 69.5* 69.8* 70.1* 69.5*  PLT 215 300 287 247 726   Basic Metabolic Panel: Recent Labs  Lab 11/28/20 0534 12/02/20 0504 12/03/20 0547 12/04/20 0533  NA 135 131*  131* 132*  K 4.3 3.3* 3.8 3.3*  CL 104 96* 95* 93*  CO2 28 26 26 30   GLUCOSE 92 103* 105* 99  BUN 7 <5* 6 6  CREATININE <0.30* 0.45 <0.30* <0.30*  CALCIUM 8.1* 8.2* 8.5* 8.2*  MG  --   --  2.3  --    GFR: CrCl cannot be calculated (This lab value cannot be used to calculate CrCl because it is not a number: <0.30). Liver Function Tests: Recent Labs  Lab 12/04/20 0533  AST 24  ALT 11  ALKPHOS 179*  BILITOT 1.0  PROT 5.0*  ALBUMIN 2.4*   No results for input(s): LIPASE, AMYLASE in the last 168 hours. No results for input(s): AMMONIA in the last 168 hours. Coagulation Profile: No results for input(s): INR, PROTIME in the last 168 hours. Cardiac Enzymes: No results for input(s): CKTOTAL, CKMB, CKMBINDEX, TROPONINI in the last 168 hours. BNP (last 3 results) No results for input(s): PROBNP in the last 8760 hours. HbA1C: No results for input(s): HGBA1C in the last 72 hours. CBG: No results for input(s): GLUCAP in the last 168 hours. Lipid Profile: No results for input(s): CHOL, HDL, LDLCALC, TRIG, CHOLHDL, LDLDIRECT in the last 72 hours. Thyroid Function Tests: No results for input(s): TSH, T4TOTAL, FREET4, T3FREE, THYROIDAB in the last 72 hours. Anemia Panel: No results for input(s): VITAMINB12, FOLATE, FERRITIN, TIBC, IRON, RETICCTPCT in the last 72 hours.  Scheduled Meds: . Chlorhexidine Gluconate Cloth  6 each Topical Daily  . dicyclomine  10 mg Oral TID AC  . docusate sodium  100 mg Oral BID  . enoxaparin (LOVENOX) injection  40 mg Subcutaneous Q24H  . feeding supplement  1 Container Oral TID BM  . magnesium oxide  400 mg Oral BID  . oxyCODONE  10 mg Oral Q12H  . pantoprazole  40 mg Oral Daily  . polyethylene glycol  17 g Oral BID  . Ensure Max Protein  11 oz Oral BID  . saccharomyces boulardii  250 mg Oral BID  . sodium chloride flush  10-40 mL Intracatheter Q12H  . sorbitol  30 mL Oral BID   Continuous Infusions: . sodium chloride 50 mL/hr at 12/04/20 1027      LOS: 8 days   Domenic Polite, MD

## 2020-12-04 NOTE — Progress Notes (Signed)
Nutrition Follow-up  INTERVENTION:   -Boost Breeze po TID, each supplement provides 250 kcal and 9 grams of protein  -Ensure MAX Protein po BID, each supplement provides 150 kcal and 30 grams of protein  NUTRITION DIAGNOSIS:   Increased nutrient needs related to cancer and cancer related treatments as evidenced by estimated needs.  Ongoing.  GOAL:   Patient will meet greater than or equal to 90% of their needs  Progressing.  MONITOR:   PO intake,Supplement acceptance,Labs,Weight trends,I & O's  ASSESSMENT:   55 y.o. female with medical history significant for rectal cancer, history of diarrhea and rectal pain secondary to #1, tobacco use disorder, iron deficiency anemia, migraines, essential hypertension, polyneuropathy, who was recently admitted on 11/18/2020 for the same.  At that time CT was consistent with enteritis/colitis.  Her clinical status improved with hydration and antibiotics in the hospital.  She completed an outpatient course of Augmentin on 11/25/2020.  She presented again today for the same complaints.  Due to worsening diarrhea,nausea and vomiting with concern for dehydration she was advised to come to the ED from oncology center for further evaluation.  Patient now on full liquid diet. Was not eating well while on regular diet. Was having some N/V and abdominal pain.  Per surgery note, had some output from colostomy following red rubber catheter placement.  Pt has been accepting protein shakes ordered.  Admission weight: 133 lbs. Current weight: 160 lbs.  I/Os: +693 ml  Medications: Colace, MAG-OX, Miralax, KLOR-CON, Florastor, IV Zofran  Labs reviewed:  Low Na, K  Diet Order:   Diet Order            Diet full liquid Room service appropriate? Yes; Fluid consistency: Thin  Diet effective now                 EDUCATION NEEDS:   No education needs have been identified at this time  Skin:  Skin Assessment: Reviewed RN Assessment  Last BM:   5/19  Height:   Ht Readings from Last 1 Encounters:  11/26/20 5\' 8"  (1.727 m)    Weight:   Wt Readings from Last 1 Encounters:  12/04/20 72.7 kg   BMI:  Body mass index is 24.37 kg/m.  Estimated Nutritional Needs:   Kcal:  1800-2000  Protein:  90-105g  Fluid:  2L/day  Clayton Bibles, MS, RD, LDN Inpatient Clinical Dietitian Contact information available via Amion

## 2020-12-04 NOTE — Progress Notes (Deleted)
RN entered patient's room to do bedside rounding and patient was just finishing up in restroom and getting back in bed. Patient said she was worried because the red catheter the doctor placed today in her stoma has come out. RN assessed colostomy and red 24 french "Bardex" catheter tip was laying out of stoma & inside of the bottom of patient's colostomy bag. Surgery was called and Dr. Johney Maine instructed RN to obtain a "24 French foley catheter" and to "place at the bedside for the morning". Will obtain this for the morning and continue to monitor patient for excess pain, nausea, vomiting, etc.

## 2020-12-04 NOTE — Progress Notes (Signed)
Palliative Medicine Progress Note  Per Dr. Rowe Pavy palliative consult note->HPI: 55 year old lady with metastatic rectal cancer recently on chemotherapy, history of colostomy and radical vaginectomy admitted with abdominal pain nausea vomiting fever CT scan 5-10 with submucosal edema mucosal enhancement, imaging also consistent with colitis and enteritis but no high-grade obstruction.  Palliative consultation for symptom management.  Subjective: Medical record reviewed. Assessed patient at the bedside. She is sitting up in bed with her husband Tina Morales visiting. She appears comfortable.   Followed up on pain control after yesterday's initial consult. Patient initially states she cannot remember what medication was started due to "chemo brain." She requests a list of her current medications so that she may be better equipped to answer questions. Provided with a written current list and reminded patient that she may request her medications as needed.   She reports feeling better today and she attributes this to bedside intervention (red rubber decompression) rather than pain medicine. She also reports a large amount of stool evacuated through her ostomy which was immediately relieving. She describes her pain as a 7-8/10 at its worst and 5-6/10 at its best. Pain levels depends on the day and her bowel/urinary habits.  Patient shares that many of her cancer survivor group members have been recommending palliative care for symptom management. She is glad for this visit as she has previously associated palliative care with frightening thoughts of end of life care. Questions and concerns addressed. Provided with PMT contact information and encouraged to call with any additional concerns or needs.  Exam: Alert and oriented x3. Ill-appearing. Normal respiratory effort. Normal heart rate. Moves all extremities spontaneously. Abdomen is distended. Mood and behavior are appropriate.  Assessment: Cancer-related  pain  Plan: -Continue with IV Dilaudid PRN, no changes to medications today given her relief with red rubber catheter. Will follow up again tomorrow -Psychosocial and emotional support provided -PMT will continue to support holistically  Total time: 20 minutes Greater than 50% of this time was spent in counseling and coordinating care related to the above assessment and plan.  Tina Cooler, Tina Morales Palliative Medicine Team Team phone # [phone]  Thank you for allowing the Palliative Medicine Team to assist in the care of this patient. Please utilize secure chat with additional questions, if there is no response within 30 minutes please call the above phone number.  Palliative Medicine Team providers are available by phone from 7am to 7pm daily and can be reached through the team cell phone.  Should this patient require assistance outside of these hours, please call the patient's attending physician.

## 2020-12-04 NOTE — Progress Notes (Addendum)
HEMATOLOGY-ONCOLOGY PROGRESS NOTE  SUBJECTIVE: Tina Morales continues to have abdominal pain and distention.  No vomiting reported this morning.  She also reports that she is not voiding as much as she used to.  Discussed with nursing who reported that bladder scan did not show a significant amount of urine.  Oncology History  Rectal cancer ([place])  04/27/2018 Initial Diagnosis   Rectal cancer ([place])   05/11/2018 -  Chemotherapy    Patient is on Treatment Plan: COLORECTAL FOLFOX + ZIRABEV Q 21 DAYS      06/13/2019 - 10/15/2020 Chemotherapy    Patient is on Treatment Plan: COLORECTAL FOLFOX + ZIRABEV Q 21 DAYS        PHYSICAL EXAMINATION:  Vitals:   12/03/20 2025 12/04/20 0434  BP: 110/78 125/84  Pulse: (!) 121 (!) 113  Resp: 16 16  Temp: 98 F (36.7 C) 98 F (36.7 C)  SpO2: 95% 96%   Filed Weights   12/02/20 0531 12/03/20 0517 12/04/20 0434  Weight: 68.7 kg 71.6 kg 72.7 kg    Intake/Output from previous day: 05/25 0701 - 05/26 0700 In: 370 [P.O.:360; I.V.:10] Out: 230 [Emesis/NG output:50; Stool:180]   OROPHARYNX:no exudate, no erythema and lips, buccal mucosa, and tongue normal  LUNGS: Clear this morning HEART: Regular, tachycardia, no lower extremity edema ABDOMEN: Mildly distended, diffuse tenderness, left lower quadrant colostomy with small amount of liquid brown stool  NEURO: alert & oriented x 3 with fluent speech, no focal motor/sensory deficits  Port-A-Cath without erythema  LABORATORY DATA:  I have reviewed the data as listed CMP Latest Ref Rng & Units 12/04/2020 12/03/2020 12/02/2020  Glucose 70 - 99 mg/dL 99 105(H) 103(H)  BUN 6 - 20 mg/dL 6 6 <5(L)  Creatinine 0.44 - 1.00 mg/dL <0.30(L) <0.30(L) 0.45  Sodium 135 - 145 mmol/L 132(L) 131(L) 131(L)  Potassium 3.5 - 5.1 mmol/L 3.3(L) 3.8 3.3(L)  Chloride 98 - 111 mmol/L 93(L) 95(L) 96(L)  CO2 22 - 32 mmol/L 30 26 26   Calcium 8.9 - 10.3 mg/dL 8.2(L) 8.5(L) 8.2(L)  Total Protein 6.5 - 8.1 g/dL 5.0(L) - -   Total Bilirubin 0.3 - 1.2 mg/dL 1.0 - -  Alkaline Phos 38 - 126 U/L 179(H) - -  AST 15 - 41 U/L 24 - -  ALT 0 - 44 U/L 11 - -    Lab Results  Component Value Date   WBC 14.4 (H) 12/04/2020   HGB 9.4 (L) 12/04/2020   HCT 30.1 (L) 12/04/2020   MCV 69.5 (L) 12/04/2020   PLT 285 12/04/2020   NEUTROABS 10.4 (H) 12/03/2020    CT CHEST W CONTRAST  Result Date: 11/18/2020 CLINICAL DATA:  Abdominal pain for 1 week. History of colon cancer. On chemotherapy. Prior colostomy. EXAM: CT CHEST, ABDOMEN, AND PELVIS WITH CONTRAST TECHNIQUE: Multidetector CT imaging of the chest, abdomen and pelvis was performed following the standard protocol during bolus administration of intravenous contrast. CONTRAST:  39m OMNIPAQUE IOHEXOL 300 MG/ML  SOLN COMPARISON:  CT 09/01/2020 FINDINGS: CT CHEST FINDINGS Cardiovascular: Port in the anterior chest wall with tip in distal SVC. Coronary artery calcification and aortic atherosclerotic calcification. Mediastinum/Nodes: No axillary or supraclavicular adenopathy. No mediastinal or hilar adenopathy. No pericardial fluid. Esophagus normal. Lungs/Pleura: Bilateral pulmonary nodules again demonstrated. Nodules appear very similar to CT 09/01/2020. Example nodule in the superior segment of the RIGHT lower lobe measures 9 mm (image 55) compared to 10 mm on prior. Nodule in the RIGHT upper lobe measures 9 mm (image 57) compared with  9 mm. Nodule in the lateral aspect of the LEFT lower lobe measures 6 mm (image 85) compared with 5 mm. Peripheral LEFT upper lobe nodule measures 5 mm (image 67) compared with 6 mm. No new nodules are identified. Musculoskeletal: No aggressive osseous lesion. CT ABDOMEN AND PELVIS FINDINGS Hepatobiliary: No focal hepatic lesion. Small amount pericholecystic fluid is new from prior (image 16/series 2). No intrahepatic duct dilatation. Common bile duct normal caliber. Pancreas: Pancreas is normal. No ductal dilatation. No pancreatic inflammation. Spleen:  Spleen normal Adrenals/urinary tract: Adrenal glands and kidneys are normal. The ureters and bladder normal. Stomach/Bowel: Stomach and duodenum normal. There is mild submucosal edema within the loops of the small bowel deep within the pelvis (image 107/series 602). The small amount of fluid within the pelvis which is similar prior. There is fluid stool throughout the colon. The distal colon leading up to the ostomy demonstrates sub mucosal edema and mucosal enhancement (image 90/62 for example). There is mucosal enhancement through the colon exiting the ostomy. No evidence of local recurrence in the proctectomy site. There is no evidence of bowel obstruction. Vascular/Lymphatic: Abdominal aorta is normal caliber with atherosclerotic calcification. There is no retroperitoneal or periportal lymphadenopathy. No pelvic lymphadenopathy. Reproductive: Post hysterectomy.  Adnexa unremarkable Other: Along the posterior margin of the RIGHT hepatic lobe there is peritoneal thickening with potential enhancement. Small amount fluid at this site on comparison exam however the enhancement is new (image 63/62). Nodular enhancement measures 10 mm x 7 mm image 64/602). Nodularity also seen on coronal image 24/605) Musculoskeletal: No aggressive osseous lesion. IMPRESSION: Chest Impression: 1. Stable bilateral pulmonary nodular metastasis. 2. No mediastinal lymphadenopathy. Abdomen / Pelvis Impression: 1. Submucosal edema and mucosal enhancement involving the distal colon leading up to the colostomy as well as loops of small bowel in the pelvis. Findings are most consistent with colitis and enteritis. No high-grade obstruction. a 2. Concern for subtle peritoneal nodular enhancement along the posterior aspect the RIGHT hepatic lobe. Recommend close attention on follow-up with CT scan or FDG PET scan. These results will be called to the ordering clinician or representative by the Radiologist Assistant, and communication documented in  the PACS or [employer]. Electronically Signed   By: Suzy Bouchard M.D.   On: 11/18/2020 16:42   CT ABDOMEN PELVIS W CONTRAST  Result Date: 12/03/2020 CLINICAL DATA:  Abdominal distension. History of metastatic rectal cancer. EXAM: CT ABDOMEN AND PELVIS WITH CONTRAST TECHNIQUE: Multidetector CT imaging of the abdomen and pelvis was performed using the standard protocol following bolus administration of intravenous contrast. CONTRAST:  151m OMNIPAQUE IOHEXOL 300 MG/ML  SOLN COMPARISON:  Nov 18, 2020. FINDINGS: Lower chest: Pulmonary nodules are noted in the visualized lung bases concerning for metastatic disease. Hepatobiliary: No focal liver abnormality is seen. No gallstones, gallbladder wall thickening, or biliary dilatation. Pancreas: Unremarkable. No pancreatic ductal dilatation or surrounding inflammatory changes. Spleen: Normal in size without focal abnormality. Adrenals/Urinary Tract: Adrenal glands are unremarkable. Kidneys are normal, without renal calculi, focal lesion, or hydronephrosis. Bladder is unremarkable. Stomach/Bowel: The stomach is unremarkable. Mildly dilated small bowel loops are noted. Colostomy is again noted in the left lower quadrant. Increased dilatation of the colon is noted which extends to the colostomy, concerning for distal colonic obstruction. Potentially this may be related to inflammation of the colostomy itself. Vascular/Lymphatic: Aortic atherosclerosis. No enlarged abdominal or pelvic lymph nodes. Reproductive: Status post hysterectomy. No adnexal masses. Other: Mild ascites is seen in the pelvis as well as around the liver  and spleen. Subtle peritoneal wall thickening is seen posterior to inferior tip of right hepatic lobe; peritoneal implant or carcinomatosis cannot be excluded. Musculoskeletal: No acute or significant osseous findings. IMPRESSION: Pulmonary nodules are again noted in the visualized lung bases concerning for metastatic disease. Mild small bowel  dilatation is noted, as well as increased dilatation of the colon which extends up to the colostomy in the left lower quadrant, concerning for distal colonic obstruction. Potentially this may related to inflammation of the colostomy itself. Mild ascites is noted in the pelvis as well as around the liver and spleen. Subtle peritoneal wall thickening is seen posterior to inferior tip of right hepatic lobe; peritoneal implant or carcinomatosis cannot be excluded. Electronically Signed   By: Marijo Conception M.D.   On: 12/03/2020 20:05   CT Abdomen Pelvis W Contrast  Result Date: 11/18/2020 CLINICAL DATA:  Abdominal pain for 1 week. History of colon cancer. On chemotherapy. Prior colostomy. EXAM: CT CHEST, ABDOMEN, AND PELVIS WITH CONTRAST TECHNIQUE: Multidetector CT imaging of the chest, abdomen and pelvis was performed following the standard protocol during bolus administration of intravenous contrast. CONTRAST:  70m OMNIPAQUE IOHEXOL 300 MG/ML  SOLN COMPARISON:  CT 09/01/2020 FINDINGS: CT CHEST FINDINGS Cardiovascular: Port in the anterior chest wall with tip in distal SVC. Coronary artery calcification and aortic atherosclerotic calcification. Mediastinum/Nodes: No axillary or supraclavicular adenopathy. No mediastinal or hilar adenopathy. No pericardial fluid. Esophagus normal. Lungs/Pleura: Bilateral pulmonary nodules again demonstrated. Nodules appear very similar to CT 09/01/2020. Example nodule in the superior segment of the RIGHT lower lobe measures 9 mm (image 55) compared to 10 mm on prior. Nodule in the RIGHT upper lobe measures 9 mm (image 57) compared with 9 mm. Nodule in the lateral aspect of the LEFT lower lobe measures 6 mm (image 85) compared with 5 mm. Peripheral LEFT upper lobe nodule measures 5 mm (image 67) compared with 6 mm. No new nodules are identified. Musculoskeletal: No aggressive osseous lesion. CT ABDOMEN AND PELVIS FINDINGS Hepatobiliary: No focal hepatic lesion. Small amount  pericholecystic fluid is new from prior (image 16/series 2). No intrahepatic duct dilatation. Common bile duct normal caliber. Pancreas: Pancreas is normal. No ductal dilatation. No pancreatic inflammation. Spleen: Spleen normal Adrenals/urinary tract: Adrenal glands and kidneys are normal. The ureters and bladder normal. Stomach/Bowel: Stomach and duodenum normal. There is mild submucosal edema within the loops of the small bowel deep within the pelvis (image 107/series 602). The small amount of fluid within the pelvis which is similar prior. There is fluid stool throughout the colon. The distal colon leading up to the ostomy demonstrates sub mucosal edema and mucosal enhancement (image 90/62 for example). There is mucosal enhancement through the colon exiting the ostomy. No evidence of local recurrence in the proctectomy site. There is no evidence of bowel obstruction. Vascular/Lymphatic: Abdominal aorta is normal caliber with atherosclerotic calcification. There is no retroperitoneal or periportal lymphadenopathy. No pelvic lymphadenopathy. Reproductive: Post hysterectomy.  Adnexa unremarkable Other: Along the posterior margin of the RIGHT hepatic lobe there is peritoneal thickening with potential enhancement. Small amount fluid at this site on comparison exam however the enhancement is new (image 63/62). Nodular enhancement measures 10 mm x 7 mm image 64/602). Nodularity also seen on coronal image 24/605) Musculoskeletal: No aggressive osseous lesion. IMPRESSION: Chest Impression: 1. Stable bilateral pulmonary nodular metastasis. 2. No mediastinal lymphadenopathy. Abdomen / Pelvis Impression: 1. Submucosal edema and mucosal enhancement involving the distal colon leading up to the colostomy as well as loops  of small bowel in the pelvis. Findings are most consistent with colitis and enteritis. No high-grade obstruction. a 2. Concern for subtle peritoneal nodular enhancement along the posterior aspect the RIGHT  hepatic lobe. Recommend close attention on follow-up with CT scan or FDG PET scan. These results will be called to the ordering clinician or representative by the Radiologist Assistant, and communication documented in the PACS or [employer]. Electronically Signed   By: Suzy Bouchard M.D.   On: 11/18/2020 16:42   DG Chest Port 1 View  Result Date: 11/18/2020 CLINICAL DATA:  Abdominal pain and vomiting for 1 week. History of colon cancer. EXAM: PORTABLE CHEST 1 VIEW COMPARISON:  CT chest, abdomen and pelvis 09/01/2020. FINDINGS: Right IJ approach Port-A-Cath is in place. The lungs are emphysematous. A few scattered pulmonary nodules consistent with known metastatic disease are seen. No consolidative process, pneumothorax or effusion. Heart size is normal. Aortic atherosclerosis. IMPRESSION: No acute disease. Pulmonary nodules consistent with known metastatic disease. Aortic Atherosclerosis (ICD10-I70.0) and Emphysema (ICD10-J43.9). Electronically Signed   By: Inge Rise M.D.   On: 11/18/2020 14:02   DG Abd 2 Views  Result Date: 11/28/2020 CLINICAL DATA:  Colon carcinoma.  Abdominal pain EXAM: ABDOMEN - 2 VIEW COMPARISON:  CT abdomen and pelvis Nov 18, 2020 FINDINGS: Supine and upright images were obtained. There is mild stool volume in the colon. No appreciable bowel dilatation or air-fluid levels to suggest bowel obstruction. No free air. There is mild left base atelectasis. Lung bases otherwise clear. Bibasilar nodular opacities consistent with metastatic foci. IMPRESSION: No evident bowel obstruction or free air. Mild left base atelectasis. Nodular opacities in the lung bases consistent with metastases, noted on recent CT as well. Electronically Signed   By: Lowella Grip III M.D.   On: 11/28/2020 11:13   [place] ASCITES (ABDOMEN LIMITED)  Result Date: 12/01/2020 CLINICAL DATA:  Colorectal cancer, abdominal distension, assess for therapeutic paracentesis EXAM: LIMITED ABDOMEN ULTRASOUND  FOR ASCITES TECHNIQUE: Limited ultrasound survey for ascites was performed in all four abdominal quadrants. COMPARISON:  11/18/2020 FINDINGS: Survey ultrasound performed of the abdominal 4 quadrants. Small amount of right upper quadrant perihepatic ascites. No large volume ascites appreciated that warrants therapeutic paracentesis. Procedure not performed. IMPRESSION: Small amount of right upper quadrant perihepatic ascites. Electronically Signed   By: Jerilynn Mages.  Shick M.D.   On: 12/01/2020 12:05    ASSESSMENT AND PLAN: 1. Rectal cancer-rectal mass noted on digital exam 02/10/2018, colonoscopy confirmed a him my circumferential mass in the rectum ? 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 CTs 02/10/2018-anterior rectal wall thickening, pulmonary nodules measuring up to 9 mm concerning for metastases, few round perirectal lymph nodes ? Pelvic MRI 02/20/2018-hypermetabolic low rectal mass extending to the posterior vagina with 2 small enlarged perirectal lymph nodes, T4b,N1-2.3 cm from the anal verge ? PET scan 02/23/2018-hypermetabolic rectal mass, 8 mm hypermetabolic lingular nodule, scattered small bilateral lung nodules, some calcified, a few with mildly increased activity ? Cycle 1 FOLFOXIRI8/21/2019 ? Cycle 5 FOLFOXIRI10/16/2019 ? CTs 05/01/2018-significant interval response to therapy with decreased size of the primary rectal mass lesion. Decreasing perirectal lymphadenopathy. Decreased and/or resolved pulmonary nodules. No new sites of disease identified. ? Cycle 6 FOLFOXIRI10/31/2019 ? Radiation/Xeloda 06/12/2018-completed 07/21/2018 ? Xeloda dose reduced 07/10/2018 due to mucositis and diarrhea ? CTs 08/07/2018-compared to 02/10/2018-resolved and decreased pulmonary  nodules, few tiny residual noncalcified nodules, decreased 20 perirectal lymph nodes, soft tissue of the rectum indistinguishable from posterior wall of vagina-Vaginal involvement by tumor? ? 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 CT chest 09/29/2018-enlargement of lung nodules, not a candidate for SBRT based on discussion in GI tumor conference and with radiation oncology ? CT chest 11/27/18 - interval growth of numerous pulmonary metastases bilaterally compared to 09/29/18, no new pulmonary mets ? Cycle 1FOLFOXIRI 11/29/18 ? Cycle 2 FOLFOXIRI6/10/2018 ? Cycle 3 FOLFOXIRI 12/28/2018 ? Cycle 4 FOLFIRINOX 01/11/2019 ? CTs 01/24/2019-significant improvement of bilateral pulmonary metastases. ? Cycle 1 FOLFIRI 02/01/2019 ? Cycle 2 FOLFIRI 02/22/2019 ? Cycle 3 FOLFIRI 03/15/2019 ? Cycle 4 FOLFIRI 04/05/2019 ? Cycle 5 FOLFIRI 04/26/2019 ? CTs 05/14/2019-slight enlargement of right lung nodules, no other evidence of disease progression ? Cycle 6 FOLFIRI 05/17/2019 ? Cycle 7 FOLFIRI plus Avastin 06/13/2019 ? Cycle 8 FOLFIRI plus Avastin 07/09/2019 ? Cycle 9 FOLFIRI plus Avastin 07/25/2019 ? Cycle 10 FOLFIRI plus Avastin 08/09/2019 ? Cycle 11 FOLFIRI plus Avastin 08/22/2019 ? CTs 09/07/2019-most pulmonary nodules are stable. 1 nodule right lower lobe slightly larger. No new lung lesions. ? Cycle 12 FOLFIRI plus Avastin 09/13/2019 ? Cycle 13 FOLFIRI plus Avastin 10/04/2019 ? Cycle 14 FOLFIRI plus Avastin 10/25/2019 ? Cycle 15 FOLFIRI plus Avastin 11/14/2019 ? Cycle 16 FOLFIRI plus Avastin 12/06/2019 ? CTs neck, chest, abdomen, pelvis 12/21/2019-neck negative; mildly progressive pulmonary metastases; abdomen and pelvis negative for evidence of metastatic disease. ? Cycle 1  FOLFOX/Avastin 12/27/2019 ? Cycle 2 FOLFOX/Avastin 01/17/2020 ? Cycle 3 FOLFOX/Avastin 02/07/2020 ? Cycle 4 FOLFOX 02/28/2020, Avastin held due to need for dental evaluation possible extractions ? Cycle 5 FOLFOX 03/20/2020, Avastin held pending dental evaluation ? CTs 04/04/2020-increased size of pulmonary nodules, no evidence of metastatic disease in the abdomen or pelvis ? Cycle 6 FOLFOX 05/22/2020, Avastin held secondary to dental extractions ? Cycle 7 FOLFOX/Avastin 06/12/2020 ? Cycle 8 FOLFOX/Avastin 06/30/2020 ? Cycle 9 FOLFOX/Avastin 07/24/2020 ? Cycle 10 FOLFOX/Avastin 08/14/2020 ? CTs 09/01/2020-improvement in bilateral pulmonary metastases. ? Cycle 11 FOLFOX/Avastin 09/04/2020 ? Cycle 12 FOLFOX/Avastin 09/25/2020 ? Cycle 13 FOLFOX/Avastin 10/15/2020 ? Cycle 14 FOLFOX/Avastin 11/05/2020 ? CTs 11/18/2020-stable lung nodules, no focal hepatic lesion, small amount of new pericholecystic fluid, mild submucosal edema and small bowel loops within the pelvis, fluid stool throughout the colon with submucosal edema and enhancement in the distal colon, new nodular enhancement at the posterior margin of the right liver ? CT 12/03/2020- pulmonary nodules again noted in the lung bases, mild small bowel dilatation and increased dilatation of the colon which extends up to the colostomy in the left lower quadrant concerning for distal colonic obstruction, mild ascites subtle peritoneal wall thickening seen in the posterior to inferior tip of the right hepatic lobe, peritoneal implant or carcinomatosis cannot be excluded. 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 Imodium as needed. 6.Mucositis secondary to Xeloda. Improved 07/19/2018. 7. 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 Admission 11/18/2020 with nausea/vomiting, fever, and abdominal pain-CT consistent with enteritis/colitis 12.  Admission 11/26/2020 with nausea, abdominal pain, loose stool, and dehydration 13.  Hospital admission 11/26/2020- nausea, abdominal pain, and diarrhea  Tina Morales continues to have persistent abdominal pain.  Dilaudid helps the pain, but does not completely relieve her pain.  She continues to have nausea and small-volume liquid stool output.  She continues to report urge to void, but no significant urine noted on bladder scan.  CT showed findings concerning for bowel obstruction.  General surgery is following.  Will defer management of bowel obstruction to general surgery.  Recommendations: 1.  Continue OxyContin and Dilaudid  2.  Continue MiraLAX, sorbitol, and Colace 3.  Management of bowel obstruction per general surgery 4.  Bladder scan, I/O catheter if there is significant urine volume   LOS: 8 days   Mikey Bussing, MD 12/04/20 Ms. Texeira was interviewed and examined.  I reviewed the 12/03/2020 CT images.  The CT is consistent with colonic obstruction.  The etiology of the obstruction remains unclear with the differential diagnosis including carcinomatosis and adhesions.  We appreciate the help from Dr. Dema Severin.  I will check on her tomorrow and outpatient follow-up will be scheduled at the Cancer center.  We can wean the narcotic analgesics as tolerated if she has significant relief after placement of the red rubber catheter.  I was present for greater than 50% of today's visit.  I performed medical decision making.

## 2020-12-05 ENCOUNTER — Other Ambulatory Visit: Payer: Self-pay | Admitting: *Deleted

## 2020-12-05 DIAGNOSIS — C20 Malignant neoplasm of rectum: Secondary | ICD-10-CM | POA: Diagnosis not present

## 2020-12-05 LAB — BASIC METABOLIC PANEL
Anion gap: 6 (ref 5–15)
BUN: 5 mg/dL — ABNORMAL LOW (ref 6–20)
CO2: 28 mmol/L (ref 22–32)
Calcium: 8.1 mg/dL — ABNORMAL LOW (ref 8.9–10.3)
Chloride: 98 mmol/L (ref 98–111)
Creatinine, Ser: 0.34 mg/dL — ABNORMAL LOW (ref 0.44–1.00)
GFR, Estimated: >60 mL/min (ref >60)
Glucose, Bld: 103 mg/dL — ABNORMAL HIGH (ref 70–99)
Potassium: 2.8 mmol/L — ABNORMAL LOW (ref 3.5–5.1)
Sodium: 132 mmol/L — ABNORMAL LOW (ref 135–145)

## 2020-12-05 LAB — CBC
HCT: 30.5 % — ABNORMAL LOW (ref 36.0–46.0)
Hemoglobin: 9.6 g/dL — ABNORMAL LOW (ref 12.0–15.0)
MCH: 21.8 pg — ABNORMAL LOW (ref 26.0–34.0)
MCHC: 31.5 g/dL (ref 30.0–36.0)
MCV: 69.2 fL — ABNORMAL LOW (ref 80.0–100.0)
Platelets: 295 10*3/uL (ref 150–400)
RBC: 4.41 MIL/uL (ref 3.87–5.11)
RDW: 20.1 % — ABNORMAL HIGH (ref 11.5–15.5)
WBC: 12.6 10*3/uL — ABNORMAL HIGH (ref 4.0–10.5)
nRBC: 0 % (ref 0.0–0.2)

## 2020-12-05 MED ORDER — POTASSIUM CHLORIDE CRYS ER 20 MEQ PO TBCR
40.0000 meq | EXTENDED_RELEASE_TABLET | ORAL | Status: AC
  Administered 2020-12-05 (×2): 40 meq via ORAL
  Filled 2020-12-05 (×2): qty 2

## 2020-12-05 MED ORDER — IPRATROPIUM-ALBUTEROL 0.5-2.5 (3) MG/3ML IN SOLN: 3.0000 mL | RESPIRATORY_TRACT | Status: DC | PRN

## 2020-12-05 MED ORDER — DICYCLOMINE HCL 10 MG PO CAPS: 10.0000 mg | ORAL_CAPSULE | Freq: Three times a day (TID) | ORAL | 0 refills | Status: AC

## 2020-12-05 MED ORDER — POLYETHYLENE GLYCOL 3350 17 G PO PACK: 17.0000 g | PACK | Freq: Two times a day (BID) | ORAL | 0 refills | Status: DC

## 2020-12-05 MED ORDER — OXYCODONE HCL ER 10 MG PO T12A: 10.0000 mg | EXTENDED_RELEASE_TABLET | Freq: Two times a day (BID) | ORAL | 0 refills | Status: AC

## 2020-12-05 MED ORDER — HYDROMORPHONE HCL 4 MG PO TABS: 4.0000 mg | ORAL_TABLET | Freq: Four times a day (QID) | ORAL | 0 refills | Status: DC | PRN

## 2020-12-05 MED ORDER — OXYCODONE HCL ER 10 MG PO T12A: 10.0000 mg | EXTENDED_RELEASE_TABLET | Freq: Two times a day (BID) | ORAL | 0 refills | Status: DC

## 2020-12-05 MED ORDER — DOCUSATE SODIUM 100 MG PO CAPS: 100.0000 mg | ORAL_CAPSULE | Freq: Two times a day (BID) | ORAL | 0 refills | Status: AC

## 2020-12-05 NOTE — Discharge Summary (Addendum)
Physician Discharge Summary  Tina Morales TZG:[id number] DOB: January 14, 1966 DOA: 11/26/2020  PCP: Tina Pier, MD  Admit date: 11/26/2020 Discharge date: 12/06/2020  Time spent: 35 minutes  Recommendations for Outpatient Follow-up:  Outpatient PET scan per onc[facility] in 1 to 2 weeks Tina Morales, [facility] after PET scan  Discharge Diagnoses:  Principal Problem: Persistent abdominal pain   Colon narrowing up to stoma Metastatic rectal cancer Pulmonary metastasis  anemia of chronic disease   Diarrhea   Cancer associated pain   Palliative care by specialist   Discharge Condition: Stable  Diet recommendation: Soft, full liquid diet  Filed Weights   12/03/20 0517 12/04/20 0434 12/05/20 0512  Weight: 71.6 kg 72.7 kg 72.8 kg    History of present illness:  55 year old female history of metastatic rectal CA, originally diagnosed in 2018, treated with multiple regimens of chemo and XRT -5/10 admitted with abdominal pain nausea, question of enteritis/colitis on CT, treated with supportive care antibiotics and discharge -Readmitted 5/18 from Tina Morales office with recurrent/persistent abdominal pain, nausea, diarrhea, poor p.o. intake.  Hospital Course:   Recurrent abdominal pain, nausea Metastatic rectal cancer on chemotherapy- followed by Tina Morales, last dose Avastin end of April, last chemo 3 weeks ago S/p APRwith end colostomy (Tina Morales) andradicalvaginectomy (Tina Morales)09/01/2018 - CT 5/25 with mild small bowel dilatation as well as increased dilatation of the colon which extends up to the colostomy in the left lower quadrant,mild ascites, subtleperitoneal wall thickening posterior to inferior tip of right hepatic lobeconcerning for possible carcinomatosis. -Symptoms suspected to be related to narrowing around ostomy which raises concern for possible peritoneal carcinomatosis, general surgery placed a  red rubber catheter into the stoma which she was able to decompress her colostomy narrowing with improved output and symptoms. -Patient and spouse was taught by surgical team to reinsert red rubber catheter into stoma if it gets dislodged, and flushing as needed -Required prolonged hospitalization for pain control, required OxyContin and as needed hydromorphone. -Discharge home later today, on above pain regimen, close follow-up with Tina Morales after PET scan and general surgery Tina Morales  Hypokalemia/ hyponatremia/hypomagensemia. -Replaced  Metastatic rectal cancer. Sp abdominal perineal resection and vaginectomy on 2020.Positive pulmonary metastasis. -Oncology following -Last chemo was 3 weeks ago  GERDOn pantoprazole   Anxiety.  -Continue with alprazolam 1 mg tid as needed.   Anemia of chronic disease. -Stable  Discharge Exam: Vitals:   12/05/20 0315 12/05/20 0512  BP: 118/85 124/70  Pulse: (!) 109 99  Resp: 16 16  Temp: 97.7 F (36.5 C) 97.9 F (36.6 C)  SpO2: 98% 99%    General: AAOx3 Cardiovascular: S1S2/RRR Respiratory: decreased BS at bases  Discharge Instructions   Discharge Instructions    Diet - low sodium heart healthy   Complete by: As directed    Increase activity slowly   Complete by: As directed      Allergies as of 12/05/2020      Reactions   Sulfa Antibiotics    Patient unaware of side effects, she was told when she was a child that she was allergic      Medication List    STOP taking these medications   HYDROcodone-acetaminophen 5-325 MG tablet Commonly known as: Norco     TAKE these medications   acetaminophen 500 MG tablet Commonly known as: TYLENOL Take 1,000 mg by mouth every 8 (eight) hours as needed (PAIN).   ALPRAZolam 0.5 MG tablet Commonly known as: Duanne Moron  Take 1 tablet (0.5 mg total) by mouth 2 (two) times daily as needed for anxiety. What changed:   how much to take  when to take this   bisacodyl 5 MG EC  tablet Commonly known as: DULCOLAX Take 10 mg by mouth daily as needed for moderate constipation. Takes for few days after each tx   dexamethasone 4 MG tablet Commonly known as: DECADRON #2 tablets twice daily x 3 days. Start day after chemo treatment   dicyclomine 10 MG capsule Commonly known as: BENTYL Take 1 capsule (10 mg total) by mouth 3 (three) times daily before meals.   diphenoxylate-atropine 2.5-0.025 MG tablet Commonly known as: LOMOTIL Take 2 tablets by mouth 4 (four) times daily as needed for diarrhea or loose stools.   docusate sodium 100 MG capsule Commonly known as: COLACE Take 1 capsule (100 mg total) by mouth 2 (two) times daily.   HYDROmorphone 4 MG tablet Commonly known as: DILAUDID Take 1 tablet (4 mg total) by mouth every 6 (six) hours as needed for up to 8 days for moderate pain or severe pain.   lidocaine-prilocaine cream Commonly known as: EMLA Apply 1 application topically as needed (port).   loratadine 10 MG tablet Commonly known as: CLARITIN Take 10 mg by mouth daily as needed for allergies.   Magnesium Oxide 250 MG Tabs Take 500 mg by mouth 2 (two) times daily.   omeprazole 20 MG capsule Commonly known as: PRILOSEC Take 20 mg by mouth at bedtime.   oxyCODONE 10 mg 12 hr tablet Commonly known as: OXYCONTIN Take 1 tablet (10 mg total) by mouth every 12 (twelve) hours for 5 days.   polyethylene glycol 17 g packet Commonly known as: MiraLax Take 17 g by mouth 2 (two) times daily. What changed:   medication strength  when to take this  reasons to take this   potassium chloride SA 20 MEQ tablet Commonly known as: KLOR-CON Take 1 tablet (20 mEq total) by mouth 2 (two) times daily.   prochlorperazine 10 MG tablet Commonly known as: COMPAZINE Take 1 tablet (10 mg total) by mouth every 6 (six) hours as needed for nausea or vomiting.      Allergies  Allergen Reactions  . Sulfa Antibiotics     Patient unaware of side effects, she was  told when she was a child that she was allergic    Follow-up Information    [name], MD. Go on 12/11/2020.   Specialties: General Surgery, Colon and Rectal Surgery Why: Your appointment is 12/11/20 at 10:10 am Please arrive 15 minutes prior to your appointment to check in and fill out paperwork. Contact information: [place] [address] [phone]        Tina Pier, MD Follow up.   Specialty: Oncology Contact information: [place] [address] [phone]                The results of significant diagnostics from this hospitalization (including imaging, microbiology, ancillary and laboratory) are listed below for reference.    Significant Diagnostic Studies: CT CHEST W CONTRAST  Result Date: 11/18/2020 CLINICAL DATA:  Abdominal pain for 1 week. History of colon cancer. On chemotherapy. Prior colostomy. EXAM: CT CHEST, ABDOMEN, AND PELVIS WITH CONTRAST TECHNIQUE: Multidetector CT imaging of the chest, abdomen and pelvis was performed following the standard protocol during bolus administration of intravenous contrast. CONTRAST:  74mL OMNIPAQUE IOHEXOL 300 MG/ML  SOLN COMPARISON:  CT 09/01/2020 FINDINGS: CT  CHEST FINDINGS Cardiovascular: Port in the anterior chest wall with tip in distal SVC. Coronary artery calcification and aortic atherosclerotic calcification. Mediastinum/Nodes: No axillary or supraclavicular adenopathy. No mediastinal or hilar adenopathy. No pericardial fluid. Esophagus normal. Lungs/Pleura: Bilateral pulmonary nodules again demonstrated. Nodules appear very similar to CT 09/01/2020. Example nodule in the superior segment of the RIGHT lower lobe measures 9 mm (image 55) compared to 10 mm on prior. Nodule in the RIGHT upper lobe measures 9 mm (image 57) compared with 9 mm. Nodule in the lateral aspect of the LEFT lower lobe measures 6 mm (image 85) compared with 5 mm. Peripheral LEFT upper lobe nodule  measures 5 mm (image 67) compared with 6 mm. No new nodules are identified. Musculoskeletal: No aggressive osseous lesion. CT ABDOMEN AND PELVIS FINDINGS Hepatobiliary: No focal hepatic lesion. Small amount pericholecystic fluid is new from prior (image 16/series 2). No intrahepatic duct dilatation. Common bile duct normal caliber. Pancreas: Pancreas is normal. No ductal dilatation. No pancreatic inflammation. Spleen: Spleen normal Adrenals/urinary tract: Adrenal glands and kidneys are normal. The ureters and bladder normal. Stomach/Bowel: Stomach and duodenum normal. There is mild submucosal edema within the loops of the small bowel deep within the pelvis (image 107/series 602). The small amount of fluid within the pelvis which is similar prior. There is fluid stool throughout the colon. The distal colon leading up to the ostomy demonstrates sub mucosal edema and mucosal enhancement (image 90/62 for example). There is mucosal enhancement through the colon exiting the ostomy. No evidence of local recurrence in the proctectomy site. There is no evidence of bowel obstruction. Vascular/Lymphatic: Abdominal aorta is normal caliber with atherosclerotic calcification. There is no retroperitoneal or periportal lymphadenopathy. No pelvic lymphadenopathy. Reproductive: Post hysterectomy.  Adnexa unremarkable Other: Along the posterior margin of the RIGHT hepatic lobe there is peritoneal thickening with potential enhancement. Small amount fluid at this site on comparison exam however the enhancement is new (image 63/62). Nodular enhancement measures 10 mm x 7 mm image 64/602). Nodularity also seen on coronal image 24/605) Musculoskeletal: No aggressive osseous lesion. IMPRESSION: Chest Impression: 1. Stable bilateral pulmonary nodular metastasis. 2. No mediastinal lymphadenopathy. Abdomen / Pelvis Impression: 1. Submucosal edema and mucosal enhancement involving the distal colon leading up to the colostomy as well as loops of  small bowel in the pelvis. Findings are most consistent with colitis and enteritis. No high-grade obstruction. a 2. Concern for subtle peritoneal nodular enhancement along the posterior aspect the RIGHT hepatic lobe. Recommend close attention on follow-up with CT scan or FDG PET scan. These results will be called to the ordering clinician or representative by the Radiologist Assistant, and communication documented in the PACS or [employer]. Electronically Signed   By: Suzy Bouchard M.D.   On: 11/18/2020 16:42   CT ABDOMEN PELVIS W CONTRAST  Result Date: 12/03/2020 CLINICAL DATA:  Abdominal distension. History of metastatic rectal cancer. EXAM: CT ABDOMEN AND PELVIS WITH CONTRAST TECHNIQUE: Multidetector CT imaging of the abdomen and pelvis was performed using the standard protocol following bolus administration of intravenous contrast. CONTRAST:  171mL OMNIPAQUE IOHEXOL 300 MG/ML  SOLN COMPARISON:  Nov 18, 2020. FINDINGS: Lower chest: Pulmonary nodules are noted in the visualized lung bases concerning for metastatic disease. Hepatobiliary: No focal liver abnormality is seen. No gallstones, gallbladder wall thickening, or biliary dilatation. Pancreas: Unremarkable. No pancreatic ductal dilatation or surrounding inflammatory changes. Spleen: Normal in size without focal abnormality. Adrenals/Urinary Tract: Adrenal glands are unremarkable. Kidneys are normal, without renal calculi, focal lesion,  or hydronephrosis. Bladder is unremarkable. Stomach/Bowel: The stomach is unremarkable. Mildly dilated small bowel loops are noted. Colostomy is again noted in the left lower quadrant. Increased dilatation of the colon is noted which extends to the colostomy, concerning for distal colonic obstruction. Potentially this may be related to inflammation of the colostomy itself. Vascular/Lymphatic: Aortic atherosclerosis. No enlarged abdominal or pelvic lymph nodes. Reproductive: Status post hysterectomy. No adnexal  masses. Other: Mild ascites is seen in the pelvis as well as around the liver and spleen. Subtle peritoneal wall thickening is seen posterior to inferior tip of right hepatic lobe; peritoneal implant or carcinomatosis cannot be excluded. Musculoskeletal: No acute or significant osseous findings. IMPRESSION: Pulmonary nodules are again noted in the visualized lung bases concerning for metastatic disease. Mild small bowel dilatation is noted, as well as increased dilatation of the colon which extends up to the colostomy in the left lower quadrant, concerning for distal colonic obstruction. Potentially this may related to inflammation of the colostomy itself. Mild ascites is noted in the pelvis as well as around the liver and spleen. Subtle peritoneal wall thickening is seen posterior to inferior tip of right hepatic lobe; peritoneal implant or carcinomatosis cannot be excluded. Electronically Signed   By: Marijo Conception M.D.   On: 12/03/2020 20:05   CT Abdomen Pelvis W Contrast  Result Date: 11/18/2020 CLINICAL DATA:  Abdominal pain for 1 week. History of colon cancer. On chemotherapy. Prior colostomy. EXAM: CT CHEST, ABDOMEN, AND PELVIS WITH CONTRAST TECHNIQUE: Multidetector CT imaging of the chest, abdomen and pelvis was performed following the standard protocol during bolus administration of intravenous contrast. CONTRAST:  83mL OMNIPAQUE IOHEXOL 300 MG/ML  SOLN COMPARISON:  CT 09/01/2020 FINDINGS: CT CHEST FINDINGS Cardiovascular: Port in the anterior chest wall with tip in distal SVC. Coronary artery calcification and aortic atherosclerotic calcification. Mediastinum/Nodes: No axillary or supraclavicular adenopathy. No mediastinal or hilar adenopathy. No pericardial fluid. Esophagus normal. Lungs/Pleura: Bilateral pulmonary nodules again demonstrated. Nodules appear very similar to CT 09/01/2020. Example nodule in the superior segment of the RIGHT lower lobe measures 9 mm (image 55) compared to 10 mm on  prior. Nodule in the RIGHT upper lobe measures 9 mm (image 57) compared with 9 mm. Nodule in the lateral aspect of the LEFT lower lobe measures 6 mm (image 85) compared with 5 mm. Peripheral LEFT upper lobe nodule measures 5 mm (image 67) compared with 6 mm. No new nodules are identified. Musculoskeletal: No aggressive osseous lesion. CT ABDOMEN AND PELVIS FINDINGS Hepatobiliary: No focal hepatic lesion. Small amount pericholecystic fluid is new from prior (image 16/series 2). No intrahepatic duct dilatation. Common bile duct normal caliber. Pancreas: Pancreas is normal. No ductal dilatation. No pancreatic inflammation. Spleen: Spleen normal Adrenals/urinary tract: Adrenal glands and kidneys are normal. The ureters and bladder normal. Stomach/Bowel: Stomach and duodenum normal. There is mild submucosal edema within the loops of the small bowel deep within the pelvis (image 107/series 602). The small amount of fluid within the pelvis which is similar prior. There is fluid stool throughout the colon. The distal colon leading up to the ostomy demonstrates sub mucosal edema and mucosal enhancement (image 90/62 for example). There is mucosal enhancement through the colon exiting the ostomy. No evidence of local recurrence in the proctectomy site. There is no evidence of bowel obstruction. Vascular/Lymphatic: Abdominal aorta is normal caliber with atherosclerotic calcification. There is no retroperitoneal or periportal lymphadenopathy. No pelvic lymphadenopathy. Reproductive: Post hysterectomy.  Adnexa unremarkable Other: Along the posterior margin of  the RIGHT hepatic lobe there is peritoneal thickening with potential enhancement. Small amount fluid at this site on comparison exam however the enhancement is new (image 63/62). Nodular enhancement measures 10 mm x 7 mm image 64/602). Nodularity also seen on coronal image 24/605) Musculoskeletal: No aggressive osseous lesion. IMPRESSION: Chest Impression: 1. Stable  bilateral pulmonary nodular metastasis. 2. No mediastinal lymphadenopathy. Abdomen / Pelvis Impression: 1. Submucosal edema and mucosal enhancement involving the distal colon leading up to the colostomy as well as loops of small bowel in the pelvis. Findings are most consistent with colitis and enteritis. No high-grade obstruction. a 2. Concern for subtle peritoneal nodular enhancement along the posterior aspect the RIGHT hepatic lobe. Recommend close attention on follow-up with CT scan or FDG PET scan. These results will be called to the ordering clinician or representative by the Radiologist Assistant, and communication documented in the PACS or [employer]. Electronically Signed   By: Suzy Bouchard M.D.   On: 11/18/2020 16:42   DG Chest Port 1 View  Result Date: 11/18/2020 CLINICAL DATA:  Abdominal pain and vomiting for 1 week. History of colon cancer. EXAM: PORTABLE CHEST 1 VIEW COMPARISON:  CT chest, abdomen and pelvis 09/01/2020. FINDINGS: Right IJ approach Port-A-Cath is in place. The lungs are emphysematous. A few scattered pulmonary nodules consistent with known metastatic disease are seen. No consolidative process, pneumothorax or effusion. Heart size is normal. Aortic atherosclerosis. IMPRESSION: No acute disease. Pulmonary nodules consistent with known metastatic disease. Aortic Atherosclerosis (ICD10-I70.0) and Emphysema (ICD10-J43.9). Electronically Signed   By: Inge Rise M.D.   On: 11/18/2020 14:02   DG Abd 2 Views  Result Date: 11/28/2020 CLINICAL DATA:  Colon carcinoma.  Abdominal pain EXAM: ABDOMEN - 2 VIEW COMPARISON:  CT abdomen and pelvis Nov 18, 2020 FINDINGS: Supine and upright images were obtained. There is mild stool volume in the colon. No appreciable bowel dilatation or air-fluid levels to suggest bowel obstruction. No free air. There is mild left base atelectasis. Lung bases otherwise clear. Bibasilar nodular opacities consistent with metastatic foci. IMPRESSION: No  evident bowel obstruction or free air. Mild left base atelectasis. Nodular opacities in the lung bases consistent with metastases, noted on recent CT as well. Electronically Signed   By: Lowella Grip III M.D.   On: 11/28/2020 11:13   [place] ASCITES (ABDOMEN LIMITED)  Result Date: 12/01/2020 CLINICAL DATA:  Colorectal cancer, abdominal distension, assess for therapeutic paracentesis EXAM: LIMITED ABDOMEN ULTRASOUND FOR ASCITES TECHNIQUE: Limited ultrasound survey for ascites was performed in all four abdominal quadrants. COMPARISON:  11/18/2020 FINDINGS: Survey ultrasound performed of the abdominal 4 quadrants. Small amount of right upper quadrant perihepatic ascites. No large volume ascites appreciated that warrants therapeutic paracentesis. Procedure not performed. IMPRESSION: Small amount of right upper quadrant perihepatic ascites. Electronically Signed   By: Jerilynn Mages.  Shick M.D.   On: 12/01/2020 12:05    Microbiology: Recent Results (from the past 240 hour(s))  C Difficile Quick Screen w PCR reflex     Status: None   Collection Time: 11/26/20  5:09 PM   Specimen: STOOL  Result Value Ref Range Status   C Diff antigen NEGATIVE NEGATIVE Final   C Diff toxin NEGATIVE NEGATIVE Final   C Diff interpretation No C. difficile detected.  Final    Comment: Performed at [facility], [place] [address], [place], [place] [address]  Gastrointestinal Panel by PCR , Stool     Status: None   Collection Time: 11/26/20  5:09 PM  Specimen: STOOL  Result Value Ref Range Status   Campylobacter species NOT DETECTED NOT DETECTED Final   Plesimonas shigelloides NOT DETECTED NOT DETECTED Final   Salmonella species NOT DETECTED NOT DETECTED Final   Yersinia enterocolitica NOT DETECTED NOT DETECTED Final   Vibrio species NOT DETECTED NOT DETECTED Final   Vibrio cholerae NOT DETECTED NOT DETECTED Final   Enteroaggregative E coli (EAEC) NOT DETECTED NOT DETECTED Final   Enteropathogenic E coli (EPEC)  NOT DETECTED NOT DETECTED Final   Enterotoxigenic E coli (ETEC) NOT DETECTED NOT DETECTED Final   Shiga like toxin producing E coli (STEC) NOT DETECTED NOT DETECTED Final   Shigella/Enteroinvasive E coli (EIEC) NOT DETECTED NOT DETECTED Final   Cryptosporidium NOT DETECTED NOT DETECTED Final   Cyclospora cayetanensis NOT DETECTED NOT DETECTED Final   Entamoeba histolytica NOT DETECTED NOT DETECTED Final   Giardia lamblia NOT DETECTED NOT DETECTED Final   Adenovirus F40/41 NOT DETECTED NOT DETECTED Final   Astrovirus NOT DETECTED NOT DETECTED Final   Norovirus GI/GII NOT DETECTED NOT DETECTED Final   Rotavirus A NOT DETECTED NOT DETECTED Final   Sapovirus (I, II, IV, and V) NOT DETECTED NOT DETECTED Final    Comment: Performed at [facility], [address] [place], [place] [address]  SARS CORONAVIRUS 2 (TAT 6-24 HRS) Nasopharyngeal Nasopharyngeal Swab     Status: None   Collection Time: 11/26/20  5:09 PM   Specimen: Nasopharyngeal Swab  Result Value Ref Range Status   SARS Coronavirus 2 NEGATIVE NEGATIVE Final    Comment: (NOTE) SARS-CoV-2 target nucleic acids are NOT DETECTED.  The SARS-CoV-2 RNA is generally detectable in upper and lower respiratory specimens during the acute phase of infection. Negative results do not preclude SARS-CoV-2 infection, do not rule out co-infections with other pathogens, and should not be used as the sole basis for treatment or other patient management decisions. Negative results must be combined with clinical observations, patient history, and epidemiological information. The expected result is Negative.  Fact Sheet for Patients: SugarRoll.be  Fact Sheet for Healthcare Providers: https://www.woods-mathews.com/  This test is not yet approved or cleared by the [place] FDA and  has been authorized for detection and/or diagnosis of SARS-CoV-2 by FDA under an Emergency Use Authorization (EUA).  This EUA will remain  in effect (meaning this test can be used) for the duration of the COVID-19 declaration under Se ction 564(b)(1) of the Act, 21 U.S.C. section 360bbb-3(b)(1), unless the authorization is terminated or revoked sooner.  Performed at [facility], [place] [address], [place], [place] [address]      Labs: Basic Metabolic Panel: Recent Labs  Lab 12/02/20 0504 12/03/20 0547 12/04/20 0533 12/05/20 0533  NA 131* 131* 132* 132*  K 3.3* 3.8 3.3* 2.8*  CL 96* 95* 93* 98  CO2 26 26 30 28   GLUCOSE 103* 105* 99 103*  BUN <5* 6 6 5*  CREATININE 0.45 <0.30* <0.30* 0.34*  CALCIUM 8.2* 8.5* 8.2* 8.1*  MG  --  2.3  --   --    Liver Function Tests: Recent Labs  Lab 12/04/20 0533  AST 24  ALT 11  ALKPHOS 179*  BILITOT 1.0  PROT 5.0*  ALBUMIN 2.4*   No results for input(s): LIPASE, AMYLASE in the last 168 hours. No results for input(s): AMMONIA in the last 168 hours. CBC: Recent Labs  Lab 12/02/20 0504 12/03/20 0547 12/03/20 1204 12/04/20 0533 12/05/20 0533  WBC 13.5* 13.5* 13.3* 14.4* 12.6*  NEUTROABS  10.2* 10.4*  --   --   --   HGB 9.7* 10.2* 10.4* 9.4* 9.6*  HCT 31.7* 32.8* 33.3* 30.1* 30.5*  MCV 69.5* 69.8* 70.1* 69.5* 69.2*  PLT 300 287 247 285 295   Cardiac Enzymes: No results for input(s): CKTOTAL, CKMB, CKMBINDEX, TROPONINI in the last 168 hours. BNP: BNP (last 3 results) No results for input(s): BNP in the last 8760 hours.  ProBNP (last 3 results) No results for input(s): PROBNP in the last 8760 hours.  CBG: No results for input(s): GLUCAP in the last 168 hours.     Signed:  Domenic Polite MD.  [facility] 12/05/2020, 1:37 PM

## 2020-12-05 NOTE — Plan of Care (Signed)

## 2020-12-05 NOTE — Consult Note (Signed)
[facility] Nurse ostomy consult note Stoma type/location: LLQ colosotmy Stomal assessment/size: 1 and 1/4 inch round, red, moist, lumen in center Peristomal assessment: not seen today Treatment options for stomal/peristomal skin: patient is wearing a 1-piece compressible convex ostomy pouching system and just changed this morning. Output: soft seedy tool pieces in liquid effluent Ostomy pouching: 1pc.  Education provided: Patient and spouse instructed by CS PA-C B. Meuth on stoma intubation and irrigation for relief of symptoms. Demonstrated procedure x2; spouse and patient have no questions.  37mls instilled in two 76ml irrigations with approximately 19mls return with semi-formed stool.  Three acrylic graduates provided for emptying and containment of irrigant also with large bore red rubber catheter (2) and an irrigation set with 60 ml [place]y syringe.    Enrolled patient in [facility] program: Yes, during previous admissions   [facility] nursing team will not follow, but will remain available to this patient, the nursing and medical teams.  Please re-consult if needed. Thanks, Maudie Flakes, MSN, RN, [place], Arther Abbott  Pager# [phone]

## 2020-12-05 NOTE — Discharge Instructions (Signed)
Full Liquid Diet A full liquid diet refers to fluids and foods that are liquid, or will become liquid, at room temperature. This diet should only be used for a short period of time to help you recover from illness or surgery. Your health care provider or dietitian will help determine when it is safe to eat regular foods again. What are tips for following this plan? Reading food labels  Check food labels of nutrition shakes for the amount of protein. Look for nutrition shakes that have at least 8-10 grams of protein in each serving.  Choose drinks, such as milks and juices, that are "fortified" or "enriched." This means that vitamins and minerals have been added. Shopping  Buy pre-made nutrition shakes to keep on hand.  To vary your choices, buy different flavors of milks and shakes. Meal planning  Choose flavors and foods that you enjoy.  To make sure you get enough energy and calories from food: ? Have three full liquid meals each day. Have a liquid snack between each meal. ? Drink 6-8 oz (177-237 mL) of a nutritional supplement shake with meals or as snacks. ? Add protein powder, powdered milk, milk, or yogurt to shakes to increase the amount of protein.  Drink at least one serving a day of citrus fruit juice or fruit juice that has vitamin C added. General guidelines  Before starting the full liquid diet, check with your health care provider to know what foods you should avoid. These may include full-fat or high-fiber liquids.  You may have any liquid or food that becomes a liquid at room temperature. The food is considered a liquid if it can be poured off a spoon at room temperature.  Do not drink alcohol unless approved by your health care provider.  This diet gives you most of the nutrients that you need for energy, but you may not get enough of certain vitamins, minerals, and fiber. Make sure to talk to your health care provider or dietitian about: ? How many calories you need  to eat each day. ? How much fluid you should have each day. ? Taking a multivitamin or a nutritional supplement. What foods should I eat? Fruits Fruit juice without pulp. Strained fruit pures (seeds and skins removed). Vegetables Pulp-free tomato or vegetable juice. Vegetables pured in soup. Grains Thin, hot cereal, such as farina. Soft-cooked pasta or rice pured in soup. Meats and other proteins Beef, chicken, and fish broths. Powdered protein supplements. Dairy Milk and milk-based beverages, including milk shakes and instant breakfast mixes. Smooth yogurt. Pured cottage cheese. Fats and oils Melted margarine and butter. Cream. [place], almond, avocado, corn, grapeseed, sunflower, and sesame oils. Gravy. Beverages Water. Coffee and tea (caffeinated or decaffeinated). Cocoa. Liquid nutritional supplements. Soft drinks. Nondairy milks, such as almond, coconut, rice, or soy milk. Sweets and desserts Custard. Pudding. Flavored gelatin. Smooth ice cream (without nuts or candy pieces). Sherbet. Frozen ice pops. [place] ice. Pudding pops. Seasonings and condiments Salt and pepper. Spices. Vinegar. Ketchup. Yellow mustard. Smooth sauces, such as [place]aise, cheese sauce, or white sauce. Soy sauce. Syrup. Honey. Jelly (without fruit pieces). Other foods Cocoa powder. Cream soups. Strained soups. The items listed above may not be a complete list of foods and beverages you can eat. Contact a dietitian for more information.   What foods should I avoid? Fruits All whole fresh, frozen, or canned fruits. Vegetables All whole fresh, frozen, or canned vegetables. Grains Whole grains. Pasta. Rice. Cold cereal. Bread. Crackers. Meats and other  proteins All cuts of meat, poultry, and fish. Eggs. Tofu and soy protein. Nuts and nut butters. Precooked or cured meat, such as sausages or meat loaves. Dairy Hard cheese. Yogurt with fruit chunks. Fats and oils Coconut oil. Palm oil. Lard. Cold  butter. Sweets and desserts Ice cream or other frozen desserts that contain solids, such as nuts, chocolate chips, and pieces of cookies. Cakes. Cookies. Candy. Seasonings and condiments Stone-ground mustard. Other foods Soups with chunks or pieces. The items listed above may not be a complete list of foods and beverages you should avoid. Contact a dietitian for more information. Summary  A full liquid diet refers to fluids and foods that are liquid or will become liquid at room temperature.  This diet should only be used for a short period of time to help you recover from illness or surgery. Ask your health care provider or dietitian when it is safe for you to eat regular foods.  To make sure you get enough calories and nutrients, eat three meals each day with snacks in between. Drink pre-made nutritional supplement shakes or add protein powder to homemade shakes. Talk to your health care provider about taking a vitamin and mineral supplement. This information is not intended to replace advice given to you by your health care provider. Make sure you discuss any questions you have with your health care provider. Document Revised: 04/15/2020 Document Reviewed: 04/15/2020 [employer] Patient Education  2021 [employer].

## 2020-12-05 NOTE — Progress Notes (Addendum)
HEMATOLOGY-ONCOLOGY PROGRESS NOTE  SUBJECTIVE: Noted improvement in pain after insertion of red rubber catheter into the stoma yesterday morning.  He had 800 cc of stool out.  Catheter fell out last evening.  Noted increase in pain almost immediately after catheter falling out.  General surgery to see today to replace catheter.  Oncology History  Rectal cancer ([place])  04/27/2018 Initial Diagnosis   Rectal cancer ([place])   05/11/2018 -  Chemotherapy    Patient is on Treatment Plan: COLORECTAL FOLFOX + ZIRABEV Q 21 DAYS      06/13/2019 - 10/15/2020 Chemotherapy    Patient is on Treatment Plan: COLORECTAL FOLFOX + ZIRABEV Q 21 DAYS        PHYSICAL EXAMINATION:  Vitals:   12/05/20 0315 12/05/20 0512  BP: 118/85 124/70  Pulse: (!) 109 99  Resp: 16 16  Temp: 97.7 F (36.5 C) 97.9 F (36.6 C)  SpO2: 98% 99%   Filed Weights   12/03/20 0517 12/04/20 0434 12/05/20 0512  Weight: 71.6 kg 72.7 kg 72.8 kg    Intake/Output from previous day: 05/26 0701 - 05/27 0700 In: 240 [P.O.:240] Out: 7353 [Stool:1775]   OROPHARYNX:no exudate, no erythema and lips, buccal mucosa, and tongue normal  LUNGS: Clear this morning HEART: Regular, tachycardia, no lower extremity edema ABDOMEN: Mildly distended, diffuse tenderness, left lower quadrant colostomy with small amount of liquid brown stool  NEURO: alert & oriented x 3 with fluent speech, no focal motor/sensory deficits  Port-A-Cath without erythema  LABORATORY DATA:  I have reviewed the data as listed CMP Latest Ref Rng & Units 12/05/2020 12/04/2020 12/03/2020  Glucose 70 - 99 mg/dL 103(H) 99 105(H)  BUN 6 - 20 mg/dL 5(L) 6 6  Creatinine 0.44 - 1.00 mg/dL 0.34(L) <0.30(L) <0.30(L)  Sodium 135 - 145 mmol/L 132(L) 132(L) 131(L)  Potassium 3.5 - 5.1 mmol/L 2.8(L) 3.3(L) 3.8  Chloride 98 - 111 mmol/L 98 93(L) 95(L)  CO2 22 - 32 mmol/L 28 30 26   Calcium 8.9 - 10.3 mg/dL 8.1(L) 8.2(L) 8.5(L)  Total Protein 6.5 - 8.1 g/dL - 5.0(L) -  Total  Bilirubin 0.3 - 1.2 mg/dL - 1.0 -  Alkaline Phos 38 - 126 U/L - 179(H) -  AST 15 - 41 U/L - 24 -  ALT 0 - 44 U/L - 11 -    Lab Results  Component Value Date   WBC 12.6 (H) 12/05/2020   HGB 9.6 (L) 12/05/2020   HCT 30.5 (L) 12/05/2020   MCV 69.2 (L) 12/05/2020   PLT 295 12/05/2020   NEUTROABS 10.4 (H) 12/03/2020    CT CHEST W CONTRAST  Result Date: 11/18/2020 CLINICAL DATA:  Abdominal pain for 1 week. History of colon cancer. On chemotherapy. Prior colostomy. EXAM: CT CHEST, ABDOMEN, AND PELVIS WITH CONTRAST TECHNIQUE: Multidetector CT imaging of the chest, abdomen and pelvis was performed following the standard protocol during bolus administration of intravenous contrast. CONTRAST:  68m OMNIPAQUE IOHEXOL 300 MG/ML  SOLN COMPARISON:  CT 09/01/2020 FINDINGS: CT CHEST FINDINGS Cardiovascular: Port in the anterior chest wall with tip in distal SVC. Coronary artery calcification and aortic atherosclerotic calcification. Mediastinum/Nodes: No axillary or supraclavicular adenopathy. No mediastinal or hilar adenopathy. No pericardial fluid. Esophagus normal. Lungs/Pleura: Bilateral pulmonary nodules again demonstrated. Nodules appear very similar to CT 09/01/2020. Example nodule in the superior segment of the RIGHT lower lobe measures 9 mm (image 55) compared to 10 mm on prior. Nodule in the RIGHT upper lobe measures 9 mm (image 57) compared with 9 mm.  Nodule in the lateral aspect of the LEFT lower lobe measures 6 mm (image 85) compared with 5 mm. Peripheral LEFT upper lobe nodule measures 5 mm (image 67) compared with 6 mm. No new nodules are identified. Musculoskeletal: No aggressive osseous lesion. CT ABDOMEN AND PELVIS FINDINGS Hepatobiliary: No focal hepatic lesion. Small amount pericholecystic fluid is new from prior (image 16/series 2). No intrahepatic duct dilatation. Common bile duct normal caliber. Pancreas: Pancreas is normal. No ductal dilatation. No pancreatic inflammation. Spleen: Spleen  normal Adrenals/urinary tract: Adrenal glands and kidneys are normal. The ureters and bladder normal. Stomach/Bowel: Stomach and duodenum normal. There is mild submucosal edema within the loops of the small bowel deep within the pelvis (image 107/series 602). The small amount of fluid within the pelvis which is similar prior. There is fluid stool throughout the colon. The distal colon leading up to the ostomy demonstrates sub mucosal edema and mucosal enhancement (image 90/62 for example). There is mucosal enhancement through the colon exiting the ostomy. No evidence of local recurrence in the proctectomy site. There is no evidence of bowel obstruction. Vascular/Lymphatic: Abdominal aorta is normal caliber with atherosclerotic calcification. There is no retroperitoneal or periportal lymphadenopathy. No pelvic lymphadenopathy. Reproductive: Post hysterectomy.  Adnexa unremarkable Other: Along the posterior margin of the RIGHT hepatic lobe there is peritoneal thickening with potential enhancement. Small amount fluid at this site on comparison exam however the enhancement is new (image 63/62). Nodular enhancement measures 10 mm x 7 mm image 64/602). Nodularity also seen on coronal image 24/605) Musculoskeletal: No aggressive osseous lesion. IMPRESSION: Chest Impression: 1. Stable bilateral pulmonary nodular metastasis. 2. No mediastinal lymphadenopathy. Abdomen / Pelvis Impression: 1. Submucosal edema and mucosal enhancement involving the distal colon leading up to the colostomy as well as loops of small bowel in the pelvis. Findings are most consistent with colitis and enteritis. No high-grade obstruction. a 2. Concern for subtle peritoneal nodular enhancement along the posterior aspect the RIGHT hepatic lobe. Recommend close attention on follow-up with CT scan or FDG PET scan. These results will be called to the ordering clinician or representative by the Radiologist Assistant, and communication documented in the  PACS or [employer]. Electronically Signed   By: Suzy Bouchard M.D.   On: 11/18/2020 16:42   CT ABDOMEN PELVIS W CONTRAST  Result Date: 12/03/2020 CLINICAL DATA:  Abdominal distension. History of metastatic rectal cancer. EXAM: CT ABDOMEN AND PELVIS WITH CONTRAST TECHNIQUE: Multidetector CT imaging of the abdomen and pelvis was performed using the standard protocol following bolus administration of intravenous contrast. CONTRAST:  127m OMNIPAQUE IOHEXOL 300 MG/ML  SOLN COMPARISON:  Nov 18, 2020. FINDINGS: Lower chest: Pulmonary nodules are noted in the visualized lung bases concerning for metastatic disease. Hepatobiliary: No focal liver abnormality is seen. No gallstones, gallbladder wall thickening, or biliary dilatation. Pancreas: Unremarkable. No pancreatic ductal dilatation or surrounding inflammatory changes. Spleen: Normal in size without focal abnormality. Adrenals/Urinary Tract: Adrenal glands are unremarkable. Kidneys are normal, without renal calculi, focal lesion, or hydronephrosis. Bladder is unremarkable. Stomach/Bowel: The stomach is unremarkable. Mildly dilated small bowel loops are noted. Colostomy is again noted in the left lower quadrant. Increased dilatation of the colon is noted which extends to the colostomy, concerning for distal colonic obstruction. Potentially this may be related to inflammation of the colostomy itself. Vascular/Lymphatic: Aortic atherosclerosis. No enlarged abdominal or pelvic lymph nodes. Reproductive: Status post hysterectomy. No adnexal masses. Other: Mild ascites is seen in the pelvis as well as around the liver and spleen.  Subtle peritoneal wall thickening is seen posterior to inferior tip of right hepatic lobe; peritoneal implant or carcinomatosis cannot be excluded. Musculoskeletal: No acute or significant osseous findings. IMPRESSION: Pulmonary nodules are again noted in the visualized lung bases concerning for metastatic disease. Mild small bowel  dilatation is noted, as well as increased dilatation of the colon which extends up to the colostomy in the left lower quadrant, concerning for distal colonic obstruction. Potentially this may related to inflammation of the colostomy itself. Mild ascites is noted in the pelvis as well as around the liver and spleen. Subtle peritoneal wall thickening is seen posterior to inferior tip of right hepatic lobe; peritoneal implant or carcinomatosis cannot be excluded. Electronically Signed   By: Marijo Conception M.D.   On: 12/03/2020 20:05   CT Abdomen Pelvis W Contrast  Result Date: 11/18/2020 CLINICAL DATA:  Abdominal pain for 1 week. History of colon cancer. On chemotherapy. Prior colostomy. EXAM: CT CHEST, ABDOMEN, AND PELVIS WITH CONTRAST TECHNIQUE: Multidetector CT imaging of the chest, abdomen and pelvis was performed following the standard protocol during bolus administration of intravenous contrast. CONTRAST:  20m OMNIPAQUE IOHEXOL 300 MG/ML  SOLN COMPARISON:  CT 09/01/2020 FINDINGS: CT CHEST FINDINGS Cardiovascular: Port in the anterior chest wall with tip in distal SVC. Coronary artery calcification and aortic atherosclerotic calcification. Mediastinum/Nodes: No axillary or supraclavicular adenopathy. No mediastinal or hilar adenopathy. No pericardial fluid. Esophagus normal. Lungs/Pleura: Bilateral pulmonary nodules again demonstrated. Nodules appear very similar to CT 09/01/2020. Example nodule in the superior segment of the RIGHT lower lobe measures 9 mm (image 55) compared to 10 mm on prior. Nodule in the RIGHT upper lobe measures 9 mm (image 57) compared with 9 mm. Nodule in the lateral aspect of the LEFT lower lobe measures 6 mm (image 85) compared with 5 mm. Peripheral LEFT upper lobe nodule measures 5 mm (image 67) compared with 6 mm. No new nodules are identified. Musculoskeletal: No aggressive osseous lesion. CT ABDOMEN AND PELVIS FINDINGS Hepatobiliary: No focal hepatic lesion. Small amount  pericholecystic fluid is new from prior (image 16/series 2). No intrahepatic duct dilatation. Common bile duct normal caliber. Pancreas: Pancreas is normal. No ductal dilatation. No pancreatic inflammation. Spleen: Spleen normal Adrenals/urinary tract: Adrenal glands and kidneys are normal. The ureters and bladder normal. Stomach/Bowel: Stomach and duodenum normal. There is mild submucosal edema within the loops of the small bowel deep within the pelvis (image 107/series 602). The small amount of fluid within the pelvis which is similar prior. There is fluid stool throughout the colon. The distal colon leading up to the ostomy demonstrates sub mucosal edema and mucosal enhancement (image 90/62 for example). There is mucosal enhancement through the colon exiting the ostomy. No evidence of local recurrence in the proctectomy site. There is no evidence of bowel obstruction. Vascular/Lymphatic: Abdominal aorta is normal caliber with atherosclerotic calcification. There is no retroperitoneal or periportal lymphadenopathy. No pelvic lymphadenopathy. Reproductive: Post hysterectomy.  Adnexa unremarkable Other: Along the posterior margin of the RIGHT hepatic lobe there is peritoneal thickening with potential enhancement. Small amount fluid at this site on comparison exam however the enhancement is new (image 63/62). Nodular enhancement measures 10 mm x 7 mm image 64/602). Nodularity also seen on coronal image 24/605) Musculoskeletal: No aggressive osseous lesion. IMPRESSION: Chest Impression: 1. Stable bilateral pulmonary nodular metastasis. 2. No mediastinal lymphadenopathy. Abdomen / Pelvis Impression: 1. Submucosal edema and mucosal enhancement involving the distal colon leading up to the colostomy as well as loops of small  bowel in the pelvis. Findings are most consistent with colitis and enteritis. No high-grade obstruction. a 2. Concern for subtle peritoneal nodular enhancement along the posterior aspect the RIGHT  hepatic lobe. Recommend close attention on follow-up with CT scan or FDG PET scan. These results will be called to the ordering clinician or representative by the Radiologist Assistant, and communication documented in the PACS or [employer]. Electronically Signed   By: Suzy Bouchard M.D.   On: 11/18/2020 16:42   DG Chest Port 1 View  Result Date: 11/18/2020 CLINICAL DATA:  Abdominal pain and vomiting for 1 week. History of colon cancer. EXAM: PORTABLE CHEST 1 VIEW COMPARISON:  CT chest, abdomen and pelvis 09/01/2020. FINDINGS: Right IJ approach Port-A-Cath is in place. The lungs are emphysematous. A few scattered pulmonary nodules consistent with known metastatic disease are seen. No consolidative process, pneumothorax or effusion. Heart size is normal. Aortic atherosclerosis. IMPRESSION: No acute disease. Pulmonary nodules consistent with known metastatic disease. Aortic Atherosclerosis (ICD10-I70.0) and Emphysema (ICD10-J43.9). Electronically Signed   By: Inge Rise M.D.   On: 11/18/2020 14:02   DG Abd 2 Views  Result Date: 11/28/2020 CLINICAL DATA:  Colon carcinoma.  Abdominal pain EXAM: ABDOMEN - 2 VIEW COMPARISON:  CT abdomen and pelvis Nov 18, 2020 FINDINGS: Supine and upright images were obtained. There is mild stool volume in the colon. No appreciable bowel dilatation or air-fluid levels to suggest bowel obstruction. No free air. There is mild left base atelectasis. Lung bases otherwise clear. Bibasilar nodular opacities consistent with metastatic foci. IMPRESSION: No evident bowel obstruction or free air. Mild left base atelectasis. Nodular opacities in the lung bases consistent with metastases, noted on recent CT as well. Electronically Signed   By: Lowella Grip III M.D.   On: 11/28/2020 11:13   [place] ASCITES (ABDOMEN LIMITED)  Result Date: 12/01/2020 CLINICAL DATA:  Colorectal cancer, abdominal distension, assess for therapeutic paracentesis EXAM: LIMITED ABDOMEN ULTRASOUND  FOR ASCITES TECHNIQUE: Limited ultrasound survey for ascites was performed in all four abdominal quadrants. COMPARISON:  11/18/2020 FINDINGS: Survey ultrasound performed of the abdominal 4 quadrants. Small amount of right upper quadrant perihepatic ascites. No large volume ascites appreciated that warrants therapeutic paracentesis. Procedure not performed. IMPRESSION: Small amount of right upper quadrant perihepatic ascites. Electronically Signed   By: Jerilynn Mages.  Shick M.D.   On: 12/01/2020 12:05    ASSESSMENT AND PLAN: 1. Rectal cancer-rectal mass noted on digital exam 02/10/2018, colonoscopy confirmed a him my circumferential mass in the rectum ? 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 CTs 02/10/2018-anterior rectal wall thickening, pulmonary nodules measuring up to 9 mm concerning for metastases, few round perirectal lymph nodes ? Pelvic MRI 02/20/2018-hypermetabolic low rectal mass extending to the posterior vagina with 2 small enlarged perirectal lymph nodes, T4b,N1-2.3 cm from the anal verge ? PET scan 02/23/2018-hypermetabolic rectal mass, 8 mm hypermetabolic lingular nodule, scattered small bilateral lung nodules, some calcified, a few with mildly increased activity ? Cycle 1 FOLFOXIRI8/21/2019 ? Cycle 5 FOLFOXIRI10/16/2019 ? CTs 05/01/2018-significant interval response to therapy with decreased size of the primary rectal mass lesion. Decreasing perirectal lymphadenopathy. Decreased and/or resolved pulmonary nodules. No new sites of disease identified. ? Cycle 6 FOLFOXIRI10/31/2019 ? Radiation/Xeloda 06/12/2018-completed 07/21/2018 ? Xeloda dose reduced 07/10/2018 due to mucositis and diarrhea ? CTs 08/07/2018-compared to 02/10/2018-resolved and decreased pulmonary  nodules, few  tiny residual noncalcified nodules, decreased 20 perirectal lymph nodes, soft tissue of the rectum indistinguishable from posterior wall of vagina-Vaginal involvement by tumor? ? 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 CT chest 09/29/2018-enlargement of lung nodules, not a candidate for SBRT based on discussion in GI tumor conference and with radiation oncology ? CT chest 11/27/18 - interval growth of numerous pulmonary metastases bilaterally compared to 09/29/18, no new pulmonary mets ? Cycle 1FOLFOXIRI 11/29/18 ? Cycle 2 FOLFOXIRI6/10/2018 ? Cycle 3 FOLFOXIRI 12/28/2018 ? Cycle 4 FOLFIRINOX 01/11/2019 ? CTs 01/24/2019-significant improvement of bilateral pulmonary metastases. ? Cycle 1 FOLFIRI 02/01/2019 ? Cycle 2 FOLFIRI 02/22/2019 ? Cycle 3 FOLFIRI 03/15/2019 ? Cycle 4 FOLFIRI 04/05/2019 ? Cycle 5 FOLFIRI 04/26/2019 ? CTs 05/14/2019-slight enlargement of right lung nodules, no other evidence of disease progression ? Cycle 6 FOLFIRI 05/17/2019 ? Cycle 7 FOLFIRI plus Avastin 06/13/2019 ? Cycle 8 FOLFIRI plus Avastin 07/09/2019 ? Cycle 9 FOLFIRI plus Avastin 07/25/2019 ? Cycle 10 FOLFIRI plus Avastin 08/09/2019 ? Cycle 11 FOLFIRI plus Avastin 08/22/2019 ? CTs 09/07/2019-most pulmonary nodules are stable. 1 nodule right lower lobe slightly larger. No new lung lesions. ? Cycle 12 FOLFIRI plus Avastin 09/13/2019 ? Cycle 13 FOLFIRI plus Avastin 10/04/2019 ? Cycle 14 FOLFIRI plus Avastin 10/25/2019 ? Cycle 15 FOLFIRI plus Avastin 11/14/2019 ? Cycle 16 FOLFIRI plus Avastin 12/06/2019 ? CTs neck, chest, abdomen, pelvis 12/21/2019-neck negative; mildly progressive pulmonary metastases; abdomen and pelvis negative for evidence of metastatic disease. ? Cycle 1  FOLFOX/Avastin 12/27/2019 ? Cycle 2 FOLFOX/Avastin 01/17/2020 ? Cycle 3 FOLFOX/Avastin 02/07/2020 ? Cycle 4 FOLFOX 02/28/2020, Avastin held due to need for dental evaluation possible extractions ? Cycle 5 FOLFOX 03/20/2020, Avastin held pending dental evaluation ? CTs 04/04/2020-increased size of pulmonary nodules, no evidence of metastatic disease in the abdomen or pelvis ? Cycle 6 FOLFOX 05/22/2020, Avastin held secondary to dental extractions ? Cycle 7 FOLFOX/Avastin 06/12/2020 ? Cycle 8 FOLFOX/Avastin 06/30/2020 ? Cycle 9 FOLFOX/Avastin 07/24/2020 ? Cycle 10 FOLFOX/Avastin 08/14/2020 ? CTs 09/01/2020-improvement in bilateral pulmonary metastases. ? Cycle 11 FOLFOX/Avastin 09/04/2020 ? Cycle 12 FOLFOX/Avastin 09/25/2020 ? Cycle 13 FOLFOX/Avastin 10/15/2020 ? Cycle 14 FOLFOX/Avastin 11/05/2020 ? CTs 11/18/2020-stable lung nodules, no focal hepatic lesion, small amount of new pericholecystic fluid, mild submucosal edema and small bowel loops within the pelvis, fluid stool throughout the colon with submucosal edema and enhancement in the distal colon, new nodular enhancement at the posterior margin of the right liver ? CT 12/03/2020- pulmonary nodules again noted in the lung bases, mild small bowel dilatation and increased dilatation of the colon which extends up to the colostomy in the left lower quadrant concerning for distal colonic obstruction, mild ascites subtle peritoneal wall thickening seen in the posterior to inferior tip of the right hepatic lobe, peritoneal implant or carcinomatosis cannot be excluded. 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 Imodium as needed. 6.Mucositis secondary to Xeloda. Improved 07/19/2018. 7. 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 Admission 11/18/2020 with nausea/vomiting, fever, and abdominal pain-CT consistent with enteritis/colitis 12.  Admission 11/26/2020 with nausea, abdominal pain, loose stool, and dehydration 13.  Hospital admission 11/26/2020- nausea, abdominal pain, and diarrhea  Ms. Skilling had improvement of her pain following insertion of  red rubber catheter yesterday morning.  She had a significant amount of stool output.  Catheter came out last evening and she is having increased pain this morning.  General surgery to see later today to replace right upper catheter and to teach husband how to insert catheter.  She is on OxyContin and Dilaudid for breakthrough pain.  CT showed findings concerning for bowel obstruction.  General surgery is following.  Will defer management of bowel obstruction to general surgery.  Recommendations: 1.  Continue  Dilaudid, discontinue OxyContin 2.  Continue MiraLAX, sorbitol, and Colace- adjust this regimen as recommended by Dr. Dema Severin 3.  Management of bowel obstruction per general surgery  Please call medical oncology over the weekend for questions.  We will follow-up again on Monday if she remains in the hospital.  Patient may discharged home when pain is adequately controlled and she is able to take p.o.  We will plan for outpatient PET scan.  She will need follow-up with general surgery and will also need to follow-up with [place] following the PET scan to discuss the results.   LOS: 9 days   Mikey Bussing 12/05/20 Ms. Lough was interviewed and examined.  She experienced significant improvement in abdominal distention and pain following insertion of the rubber catheter yesterday.  Hopefully she will continue to have relief with reinsertion of the rubber catheter.  I favor discontinuing OxyContin as she does not have a clear history of cancer related pain.  She can continue Dilaudid as needed.  I will also stop the scheduled dicyclomine.  She can continue a bowel  regimen as directed by Dr. Dema Severin.  We will schedule an outpatient PET scan.  Outpatient follow-up will be scheduled at the Cancer center.  I was present for greater than 50% of today's visit.  I performed medical decision making.  Julieanne Manson, MD

## 2020-12-05 NOTE — Progress Notes (Addendum)
   12/04/20 2014  Provider Notification  Provider Name/Title [facility] oncall provider; Michael Boston MD (spoke with "joe" at the answering service)  Date Provider Notified 12/04/20  Time Provider Notified 2014  Notification Type Call  Notification Reason Other (Comment) (patient reported that red catheter inserted today into stoma has fallen out of stoma & is laying in colostomy bag)  Provider response Other (Comment) (MD stated to "place 24 french foley catheter at bedside for the morning")  Date of Provider Response 12/04/20  Time of Provider Response 2018    RN entered patient's room to do bedside rounding and patient was just finishing up in restroom and getting back in bed. Patient said she was worried because the red catheter the doctor placed today in her stoma has come out. RN assessed colostomy and red 24 french "Bardex" catheter tip was laying out of stoma & inside of the bottom of patient's colostomy bag. Surgery was called and Dr. Johney Maine instructed RN to obtain a "24 French foley catheter" and to "place at the bedside for the morning". Will obtain this for the morning and continue to monitor patient for excess pain, nausea, vomiting, etc.

## 2020-12-05 NOTE — TOC Transition Note (Signed)
Transition of Care [facility]) - CM/SW Discharge Note   Patient Details  Name: Tina Morales MRN: [id number] Date of Birth: 1966/07/01  Transition of Care [facility]) CM/SW Contact:  Najir Roop, Marjie Skiff, RN Phone Number: 12/05/2020, 3:22 PM   Clinical Narrative:     [facility] consult for outpatient palliative referral. Pt lives in [place] so referral called to [place]. They will follow up with pt when she gets home.

## 2020-12-05 NOTE — Progress Notes (Signed)
Palliative Medicine Progress Note  HPI: 55 year old lady with metastatic rectal cancer recently on chemotherapy, history of colostomy and radical vaginectomy admitted with abdominal pain nausea vomiting fever CT scan 5-10 with submucosal edema mucosal enhancement, imaging also consistent with colitis and enteritis but no high-grade obstruction.  Palliative consultation for symptom management.  Subjective: Medical record reviewed. Assessed patient at the bedside. She appears uncomfortable and reports pain and nausea.  Discussed interval history since our conversation yesterday. Tina Morales confirms that her catheter come out last night and she has already been educated on how to replace it this morning. She has not yet felt much relief of abdominal pain and reports it takes a while after catheter placement before she notices improvement. She is hopeful that this will improve her pain control again soon, as this was quite effective yesterday.  We also reviewed the medication list that I provided yesterday at her request. Discussed indications for several of her medications. She is very appreciate and hopes that she can be prescribed medications for adequate pain control at home. She is also open to an outpatient palliative care referral, although she expresses control for insurance coverage.   Questions and concerns addressed. PMT will continue to support holistically.   Exam: Alert and oriented x3. Sitting up in bed, appears uncomfortable. Normal respiratory effort. Normal heart rate. Moves all extremities spontaneously. Abdomen is mildly distended and tender.  Assessment: Cancer-related pain  Plan: -Continue current pain management medications, patient's red rubber fell out and was unable to provide relief overnight -Will consult TOC for assistance with outpatient palliative care referral -PMT will continue to support holistically  Total time: 15 minutes Greater than 50% of this time was spent in  counseling and coordinating care related to the above assessment and plan.  Dorthy Cooler, PA-C Palliative Medicine Team Team phone # [phone]  Thank you for allowing the Palliative Medicine Team to assist in the care of this patient. Please utilize secure chat with additional questions, if there is no response within 30 minutes please call the above phone number.  Palliative Medicine Team providers are available by phone from 7am to 7pm daily and can be reached through the team cell phone.  Should this patient require assistance outside of these hours, please call the patient's attending physician.

## 2020-12-05 NOTE — Progress Notes (Signed)
[facility] Progress Note     Subjective: CC-  Red rubber came out last night. She has had a small amount of liquid stool out of ostomy since then. Feeling nauseated, no emesis. Tolerated full liquids yesterday.  Objective: Vital signs in last 24 hours: Temp:  [97.7 F (36.5 C)-98.7 F (37.1 C)] 97.9 F (36.6 C) (05/27 0512) Pulse Rate:  [99-119] 99 (05/27 0512) Resp:  [16] 16 (05/27 0512) BP: (104-125)/(70-90) 124/70 (05/27 0512) SpO2:  [98 %-99 %] 99 % (05/27 0512) Weight:  [72.8 kg] 72.8 kg (05/27 0512) Last BM Date: 12/04/20  Intake/Output from previous day: 05/26 0701 - 05/27 0700 In: 240 [P.O.:240] Out: 1775 [Stool:1775] Intake/Output this shift: Total I/O In: 10 [I.V.:10] Out: 175 [Stool:175]  PE: Gen:  Alert, NAD, pleasant Pulm: rate and effort normal Abd: soft, minimal distension, mild diffuse TTP, LLQ ostomy viable with small amount of liquid stool in bag >> red rubber catheter gently inserted into stoma and 100cc liquid stool was evacuated Skin: no rashes noted, warm and drydry  Lab Results:  Recent Labs    12/04/20 0533 12/05/20 0533  WBC 14.4* 12.6*  HGB 9.4* 9.6*  HCT 30.1* 30.5*  PLT 285 295   BMET Recent Labs    12/04/20 0533 12/05/20 0533  NA 132* 132*  K 3.3* 2.8*  CL 93* 98  CO2 30 28  GLUCOSE 99 103*  BUN 6 5*  CREATININE <0.30* 0.34*  CALCIUM 8.2* 8.1*   PT/INR No results for input(s): LABPROT, INR in the last 72 hours. CMP     Component Value Date/Time   NA 132 (L) 12/05/2020 0533   K 2.8 (L) 12/05/2020 0533   CL 98 12/05/2020 0533   CO2 28 12/05/2020 0533   GLUCOSE 103 (H) 12/05/2020 0533   BUN 5 (L) 12/05/2020 0533   CREATININE 0.34 (L) 12/05/2020 0533   CREATININE 0.38 (L) 11/26/2020 0919   CALCIUM 8.1 (L) 12/05/2020 0533   PROT 5.0 (L) 12/04/2020 0533   ALBUMIN 2.4 (L) 12/04/2020 0533   AST 24 12/04/2020 0533   AST 33 11/26/2020 0919   ALT 11 12/04/2020 0533   ALT 10 11/26/2020 0919   ALKPHOS 179  (H) 12/04/2020 0533   BILITOT 1.0 12/04/2020 0533   BILITOT 1.0 11/26/2020 0919   GFRNONAA >60 12/05/2020 0533   GFRNONAA >60 11/26/2020 0919   GFRAA >60 04/10/2020 0930   Lipase     Component Value Date/Time   LIPASE 34 11/18/2020 1346       Studies/Results: CT ABDOMEN PELVIS W CONTRAST  Result Date: 12/03/2020 CLINICAL DATA:  Abdominal distension. History of metastatic rectal cancer. EXAM: CT ABDOMEN AND PELVIS WITH CONTRAST TECHNIQUE: Multidetector CT imaging of the abdomen and pelvis was performed using the standard protocol following bolus administration of intravenous contrast. CONTRAST:  159mL OMNIPAQUE IOHEXOL 300 MG/ML  SOLN COMPARISON:  Nov 18, 2020. FINDINGS: Lower chest: Pulmonary nodules are noted in the visualized lung bases concerning for metastatic disease. Hepatobiliary: No focal liver abnormality is seen. No gallstones, gallbladder wall thickening, or biliary dilatation. Pancreas: Unremarkable. No pancreatic ductal dilatation or surrounding inflammatory changes. Spleen: Normal in size without focal abnormality. Adrenals/Urinary Tract: Adrenal glands are unremarkable. Kidneys are normal, without renal calculi, focal lesion, or hydronephrosis. Bladder is unremarkable. Stomach/Bowel: The stomach is unremarkable. Mildly dilated small bowel loops are noted. Colostomy is again noted in the left lower quadrant. Increased dilatation of the colon is noted which extends to the colostomy, concerning for distal colonic  obstruction. Potentially this may be related to inflammation of the colostomy itself. Vascular/Lymphatic: Aortic atherosclerosis. No enlarged abdominal or pelvic lymph nodes. Reproductive: Status post hysterectomy. No adnexal masses. Other: Mild ascites is seen in the pelvis as well as around the liver and spleen. Subtle peritoneal wall thickening is seen posterior to inferior tip of right hepatic lobe; peritoneal implant or carcinomatosis cannot be excluded. Musculoskeletal:  No acute or significant osseous findings. IMPRESSION: Pulmonary nodules are again noted in the visualized lung bases concerning for metastatic disease. Mild small bowel dilatation is noted, as well as increased dilatation of the colon which extends up to the colostomy in the left lower quadrant, concerning for distal colonic obstruction. Potentially this may related to inflammation of the colostomy itself. Mild ascites is noted in the pelvis as well as around the liver and spleen. Subtle peritoneal wall thickening is seen posterior to inferior tip of right hepatic lobe; peritoneal implant or carcinomatosis cannot be excluded. Electronically Signed   By: Marijo Conception M.D.   On: 12/03/2020 20:05    Anti-infectives: Anti-infectives (From admission, onward)   None       Assessment/Plan Anxiety GERD Anemia of chronic disease  Abdominal pain, nausea, vomiting Metastatic rectal cancer on chemotherapy - followed by Dr. Benay Spice, last dose Avastin end of April. Planning for outpatient PET scan S/p APRwith end colostomy (Dr. Dema Severin) andradicalvaginectomy (Dr. Rossi)09/01/2018 - CT 5/25 with mild small bowel dilatation as well as increased dilatation of the colon which extends up to the colostomy in the left lower quadrant, mild ascites, subtle peritoneal wall thickening posterior to inferior tip of right hepatic lobe concerning for possible carcinomatosis. - Symptoms likely related to narrowing around ostomy which is concerning for possible peritoneal carcinomatosis  ID -none VTE -lovenox FEN -FLD, miralax/colace Foley -none  Plan: Red rubber re-inserted into stoma today, patient tolerated well. Husband at bedside and feels comfortable doing this at home, and flushing with 20-30cc water as needed. I think she can go home today from our standpoint. Continue full liquid diet and BID miralax/colace. She has follow up next week with Dr. Dema Severin in our office.   LOS: 9 days    Wellington [place], [facility] 12/05/2020, 10:39 AM Please see Amion for pager number during day hours 7:00am-4:30pm

## 2020-12-05 NOTE — Progress Notes (Signed)
Per Dr. Benay Spice: Needs restaging PET skull base to thigh after hospital discharge. Order placed and managed care notified.

## 2020-12-06 DIAGNOSIS — G893 Neoplasm related pain (acute) (chronic): Secondary | ICD-10-CM

## 2020-12-06 MED ORDER — HEPARIN SOD (PORK) LOCK FLUSH 100 UNIT/ML IV SOLN
500.0000 [IU] | Freq: Once | INTRAVENOUS | Status: AC
  Administered 2020-12-06: 500 [IU] via INTRAVENOUS
  Filled 2020-12-06: qty 5

## 2020-12-06 MED ORDER — OXYCODONE HCL ER 10 MG PO T12A
10.0000 mg | EXTENDED_RELEASE_TABLET | Freq: Two times a day (BID) | ORAL | Status: DC
Start: 2020-12-06 — End: 2020-12-06
  Administered 2020-12-06: 10 mg via ORAL
  Filled 2020-12-06: qty 1

## 2020-12-09 ENCOUNTER — Ambulatory Visit (HOSPITAL_BASED_OUTPATIENT_CLINIC_OR_DEPARTMENT_OTHER): Payer: BC Managed Care – PPO

## 2020-12-09 ENCOUNTER — Telehealth: Payer: Self-pay | Admitting: *Deleted

## 2020-12-09 ENCOUNTER — Inpatient Hospital Stay: Payer: BC Managed Care – PPO

## 2020-12-09 NOTE — Telephone Encounter (Signed)
Provided Tina Morales with PET appointment at [facility] 6/9 at 2:30/3 pm and NPO 6 hours prior. She agrees to appointment and agrees (per Dr. Benay Spice) to move her 6/8 chemo to 6/15. Scheduling message sent.

## 2020-12-11 ENCOUNTER — Other Ambulatory Visit: Payer: Self-pay | Admitting: Nurse Practitioner

## 2020-12-11 ENCOUNTER — Telehealth: Payer: Self-pay

## 2020-12-11 DIAGNOSIS — C20 Malignant neoplasm of rectum: Secondary | ICD-10-CM

## 2020-12-11 MED ORDER — HYDROCODONE-ACETAMINOPHEN 5-325 MG PO TABS: 1.0000 | ORAL_TABLET | Freq: Four times a day (QID) | ORAL | 0 refills | Status: DC | PRN

## 2020-12-11 NOTE — Telephone Encounter (Signed)
TC from Pt stating that she had an appointment with Dr. Dema Severin today but had to cancel because she was not feeling to good. Pt stated that she wants to wean herself off of the dilaudid she is taking because it makes her feel out of it. Requesting a prescription for OxyContin  Just to hold her over until she can get to see Dr Dema Severin. Discussed this with Leander Rams NP she stated she would give the Pt hydrocodone-acetaminophen in replace of the dilaudid. Pt verbalized understanding. Prescription sent to the pharmacy.

## 2020-12-15 ENCOUNTER — Other Ambulatory Visit: Payer: Self-pay | Admitting: *Deleted

## 2020-12-15 DIAGNOSIS — C20 Malignant neoplasm of rectum: Secondary | ICD-10-CM

## 2020-12-17 ENCOUNTER — Other Ambulatory Visit: Payer: BC Managed Care – PPO

## 2020-12-17 ENCOUNTER — Other Ambulatory Visit (INDEPENDENT_AMBULATORY_CARE_PROVIDER_SITE_OTHER): Payer: Self-pay | Admitting: Physician Assistant

## 2020-12-17 ENCOUNTER — Ambulatory Visit: Payer: BC Managed Care – PPO

## 2020-12-17 ENCOUNTER — Ambulatory Visit: Payer: BC Managed Care – PPO | Admitting: Nurse Practitioner

## 2020-12-17 ENCOUNTER — Telehealth: Payer: Self-pay

## 2020-12-17 ENCOUNTER — Encounter (HOSPITAL_COMMUNITY): Payer: Self-pay | Admitting: Surgery

## 2020-12-17 ENCOUNTER — Inpatient Hospital Stay (HOSPITAL_COMMUNITY): Payer: BC Managed Care – PPO

## 2020-12-17 ENCOUNTER — Inpatient Hospital Stay (HOSPITAL_COMMUNITY)
Admission: AD | Admit: 2020-12-17 | Discharge: 2020-12-23 | DRG: 394 | Disposition: A | Discharge status: Hospice | Payer: BC Managed Care – PPO | Attending: Surgery | Admitting: Surgery

## 2020-12-17 DIAGNOSIS — N824 Other female intestinal-genital tract fistulae: Secondary | ICD-10-CM | POA: Diagnosis present

## 2020-12-17 DIAGNOSIS — D638 Anemia in other chronic diseases classified elsewhere: Secondary | ICD-10-CM | POA: Diagnosis present

## 2020-12-17 DIAGNOSIS — N390 Urinary tract infection, site not specified: Secondary | ICD-10-CM | POA: Diagnosis present

## 2020-12-17 DIAGNOSIS — Z808 Family history of malignant neoplasm of other organs or systems: Secondary | ICD-10-CM | POA: Diagnosis not present

## 2020-12-17 DIAGNOSIS — R531 Weakness: Secondary | ICD-10-CM | POA: Diagnosis not present

## 2020-12-17 DIAGNOSIS — R3915 Urgency of urination: Secondary | ICD-10-CM | POA: Diagnosis present

## 2020-12-17 DIAGNOSIS — K219 Gastro-esophageal reflux disease without esophagitis: Secondary | ICD-10-CM | POA: Diagnosis present

## 2020-12-17 DIAGNOSIS — Z87891 Personal history of nicotine dependence: Secondary | ICD-10-CM | POA: Diagnosis not present

## 2020-12-17 DIAGNOSIS — Z20822 Contact with and (suspected) exposure to covid-19: Secondary | ICD-10-CM | POA: Diagnosis present

## 2020-12-17 DIAGNOSIS — Z79899 Other long term (current) drug therapy: Secondary | ICD-10-CM

## 2020-12-17 DIAGNOSIS — Z933 Colostomy status: Secondary | ICD-10-CM | POA: Diagnosis not present

## 2020-12-17 DIAGNOSIS — C7800 Secondary malignant neoplasm of unspecified lung: Secondary | ICD-10-CM | POA: Diagnosis present

## 2020-12-17 DIAGNOSIS — F419 Anxiety disorder, unspecified: Secondary | ICD-10-CM | POA: Diagnosis present

## 2020-12-17 DIAGNOSIS — Z515 Encounter for palliative care: Secondary | ICD-10-CM

## 2020-12-17 DIAGNOSIS — G893 Neoplasm related pain (acute) (chronic): Secondary | ICD-10-CM | POA: Diagnosis present

## 2020-12-17 DIAGNOSIS — Z85048 Personal history of other malignant neoplasm of rectum, rectosigmoid junction, and anus: Secondary | ICD-10-CM | POA: Diagnosis not present

## 2020-12-17 DIAGNOSIS — R52 Pain, unspecified: Secondary | ICD-10-CM | POA: Diagnosis not present

## 2020-12-17 DIAGNOSIS — C786 Secondary malignant neoplasm of retroperitoneum and peritoneum: Secondary | ICD-10-CM | POA: Diagnosis present

## 2020-12-17 DIAGNOSIS — Z801 Family history of malignant neoplasm of trachea, bronchus and lung: Secondary | ICD-10-CM

## 2020-12-17 DIAGNOSIS — R102 Pelvic and perineal pain: Secondary | ICD-10-CM | POA: Diagnosis not present

## 2020-12-17 DIAGNOSIS — Z66 Do not resuscitate: Secondary | ICD-10-CM | POA: Diagnosis not present

## 2020-12-17 DIAGNOSIS — C20 Malignant neoplasm of rectum: Secondary | ICD-10-CM | POA: Diagnosis present

## 2020-12-17 DIAGNOSIS — R180 Malignant ascites: Secondary | ICD-10-CM | POA: Diagnosis present

## 2020-12-17 DIAGNOSIS — Z7189 Other specified counseling: Secondary | ICD-10-CM

## 2020-12-17 DIAGNOSIS — I8290 Acute embolism and thrombosis of unspecified vein: Secondary | ICD-10-CM | POA: Diagnosis not present

## 2020-12-17 DIAGNOSIS — E778 Other disorders of glycoprotein metabolism: Secondary | ICD-10-CM | POA: Diagnosis present

## 2020-12-17 DIAGNOSIS — Z882 Allergy status to sulfonamides status: Secondary | ICD-10-CM | POA: Diagnosis not present

## 2020-12-17 DIAGNOSIS — I81 Portal vein thrombosis: Secondary | ICD-10-CM

## 2020-12-17 LAB — MAGNESIUM: Magnesium: 1.9 mg/dL (ref 1.7–2.4)

## 2020-12-17 LAB — COMPREHENSIVE METABOLIC PANEL
ALT: 11 U/L (ref 0–44)
AST: 34 U/L (ref 15–41)
Albumin: 2.4 g/dL — ABNORMAL LOW (ref 3.5–5.0)
Alkaline Phosphatase: 157 U/L — ABNORMAL HIGH (ref 38–126)
Anion gap: 10 (ref 5–15)
BUN: 7 mg/dL (ref 6–20)
CO2: 32 mmol/L (ref 22–32)
Calcium: 7.8 mg/dL — ABNORMAL LOW (ref 8.9–10.3)
Chloride: 89 mmol/L — ABNORMAL LOW (ref 98–111)
Creatinine, Ser: 0.44 mg/dL (ref 0.44–1.00)
GFR, Estimated: >60 mL/min (ref >60)
Glucose, Bld: 130 mg/dL — ABNORMAL HIGH (ref 70–99)
Potassium: 2.3 mmol/L — CL (ref 3.5–5.1)
Sodium: 131 mmol/L — ABNORMAL LOW (ref 135–145)
Total Bilirubin: 1 mg/dL (ref 0.3–1.2)
Total Protein: 5.5 g/dL — ABNORMAL LOW (ref 6.5–8.1)

## 2020-12-17 LAB — CBC WITH DIFFERENTIAL/PLATELET
Abs Immature Granulocytes: 0.31 10*3/uL — ABNORMAL HIGH (ref 0.00–0.07)
Basophils Absolute: 0.1 10*3/uL (ref 0.0–0.1)
Basophils Relative: 0 %
Eosinophils Absolute: 0 10*3/uL (ref 0.0–0.5)
Eosinophils Relative: 0 %
HCT: 30.2 % — ABNORMAL LOW (ref 36.0–46.0)
Hemoglobin: 9.8 g/dL — ABNORMAL LOW (ref 12.0–15.0)
Immature Granulocytes: 2 %
Lymphocytes Relative: 9 %
Lymphs Abs: 1.8 10*3/uL (ref 0.7–4.0)
MCH: 22.1 pg — ABNORMAL LOW (ref 26.0–34.0)
MCHC: 32.5 g/dL (ref 30.0–36.0)
MCV: 68 fL — ABNORMAL LOW (ref 80.0–100.0)
Monocytes Absolute: 2 10*3/uL — ABNORMAL HIGH (ref 0.1–1.0)
Monocytes Relative: 10 %
Neutro Abs: 16 10*3/uL — ABNORMAL HIGH (ref 1.7–7.7)
Neutrophils Relative %: 79 %
Platelets: 311 10*3/uL (ref 150–400)
RBC: 4.44 MIL/uL (ref 3.87–5.11)
RDW: 20.5 % — ABNORMAL HIGH (ref 11.5–15.5)
WBC: 20.2 10*3/uL — ABNORMAL HIGH (ref 4.0–10.5)
nRBC: 1.2 % — ABNORMAL HIGH (ref 0.0–0.2)

## 2020-12-17 LAB — RESP PANEL BY RT-PCR (FLU A&B, COVID) ARPGX2
Influenza A by PCR: NEGATIVE
Influenza B by PCR: NEGATIVE
SARS Coronavirus 2 by RT PCR: NEGATIVE

## 2020-12-17 LAB — PHOSPHORUS: Phosphorus: 2.9 mg/dL (ref 2.5–4.6)

## 2020-12-17 MED ORDER — ACETAMINOPHEN 650 MG RE SUPP: 650.0000 mg | Freq: Four times a day (QID) | RECTAL | Status: DC | PRN

## 2020-12-17 MED ORDER — SIMETHICONE 80 MG PO CHEW: 40.0000 mg | CHEWABLE_TABLET | Freq: Four times a day (QID) | ORAL | Status: DC | PRN

## 2020-12-17 MED ORDER — BOOST / RESOURCE BREEZE PO LIQD CUSTOM
1.0000 | Freq: Three times a day (TID) | ORAL | Status: DC
  Administered 2020-12-17 – 2020-12-23 (×15): 1 via ORAL

## 2020-12-17 MED ORDER — PANTOPRAZOLE SODIUM 40 MG IV SOLR
40.0000 mg | Freq: Every day | INTRAVENOUS | Status: DC
  Administered 2020-12-17: 40 mg via INTRAVENOUS
  Filled 2020-12-17: qty 40

## 2020-12-17 MED ORDER — ACETAMINOPHEN 325 MG PO TABS
650.0000 mg | ORAL_TABLET | Freq: Four times a day (QID) | ORAL | Status: DC | PRN
  Administered 2020-12-17 – 2020-12-23 (×4): 650 mg via ORAL
  Filled 2020-12-17 (×4): qty 2

## 2020-12-17 MED ORDER — DIPHENHYDRAMINE HCL 25 MG PO CAPS: 25.0000 mg | ORAL_CAPSULE | Freq: Four times a day (QID) | ORAL | Status: DC | PRN

## 2020-12-17 MED ORDER — OXYCODONE HCL 5 MG PO TABS
5.0000 mg | ORAL_TABLET | ORAL | Status: DC | PRN
  Administered 2020-12-17 – 2020-12-18 (×2): 5 mg via ORAL
  Filled 2020-12-17 (×2): qty 1

## 2020-12-17 MED ORDER — DIPHENHYDRAMINE HCL 50 MG/ML IJ SOLN
25.0000 mg | Freq: Four times a day (QID) | INTRAMUSCULAR | Status: DC | PRN
Start: 2020-12-17 — End: 2020-12-24

## 2020-12-17 MED ORDER — METOPROLOL TARTRATE 5 MG/5ML IV SOLN: 5.0000 mg | Freq: Four times a day (QID) | INTRAVENOUS | Status: DC | PRN

## 2020-12-17 MED ORDER — KCL IN DEXTROSE-NACL 20-5-0.9 MEQ/L-%-% IV SOLN
INTRAVENOUS | Status: DC
  Filled 2020-12-17 (×10): qty 1000

## 2020-12-17 MED ORDER — PROCHLORPERAZINE EDISYLATE 10 MG/2ML IJ SOLN
5.0000 mg | Freq: Four times a day (QID) | INTRAMUSCULAR | Status: DC | PRN
  Administered 2020-12-20 – 2020-12-23 (×3): 10 mg via INTRAVENOUS
  Filled 2020-12-17 (×3): qty 2

## 2020-12-17 MED ORDER — ONDANSETRON 4 MG PO TBDP: 4.0000 mg | ORAL_TABLET | Freq: Four times a day (QID) | ORAL | Status: DC | PRN

## 2020-12-17 MED ORDER — ENSURE ENLIVE PO LIQD
237.0000 mL | Freq: Two times a day (BID) | ORAL | Status: DC
  Administered 2020-12-19 – 2020-12-22 (×4): 237 mL via ORAL

## 2020-12-17 MED ORDER — PROCHLORPERAZINE MALEATE 10 MG PO TABS
10.0000 mg | ORAL_TABLET | Freq: Four times a day (QID) | ORAL | Status: DC | PRN
  Administered 2020-12-19 – 2020-12-22 (×2): 10 mg via ORAL
  Filled 2020-12-17 (×2): qty 1

## 2020-12-17 MED ORDER — HYDROMORPHONE HCL 1 MG/ML IJ SOLN
1.0000 mg | INTRAMUSCULAR | Status: DC | PRN
Start: 2020-12-17 — End: 2020-12-18
  Administered 2020-12-17 – 2020-12-18 (×4): 1 mg via INTRAVENOUS
  Filled 2020-12-17 (×4): qty 1

## 2020-12-17 MED ORDER — POTASSIUM CHLORIDE 10 MEQ/100ML IV SOLN
10.0000 meq | INTRAVENOUS | Status: AC
  Administered 2020-12-17 – 2020-12-18 (×3): 10 meq via INTRAVENOUS
  Filled 2020-12-17 (×3): qty 100

## 2020-12-17 MED ORDER — ONDANSETRON HCL 4 MG/2ML IJ SOLN
4.0000 mg | Freq: Four times a day (QID) | INTRAMUSCULAR | Status: DC | PRN
  Filled 2020-12-17 (×3): qty 2

## 2020-12-17 MED ORDER — POLYETHYLENE GLYCOL 3350 17 G PO PACK: 17.0000 g | PACK | Freq: Every day | ORAL | Status: DC | PRN

## 2020-12-17 NOTE — Telephone Encounter (Signed)
TC from Pt's husband stating Pt has stool coming out of her vaginal area. Pt's husband want's to know if Pt can be direct admitted to hospital. Pt's husband stated he put a call into the surgeon's office.(Dr White)Discussed with Leander Rams NP informed to give Dr Orest Dikes office a call. Call placed to Dr Orest Dikes office as well to get in touch with the Pt. Follow up call with Pt to see if Dr Orest Dikes office returned the call. Pt's husband stated they received a call from Dr Dema Severin.

## 2020-12-17 NOTE — Progress Notes (Signed)
Date and time results received: 12/17/20, 2210  Test: K+ Critical Value: 2.3  Name of Provider Notified: Dr Brantley Stage, CCS  Orders Received? Or Actions Taken?: Awaiting orders

## 2020-12-18 ENCOUNTER — Other Ambulatory Visit: Payer: Self-pay

## 2020-12-18 ENCOUNTER — Inpatient Hospital Stay (HOSPITAL_COMMUNITY): Payer: BC Managed Care – PPO

## 2020-12-18 ENCOUNTER — Ambulatory Visit (HOSPITAL_COMMUNITY): Payer: BC Managed Care – PPO

## 2020-12-18 DIAGNOSIS — I8290 Acute embolism and thrombosis of unspecified vein: Secondary | ICD-10-CM

## 2020-12-18 DIAGNOSIS — Z7189 Other specified counseling: Secondary | ICD-10-CM

## 2020-12-18 DIAGNOSIS — Z515 Encounter for palliative care: Secondary | ICD-10-CM

## 2020-12-18 DIAGNOSIS — R531 Weakness: Secondary | ICD-10-CM

## 2020-12-18 LAB — URINALYSIS, ROUTINE W REFLEX MICROSCOPIC
Bilirubin Urine: NEGATIVE
Glucose, UA: NEGATIVE mg/dL
Ketones, ur: NEGATIVE mg/dL
Nitrite: NEGATIVE
Protein, ur: 30 mg/dL — AB
Specific Gravity, Urine: 1.008 (ref 1.005–1.030)
pH: 6 (ref 5.0–8.0)

## 2020-12-18 LAB — C DIFFICILE QUICK SCREEN W PCR REFLEX
C Diff antigen: NEGATIVE
C Diff interpretation: NEGATIVE
C Diff toxin: NEGATIVE

## 2020-12-18 LAB — POTASSIUM: Potassium: 2.8 mmol/L — ABNORMAL LOW (ref 3.5–5.1)

## 2020-12-18 MED ORDER — OXYCODONE HCL ER 10 MG PO T12A
10.0000 mg | EXTENDED_RELEASE_TABLET | Freq: Two times a day (BID) | ORAL | Status: DC
Start: 2020-12-18 — End: 2020-12-22
  Administered 2020-12-18 – 2020-12-21 (×8): 10 mg via ORAL
  Filled 2020-12-18 (×9): qty 1

## 2020-12-18 MED ORDER — NALOXONE HCL 0.4 MG/ML IJ SOLN: 0.4000 mg | INTRAMUSCULAR | Status: DC | PRN

## 2020-12-18 MED ORDER — DIPHENHYDRAMINE HCL 50 MG/ML IJ SOLN: 12.5000 mg | Freq: Four times a day (QID) | INTRAMUSCULAR | Status: DC | PRN

## 2020-12-18 MED ORDER — OXYCODONE HCL 5 MG PO TABS
5.0000 mg | ORAL_TABLET | ORAL | Status: DC | PRN
  Administered 2020-12-18: 10 mg via ORAL
  Filled 2020-12-18: qty 2

## 2020-12-18 MED ORDER — PANTOPRAZOLE SODIUM 40 MG PO TBEC
40.0000 mg | DELAYED_RELEASE_TABLET | Freq: Every day | ORAL | Status: DC
  Administered 2020-12-18 – 2020-12-22 (×5): 40 mg via ORAL
  Filled 2020-12-18 (×5): qty 1

## 2020-12-18 MED ORDER — HYDROMORPHONE HCL 1 MG/ML IJ SOLN
1.0000 mg | INTRAMUSCULAR | Status: DC | PRN
  Administered 2020-12-18 (×2): 2 mg via INTRAVENOUS
  Administered 2020-12-18: 1 mg via INTRAVENOUS
  Filled 2020-12-18 (×2): qty 2
  Filled 2020-12-18: qty 1

## 2020-12-18 MED ORDER — SODIUM CHLORIDE 0.9 % IV SOLN
1.0000 g | INTRAVENOUS | Status: DC
  Administered 2020-12-18 – 2020-12-23 (×6): 1 g via INTRAVENOUS
  Filled 2020-12-18: qty 1
  Filled 2020-12-18: qty 10
  Filled 2020-12-18 (×4): qty 1

## 2020-12-18 MED ORDER — CALCIUM POLYCARBOPHIL 625 MG PO TABS
625.0000 mg | ORAL_TABLET | Freq: Every day | ORAL | Status: DC
  Administered 2020-12-18 – 2020-12-23 (×6): 625 mg via ORAL
  Filled 2020-12-18 (×6): qty 1

## 2020-12-18 MED ORDER — CHLORHEXIDINE GLUCONATE CLOTH 2 % EX PADS
6.0000 | MEDICATED_PAD | Freq: Every day | CUTANEOUS | Status: DC
  Administered 2020-12-18 – 2020-12-23 (×6): 6 via TOPICAL

## 2020-12-18 MED ORDER — ALPRAZOLAM 1 MG PO TABS
1.0000 mg | ORAL_TABLET | Freq: Two times a day (BID) | ORAL | Status: DC | PRN
  Administered 2020-12-19 – 2020-12-23 (×4): 1 mg via ORAL
  Filled 2020-12-18: qty 2
  Filled 2020-12-18: qty 1
  Filled 2020-12-18 (×2): qty 2

## 2020-12-18 MED ORDER — HYDROMORPHONE 1 MG/ML IV SOLN
INTRAVENOUS | Status: DC
  Administered 2020-12-18: 1 mg via INTRAVENOUS
  Administered 2020-12-18: 30 mg via INTRAVENOUS
  Administered 2020-12-19: 5 mg via INTRAVENOUS
  Administered 2020-12-19: 3 mg via INTRAVENOUS
  Administered 2020-12-19: 3.5 mg via INTRAVENOUS
  Administered 2020-12-19: 3 mg via INTRAVENOUS
  Administered 2020-12-19: 3.5 mg via INTRAVENOUS
  Administered 2020-12-19: 2 mg via INTRAVENOUS
  Administered 2020-12-20: 3.5 mg via INTRAVENOUS
  Administered 2020-12-20: 1.5 mg via INTRAVENOUS
  Administered 2020-12-20: 0.5 mg via INTRAVENOUS
  Administered 2020-12-20: 1 mg via INTRAVENOUS
  Administered 2020-12-20: 1.5 mg via INTRAVENOUS
  Administered 2020-12-20: 3 mg via INTRAVENOUS
  Administered 2020-12-20: 30 mg via INTRAVENOUS
  Administered 2020-12-21: 1 mg via INTRAVENOUS
  Administered 2020-12-21: 3 mg via INTRAVENOUS
  Administered 2020-12-21: 2 mg via INTRAVENOUS
  Administered 2020-12-21: 2.5 mg via INTRAVENOUS
  Administered 2020-12-21: 4 mg via INTRAVENOUS
  Administered 2020-12-21: 3.5 mg via INTRAVENOUS
  Administered 2020-12-22: 2.5 mg via INTRAVENOUS
  Administered 2020-12-22: 30 mg via INTRAVENOUS
  Filled 2020-12-18 (×3): qty 30

## 2020-12-18 MED ORDER — SODIUM CHLORIDE 0.9% FLUSH: 9.0000 mL | INTRAVENOUS | Status: DC | PRN

## 2020-12-18 MED ORDER — DIPHENHYDRAMINE HCL 12.5 MG/5ML PO ELIX: 12.5000 mg | ORAL_SOLUTION | Freq: Four times a day (QID) | ORAL | Status: DC | PRN

## 2020-12-18 MED ORDER — POTASSIUM CHLORIDE CRYS ER 20 MEQ PO TBCR
20.0000 meq | EXTENDED_RELEASE_TABLET | Freq: Two times a day (BID) | ORAL | Status: DC
  Administered 2020-12-18 – 2020-12-22 (×9): 20 meq via ORAL
  Filled 2020-12-18 (×10): qty 1

## 2020-12-18 MED ORDER — ONDANSETRON HCL 4 MG/2ML IJ SOLN
4.0000 mg | Freq: Four times a day (QID) | INTRAMUSCULAR | Status: DC | PRN
  Administered 2020-12-19 – 2020-12-21 (×4): 4 mg via INTRAVENOUS
  Filled 2020-12-18 (×2): qty 2

## 2020-12-18 MED ORDER — ALPRAZOLAM 1 MG PO TABS
1.0000 mg | ORAL_TABLET | Freq: Every day | ORAL | Status: DC
  Administered 2020-12-18: 1 mg via ORAL
  Filled 2020-12-18: qty 2

## 2020-12-18 MED ORDER — POTASSIUM CHLORIDE 20 MEQ PO PACK
80.0000 meq | PACK | Freq: Once | ORAL | Status: AC
  Administered 2020-12-18: 80 meq via ORAL
  Filled 2020-12-18: qty 4

## 2020-12-18 NOTE — Progress Notes (Signed)
   12/18/20 1516  Assess: MEWS Score  Temp 97.7 F (36.5 C)  BP 108/84  Pulse Rate (!) 116  Resp 16  SpO2 94 %  Assess: MEWS Score  MEWS Temp 0  MEWS Systolic 0  MEWS Pulse 2  MEWS RR 0  MEWS LOC 0  MEWS Score 2  MEWS Score Color Yellow  Assess: if the MEWS score is Yellow or Red  Were vital signs taken at a resting state? Yes  Focused Assessment Change from prior assessment (see assessment flowsheet)  Does the patient meet 2 or more of the SIRS criteria? No  Does the patient have a confirmed or suspected source of infection? No  MEWS guidelines implemented *See Row Information* Yes  Treat  MEWS Interventions Administered prn meds/treatments  Pain Scale 0-10  Pain Score 9  Pain Type Acute pain  Pain Location Vagina  Pain Orientation Lower  Pain Descriptors / Indicators Burning  Pain Frequency Constant  Pain Onset On-going  Patients Stated Pain Goal 3  Pain Intervention(s) Medication (See eMAR)  Notify: Charge Nurse/RN  Name of Charge Nurse/RN Notified Kim RN  Date Charge Nurse/RN Notified 12/18/20  Time Charge Nurse/RN Notified 1520  Notify: Provider  Provider Name/Title Dr Windle Guard  Date Provider Notified 12/18/20  Time Provider Notified 1641  Notification Type Page  Notification Reason Other (Comment) (pt in pain and urine results)  Provider response See new orders  Date of Provider Response 12/18/20  Time of Provider Response 1644  Assess: SIRS CRITERIA  SIRS Temperature  0  SIRS Pulse 1  SIRS Respirations  0  SIRS WBC 0  SIRS Score Sum  1

## 2020-12-18 NOTE — H&P (Signed)
Surgical Evaluation  Chief Complaint: enterovaginal fistula  HPI: 55yo woman known to our service with history of rectal cancer with prior APR with posterior vaginectomy 08/2018 for locally advanced rectal cancer (following neoadjuvant cXRT), ultimately with enlarging lung nodules and found to have pulmonary metastases. She has been primairly following as an outpatient with medical oncology and has been maintained on systemic therapy including Avastin. Last dose was end of April . She was admitted twice in May with obstructive symptoms, went home 5/27. Reports she has been doing fine with decent ostomy function, has not needed to catheterize her stoma in about 4 days, but yesterday began having severe vaginal pain followed by passage of stool from the vagina.     Allergies  Allergen Reactions   Sulfa Antibiotics     Patient unaware of side effects, she was told when she was a child that she was allergic    Past Medical History:  Diagnosis Date   Adenocarcinoma ([place]) 02/2018   Anxiety    Cancer ([place]) 02/10/2018   Stage 4 Colorectal Cancer, spot on her lungs   Migraine     Past Surgical History:  Procedure Laterality Date   ADENOIDECTOMY     CYSTOSCOPY W/ RETROGRADES Bilateral 09/01/2018   Procedure: CYSTOSCOPY WITH BILATERAL  RETROGRADE PYELOGRAM;  Surgeon: Alexis Frock, MD;  Location: [facility];  Service: Urology;  Laterality: Bilateral;   CYSTOSCOPY WITH STENT PLACEMENT Bilateral 09/01/2018   Procedure: CYSTOSCOPY WITH URETERAL COOK CATHETER PLACEMENT;  Surgeon: Alexis Frock, MD;  Location: [facility];  Service: Urology;  Laterality: Bilateral;   IR CV LINE INJECTION  06/09/2020   ROBOTIC ASSISTED TOTAL HYSTERECTOMY WITH BILATERAL SALPINGO OOPHERECTOMY Bilateral 09/01/2018   Procedure: XI ROBOTIC ASSISTED PARTIAL VAGINECTOMY;  Surgeon: Everitt Amber, MD;  Location: [facility];  Service: Gynecology;  Laterality: Bilateral;   TONSILLECTOMY      Family History  Problem Relation Age of Onset    Breast cancer Maternal Aunt    Throat cancer Cousin    Cancer Cousin    Throat cancer Cousin    Lung cancer Paternal Uncle     Social History   Socioeconomic History   Marital status: Married    Spouse name: Not on file   Number of children: Not on file   Years of education: Not on file   Highest education level: Not on file  Occupational History   Not on file  Tobacco Use   Smoking status: Former    Years: 30.00    Pack years: 0.00    Types: Cigarettes    Quit date: 02/2018    Years since quitting: 2.8   Smokeless tobacco: Never  Vaping Use   Vaping Use: Never used  Substance and Sexual Activity   Alcohol use: Not Currently    Comment: per Dr Dema Severin chart, "no drink in the last 5 years"   Drug use: Never   Sexual activity: Not on file  Other Topics Concern   Not on file  Social History Narrative   Not on file   Social Determinants of Health   Financial Resource Strain: Not on file  Food Insecurity: Not on file  Transportation Needs: Not on file  Physical Activity: Not on file  Stress: Not on file  Social Connections: Not on file    No current facility-administered medications on file prior to encounter.   Current Outpatient Medications on File Prior to Encounter  Medication Sig Dispense Refill   acetaminophen (TYLENOL) 500 MG tablet Take 1,000  mg by mouth every 8 (eight) hours as needed (PAIN).      ALPRAZolam (XANAX) 0.5 MG tablet Take 1 tablet (0.5 mg total) by mouth 2 (two) times daily as needed for anxiety. (Patient taking differently: Take 1 mg by mouth at bedtime.) 60 tablet 5   bisacodyl (DULCOLAX) 5 MG EC tablet Take 10 mg by mouth daily as needed for moderate constipation. Takes for few days after each tx     dexamethasone (DECADRON) 4 MG tablet #2 tablets twice daily x 3 days. Start day after chemo treatment 30 tablet 0   dicyclomine (BENTYL) 10 MG capsule Take 1 capsule (10 mg total) by mouth 3 (three) times daily before meals. 30 capsule 0    diphenoxylate-atropine (LOMOTIL) 2.5-0.025 MG tablet Take 2 tablets by mouth 4 (four) times daily as needed for diarrhea or loose stools. 30 tablet 1   docusate sodium (COLACE) 100 MG capsule Take 1 capsule (100 mg total) by mouth 2 (two) times daily. 30 capsule 0   HYDROcodone-acetaminophen (NORCO) 5-325 MG tablet Take 1-2 tablets by mouth every 6 (six) hours as needed for moderate pain. 30 tablet 0   lidocaine-prilocaine (EMLA) cream Apply 1 application topically as needed (port). 30 g 1   loratadine (CLARITIN) 10 MG tablet Take 10 mg by mouth daily as needed for allergies.     Magnesium Oxide 250 MG TABS Take 500 mg by mouth 2 (two) times daily.     omeprazole (PRILOSEC) 20 MG capsule Take 20 mg by mouth at bedtime.     polyethylene glycol (MIRALAX) 17 g packet Take 17 g by mouth 2 (two) times daily.  0   potassium chloride SA (KLOR-CON) 20 MEQ tablet Take 1 tablet (20 mEq total) by mouth 2 (two) times daily. 60 tablet 1   prochlorperazine (COMPAZINE) 10 MG tablet Take 1 tablet (10 mg total) by mouth every 6 (six) hours as needed for nausea or vomiting. 60 tablet 1    Review of Systems: a complete, 10pt review of systems was completed with pertinent positives and negatives as documented in the HPI  Physical Exam: Vitals:   12/17/20 2334 12/18/20 0351  BP: 109/81 101/72  Pulse: (!) 103 (!) 101  Resp: 18 18  Temp: 97.8 F (36.6 C) 97.6 F (36.4 C)  SpO2: 97% 98%   Gen: A&Ox3, no distress  Eyes: lids and conjunctivae normal, no icterus. Pupils equally round and reactive to light.  Neck: supple without mass or thyromegaly Chest: respiratory effort is normal. No crepitus or tenderness on palpation of the chest. Breath sounds equal.  Cardiovascular: RRR with palpable distal pulses, no pedal edema Gastrointestinal: soft, distended, left upper quadrant stoma is pink and budded.  Diffuse mild abdominal tenderness. GU: No cellulitis or evidence of infection in the perineal region Lymphatic:  no lymphadenopathy in the neck or groin Muscoloskeletal: no clubbing or cyanosis of the fingers.  Strength is symmetrical throughout.  Range of motion of bilateral upper and lower extremities normal without pain, crepitation or contracture. Neuro: cranial nerves grossly intact.  Sensation intact to light touch diffusely. Psych: appropriate mood and affect, normal insight/judgment intact  Skin: warm and dry   CBC Latest Ref Rng & Units 12/17/2020 12/05/2020 12/04/2020  WBC 4.0 - 10.5 K/uL 20.2(H) 12.6(H) 14.4(H)  Hemoglobin 12.0 - 15.0 g/dL 9.8(L) 9.6(L) 9.4(L)  Hematocrit 36.0 - 46.0 % 30.2(L) 30.5(L) 30.1(L)  Platelets 150 - 400 K/uL 311 295 285    CMP Latest Ref Rng & Units 12/17/2020  12/05/2020 12/04/2020  Glucose 70 - 99 mg/dL 130(H) 103(H) 99  BUN 6 - 20 mg/dL 7 5(L) 6  Creatinine 0.44 - 1.00 mg/dL 0.44 0.34(L) <0.30(L)  Sodium 135 - 145 mmol/L 131(L) 132(L) 132(L)  Potassium 3.5 - 5.1 mmol/L 2.3(LL) 2.8(L) 3.3(L)  Chloride 98 - 111 mmol/L 89(L) 98 93(L)  CO2 22 - 32 mmol/L 32 28 30  Calcium 8.9 - 10.3 mg/dL 7.8(L) 8.1(L) 8.2(L)  Total Protein 6.5 - 8.1 g/dL 5.5(L) - 5.0(L)  Total Bilirubin 0.3 - 1.2 mg/dL 1.0 - 1.0  Alkaline Phos 38 - 126 U/L 157(H) - 179(H)  AST 15 - 41 U/L 34 - 24  ALT 0 - 44 U/L 11 - 11    Lab Results  Component Value Date   INR 1.08 08/30/2018    Imaging: CT ABDOMEN PELVIS WO CONTRAST  Result Date: 12/18/2020 CLINICAL DATA:  Feculent vaginal discharge, leukocytosis EXAM: CT ABDOMEN AND PELVIS WITHOUT CONTRAST TECHNIQUE: Multidetector CT imaging of the abdomen and pelvis was performed following the standard protocol without IV contrast. COMPARISON:  12/03/2020 FINDINGS: Lower chest: Multiple pulmonary nodules are again identified within the visualized lung bases, stable since prior examination, several of which demonstrate cavitation and are compatible with bilateral pulmonary metastases. Moderate coronary artery calcification. Trace pericardial effusion is  stable. Cardiac size within normal limits. Hepatobiliary: No focal liver abnormality is seen. No gallstones, gallbladder wall thickening, or biliary dilatation. Pancreas: Unremarkable Spleen: Unremarkable Adrenals/Urinary Tract: The adrenal glands are unremarkable. The kidneys are normal in size and position. No intrarenal or ureteral calculi are seen. There is no hydronephrosis. The bladder is markedly distended, progressive since prior examination, suggesting changes of possible bladder outlet obstruction. Stomach/Bowel: Extensive peritoneal thickening and nodularity is again identified, best appreciated within the right upper quadrant and right mid abdomen, as well as progressive nodular omental thickening in this region in keeping with peritoneal carcinomatosis. There is moderate ascites again identified., slightly increased in volume since prior examination. Surgical changes of abdominal peroneal resection and left lower quadrant descending, and colostomy are identified. There has developed marked circumferential bowel wall thickening and pericolonic inflammatory stranding in keeping with changes of a diffuse infectious or inflammatory colitis such as pseudomembranous colitis. No pneumatosis or mesenteric or portal venous gas identified. No evidence of obstruction. There is, additionally, diffuse mucosal fold thickening involving multiple loops of small bowel as well as the a duodenum. The findings can be seen in setting of hypoproteinemia with resultant bowel edema, or venous obstruction with resultant venous congestion. A infectious or inflammatory enteritis is possible, but considered less likely given the extent and uniformity of the abnormality. These findings could, altogether, also be seen in the setting of intraperitoneal chemotherapy administration if there has been a history of such intervention. Enteric contrast is seen extending into multiple loops within the pelvis. There is a leak identified  involving a single loop of small bowel posterior to the a vaginal cuff, best seen on axial image # 75 with extension of enteric contrast into the presacral space, and ultimately communicating with the vagina, best seen on axial image # 83. The cyst is seen altogether on sagittal image # 49. No evidence of obstruction.  No free intraperitoneal gas. Vascular/Lymphatic: There is moderate aortoiliac atherosclerotic calcification. No aortic aneurysm. No pathologic adenopathy within the abdomen and pelvis. Reproductive: Uterus absent.  No adnexal masses. Other: Mild developing subcutaneous edema within the flanks bilaterally in keeping with mild anasarca. Musculoskeletal: No acute bone abnormality. No lytic or blastic  bone lesions. IMPRESSION: Interval development of marked pancolonic bowel wall thickening and mild pericolonic inflammatory stranding most suggestive of an infectious or inflammatory colitis such as pseudomembranous colitis. Superimposed long segment mucosal fold thickening involving the duodenum and proximal and mid small bowel. The stented disease favors a more diffuse process, as can be seen with hypoproteinemia and resultant bowel edema or, more likely, venous obstruction and resultant venous congestion of the bowel. Contrast enhanced examination or abdominal Doppler sonography with particular attention to the portal vein may be helpful to assess patency of the portal mesenteric system. Alternatively, these findings could be seen in the setting of intraperitoneal chemotherapy administration and correlation with the patient's clinical history would be helpful. 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 Progressive peritoneal thickening and peritoneal and omental nodularity in keeping with peritoneal carcinomatosis. Mild interval increase in ascites. Bibasilar pulmonary nodules in keeping with pulmonary metastatic disease.  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 Dema Severin) and radical vaginectomy (Dr. Denman George) 09/01/2018  -CT demonstrates pancolonic bowel wall thickening and mild pericolonic inflammatory stranding concerning for infectious or inflammatory colitis such as pseudomembranous colitis.  Long segment mucosal fold thickening in the duodenum and proximal and mid small bowel, all of this is likely related to hypoproteinemia but possibly venous obstruction and venous congestion. Will order c diff and gi panel (seems less likely) and Doppler sonography with attention to the portal vein however in the past this has not been available at the [facility].  Enterovaginal fistula -we will start with a fiber supplement to thicken up her small bowel contents and hopefully decrease the output.  She appears to have progressive peritoneal carcinomatosis with increasing ascites.  We will ask oncology to weigh in regarding any further palliative chemotherapy that may be available to her, and we will consult palliative care to discuss goals of care and potentially consideration of hospice.   I discussed the CT findings and plan of care for today with the patient and her husband at bedside   ID - none VTE - lovenox FEN - full liquid diet Foley - none    Patient Active Problem List   Diagnosis Date Noted   Vaginal pain 12/17/2020   Cancer associated pain    Palliative care by specialist    Abdominal pain 12/02/2020   Diarrhea 11/26/2020   Colorectal cancer ([place]) 11/18/2020   Hypokalemia 11/18/2020   Elevated blood pressure reading without diagnosis of hypertension 11/18/2020   Dehiscence of perineal wound 10/04/2018   Wound infection 10/04/2018   Port-A-Cath in place 07/07/2018    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  See Shea Evans to contact appropriate on-call provider

## 2020-12-18 NOTE — Progress Notes (Signed)
Mesenteric study has been completed.  Spoke with Dr. Kae Heller regarding need for proper patient prep and dedicated liver ultrasound to better evaluate portal vein.  Results can be found under chart review under CV PROC. 12/18/2020 11:22 AM Darrah Dredge RVT, RDMS

## 2020-12-18 NOTE — Consult Note (Signed)
[place] Telephone:(336) [phone]   Fax:([phone]) [place] NOTE  Patient Care Team: Ladell Pier, MD as PCP - General (Oncology) [name], MD as Consulting Physician (General Surgery) Everitt Amber, MD as Consulting Physician (Gynecologic Oncology) Ladell Pier, MD as Consulting Physician (Oncology)  Hematological/Oncological History #  Rectal cancer-rectal mass noted on digital exam 02/10/2018, colonoscopy confirmed a him my circumferential mass in the rectum Biopsy 02/10/2018- tubular adenoma with at least high-grade dysplasia but no definitive evidence of invasion, pathology review at digestive health specialist- intramucosal adenocarcinoma (at least), arising in high-grade dysplasia, no loss of mismatch repair protein expression CTs 02/10/2018- anterior rectal wall thickening, pulmonary nodules measuring up to 9 mm concerning for metastases, few round perirectal lymph nodes Pelvic MRI 0/27/2536- hypermetabolic low rectal mass extending to the posterior vagina with 2 small enlarged perirectal lymph nodes, T4b,N1- 2.3 cm from the anal verge PET scan 02/23/2018-hypermetabolic rectal mass, 8 mm hypermetabolic lingular nodule, scattered small bilateral lung nodules, some calcified, a few with mildly increased activity Cycle 1 FOLFOXIRI 03/01/2018 Cycle 5 FOLFOXIRI 04/26/2018 CTs 05/01/2018-significant interval response to therapy with decreased size of the primary rectal mass lesion.  Decreasing perirectal lymphadenopathy.  Decreased and/or resolved pulmonary nodules.  No new sites of disease identified. Cycle 6 FOLFOXIRI 05/11/2018 Radiation/Xeloda 06/12/2018-completed 07/21/2018 Xeloda dose reduced 07/10/2018 due to mucositis and diarrhea CTs 08/07/2018- compared to 02/10/2018- resolved and decreased pulmonary nodules, few tiny residual noncalcified nodules, decreased 20 perirectal lymph nodes, soft tissue of the rectum indistinguishable from posterior  wall of vagina-  Vaginal involvement by tumor? APR/vaginectomy 09/01/2018-ypT4,ypNo, negative resection margins, involvement of the vagina per review of slides at GI tumor conference, no treatment effect-tumor regression score 3, no loss of mismatch repair protein expression, MSI-stable, KRAS G12V K-ras G12V mutation CT chest 09/29/2018- enlargement of lung nodules, not a candidate for SBRT based on discussion in GI tumor conference and with radiation oncology CT chest 11/27/18 - interval growth of numerous pulmonary metastases bilaterally compared to 09/29/18, no new pulmonary mets  Cycle 1 FOLFOXIRI 11/29/18  Cycle 2 FOLFOXIRI 12/14/2018 Cycle 3 FOLFOXIRI 12/28/2018 Cycle 4 FOLFIRINOX 01/11/2019 CTs 01/24/2019-significant improvement of bilateral pulmonary metastases. Cycle 1 FOLFIRI 02/01/2019 Cycle 2 FOLFIRI 02/22/2019 Cycle 3 FOLFIRI 03/15/2019 Cycle 4 FOLFIRI 04/05/2019 Cycle 5 FOLFIRI 04/26/2019 CTs 05/14/2019-slight enlargement of right lung nodules, no other evidence of disease progression Cycle 6 FOLFIRI 05/17/2019 Cycle 7 FOLFIRI plus Avastin 06/13/2019 Cycle 8 FOLFIRI plus Avastin 07/09/2019 Cycle 9 FOLFIRI plus Avastin 07/25/2019 Cycle 10 FOLFIRI plus Avastin 08/09/2019 Cycle 11 FOLFIRI plus Avastin 08/22/2019 CTs 09/07/2019-most pulmonary nodules are stable.  1 nodule right lower lobe slightly larger.  No new lung lesions. Cycle 12 FOLFIRI plus Avastin 09/13/2019 Cycle 13 FOLFIRI plus Avastin 10/04/2019 Cycle 14 FOLFIRI plus Avastin 10/25/2019 Cycle 15 FOLFIRI plus Avastin 11/14/2019 Cycle 16 FOLFIRI plus Avastin 12/06/2019 CTs neck, chest, abdomen, pelvis 12/21/2019-neck negative; mildly progressive pulmonary metastases; abdomen and pelvis negative for evidence of metastatic disease. Cycle 1 FOLFOX/Avastin 12/27/2019 Cycle 2 FOLFOX/Avastin 01/17/2020 Cycle 3 FOLFOX/Avastin 02/07/2020 Cycle 4 FOLFOX 02/28/2020, Avastin held due to need for dental evaluation possible extractions Cycle 5 FOLFOX 03/20/2020,  Avastin held pending dental evaluation CTs 04/04/2020-increased size of pulmonary nodules, no evidence of metastatic disease in the abdomen or pelvis Cycle 6 FOLFOX 05/22/2020, Avastin held secondary to dental extractions Cycle 7 FOLFOX/Avastin 06/12/2020 Cycle 8 FOLFOX/Avastin 06/30/2020 Cycle 9 FOLFOX/Avastin 07/24/2020 Cycle 10 FOLFOX/Avastin 08/14/2020 CTs 09/01/2020-improvement in bilateral pulmonary metastases. Cycle 11 FOLFOX/Avastin 09/04/2020  Cycle 12 FOLFOX/Avastin 09/25/2020 Cycle 13 FOLFOX/Avastin 10/15/2020 Cycle 14 FOLFOX/Avastin 11/05/2020 CTs 11/18/2020- stable lung nodules, no focal hepatic lesion, small amount of new pericholecystic fluid, mild submucosal edema and small bowel loops within the pelvis, fluid stool throughout the colon with submucosal edema and enhancement in the distal colon, new nodular enhancement at the posterior margin of the right liver  CHIEF COMPLAINTS/PURPOSE OF CONSULTATION:  " Non-operative candidate for enterovaginal fistula, progression of disease "  HISTORY OF PRESENTING ILLNESS:  Tina Morales 55 y.o. female with medical history significant for metastatic rectal cancer who presents with passage of stool from her vagina and worsening pain.   On review of the previous records Tina Morales follows closely with Dr. Benay Spice in our practice.  She has a history of rectal cancer status post APR with posterior vaginectomy on 08/2018 for locally advanced rectal cancer following neoadjuvant chemoradiation.  Unfortunately she eventually developed enlarging lung nodules and was found to have pulmonary metastases.  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  Unfortunately on 12/17/2020 she began having severe vaginal pain and passage of stool from her vagina.  On exam today Tina Morales appears uncomfortable. She is using her PCA and does have some trouble keeping her eyes open for conversation. She notes she is tired as she was  unable to sleep last night due to the pain. She is not having any fevers, chills, or sweats.   MEDICAL HISTORY:  Past Medical History:  Diagnosis Date   Adenocarcinoma ([place]) 02/2018   Anxiety    Cancer ([place]) 02/10/2018   Stage 4 Colorectal Cancer, spot on her lungs   Migraine     SURGICAL HISTORY: Past Surgical History:  Procedure Laterality Date   ADENOIDECTOMY     CYSTOSCOPY W/ RETROGRADES Bilateral 09/01/2018   Procedure: CYSTOSCOPY WITH BILATERAL  RETROGRADE PYELOGRAM;  Surgeon: Alexis Frock, MD;  Location: [facility];  Service: Urology;  Laterality: Bilateral;   CYSTOSCOPY WITH STENT PLACEMENT Bilateral 09/01/2018   Procedure: CYSTOSCOPY WITH URETERAL COOK CATHETER PLACEMENT;  Surgeon: Alexis Frock, MD;  Location: [facility];  Service: Urology;  Laterality: Bilateral;   IR CV LINE INJECTION  06/09/2020   ROBOTIC ASSISTED TOTAL HYSTERECTOMY WITH BILATERAL SALPINGO OOPHERECTOMY Bilateral 09/01/2018   Procedure: XI ROBOTIC ASSISTED PARTIAL VAGINECTOMY;  Surgeon: Everitt Amber, MD;  Location: [facility];  Service: Gynecology;  Laterality: Bilateral;   TONSILLECTOMY      SOCIAL HISTORY: Social History   Socioeconomic History   Marital status: Married    Spouse name: Not on file   Number of children: Not on file   Years of education: Not on file   Highest education level: Not on file  Occupational History   Not on file  Tobacco Use   Smoking status: Former    Years: 30.00    Pack years: 0.00    Types: Cigarettes    Quit date: 02/2018    Years since quitting: 2.8   Smokeless tobacco: Never  Vaping Use   Vaping Use: Never used  Substance and Sexual Activity   Alcohol use: Not Currently    Comment: per Dr Dema Severin chart, "no drink in the last 5 years"   Drug use: Never   Sexual activity: Not on file  Other Topics Concern   Not on file  Social History Narrative   Not on file   Social Determinants of Health   Financial Resource Strain: Not on file  Food Insecurity: Not on file   Transportation Needs:  Not on file  Physical Activity: Not on file  Stress: Not on file  Social Connections: Not on file  Intimate Partner Violence: Not on file    FAMILY HISTORY: Family History  Problem Relation Age of Onset   Breast cancer Maternal Aunt    Throat cancer Cousin    Cancer Cousin    Throat cancer Cousin    Lung cancer Paternal Uncle     ALLERGIES:  is allergic to sulfa antibiotics.  MEDICATIONS:  Current Facility-Administered Medications  Medication Dose Route Frequency Provider Last Rate Last Admin   acetaminophen (TYLENOL) tablet 650 mg  650 mg Oral Q6H PRN Meuth, Brooke A, PA-C   650 mg at 12/18/20 0424   Or   acetaminophen (TYLENOL) suppository 650 mg  650 mg Rectal Q6H PRN Meuth, Brooke A, PA-C       ALPRAZolam Duanne Moron) tablet 1 mg  1 mg Oral BID PRN Romana Juniper A, MD       cefTRIAXone (ROCEPHIN) 1 g in sodium chloride 0.9 % 100 mL IVPB  1 g Intravenous Q24H Romana Juniper A, MD       Chlorhexidine Gluconate Cloth 2 % PADS 6 each  6 each Topical Daily Ladell Pier, MD   6 each at 12/18/20 0844   dextrose 5 % and 0.9 % NaCl with KCl 20 mEq/L infusion   Intravenous Continuous Cornett, Marcello Moores, MD 75 mL/hr at 12/18/20 1156 Started During Downtime at 12/18/20 1156   diphenhydrAMINE (BENADRYL) injection 12.5 mg  12.5 mg Intravenous Q6H PRN Loistine Chance, MD       Or   diphenhydrAMINE (BENADRYL) 12.5 MG/5ML elixir 12.5 mg  12.5 mg Oral Q6H PRN Loistine Chance, MD       diphenhydrAMINE (BENADRYL) capsule 25 mg  25 mg Oral Q6H PRN Meuth, Brooke A, PA-C       Or   diphenhydrAMINE (BENADRYL) injection 25 mg  25 mg Intravenous Q6H PRN Meuth, Brooke A, PA-C       feeding supplement (BOOST / RESOURCE BREEZE) liquid 1 Container  1 Container Oral TID BM Meuth, Brooke A, PA-C   1 Container at 12/18/20 1427   feeding supplement (ENSURE ENLIVE / ENSURE PLUS) liquid 237 mL  237 mL Oral BID BM Meuth, Brooke A, PA-C       HYDROmorphone (DILAUDID) 1 mg/mL PCA injection    Intravenous Q4H Loistine Chance, MD   30 mg at 12/18/20 1751   metoprolol tartrate (LOPRESSOR) injection 5 mg  5 mg Intravenous Q6H PRN Meuth, Brooke A, PA-C       naloxone (NARCAN) injection 0.4 mg  0.4 mg Intravenous PRN Loistine Chance, MD       And   sodium chloride flush (NS) 0.9 % injection 9 mL  9 mL Intravenous PRN Loistine Chance, MD       ondansetron (ZOFRAN-ODT) disintegrating tablet 4 mg  4 mg Oral Q6H PRN Meuth, Brooke A, PA-C       Or   ondansetron (ZOFRAN) injection 4 mg  4 mg Intravenous Q6H PRN Meuth, Brooke A, PA-C       ondansetron (ZOFRAN) injection 4 mg  4 mg Intravenous Q6H PRN Loistine Chance, MD       oxyCODONE (OXYCONTIN) 12 hr tablet 10 mg  10 mg Oral Q12H Romana Juniper A, MD   10 mg at 12/18/20 0956   pantoprazole (PROTONIX) EC tablet 40 mg  40 mg Oral QHS Arlyn Dunning M, RPH       polycarbophil (  FIBERCON) tablet 625 mg  625 mg Oral Daily Romana Juniper A, MD   625 mg at 12/18/20 1152   potassium chloride SA (KLOR-CON) CR tablet 20 mEq  20 mEq Oral BID Romana Juniper A, MD   20 mEq at 12/18/20 2951   prochlorperazine (COMPAZINE) tablet 10 mg  10 mg Oral Q6H PRN Meuth, Brooke A, PA-C       Or   prochlorperazine (COMPAZINE) injection 5-10 mg  5-10 mg Intravenous Q6H PRN Meuth, Brooke A, PA-C       simethicone (MYLICON) chewable tablet 40 mg  40 mg Oral Q6H PRN Meuth, Brooke A, PA-C        REVIEW OF SYSTEMS:   Constitutional: ( - ) fevers, ( - )  chills , ( - ) night sweats Eyes: ( - ) blurriness of vision, ( - ) double vision, ( - ) watery eyes Ears, nose, mouth, throat, and face: ( - ) mucositis, ( - ) sore throat Respiratory: ( - ) cough, ( - ) dyspnea, ( - ) wheezes Cardiovascular: ( - ) palpitation, ( - ) chest discomfort, ( - ) lower extremity swelling Gastrointestinal:  ( - ) nausea, ( - ) heartburn, ( - ) change in bowel habits Skin: ( - ) abnormal skin rashes Lymphatics: ( - ) new lymphadenopathy, ( - ) easy bruising Neurological: ( - ) numbness, ( - ) tingling, ( -  ) new weaknesses Behavioral/Psych: ( - ) mood change, ( - ) new changes  All other systems were reviewed with the patient and are negative.  PHYSICAL EXAMINATION: ECOG PERFORMANCE STATUS: 4 - Bedbound  Vitals:   12/18/20 1516 12/18/20 1751  BP: 108/84   Pulse: (!) 116   Resp: 16 18  Temp: 97.7 F (36.5 C)   SpO2: 94% 95%   Filed Weights   12/17/20 1926  Weight: 131 lb 9.8 oz (59.7 kg)    GENERAL: chronically ill appearing middle aged Caucasian female in NAD  SKIN: skin color, texture, turgor are normal, no rashes or significant lesions EYES: conjunctiva are pink and non-injected, sclera clear LUNGS: clear to auscultation and percussion with normal breathing effort HEART: regular rate & rhythm and no murmurs and no lower extremity edema Musculoskeletal: no cyanosis of digits and no clubbing  PSYCH: alert & oriented x 3, fluent speech NEURO: no focal motor/sensory deficits  LABORATORY DATA:  I have reviewed the data as listed CBC Latest Ref Rng & Units 12/17/2020 12/05/2020 12/04/2020  WBC 4.0 - 10.5 K/uL 20.2(H) 12.6(H) 14.4(H)  Hemoglobin 12.0 - 15.0 g/dL 9.8(L) 9.6(L) 9.4(L)  Hematocrit 36.0 - 46.0 % 30.2(L) 30.5(L) 30.1(L)  Platelets 150 - 400 K/uL 311 295 285    CMP Latest Ref Rng & Units 12/18/2020 12/17/2020 12/05/2020  Glucose 70 - 99 mg/dL - 130(H) 103(H)  BUN 6 - 20 mg/dL - 7 5(L)  Creatinine 0.44 - 1.00 mg/dL - 0.44 0.34(L)  Sodium 135 - 145 mmol/L - 131(L) 132(L)  Potassium 3.5 - 5.1 mmol/L 2.8(L) 2.3(LL) 2.8(L)  Chloride 98 - 111 mmol/L - 89(L) 98  CO2 22 - 32 mmol/L - 32 28  Calcium 8.9 - 10.3 mg/dL - 7.8(L) 8.1(L)  Total Protein 6.5 - 8.1 g/dL - 5.5(L) -  Total Bilirubin 0.3 - 1.2 mg/dL - 1.0 -  Alkaline Phos 38 - 126 U/L - 157(H) -  AST 15 - 41 U/L - 34 -  ALT 0 - 44 U/L - 11 -    RADIOGRAPHIC STUDIES:  CT ABDOMEN PELVIS WO CONTRAST  Result Date: 12/18/2020 CLINICAL DATA:  Feculent vaginal discharge, leukocytosis EXAM: CT ABDOMEN AND PELVIS WITHOUT  CONTRAST TECHNIQUE: Multidetector CT imaging of the abdomen and pelvis was performed following the standard protocol without IV contrast. COMPARISON:  12/03/2020 FINDINGS: Lower chest: Multiple pulmonary nodules are again identified within the visualized lung bases, stable since prior examination, several of which demonstrate cavitation and are compatible with bilateral pulmonary metastases. Moderate coronary artery calcification. Trace pericardial effusion is stable. Cardiac size within normal limits. Hepatobiliary: No focal liver abnormality is seen. No gallstones, gallbladder wall thickening, or biliary dilatation. Pancreas: Unremarkable Spleen: Unremarkable Adrenals/Urinary Tract: The adrenal glands are unremarkable. The kidneys are normal in size and position. No intrarenal or ureteral calculi are seen. There is no hydronephrosis. The bladder is markedly distended, progressive since prior examination, suggesting changes of possible bladder outlet obstruction. Stomach/Bowel: Extensive peritoneal thickening and nodularity is again identified, best appreciated within the right upper quadrant and right mid abdomen, as well as progressive nodular omental thickening in this region in keeping with peritoneal carcinomatosis. There is moderate ascites again identified., slightly increased in volume since prior examination. Surgical changes of abdominal peroneal resection and left lower quadrant descending, and colostomy are identified. There has developed marked circumferential bowel wall thickening and pericolonic inflammatory stranding in keeping with changes of a diffuse infectious or inflammatory colitis such as pseudomembranous colitis. No pneumatosis or mesenteric or portal venous gas identified. No evidence of obstruction. There is, additionally, diffuse mucosal fold thickening involving multiple loops of small bowel as well as the a duodenum. The findings can be seen in setting of hypoproteinemia with resultant  bowel edema, or venous obstruction with resultant venous congestion. A infectious or inflammatory enteritis is possible, but considered less likely given the extent and uniformity of the abnormality. These findings could, altogether, also be seen in the setting of intraperitoneal chemotherapy administration if there has been a history of such intervention. Enteric contrast is seen extending into multiple loops within the pelvis. There is a leak identified involving a single loop of small bowel posterior to the a vaginal cuff, best seen on axial image # 75 with extension of enteric contrast into the presacral space, and ultimately communicating with the vagina, best seen on axial image # 83. The cyst is seen altogether on sagittal image # 49. No evidence of obstruction.  No free intraperitoneal gas. Vascular/Lymphatic: There is moderate aortoiliac atherosclerotic calcification. No aortic aneurysm. No pathologic adenopathy within the abdomen and pelvis. Reproductive: Uterus absent.  No adnexal masses. Other: Mild developing subcutaneous edema within the flanks bilaterally in keeping with mild anasarca. Musculoskeletal: No acute bone abnormality. No lytic or blastic bone lesions. IMPRESSION: Interval development of marked pancolonic bowel wall thickening and mild pericolonic inflammatory stranding most suggestive of an infectious or inflammatory colitis such as pseudomembranous colitis. Superimposed long segment mucosal fold thickening involving the duodenum and proximal and mid small bowel. The stented disease favors a more diffuse process, as can be seen with hypoproteinemia and resultant bowel edema or, more likely, venous obstruction and resultant venous congestion of the bowel. Contrast enhanced examination or abdominal Doppler sonography with particular attention to the portal vein may be helpful to assess patency of the portal mesenteric system. Alternatively, these findings could be seen in the setting of  intraperitoneal chemotherapy administration and correlation with the patient's clinical history would be helpful. 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 Progressive peritoneal thickening  and peritoneal and omental nodularity in keeping with peritoneal carcinomatosis. Mild interval increase in ascites. Bibasilar pulmonary nodules in keeping with pulmonary metastatic disease. Electronically Signed   By: Fidela Salisbury MD   On: 12/18/2020 00:01   CT ABDOMEN PELVIS W CONTRAST  Result Date: 12/03/2020 CLINICAL DATA:  Abdominal distension. History of metastatic rectal cancer. EXAM: CT ABDOMEN AND PELVIS WITH CONTRAST TECHNIQUE: Multidetector CT imaging of the abdomen and pelvis was performed using the standard protocol following bolus administration of intravenous contrast. CONTRAST:  176m OMNIPAQUE IOHEXOL 300 MG/ML  SOLN COMPARISON:  Nov 18, 2020. FINDINGS: Lower chest: Pulmonary nodules are noted in the visualized lung bases concerning for metastatic disease. Hepatobiliary: No focal liver abnormality is seen. No gallstones, gallbladder wall thickening, or biliary dilatation. Pancreas: Unremarkable. No pancreatic ductal dilatation or surrounding inflammatory changes. Spleen: Normal in size without focal abnormality. Adrenals/Urinary Tract: Adrenal glands are unremarkable. Kidneys are normal, without renal calculi, focal lesion, or hydronephrosis. Bladder is unremarkable. Stomach/Bowel: The stomach is unremarkable. Mildly dilated small bowel loops are noted. Colostomy is again noted in the left lower quadrant. Increased dilatation of the colon is noted which extends to the colostomy, concerning for distal colonic obstruction. Potentially this may be related to inflammation of the colostomy itself. Vascular/Lymphatic: Aortic atherosclerosis. No enlarged abdominal or pelvic lymph nodes. Reproductive: Status post hysterectomy. No  adnexal masses. Other: Mild ascites is seen in the pelvis as well as around the liver and spleen. Subtle peritoneal wall thickening is seen posterior to inferior tip of right hepatic lobe; peritoneal implant or carcinomatosis cannot be excluded. Musculoskeletal: No acute or significant osseous findings. IMPRESSION: Pulmonary nodules are again noted in the visualized lung bases concerning for metastatic disease. Mild small bowel dilatation is noted, as well as increased dilatation of the colon which extends up to the colostomy in the left lower quadrant, concerning for distal colonic obstruction. Potentially this may related to inflammation of the colostomy itself. Mild ascites is noted in the pelvis as well as around the liver and spleen. Subtle peritoneal wall thickening is seen posterior to inferior tip of right hepatic lobe; peritoneal implant or carcinomatosis cannot be excluded. Electronically Signed   By: JMarijo ConceptionM.D.   On: 12/03/2020 20:05   DG Abd 2 Views  Result Date: 11/28/2020 CLINICAL DATA:  Colon carcinoma.  Abdominal pain EXAM: ABDOMEN - 2 VIEW COMPARISON:  CT abdomen and pelvis Nov 18, 2020 FINDINGS: Supine and upright images were obtained. There is mild stool volume in the colon. No appreciable bowel dilatation or air-fluid levels to suggest bowel obstruction. No free air. There is mild left base atelectasis. Lung bases otherwise clear. Bibasilar nodular opacities consistent with metastatic foci. IMPRESSION: No evident bowel obstruction or free air. Mild left base atelectasis. Nodular opacities in the lung bases consistent with metastases, noted on recent CT as well. Electronically Signed   By: WLowella GripIII M.D.   On: 11/28/2020 11:13   VAS [place]  Result Date: 12/18/2020 ABDOMINAL VISCERAL Patient Name:  Tina Morales  Date of Exam:   12/18/2020 Medical Rec #: [id number]  Accession #:    [id number] of Birth: 911-27-1967  Patient Gender: F Patient Age:   54Y [facility]  [facility] [place] Referring Phys: [id number] Mission-------------------------------------------------------------------------------- Indications: Rule out PV and SMV thrombosis. Limitations: Air/bowel gas, patient discomfort and ascites. Comparison Study: No previous exams Performing Technologist: [facility]  RVT, RDMS  Examination Guidelines: A complete evaluation includes B-mode imaging, spectral Doppler, color Doppler, and power Doppler as needed of all accessible portions of each vessel. Bilateral testing is considered an integral part of a complete examination. Limited examinations for reoccurring indications may be performed as noted.  Duplex Findings: +--------------------+--------+--------+------+--------+ Mesenteric          PSV cm/sEDV cm/sPlaqueComments +--------------------+--------+--------+------+--------+ Aorta Prox             77      17                  +--------------------+--------+--------+------+--------+ Celiac Artery Origin  133      43                  +--------------------+--------+--------+------+--------+ SMA Proximal          132      35                  +--------------------+--------+--------+------+--------+ SMA Mid                88      20                  +--------------------+--------+--------+------+--------+ SMA Distal             88      23                  +--------------------+--------+--------+------+--------+    Summary: Mesenteric: Normal Celiac artery and Superior Mesenteric artery findings. Portal vein and SMV not visualized in this exam. IVC and splenic vein are patent.  *See table(s) above for measurements and observations.     Preliminary    [place] ASCITES (ABDOMEN LIMITED)  Result Date: 12/01/2020 CLINICAL DATA:  Colorectal cancer, abdominal distension, assess for therapeutic paracentesis EXAM: LIMITED ABDOMEN ULTRASOUND FOR ASCITES TECHNIQUE: Limited ultrasound survey for ascites  was performed in all four abdominal quadrants. COMPARISON:  11/18/2020 FINDINGS: Survey ultrasound performed of the abdominal 4 quadrants. Small amount of right upper quadrant perihepatic ascites. No large volume ascites appreciated that warrants therapeutic paracentesis. Procedure not performed. IMPRESSION: Small amount of right upper quadrant perihepatic ascites. Electronically Signed   By: Jerilynn Mages.  Shick M.D.   On: 12/01/2020 12:05    ASSESSMENT & PLAN Tina Morales 55 y.o. female with medical history significant for metastatic rectal cancer who presents with passage of stool from her vagina and worsening pain.   After review of the labs, review of the records, and discussion with the patient the patients findings are most consistent with worsening clinical status, severe pain, and a new enterovaginal fistula.  Given these findings I do not believe the patient is a candidate for further treatments for her cancer.  I do believe that focusing on symptom management and palliative care is most appropriate at this time.  We appreciate the support of the surgical and palliative care services.  Dr. Benay Spice will be returning to clinic on 12/22/2020 and will be able to assume the patient's care at that time, though I do not believe there is any medical oncology option we will be able to offer at this time.  #Metastatic Rectal Cancer #Peritoneal Carcinomatosis #Enterovaginal Fistula, Nonoperative -- Given this patient's progressively widespread metastatic disease and nonoperable enterovaginal fistula I do not believe that she is a candidate for chemotherapy treatments at this time --Agree with goals of care discussions and focus on comfort based measures -- Appreciate the input of surgical services and  palliative care --Oncology service will continue to follow.  Dr. Benay Spice should be returning to clinic on 12/22/2020   All questions were answered. The patient knows to call the clinic with any problems, questions or  concerns.  A total of more than 55 minutes were spent on this encounter with face-to-face time and non-face-to-face time, including preparing to see the patient, ordering tests and/or medications, counseling the patient and coordination of care as outlined above.   Ledell Peoples, MD Department of Hematology/Oncology [place] [facility] Phone: [phone] Pager: [phone] Email: [email or web address]  12/18/2020 6:11 PM

## 2020-12-18 NOTE — Consult Note (Signed)
Consultation Note Date: 12/18/2020   Patient Name: Tina Morales  DOB: 05-20-1966  MRN: [id number]  Age / Sex: 55 y.o., female  PCP: Ladell Pier, MD Referring Physician: Edison Pace Md, MD  Reason for Consultation: Pain control  HPI/Patient Profile: 55 y.o. female  admitted on 12/17/2020   Clinical Assessment and Goals of Care: 55 year old lady patient of Dr. Benay Spice from medical oncology, known to palliative medicine service, seen during recent past hospitalization for pain management with life limiting history of rectal cancer status post prior APR with posterior vaginectomy for locally advanced rectal cancer, following neoadjuvant chemoradiation therapy.  Found to have enlarging lung nodules and pulmonary metastases.  Patient was recently admitted last month with obstructive symptoms and her ostomy was seen and evaluated by surgery.  Patient presented to the hospital with severe vaginal pain followed by passage of stool from her vagina.  A palliative medicine consultation has been requested for pain management as well as goals of care discussions.  Abdominal imaging was repeated and shows concerns for pancolonic bowel wall thickening possibly infectious or inflammatory such as pseudomembranous colitis, possibly venous obstruction/venous congestion.  There is also concern for enterovaginal fistula as well as progressive peritoneal carcinomatosis with increasing ascites.  Patient is sitting by the edge of her bed, husband present at bedside.  Patient states that she literally has to use the bathroom every 5 minutes.  She is having urinary urgency frequency as well as is complaining of passing fecal material/mucus from her vagina.  She appears in moderate to severe distress.  Discussed with bedside RN, chart reviewed regarding her opioid use.  Patient is on long-acting OxyContin 10 mg every 12 hours.  She has also been  started on 1 to 2 mg of IV Dilaudid to be used on an as-needed basis, since admission in the past 23 hours, she has required 9 mg total IV Dilaudid.  She also has by mouth oxycodone immediate release available.  I discussed with the patient about PCA.  Discussed about bolus only Dilaudid PCA.  Explained to the patient about provision for loading dose for acute incident pain as well as having bolus dosed Dilaudid available to her in addition to continuing by mouth long-acting OxyContin.  She is agreeable to attempt Dilaudid PCA bolus only for better pain management.  Discussed with bedside RN about possibility of placing an external purewick device because the patient is having to get up and go to the bathroom 4-5 times in an hour which is certainly exacerbating her generalized pain.  At the time of this initial consultation, all of her the attention was directed towards pain and on pain symptom management.  Patient is in distress.  Patient's husband is also present at the bedside and most concerned about her condition.  Agree with initiating goals of care discussions and discussions with regards to pursuing more symptom focused care given the patient's current condition and recent imaging.  We will also seek input from medical oncology.  For tonight, all efforts to be directed at  adequate pain management, palliative to follow.  Thank you for the consult.  HCPOA  Husband, present at bedside.   SUMMARY OF RECOMMENDATIONS   Continue long-acting OxyContin as scheduled Bolus dosage Dilaudid PCA, monitor use Continue ongoing CODE STATUS and goals of care conversations once the patient has better pain control and also after touching base with medical oncology, palliative to follow-up on 12-19-2020. Thank you for the consult.  Code Status/Advance Care Planning: Full code   Symptom Management:     Palliative Prophylaxis:  Frequent Pain Assessment  Additional Recommendations (Limitations, Scope,  Preferences): Full Scope Treatment  Psycho-social/Spiritual:  Desire for further Chaplaincy support:yes Additional Recommendations: Caregiving  Support/Resources  Prognosis:  Unable to determine  Discharge Planning: To Be Determined      Primary Diagnoses: Present on Admission:  Vaginal pain   I have reviewed the medical record, interviewed the patient and family, and examined the patient. The following aspects are pertinent.  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   Anxiety    Cancer ([place]) 02/10/2018   Stage 4 Colorectal Cancer, spot on her lungs   Migraine    Social History   Socioeconomic History   Marital status: Married    Spouse name: Not on file   Number of children: Not on file   Years of education: Not on file   Highest education level: Not on file  Occupational History   Not on file  Tobacco Use   Smoking status: Former    Years: 30.00    Pack years: 0.00    Types: Cigarettes    Quit date: 02/2018    Years since quitting: 2.8   Smokeless tobacco: Never  Vaping Use   Vaping Use: Never used  Substance and Sexual Activity   Alcohol use: Not Currently    Comment: per Dr Dema Severin chart, "no drink in the last 5 years"   Drug use: Never   Sexual activity: Not on file  Other Topics Concern   Not on file  Social History Narrative   Not on file   Social Determinants of Health   Financial Resource Strain: Not on file  Food Insecurity: Not on file  Transportation Needs: Not on file  Physical Activity: Not on file  Stress: Not on file  Social Connections: Not on file   Family History  Problem Relation Age of Onset   Breast cancer Maternal Aunt    Throat cancer Cousin    Cancer Cousin    Throat cancer Cousin    Lung cancer Paternal Uncle    Scheduled Meds:  Chlorhexidine Gluconate Cloth  6 each Topical Daily   feeding supplement  1 Container Oral TID BM   feeding supplement  237 mL Oral BID BM   HYDROmorphone    Intravenous Q4H   oxyCODONE  10 mg Oral Q12H   pantoprazole  40 mg Oral QHS   polycarbophil  625 mg Oral Daily   potassium chloride SA  20 mEq Oral BID   Continuous Infusions:  cefTRIAXone (ROCEPHIN)  IV     dextrose 5 % and 0.9 % NaCl with KCl 20 mEq/L 75 mL/hr at 12/18/20 1156   PRN Meds:.acetaminophen **OR** acetaminophen, ALPRAZolam, diphenhydrAMINE **OR** diphenhydrAMINE, diphenhydrAMINE **OR** diphenhydrAMINE, metoprolol tartrate, naloxone **AND** sodium chloride flush, ondansetron **OR** ondansetron (ZOFRAN) IV, ondansetron (ZOFRAN) IV, prochlorperazine **OR** prochlorperazine, simethicone Medications Prior to Admission:  Prior to Admission medications   Medication Sig Start Date End Date Taking? Authorizing Provider  acetaminophen (TYLENOL)  500 MG tablet Take 1,000 mg by mouth every 8 (eight) hours as needed (PAIN).    Yes [provider]  ALPRAZolam (XANAX) 0.5 MG tablet Take 1 tablet (0.5 mg total) by mouth 2 (two) times daily as needed for anxiety. Patient taking differently: Take 1 mg by mouth 2 (two) times daily as needed. 08/14/20  Yes Ladell Pier, MD  dexamethasone (DECADRON) 4 MG tablet #2 tablets twice daily x 3 days. Start day after chemo treatment 11/05/20  Yes Ladell Pier, MD  dicyclomine (BENTYL) 10 MG capsule Take 1 capsule (10 mg total) by mouth 3 (three) times daily before meals. 12/05/20  Yes Domenic Polite, MD  docusate sodium (COLACE) 100 MG capsule Take 1 capsule (100 mg total) by mouth 2 (two) times daily. 12/05/20  Yes Domenic Polite, MD  HYDROcodone-acetaminophen (NORCO) 5-325 MG tablet Take 1-2 tablets by mouth every 6 (six) hours as needed for moderate pain. 12/11/20  Yes Owens Shark, NP  lidocaine-prilocaine (EMLA) cream Apply 1 application topically as needed (port). 06/30/20  Yes Ladell Pier, MD  loratadine (CLARITIN) 10 MG tablet Take 10 mg by mouth daily as needed for allergies.   Yes [provider]  Magnesium Oxide 250 MG  TABS Take 500 mg by mouth 2 (two) times daily.   Yes [provider]  omeprazole (PRILOSEC) 20 MG capsule Take 20 mg by mouth See admin instructions. 20mg  oral daily And 20mg  daily as needed for indigestion   Yes [provider]  polyethylene glycol (MIRALAX) 17 g packet Take 17 g by mouth 2 (two) times daily. 12/05/20  Yes Domenic Polite, MD  potassium chloride SA (KLOR-CON) 20 MEQ tablet Take 1 tablet (20 mEq total) by mouth 2 (two) times daily. 11/05/20  Yes Ladell Pier, MD  prochlorperazine (COMPAZINE) 10 MG tablet Take 1 tablet (10 mg total) by mouth every 6 (six) hours as needed for nausea or vomiting. 06/12/20  Yes Ladell Pier, MD  diphenoxylate-atropine (LOMOTIL) 2.5-0.025 MG tablet Take 2 tablets by mouth 4 (four) times daily as needed for diarrhea or loose stools. 06/12/20   Ladell Pier, MD   Allergies  Allergen Reactions   Sulfa Antibiotics     Patient unaware of side effects, she was told when she was a child that she was allergic   Review of Systems Complains of abdominal discomfort generalized weakness and pain in her vaginal area. Physical Exam Appears weak, appears in at least moderate distress Regular work of breathing S1-S2 Diffuse mild abdominal tenderness Has stoma Patient is distraught due to her discomfort from enterovaginal fistula frequently using the bathroom  Vital Signs: BP 108/84 (BP Location: Right Arm)   Pulse (!) 116   Temp 97.7 F (36.5 C) (Oral)   Resp 16   Ht 5\' 8"  (1.727 m)   Wt 59.7 kg   SpO2 94%   BMI 20.01 kg/m  Pain Scale: 0-10 POSS *See Group Information*: 1-Acceptable,Awake and alert Pain Score: 8    SpO2: SpO2: 94 % O2 Device:SpO2: 94 % O2 Flow Rate: .   IO: Intake/output summary:  Intake/Output Summary (Last 24 hours) at 12/18/2020 1730 Last data filed at 12/18/2020 0500 Gross per 24 hour  Intake 689.49 ml  Output --  Net 689.49 ml    LBM: Last BM Date: 12/17/20 Baseline Weight: Weight: 59.7  kg Most recent weight: Weight: 59.7 kg     Palliative Assessment/Data:   PPS 50 %  Time In:  1630 Time  Out:  1730 Time Total:  60 min.  Greater than 50%  of this time was spent counseling and coordinating care related to the above assessment and plan.  Signed by: Loistine Chance, MD   Please contact Palliative Medicine Team phone at [phone] for questions and concerns.  For individual provider: See Shea Evans

## 2020-12-19 ENCOUNTER — Inpatient Hospital Stay (HOSPITAL_COMMUNITY): Payer: BC Managed Care – PPO

## 2020-12-19 DIAGNOSIS — Z7189 Other specified counseling: Secondary | ICD-10-CM

## 2020-12-19 DIAGNOSIS — R531 Weakness: Secondary | ICD-10-CM

## 2020-12-19 DIAGNOSIS — R52 Pain, unspecified: Secondary | ICD-10-CM

## 2020-12-19 LAB — CBC
HCT: 31.3 % — ABNORMAL LOW (ref 36.0–46.0)
Hemoglobin: 9.8 g/dL — ABNORMAL LOW (ref 12.0–15.0)
MCH: 21.7 pg — ABNORMAL LOW (ref 26.0–34.0)
MCHC: 31.3 g/dL (ref 30.0–36.0)
MCV: 69.4 fL — ABNORMAL LOW (ref 80.0–100.0)
Platelets: 310 10*3/uL (ref 150–400)
RBC: 4.51 MIL/uL (ref 3.87–5.11)
RDW: 20.3 % — ABNORMAL HIGH (ref 11.5–15.5)
WBC: 23.3 10*3/uL — ABNORMAL HIGH (ref 4.0–10.5)
nRBC: 0.8 % — ABNORMAL HIGH (ref 0.0–0.2)

## 2020-12-19 LAB — BASIC METABOLIC PANEL
Anion gap: 7 (ref 5–15)
BUN: 6 mg/dL (ref 6–20)
CO2: 28 mmol/L (ref 22–32)
Calcium: 7.7 mg/dL — ABNORMAL LOW (ref 8.9–10.3)
Chloride: 94 mmol/L — ABNORMAL LOW (ref 98–111)
Creatinine, Ser: 0.39 mg/dL — ABNORMAL LOW (ref 0.44–1.00)
GFR, Estimated: >60 mL/min (ref >60)
Glucose, Bld: 119 mg/dL — ABNORMAL HIGH (ref 70–99)
Potassium: 3.8 mmol/L (ref 3.5–5.1)
Sodium: 129 mmol/L — ABNORMAL LOW (ref 135–145)

## 2020-12-19 LAB — GASTROINTESTINAL PANEL BY PCR, STOOL (REPLACES STOOL CULTURE)

## 2020-12-19 LAB — MAGNESIUM: Magnesium: 1.9 mg/dL (ref 1.7–2.4)

## 2020-12-19 NOTE — Progress Notes (Addendum)
Subjective/Chief Complaint: Feeling better today- PCA has made things tolerable.    Objective: Vital signs in last 24 hours: Temp:  [97.4 F (36.3 C)-100.9 F (38.3 C)] 97.4 F (36.3 C) (06/10 0401) Pulse Rate:  [107-138] 107 (06/10 0401) Resp:  [14-18] 16 (06/10 0753) BP: (95-144)/(74-106) 104/80 (06/10 0401) SpO2:  [93 %-97 %] 96 % (06/10 0753) FiO2 (%):  [0 %] 0 % (06/09 1751) Last BM Date: 12/19/20  Intake/Output from previous day: 06/09 0701 - 06/10 0700 In: 240 [P.O.:240] Out: -  Intake/Output this shift: No intake/output data recorded.  General appearance: alert and cooperative Resp: unlabored GI: soft, distended, diffusely mildly tender. Stoma pink and healthy.  Lab Results:  Recent Labs    12/17/20 2052 12/19/20 0459  WBC 20.2* 23.3*  HGB 9.8* 9.8*  HCT 30.2* 31.3*  PLT 311 310   BMET Recent Labs    12/17/20 2052 12/18/20 0842 12/19/20 0459  NA 131*  --  129*  K 2.3* 2.8* 3.8  CL 89*  --  94*  CO2 32  --  28  GLUCOSE 130*  --  119*  BUN 7  --  6  CREATININE 0.44  --  0.39*  CALCIUM 7.8*  --  7.7*   PT/INR No results for input(s): LABPROT, INR in the last 72 hours. ABG No results for input(s): PHART, HCO3 in the last 72 hours.  Invalid input(s): PCO2, PO2  Studies/Results: CT ABDOMEN PELVIS WO CONTRAST  Result Date: 12/18/2020 CLINICAL DATA:  Feculent vaginal discharge, leukocytosis EXAM: CT ABDOMEN AND PELVIS WITHOUT CONTRAST TECHNIQUE: Multidetector CT imaging of the abdomen and pelvis was performed following the standard protocol without IV contrast. COMPARISON:  12/03/2020 FINDINGS: Lower chest: Multiple pulmonary nodules are again identified within the visualized lung bases, stable since prior examination, several of which demonstrate cavitation and are compatible with bilateral pulmonary metastases. Moderate coronary artery calcification. Trace pericardial effusion is stable. Cardiac size within normal limits. Hepatobiliary: No focal  liver abnormality is seen. No gallstones, gallbladder wall thickening, or biliary dilatation. Pancreas: Unremarkable Spleen: Unremarkable Adrenals/Urinary Tract: The adrenal glands are unremarkable. The kidneys are normal in size and position. No intrarenal or ureteral calculi are seen. There is no hydronephrosis. The bladder is markedly distended, progressive since prior examination, suggesting changes of possible bladder outlet obstruction. Stomach/Bowel: Extensive peritoneal thickening and nodularity is again identified, best appreciated within the right upper quadrant and right mid abdomen, as well as progressive nodular omental thickening in this region in keeping with peritoneal carcinomatosis. There is moderate ascites again identified., slightly increased in volume since prior examination. Surgical changes of abdominal peroneal resection and left lower quadrant descending, and colostomy are identified. There has developed marked circumferential bowel wall thickening and pericolonic inflammatory stranding in keeping with changes of a diffuse infectious or inflammatory colitis such as pseudomembranous colitis. No pneumatosis or mesenteric or portal venous gas identified. No evidence of obstruction. There is, additionally, diffuse mucosal fold thickening involving multiple loops of small bowel as well as the a duodenum. The findings can be seen in setting of hypoproteinemia with resultant bowel edema, or venous obstruction with resultant venous congestion. A infectious or inflammatory enteritis is possible, but considered less likely given the extent and uniformity of the abnormality. These findings could, altogether, also be seen in the setting of intraperitoneal chemotherapy administration if there has been a history of such intervention. Enteric contrast is seen extending into multiple loops within the pelvis. There is a leak identified involving a single  loop of small bowel posterior to the a vaginal cuff,  best seen on axial image # 75 with extension of enteric contrast into the presacral space, and ultimately communicating with the vagina, best seen on axial image # 83. The cyst is seen altogether on sagittal image # 49. No evidence of obstruction.  No free intraperitoneal gas. Vascular/Lymphatic: There is moderate aortoiliac atherosclerotic calcification. No aortic aneurysm. No pathologic adenopathy within the abdomen and pelvis. Reproductive: Uterus absent.  No adnexal masses. Other: Mild developing subcutaneous edema within the flanks bilaterally in keeping with mild anasarca. Musculoskeletal: No acute bone abnormality. No lytic or blastic bone lesions. IMPRESSION: Interval development of marked pancolonic bowel wall thickening and mild pericolonic inflammatory stranding most suggestive of an infectious or inflammatory colitis such as pseudomembranous colitis. Superimposed long segment mucosal fold thickening involving the duodenum and proximal and mid small bowel. The stented disease favors a more diffuse process, as can be seen with hypoproteinemia and resultant bowel edema or, more likely, venous obstruction and resultant venous congestion of the bowel. Contrast enhanced examination or abdominal Doppler sonography with particular attention to the portal vein may be helpful to assess patency of the portal mesenteric system. Alternatively, these findings could be seen in the setting of intraperitoneal chemotherapy administration and correlation with the patient's clinical history would be helpful. 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 Progressive peritoneal thickening and peritoneal and omental nodularity in keeping with peritoneal carcinomatosis. Mild interval increase in ascites. Bibasilar pulmonary nodules in keeping with pulmonary metastatic disease. Electronically Signed   By: Fidela Salisbury MD   On: 12/18/2020 00:01   VAS  [place] MESENTERIC  Result Date: 12/18/2020 ABDOMINAL VISCERAL Patient Name:  Tina Morales  Date of Exam:   12/18/2020 Medical Rec #: [id number]   Accession #:    [id number] Date of Birth: [birth year]   Patient Gender: F Patient Age:   54Y Exam Location:  [facility] Procedure:      VAS [place] MESENTERIC Referring Phys: [id number] [place] -------------------------------------------------------------------------------- Indications: Rule out PV and SMV thrombosis. Limitations: Air/bowel gas, patient discomfort and ascites. Comparison Study: No previous exams Performing Technologist: Jody Hill RVT, RDMS  Examination Guidelines: A complete evaluation includes B-mode imaging, spectral Doppler, color Doppler, and power Doppler as needed of all accessible portions of each vessel. Bilateral testing is considered an integral part of a complete examination. Limited examinations for reoccurring indications may be performed as noted.  Duplex Findings: +--------------------+--------+--------+------+--------+ Mesenteric          PSV cm/sEDV cm/sPlaqueComments +--------------------+--------+--------+------+--------+ Aorta Prox             77      17                  +--------------------+--------+--------+------+--------+ Celiac Artery Origin  133      43                  +--------------------+--------+--------+------+--------+ SMA Proximal          132      35                  +--------------------+--------+--------+------+--------+ SMA Mid                88      20                  +--------------------+--------+--------+------+--------+ SMA Distal  88      23                  +--------------------+--------+--------+------+--------+    Summary: Mesenteric: Normal Celiac artery and Superior Mesenteric artery findings. Portal vein and SMV not visualized in this exam. IVC and splenic vein are patent.  *See table(s) above for measurements and observations.  Diagnosing physician:  Monica Martinez MD  Electronically signed by Monica Martinez MD on 12/18/2020 at 7:12:49 PM.    Final    US LIVER DOPPLER LIMITED (PV OR SINGLE ART/VEIN)  Result Date: 12/19/2020 CLINICAL DATA:  Portal vein thrombosis, colorectal carcinoma stage IV EXAM: DUPLEX ULTRASOUND OF LIVER LIMITED TECHNIQUE: Limited color and duplex Doppler ultrasound was performed to evaluate the portal vein. COMPARISON:  CT 12/03/2020 and previous FINDINGS: Liver: Normal parenchymal echogenicity. Normal hepatic contour without nodularity. No focal lesion, mass or intrahepatic biliary ductal dilatation. Main Portal Vein size: 1.4 cm Portal Vein Velocities (all hepatopetal with monophasic waveform): Main  :  21 cm/sec Right: 24 cm/sec Left: 17 cm/sec IVC: Intrahepatic segment patent. Portal Vein Occlusion/Thrombus: No Ascites: Small volume perihepatic Varices: None Incidental note made of dilated CBD up to 10 mm diameter, stable by my measurement compared to prior CT. IMPRESSION: 1. Patent portal vein and main intrahepatic branches with antegrade monophasic flow signal. No evidence of portal vein thrombosis. 2. Perihepatic ascites as before 3. Stable dilatation of the CBD. Electronically Signed   By: Lucrezia Europe M.D.   On: 12/19/2020 08:20    Anti-infectives: Anti-infectives (From admission, onward)    Start     Dose/Rate Route Frequency Ordered Stop   12/18/20 1730  cefTRIAXone (ROCEPHIN) 1 g in sodium chloride 0.9 % 100 mL IVPB       Note to Pharmacy: Ok to adjust dose as needed   1 g 200 mL/hr over 30 Minutes Intravenous Every 24 hours 12/18/20 1643         Assessment/Plan:  Anxiety-continue home xanax GERD-PPI Anemia of chronic disease-hemoglobin stable UTI- rocephin, purwick is helping with urinary frequency   Enterovaginal fistula Metastatic rectal cancer on chemotherapy S/p APR with end colostomy (Dr. Dema Severin) and radical vaginectomy (Dr. Denman George) 09/01/2018   -CT demonstrates pancolonic bowel wall thickening  and mild pericolonic inflammatory stranding concerning for infectious or inflammatory colitis such as pseudomembranous colitis.  Long segment mucosal fold thickening in the duodenum and proximal and mid small bowel, all of this is likely related to hypoproteinemia but possibly venous obstruction and venous congestion. C diff negative. GI panel negative. Liver duplex negative for portal vein thrombus. Suspect much of the radiographic appearance is due to progressive malignancy/hypoproteinemia   Enterovaginal fistula -continue fiber supplement, may titrate other meds to thicken up her small bowel contents and hopefully decrease the output. Difficult to quantify. This is not going to be surgically correctable given peritoneal carcinomatosis, malignant (?) ascites and radiated pelvis. Attempting to repair this transabdominally will almost certainly create multiple enterotomies/other fistulae/ increased suffering for the patient. Would not expect a transvaginal attempt at closure to be successful and would likely worsen the fistula ultimately.   She appears to have progressive peritoneal carcinomatosis with increasing ascites.  Appreciate care from palliative service and oncology.      ID - none VTE - lovenox FEN - full liquid diet Foley - purewick   LOS: 2 days    Clovis Riley 12/19/2020

## 2020-12-19 NOTE — Progress Notes (Signed)
Initial Nutrition Assessment  INTERVENTION:   -Ensure Enlive po BID, each supplement provides 350 kcal and 20 grams of protein  -Boost Breeze po TID, each supplement provides 250 kcal and 9 grams of protein  NUTRITION DIAGNOSIS:   Increased nutrient needs related to cancer and cancer related treatments as evidenced by estimated needs.  GOAL:   Patient will meet greater than or equal to 90% of their needs  MONITOR:   PO intake, Supplement acceptance, Labs, Weight trends, I & O's  REASON FOR ASSESSMENT:   Malnutrition Screening Tool    ASSESSMENT:   55yo patient with history of rectal cancer with prior APR with posterior vaginectomy 08/2018 for locally advanced rectal cancer (following neoadjuvant cXRT), ultimately with enlarging lung nodules and found to have pulmonary metastases. She has been primairly following as an outpatient with medical oncology and has been maintained on systemic therapy including Avastin. Last dose was end of April . She was admitted twice in May with obstructive symptoms, went home 5/27. Reports she has been doing fine with decent ostomy function, has not needed to catheterize her stoma in about 4 days, but yesterday began having severe vaginal pain followed by passage of stool from the vagina.  Patient familiar to RD from previous admissions.  Per chart review, pt undergoing GOC discussions based on now not being a candidate for further cancer treatment and surgery for enterovaginal fistula.  Has been ordered Ensure and Boost Breeze as pt has had these before.   Per weight records, pt has had stable weights.  Medications: Fibercon, KLOR-CON, D5 infusion, IV Zofran   Labs reviewed: Low Na  NUTRITION - FOCUSED PHYSICAL EXAM:  Deferred.  Diet Order:   Diet Order             Diet full liquid Room service appropriate? Yes; Fluid consistency: Thin  Diet effective now                   EDUCATION NEEDS:   No education needs have been  identified at this time  Skin:  Skin Assessment: Reviewed RN Assessment  Last BM:  6/10 - colostomy  Height:   Ht Readings from Last 1 Encounters:  12/17/20 5\' 8"  (1.727 m)    Weight:   Wt Readings from Last 1 Encounters:  12/17/20 59.7 kg    BMI:  Body mass index is 20.01 kg/m.  Estimated Nutritional Needs:   Kcal:  1800-2000  Protein:  90-105g  Fluid:  2L/day   Clayton Bibles, MS, RD, LDN Inpatient Clinical Dietitian Contact information available via Amion

## 2020-12-19 NOTE — Progress Notes (Signed)
Daily Progress Note   Patient Name: Tina Morales       Date: 12/19/2020 DOB: [birth year]  Age: 55 y.o. MRN#: [id number] Attending Physician: Nolon Nations, MD Primary Care Physician: Ladell Pier, MD Admit Date: 12/17/2020  Reason for Consultation/Follow-up: pain control and goals of care.   Subjective:  Awake alert, talking with her sister on the phone, pain much better controlled since initiation of PCA, able to tolerate PO, briefly initiated broad goals of care discussions with her today.   Length of Stay: 2  Current Medications: Scheduled Meds:   Chlorhexidine Gluconate Cloth  6 each Topical Daily   feeding supplement  1 Container Oral TID BM   feeding supplement  237 mL Oral BID BM   HYDROmorphone   Intravenous Q4H   oxyCODONE  10 mg Oral Q12H   pantoprazole  40 mg Oral QHS   polycarbophil  625 mg Oral Daily   potassium chloride SA  20 mEq Oral BID    Continuous Infusions:  cefTRIAXone (ROCEPHIN)  IV 1 g (12/18/20 1840)   dextrose 5 % and 0.9 % NaCl with KCl 20 mEq/L 75 mL/hr at 12/19/20 0123    PRN Meds: acetaminophen **OR** acetaminophen, ALPRAZolam, diphenhydrAMINE **OR** diphenhydrAMINE, diphenhydrAMINE **OR** diphenhydrAMINE, metoprolol tartrate, naloxone **AND** sodium chloride flush, ondansetron **OR** ondansetron (ZOFRAN) IV, ondansetron (ZOFRAN) IV, prochlorperazine **OR** prochlorperazine, simethicone  Physical Exam         Awake alert No distress Using PCA on an as needed basis Regular work of breathing Abdomen mildly distended No edema Some frailty and generalized weakness  Vital Signs: BP 104/80   Pulse (!) 107   Temp (!) 97.4 F (36.3 C) (Oral)   Resp 16   Ht 5\' 8"  (1.727 m)   Wt 59.7 kg   SpO2 96%   BMI 20.01 kg/m  SpO2: SpO2: 96 % O2 Device: O2  Device: Nasal Cannula O2 Flow Rate: O2 Flow Rate (L/min): 2 L/min  Intake/output summary:  Intake/Output Summary (Last 24 hours) at 12/19/2020 1225 Last data filed at 12/19/2020 1020 Gross per 24 hour  Intake 360 ml  Output --  Net 360 ml   LBM: Last BM Date: 12/19/20 Baseline Weight: Weight: 59.7 kg Most recent weight: Weight: 59.7 kg      PPS 50% Palliative Assessment/Data:  Patient Active Problem List   Diagnosis Date Noted   Vaginal pain 12/17/2020   Cancer associated pain    Palliative care by specialist    Abdominal pain 12/02/2020   Diarrhea 11/26/2020   Colorectal cancer ([place]) 11/18/2020   Hypokalemia 11/18/2020   Elevated blood pressure reading without diagnosis of hypertension 11/18/2020   Dehiscence of perineal wound 10/04/2018   Wound infection 10/04/2018   Port-A-Cath in place 07/07/2018   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   Recommendations/Plan:  Continue PCA Briefly started code status and goals of care discussions with patient, she would like to continue these discussions with oncology as well.  Continue current mode of care.   Goals of Care and Additional Recommendations: Limitations on Scope of Treatment: Full Scope Treatment  Code Status:    Code Status Orders  (From admission, onward)           Start     Ordered   12/17/20 1855  Full code  Continuous        12/17/20 1856           Code Status History     Date Active Date Inactive Code Status Order ID Comments  User Context   11/26/2020 1621 12/06/2020 1630 Full Code [id number]  Kayleen Memos, DO Inpatient   11/18/2020 2126 11/20/2020 1815 Full Code [id number]  Chotiner, Yevonne Aline, MD Inpatient   10/04/2018 1754 10/06/2018 1649 Full Code [id number]  Kinsinger, Arta Bruce, MD Inpatient   10/04/2018 1633 10/04/2018 1731 Full Code [id number]  Lorette Ang Outpatient   09/01/2018 0615 09/05/2018 1437 Full Code [id number]  Dorothyann Gibbs, NP Inpatient       Prognosis:  Unable to determine  Discharge Planning: To Be Determined  Care plan was discussed with patient and bedside RN.   Thank you for allowing the Palliative Medicine Team to assist in the care of this patient.   Time In: 11 Time Out: 11.35 Total Time 35 Prolonged Time Billed  no       Greater than 50%  of this time was spent counseling and coordinating care related to the above assessment and plan.  Loistine Chance, MD  Please contact Palliative Medicine Team phone at [phone] for questions and concerns.

## 2020-12-20 NOTE — Progress Notes (Signed)
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 Dema Severin) and radical vaginectomy (Dr. Denman George) 09/01/2018   -CT demonstrates pancolonic bowel wall thickening and mild pericolonic inflammatory stranding concerning for infectious or inflammatory colitis such as pseudomembranous colitis.  Long segment mucosal fold thickening in the duodenum and proximal and mid small bowel, all of this is likely related to hypoproteinemia but possibly venous obstruction and venous congestion. C diff negative. GI panel negative. Liver duplex negative for portal vein thrombus. Suspect much of the radiographic appearance is due to progressive malignancy/hypoproteinemia   Agree with plan as outlined by Dr. Kae Heller - "Enterovaginal fistula -continue fiber supplement, may titrate other meds to thicken up her small bowel contents and hopefully decrease the output. Difficult to quantify. This is not going to be surgically correctable given peritoneal carcinomatosis, malignant (?) ascites and radiated pelvis. Attempting to repair this transabdominally will almost certainly create multiple enterotomies/other fistulae/ increased suffering for the patient. 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  Appreciate care from palliative service and oncology.  Dr. Benay Spice and Dr. Dema Severin back on Monday, 6/13, and will ask to see and guide management.   ID - none VTE - lovenox FEN - full liquid diet Foley - purewick         Armandina Gemma, MD       [facility]       Office: [phone]   Chief Complaint: Vaginal fistula  Subjective: Patient in bed, more comfortable.  Sleeping some.  Purewick is helpful.   Taking limited full liquids.  Objective: Vital signs in last 24 hours: Temp:  [97.6 F (36.4 C)-98.7 F (37.1 C)] 98.7 F (37.1 C) (06/11 0616) Pulse Rate:  [104-114] 114 (06/11 0616) Resp:  [14-18] 14 (06/11 0844) BP: (98-114)/(69-82) 98/69 (06/11 0616) SpO2:  [95 %-100 %] 100 % (06/11 0844) FiO2 (%):  [0 %] 0 % (06/11 0502) Last BM Date: 12/19/20  Intake/Output from previous day: 06/10 0701 - 06/11 0700 In: 240 [P.O.:240] Out: -  Intake/Output this shift: No intake/output data recorded.  Physical Exam: HEENT - sclerae clear, mucous membranes moist Neck - soft Abdomen - mild distension; minimal output from ostomy Ext - no edema, non-tender Neuro - alert & oriented, no focal deficits  Lab Results:  Recent Labs    12/17/20 2052 12/19/20 0459  WBC 20.2* 23.3*  HGB 9.8* 9.8*  HCT 30.2* 31.3*  PLT 311 310   BMET Recent Labs    12/17/20 2052 12/18/20 0842 12/19/20 0459  NA 131*  --  129*  K 2.3* 2.8* 3.8  CL 89*  --  94*  CO2 32  --  28  GLUCOSE 130*  --  119*  BUN 7  --  6  CREATININE 0.44  --  0.39*  CALCIUM 7.8*  --  7.7*   PT/INR No results for input(s): LABPROT, INR in the last 72 hours. Comprehensive Metabolic Panel:    Component Value Date/Time   NA 129 (L) 12/19/2020 0459   NA 131 (L) 12/17/2020 2052   K 3.8 12/19/2020 0459   K 2.8 (L) 12/18/2020 0842   CL 94 (L) 12/19/2020 0459   CL 89 (L) 12/17/2020 2052   CO2 28 12/19/2020 0459  CO2 32 12/17/2020 2052   BUN 6 12/19/2020 0459   BUN 7 12/17/2020 2052   CREATININE 0.39 (L) 12/19/2020 0459   CREATININE 0.44 12/17/2020 2052   CREATININE 0.38 (L) 11/26/2020 0919   CREATININE 0.32 (L) 11/05/2020 0930   GLUCOSE 119 (H) 12/19/2020 0459   GLUCOSE 130 (H) 12/17/2020 2052   CALCIUM 7.7 (L) 12/19/2020 0459   CALCIUM 7.8 (L) 12/17/2020 2052   AST 34 12/17/2020 2052   AST 24 12/04/2020 0533   AST 33 11/26/2020 0919   AST 46 (H) 11/05/2020 0930   ALT 11 12/17/2020 2052   ALT 11 12/04/2020 0533    ALT 10 11/26/2020 0919   ALT 30 11/05/2020 0930   ALKPHOS 157 (H) 12/17/2020 2052   ALKPHOS 179 (H) 12/04/2020 0533   BILITOT 1.0 12/17/2020 2052   BILITOT 1.0 12/04/2020 0533   BILITOT 1.0 11/26/2020 0919   BILITOT 0.7 11/05/2020 0930   PROT 5.5 (L) 12/17/2020 2052   PROT 5.0 (L) 12/04/2020 0533   ALBUMIN 2.4 (L) 12/17/2020 2052   ALBUMIN 2.4 (L) 12/04/2020 0533    Studies/Results: VAS [place] MESENTERIC  Result Date: 12/18/2020 ABDOMINAL VISCERAL Patient Name:  Tina Morales  Date of Exam:   12/18/2020 Medical Rec #: [id number]   Accession #:    [id number] Date of Birth: 1966/04/13   Patient Gender: F Patient Age:   55Y Exam Location:  [facility] Procedure:      VAS [place] MESENTERIC Referring Phys: [id number] Rice -------------------------------------------------------------------------------- Indications: Rule out PV and SMV thrombosis. Limitations: Air/bowel gas, patient discomfort and ascites. Comparison Study: No previous exams Performing Technologist: Jody Hill RVT, RDMS  Examination Guidelines: A complete evaluation includes B-mode imaging, spectral Doppler, color Doppler, and power Doppler as needed of all accessible portions of each vessel. Bilateral testing is considered an integral part of a complete examination. Limited examinations for reoccurring indications may be performed as noted.  Duplex Findings: +--------------------+--------+--------+------+--------+ Mesenteric          PSV cm/sEDV cm/sPlaqueComments +--------------------+--------+--------+------+--------+ Aorta Prox             77      17                  +--------------------+--------+--------+------+--------+ Celiac Artery Origin  133      43                  +--------------------+--------+--------+------+--------+ SMA Proximal          132      35                  +--------------------+--------+--------+------+--------+ SMA Mid                88      20                   +--------------------+--------+--------+------+--------+ SMA Distal             88      23                  +--------------------+--------+--------+------+--------+    Summary: Mesenteric: Normal Celiac artery and Superior Mesenteric artery findings. Portal vein and SMV not visualized in this exam. IVC and splenic vein are patent.  *See table(s) above for measurements and observations.  Diagnosing physician: Monica Martinez MD  Electronically signed by Monica Martinez MD on 12/18/2020 at 7:12:49 PM.    Final    US LIVER DOPPLER LIMITED (  PV OR SINGLE ART/VEIN)  Result Date: 12/19/2020 CLINICAL DATA:  Portal vein thrombosis, colorectal carcinoma stage IV EXAM: DUPLEX ULTRASOUND OF LIVER LIMITED TECHNIQUE: Limited color and duplex Doppler ultrasound was performed to evaluate the portal vein. COMPARISON:  CT 12/03/2020 and previous FINDINGS: Liver: Normal parenchymal echogenicity. Normal hepatic contour without nodularity. No focal lesion, mass or intrahepatic biliary ductal dilatation. Main Portal Vein size: 1.4 cm Portal Vein Velocities (all hepatopetal with monophasic waveform): Main  :  21 cm/sec Right: 24 cm/sec Left: 17 cm/sec IVC: Intrahepatic segment patent. Portal Vein Occlusion/Thrombus: No Ascites: Small volume perihepatic Varices: None Incidental note made of dilated CBD up to 10 mm diameter, stable by my measurement compared to prior CT. IMPRESSION: 1. Patent portal vein and main intrahepatic branches with antegrade monophasic flow signal. No evidence of portal vein thrombosis. 2. Perihepatic ascites as before 3. Stable dilatation of the CBD. Electronically Signed   By: Lucrezia Europe M.D.   On: 12/19/2020 08:20      Armandina Gemma 12/20/2020   Patient ID: [name], female   DOB: 1965-10-10, 55 y.o.   MRN: [id number]

## 2020-12-20 NOTE — Progress Notes (Signed)
Daily Progress Note   Patient Name: Tina Morales       Date: 12/20/2020 DOB: [birth year]  Age: 55 y.o. MRN#: [id number] Attending Physician: Nolon Nations, MD Primary Care Physician: Ladell Pier, MD Admit Date: 12/17/2020  Reason for Consultation/Follow-up: pain control and goals of care.   Subjective:  Awake alert, resting in bed.  Discussed with bedside RN.  PCA pump interrogated.  Patient has used 9.5 mg of Dilaudid in the last 24 hours.  Patient being encouraged to use the Dilaudid PCA bolus dosage on an as-needed basis.  She states that she would like to discuss further with Dr. Benay Spice about the trajectory of her illness as well as neck steps in treatment early next week when he is available.  Discussed with her that for this weekend, let us work on prioritizing pain and non pain symptom management, try to improve her sleep and her p.o. intake over the weekend and to revisit goals of care discussions after she has an opportunity to discuss with oncology as per her preference.    Length of Stay: 3  Current Medications: Scheduled Meds:   Chlorhexidine Gluconate Cloth  6 each Topical Daily   feeding supplement  1 Container Oral TID BM   feeding supplement  237 mL Oral BID BM   HYDROmorphone   Intravenous Q4H   oxyCODONE  10 mg Oral Q12H   pantoprazole  40 mg Oral QHS   polycarbophil  625 mg Oral Daily   potassium chloride SA  20 mEq Oral BID    Continuous Infusions:  cefTRIAXone (ROCEPHIN)  IV 1 g (12/19/20 1751)   dextrose 5 % and 0.9 % NaCl with KCl 20 mEq/L 75 mL/hr at 12/19/20 1516    PRN Meds: acetaminophen **OR** acetaminophen, ALPRAZolam, diphenhydrAMINE **OR** diphenhydrAMINE, diphenhydrAMINE **OR** diphenhydrAMINE, metoprolol tartrate, naloxone **AND** sodium chloride flush,  ondansetron **OR** ondansetron (ZOFRAN) IV, ondansetron (ZOFRAN) IV, prochlorperazine **OR** prochlorperazine, simethicone  Physical Exam         Awake alert No distress Using PCA on an as needed basis Regular work of breathing Abdomen mildly distended No edema Some frailty and generalized weakness  Vital Signs: BP 97/71 (BP Location: Left Arm)   Pulse (!) 105   Temp 97.9 F (36.6 C) (Oral)   Resp 16   Ht 5\' 8"  (1.727 m)  Wt 59.7 kg   SpO2 99%   BMI 20.01 kg/m  SpO2: SpO2: 99 % O2 Device: O2 Device: Nasal Cannula O2 Flow Rate: O2 Flow Rate (L/min): 2 L/min  Intake/output summary: No intake or output data in the 24 hours ending 12/20/20 1226  LBM: Last BM Date: 12/19/20 Baseline Weight: Weight: 59.7 kg Most recent weight: Weight: 59.7 kg      PPS 50% Palliative Assessment/Data:      Patient Active Problem List   Diagnosis Date Noted   Goals of care, counseling/discussion    General weakness    Generalized pain    Vaginal pain 12/17/2020   Cancer associated pain    Palliative care by specialist    Abdominal pain 12/02/2020   Diarrhea 11/26/2020   Colorectal cancer ([place]) 11/18/2020   Hypokalemia 11/18/2020   Elevated blood pressure reading without diagnosis of hypertension 11/18/2020   Dehiscence of perineal wound 10/04/2018   Wound infection 10/04/2018   Port-A-Cath in place 07/07/2018   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   Recommendations/Plan:  Continue PCA Briefly started code status and  goals of care discussions with patient, she would like to continue these discussions with oncology as well. PMT to follow up early next week as well. Continue current mode of care.   Goals of Care and Additional Recommendations: Limitations on Scope of Treatment: Full Scope Treatment  Code Status:    Code Status Orders  (From admission, onward)           Start     Ordered   12/17/20 1855  Full code  Continuous        12/17/20 1856           Code Status History     Date Active Date Inactive Code Status Order ID Comments User Context   11/26/2020 1621 12/06/2020 1630 Full Code [id number]  Kayleen Memos, DO Inpatient   11/18/2020 2126 11/20/2020 1815 Full Code [id number]  Chotiner, Yevonne Aline, MD Inpatient   10/04/2018 1754 10/06/2018 1649 Full Code [id number]  Kinsinger, Arta Bruce, MD Inpatient   10/04/2018 1633 10/04/2018 1731 Full Code [id number]  Lorette Ang Outpatient   09/01/2018 0615 09/05/2018 1437 Full Code [id number]  Dorothyann Gibbs, NP Inpatient       Prognosis:  Unable to determine  Discharge Planning: To Be Determined  Care plan was discussed with patient and bedside RN.   Thank you for allowing the Palliative Medicine Team to assist in the care of this patient.   Time In: 11 Time Out: 11.25 Total Time 25 Prolonged Time Billed  no       Greater than 50%  of this time was spent counseling and coordinating care related to the above assessment and plan.  Loistine Chance, MD  Please contact Palliative Medicine Team phone at [phone] for questions and concerns.

## 2020-12-21 DIAGNOSIS — N824 Other female intestinal-genital tract fistulae: Secondary | ICD-10-CM | POA: Diagnosis present

## 2020-12-21 NOTE — Progress Notes (Signed)
Dr Harlow Asa notified per patient request concerning a new opening in the perineal area where she previously had surgery. The area is leaking stool. No new orders given. Will continue to monitor.

## 2020-12-21 NOTE — Progress Notes (Signed)
Daily Progress Note   Patient Name: Tina Morales       Date: 12/21/2020 DOB: [birth year]  Age: 55 y.o. MRN#: [id number] Attending Physician: Nolon Nations, MD Primary Care Physician: Ladell Pier, MD Admit Date: 12/17/2020  Reason for Consultation/Follow-up: pain control and goals of care.   Subjective:  Awake alert, resting in bed.  Discussed with bedside RN as well as husband who is present in the room, remains on PCA bolus dosage, has used around 30 mg of Dilaudid in the last 24 hours, patient's pain is overall reasonably well controlled.  Ongoing goals of care discussions with patient as well as her husband was also present at the bedside.     Length of Stay: 4  Current Medications: Scheduled Meds:   Chlorhexidine Gluconate Cloth  6 each Topical Daily   feeding supplement  1 Container Oral TID BM   feeding supplement  237 mL Oral BID BM   HYDROmorphone   Intravenous Q4H   oxyCODONE  10 mg Oral Q12H   pantoprazole  40 mg Oral QHS   polycarbophil  625 mg Oral Daily   potassium chloride SA  20 mEq Oral BID    Continuous Infusions:  cefTRIAXone (ROCEPHIN)  IV 1 g (12/20/20 1649)   dextrose 5 % and 0.9 % NaCl with KCl 20 mEq/L 75 mL/hr at 12/21/20 1118    PRN Meds: acetaminophen **OR** acetaminophen, ALPRAZolam, diphenhydrAMINE **OR** diphenhydrAMINE, diphenhydrAMINE **OR** diphenhydrAMINE, metoprolol tartrate, naloxone **AND** sodium chloride flush, ondansetron **OR** ondansetron (ZOFRAN) IV, ondansetron (ZOFRAN) IV, prochlorperazine **OR** prochlorperazine, simethicone  Physical Exam         Awake alert No distress Using PCA on an as needed basis Regular work of breathing Abdomen mildly distended No edema Some frailty and generalized weakness  Vital Signs: BP 101/73 (BP  Location: Left Arm)   Pulse (!) 110   Temp 97.6 F (36.4 C) (Oral)   Resp 18   Ht 5\' 8"  (1.727 m)   Wt 59.7 kg   SpO2 100%   BMI 20.01 kg/m  SpO2: SpO2: 100 % O2 Device: O2 Device: Nasal Cannula O2 Flow Rate: O2 Flow Rate (L/min): 2 L/min  Intake/output summary:  Intake/Output Summary (Last 24 hours) at 12/21/2020 1244 Last data filed at 12/21/2020 1118 Gross per 24 hour  Intake 120 ml  Output --  Net 120 ml    LBM: Last BM Date: 12/19/20 Baseline Weight: Weight: 59.7 kg Most recent weight: Weight: 59.7 kg      PPS 50% Palliative Assessment/Data:      Patient Active Problem List   Diagnosis Date Noted   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   Goals of care, counseling/discussion    General weakness    Generalized pain    Vaginal pain 12/17/2020   Cancer associated pain    Palliative care by specialist    Abdominal pain 12/02/2020   Diarrhea 11/26/2020   Colorectal cancer ([place]) 11/18/2020   Hypokalemia 11/18/2020   Elevated blood pressure reading without diagnosis of hypertension 11/18/2020   Dehiscence of perineal wound 10/04/2018   Wound infection 10/04/2018   Port-A-Cath in place 07/07/2018   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   Recommendations/Plan:  Continue PCA Briefly started code status and goals of care discussions with patient, she would like to continue these discussions with oncology as well. PMT to follow up early next week as well. Continue current mode of care.   Goals of Care and  Additional Recommendations: Limitations on Scope of Treatment: Full Scope Treatment  Code Status:    Code Status Orders  (From admission, onward)           Start     Ordered   12/17/20 1855  Full code  Continuous        12/17/20 1856           Code Status History     Date Active Date Inactive Code Status Order ID Comments User Context   11/26/2020 1621 12/06/2020 1630 Full Code [id number]  Kayleen Memos, DO Inpatient   11/18/2020 2126 11/20/2020 1815 Full Code [id number]  Chotiner, Yevonne Aline, MD Inpatient   10/04/2018 1754 10/06/2018 1649 Full Code [id number]  Kinsinger, Arta Bruce, MD Inpatient   10/04/2018 1633 10/04/2018 1731 Full Code [id number]  Lorette Ang Outpatient   09/01/2018 0615 09/05/2018 1437 Full Code [id number]  Dorothyann Gibbs, NP Inpatient       Prognosis:  Guarded   Discharge Planning: To Be Determined  Care plan was discussed with patient and bedside RN.   Thank you for allowing the Palliative Medicine Team to assist in the care of this patient.   Time In: 12 Time Out: 12.25 Total Time 25 Prolonged Time Billed  no       Greater than 50%  of this time was spent counseling and coordinating care related to the above assessment and plan.  Loistine Chance, MD  Please contact Palliative Medicine Team phone at [phone] for questions and concerns.

## 2020-12-21 NOTE — Progress Notes (Signed)
Assessment & Plan: HD#4   Anxiety-continue home xanax GERD-PPI   UTI IV rocephin purwick is helping with urinary frequency  WBC 23 - will monitor - ?another source  C diff negative, GI panel negative Enterovaginal fistula  Will ask WOC nurse to see - barrier cream? Other? Metastatic rectal cancer on chemotherapy S/p APR with end colostomy (Dr. Dema Severin) and radical vaginectomy (Dr. Denman George) 09/01/2018  Liver duplex negative for portal vein thrombus. Suspect much of the radiographic appearance is due to progressive malignancy/hypoproteinemia.   Agree with plan as outlined by Dr. Kae Heller - "Enterovaginal fistula -continue fiber supplement, may titrate other meds to thicken up her small bowel contents and hopefully decrease the output. Difficult to quantify. This is not going to be surgically correctable given peritoneal carcinomatosis, malignant (?) ascites and radiated pelvis. Attempting to repair this transabdominally will almost certainly create multiple enterotomies/other fistulae/ increased suffering for the patient. 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  Appreciate care from palliative service and oncology.  Dr. Benay Spice and Dr. Dema Severin back on Monday, 6/13, and will ask to see and guide management.   ID - none VTE - lovenox FEN - full liquid diet Foley - purewick        Tina Gemma, MD       [facility]       Office: [phone]   Chief Complaint: Enterovaginal fistula, metastatic carcinoma  Subjective: Patient in bed, frequent output from fistula causing pain.  Full liquid diet.  Objective: Vital signs in last 24 hours: Temp:  [97.4 F (36.3 C)-98.6 F (37 C)] 97.4 F (36.3 C) (06/12 0621) Pulse Rate:  [105-133] 122 (06/12 0621) Resp:  [14-18] 16 (06/12 0621) BP: (97-133)/(67-91) 127/82 (06/12  0621) SpO2:  [94 %-100 %] 100 % (06/12 0621) FiO2 (%):  [0 %] 0 % (06/11 1920) Last BM Date: 12/19/20  Intake/Output from previous day: 06/11 0701 - 06/12 0700 In: 240 [P.O.:240] Out: -  Intake/Output this shift: No intake/output data recorded.  Physical Exam: HEENT - sclerae clear, mucous membranes moist Neck - soft Abdomen - softer, less distended; no output from colostomy Neuro - alert & oriented, no focal deficits  Lab Results:  Recent Labs    12/19/20 0459  WBC 23.3*  HGB 9.8*  HCT 31.3*  PLT 310   BMET Recent Labs    12/18/20 0842 12/19/20 0459  NA  --  129*  K 2.8* 3.8  CL  --  94*  CO2  --  28  GLUCOSE  --  119*  BUN  --  6  CREATININE  --  0.39*  CALCIUM  --  7.7*   PT/INR No results for input(s): LABPROT, INR in the last 72 hours. Comprehensive Metabolic Panel:    Component Value Date/Time   NA 129 (L) 12/19/2020 0459   NA 131 (L) 12/17/2020 2052   K 3.8 12/19/2020 0459   K 2.8 (L) 12/18/2020 0842   CL 94 (L) 12/19/2020 0459   CL 89 (L) 12/17/2020 2052   CO2 28 12/19/2020 0459   CO2 32 12/17/2020 2052   BUN 6 12/19/2020 0459   BUN 7 12/17/2020 2052   CREATININE 0.39 (L) 12/19/2020 0459   CREATININE 0.44 12/17/2020 2052   CREATININE 0.38 (L) 11/26/2020 0919   CREATININE 0.32 (L) 11/05/2020 0930   GLUCOSE 119 (H) 12/19/2020 0459  GLUCOSE 130 (H) 12/17/2020 2052   CALCIUM 7.7 (L) 12/19/2020 0459   CALCIUM 7.8 (L) 12/17/2020 2052   AST 34 12/17/2020 2052   AST 24 12/04/2020 0533   AST 33 11/26/2020 0919   AST 46 (H) 11/05/2020 0930   ALT 11 12/17/2020 2052   ALT 11 12/04/2020 0533   ALT 10 11/26/2020 0919   ALT 30 11/05/2020 0930   ALKPHOS 157 (H) 12/17/2020 2052   ALKPHOS 179 (H) 12/04/2020 0533   BILITOT 1.0 12/17/2020 2052   BILITOT 1.0 12/04/2020 0533   BILITOT 1.0 11/26/2020 0919   BILITOT 0.7 11/05/2020 0930   PROT 5.5 (L) 12/17/2020 2052   PROT 5.0 (L) 12/04/2020 0533   ALBUMIN 2.4 (L) 12/17/2020 2052   ALBUMIN 2.4 (L)  12/04/2020 0533    Studies/Results: No results found.    Tina Morales 12/21/2020   Patient ID: Tina Morales, female   DOB: 10-Jun-1966, 55 y.o.   MRN: [id number]

## 2020-12-22 DIAGNOSIS — R102 Pelvic and perineal pain: Secondary | ICD-10-CM

## 2020-12-22 DIAGNOSIS — N824 Other female intestinal-genital tract fistulae: Principal | ICD-10-CM

## 2020-12-22 LAB — BASIC METABOLIC PANEL
Anion gap: 4 — ABNORMAL LOW (ref 5–15)
BUN: <5 mg/dL — ABNORMAL LOW (ref 6–20)
CO2: 25 mmol/L (ref 22–32)
Calcium: 7.8 mg/dL — ABNORMAL LOW (ref 8.9–10.3)
Chloride: 104 mmol/L (ref 98–111)
Creatinine, Ser: 0.37 mg/dL — ABNORMAL LOW (ref 0.44–1.00)
GFR, Estimated: >60 mL/min (ref >60)
Glucose, Bld: 119 mg/dL — ABNORMAL HIGH (ref 70–99)
Potassium: 5.5 mmol/L — ABNORMAL HIGH (ref 3.5–5.1)
Sodium: 133 mmol/L — ABNORMAL LOW (ref 135–145)

## 2020-12-22 LAB — CBC
HCT: 30.4 % — ABNORMAL LOW (ref 36.0–46.0)
Hemoglobin: 9.3 g/dL — ABNORMAL LOW (ref 12.0–15.0)
MCH: 22 pg — ABNORMAL LOW (ref 26.0–34.0)
MCHC: 30.6 g/dL (ref 30.0–36.0)
MCV: 72 fL — ABNORMAL LOW (ref 80.0–100.0)
Platelets: 280 10*3/uL (ref 150–400)
RBC: 4.22 MIL/uL (ref 3.87–5.11)
RDW: 20.6 % — ABNORMAL HIGH (ref 11.5–15.5)
WBC: 18.3 10*3/uL — ABNORMAL HIGH (ref 4.0–10.5)
nRBC: 0.3 % — ABNORMAL HIGH (ref 0.0–0.2)

## 2020-12-22 MED ORDER — ZINC OXIDE 12.8 % EX OINT
TOPICAL_OINTMENT | CUTANEOUS | Status: DC | PRN
  Filled 2020-12-22: qty 56.7

## 2020-12-22 MED ORDER — OXYCODONE HCL ER 20 MG PO T12A
20.0000 mg | EXTENDED_RELEASE_TABLET | Freq: Two times a day (BID) | ORAL | Status: DC
  Administered 2020-12-22 – 2020-12-23 (×4): 20 mg via ORAL
  Filled 2020-12-22 (×4): qty 1

## 2020-12-22 MED ORDER — HYDROMORPHONE 1 MG/ML IV SOLN
INTRAVENOUS | Status: DC
Start: 2020-12-22 — End: 2020-12-24
  Administered 2020-12-22: 30 mg via INTRAVENOUS
  Administered 2020-12-23: 5.5 mg via INTRAVENOUS
  Administered 2020-12-23: 30 mg via INTRAVENOUS
  Administered 2020-12-23 (×2): 4 mg via INTRAVENOUS
  Administered 2020-12-23: 3 mg via INTRAVENOUS
  Filled 2020-12-22: qty 30

## 2020-12-22 MED ORDER — DEXTROSE-NACL 5-0.9 % IV SOLN: INTRAVENOUS | Status: DC

## 2020-12-22 NOTE — Consult Note (Addendum)
WOC Nurse Consult Note: Patient receiving care in [place]. Reason for Consult: enterovaginal fistula causing perineal skin breakdown Wound type: MASD-fistula drainage causation Pressure Injury POA: Yes/No/NA Measurement: Wound bed: Drainage (amount, consistency, odor)  Periwound: Dressing procedure/placement/frequency: I have ordered prn application of triple paste for the area.  Dr. Deland Pretty talking with patient and wanted a barrier cream for the area.  Area not evaluated at MD request. [place] nurse will not follow at this time.  Please re-consult the [place] team if needed.  Val Riles, RN, MSN, CWOCN, CNS-BC, pager [phone]

## 2020-12-22 NOTE — Progress Notes (Signed)
Daily Progress Note   Patient Name: Tina Morales       Date: 12/22/2020 DOB: Dec 02, 1965  Age: 55 y.o. MRN#: [id number] Attending Physician: Nolon Nations, MD Primary Care Physician: Ladell Pier, MD Admit Date: 12/17/2020  Reason for Consultation/Follow-up: pain control and goals of care.   Subjective: Overall, patient is awake, able to participate in conversations, is getting sleepy at times.  Husband present at bedside.  The discussed with oncology earlier today.  I continued CODE STATUS and goals of care discussions and we also talked about different levels of hospice support, see below      Length of Stay: 5  Current Medications: Scheduled Meds:   Chlorhexidine Gluconate Cloth  6 each Topical Daily   feeding supplement  1 Container Oral TID BM   feeding supplement  237 mL Oral BID BM   HYDROmorphone   Intravenous Q4H   oxyCODONE  20 mg Oral Q12H   pantoprazole  40 mg Oral QHS   polycarbophil  625 mg Oral Daily   potassium chloride SA  20 mEq Oral BID    Continuous Infusions:  cefTRIAXone (ROCEPHIN)  IV 1 g (12/21/20 1658)   dextrose 5 % and 0.9% NaCl 50 mL/hr at 12/22/20 1128    PRN Meds: acetaminophen **OR** acetaminophen, ALPRAZolam, diphenhydrAMINE **OR** diphenhydrAMINE, diphenhydrAMINE **OR** diphenhydrAMINE, metoprolol tartrate, naloxone **AND** sodium chloride flush, ondansetron **OR** ondansetron (ZOFRAN) IV, ondansetron (ZOFRAN) IV, prochlorperazine **OR** prochlorperazine, simethicone, Zinc Oxide  Physical Exam         Reasonably awake alert No distress Regular work of breathing Abdomen mildly distended No edema Some frailty and generalized weakness  Vital Signs: BP 118/78   Pulse (!) 115   Temp 98.3 F (36.8 C) (Oral)   Resp 20   Ht 5\' 8"  (1.727 m)   Wt 59.7  kg   SpO2 94%   BMI 20.01 kg/m  SpO2: SpO2: 94 % O2 Device: O2 Device: Room Air O2 Flow Rate: O2 Flow Rate (L/min): 0 L/min  Intake/output summary:  Intake/Output Summary (Last 24 hours) at 12/22/2020 1214 Last data filed at 12/22/2020 0850 Gross per 24 hour  Intake 120 ml  Output --  Net 120 ml    LBM: Last BM Date: 12/19/20 Baseline Weight: Weight: 59.7 kg Most recent weight: Weight: 59.7 kg  PPS 50% Palliative Assessment/Data:      Patient Active Problem List   Diagnosis Date Noted   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   Goals of care, counseling/discussion    General weakness    Generalized pain    Vaginal pain 12/17/2020   Cancer associated pain    Palliative care by specialist    Abdominal pain 12/02/2020   Diarrhea 11/26/2020   Colorectal cancer ([place]) 11/18/2020   Hypokalemia 11/18/2020   Elevated blood pressure reading without diagnosis of hypertension 11/18/2020   Dehiscence of perineal wound 10/04/2018   Wound infection 10/04/2018   Port-A-Cath in place 07/07/2018   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   Recommendations/Plan:  Slightly adjust her PCA since patient is getting sleepy at times.  Tina Morales and her husband discuss further with oncology today regarding the current state of her cancer.  Dr. Benay Spice and NP Erasmo Downer followed up with the patient today.  It has been recommended for focusing on comfort, likely no further benefit from systemic chemotherapy and for transitioning to hospice  support.  Hence, we discussed further about CODE STATUS and goals of care going forward.  Discussed extensively about differences between full code versus DO NOT RESUSCITATE.  Shared frankly and compassionately about medical recommendation for consideration of DO NOT RESUSCITATE/DO NOT INTUBATE.  Patient agrees, husband is also agreeable, will change CODE STATUS to DNR in the patient's chart.  We also discussed about main differences between home with hospice versus residential hospice.  Patient lives in [place], [place].  We talked about having adequate pain and on pain symptom management.  Patient would prefer to be home however is also open to getting more details about residential hospice.  She becomes very tearful and states that it is difficult for her to continue this conversation at which point I exited the room.  TOC consult has been placed for further assistance in getting the patient set up with hospice services.  Palliative medicine team will continue to follow.   Goals of Care and Additional Recommendations: Limitations on Scope of Treatment: DNR DNI. Comfort measures. Hospice.   Code Status: now DNR DNI as of 12-22-2020.     Code Status Orders  (From admission, onward)           Start     Ordered   12/17/20 1855  Full code  Continuous        12/17/20 1856           Code Status History     Date Active Date Inactive Code Status Order ID Comments User Context   11/26/2020 1621 12/06/2020 1630 Full Code [id number]  Kayleen Memos, DO Inpatient   11/18/2020 2126 11/20/2020 1815 Full Code [id number]  Chotiner, Yevonne Aline, MD Inpatient   10/04/2018 1754 10/06/2018 1649 Full Code [id number]  Kinsinger, Arta Bruce, MD Inpatient   10/04/2018 1633 10/04/2018 1731 Full Code [id number]  Lorette Ang Outpatient   09/01/2018 0615 09/05/2018 1437 Full Code [id number]  Dorothyann Gibbs, NP Inpatient  Prognosis:  Possibly 2 weeks.   Discharge Planning: Home  with hospice versus residential hospice.  Care plan was discussed with patient and husband, also corresponded briefly with medical oncology, appreciate their input.  Thank you for allowing the Palliative Medicine Team to assist in the care of this patient.   Time In: 11 Time Out: 11.35 Total Time 35 Prolonged Time Billed  no       Greater than 50%  of this time was spent counseling and coordinating care related to the above assessment and plan.  Loistine Chance, MD  Please contact Palliative Medicine Team phone at [phone] for questions and concerns.

## 2020-12-22 NOTE — Progress Notes (Signed)
Patient is due potassium supplement. Last K+ level was 5.5. On call provider contacted and informed of same. Advised to hold current dose.

## 2020-12-22 NOTE — Progress Notes (Addendum)
HEMATOLOGY-ONCOLOGY PROGRESS NOTE  SUBJECTIVE: [name] is followed by our office for rectal cancer.  Post recently, she is receiving chemotherapy with FOLFOX/Avastin.  Last cycle given on 11/05/2020.  She was recently admitted to the hospital from 11/26/2020 through 12/06/2020 for persistent abdominal pain and colon narrowing of to stoma.  She developed severe vaginal pain followed by passage of stool from her vagina which prompted admission.  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  General surgery is seeing the patient and unfortunately there are no good surgical options.  The patient was seen in her hospital room this morning.  Her husband joined [place] at the bedside.  She was tearful this morning.  She reported significant pain in her perineal area.  She is persistently oozing urine mixed with stool.  Water barrier type paste being used to prevent skin maceration from vaginal drainage.  On Rocephin for UTI.  Oncology History  Rectal cancer ([place])  04/27/2018 Initial Diagnosis   Rectal cancer ([place])    05/11/2018 -  Chemotherapy    Patient is on Treatment Plan: COLORECTAL FOLFOX + ZIRABEV Q 21 DAYS       06/13/2019 - 10/15/2020 Chemotherapy    Patient is on Treatment Plan: COLORECTAL FOLFOX + ZIRABEV Q 21 DAYS         PHYSICAL EXAMINATION:  Vitals:   12/22/20 0620 12/22/20 0748  BP: 105/78 110/82  Pulse: (!) 119 (!) 127  Resp: 20 20  Temp: 98.2 F (36.8 C) 98.2 F (36.8 C)  SpO2: 96% 96%   Filed Weights   12/17/20 1926  Weight: 59.7 kg    Intake/Output from previous day: 06/12 0701 - 06/13 0700 In: 120 [P.O.:120] Out: -   GENERAL: Alert, tearful LUNGS: clear to auscultation and percussion with normal breathing effort HEART: regular rate & rhythm and no murmurs and no lower extremity edema ABDOMEN: Soft, mildly tender with  palpation, ostomy in place NEURO: alert & oriented x 3 with fluent speech, no focal motor/sensory deficits  Port-A-Cath without erythema  LABORATORY DATA:  I have reviewed the data as listed CMP Latest Ref Rng & Units 12/19/2020 12/18/2020 12/17/2020  Glucose 70 - 99 mg/dL 119(H) - 130(H)  BUN 6 - 20 mg/dL 6 - 7  Creatinine 0.44 - 1.00 mg/dL 0.39(L) - 0.44  Sodium 135 - 145 mmol/L 129(L) - 131(L)  Potassium 3.5 - 5.1 mmol/L 3.8 2.8(L) 2.3(LL)  Chloride 98 - 111 mmol/L 94(L) - 89(L)  CO2 22 - 32 mmol/L 28 - 32  Calcium 8.9 - 10.3 mg/dL 7.7(L) - 7.8(L)  Total Protein 6.5 - 8.1 g/dL - - 5.5(L)  Total Bilirubin 0.3 - 1.2 mg/dL - - 1.0  Alkaline Phos 38 - 126 U/L - - 157(H)  AST 15 - 41 U/L - - 34  ALT 0 - 44 U/L - - 11    Lab Results  Component Value Date   WBC 23.3 (H) 12/19/2020   HGB 9.8 (L) 12/19/2020   HCT 31.3 (L) 12/19/2020   MCV 69.4 (L) 12/19/2020   PLT 310 12/19/2020   NEUTROABS 16.0 (H) 12/17/2020    CT ABDOMEN PELVIS WO CONTRAST  Result Date: 12/18/2020 CLINICAL DATA:  Feculent vaginal discharge, leukocytosis EXAM: CT ABDOMEN AND PELVIS WITHOUT CONTRAST TECHNIQUE: Multidetector CT imaging of the abdomen and pelvis was performed following the standard protocol without IV contrast. COMPARISON:  12/03/2020 FINDINGS: Lower chest: Multiple pulmonary nodules are again  identified within the visualized lung bases, stable since prior examination, several of which demonstrate cavitation and are compatible with bilateral pulmonary metastases. Moderate coronary artery calcification. Trace pericardial effusion is stable. Cardiac size within normal limits. Hepatobiliary: No focal liver abnormality is seen. No gallstones, gallbladder wall thickening, or biliary dilatation. Pancreas: Unremarkable Spleen: Unremarkable Adrenals/Urinary Tract: The adrenal glands are unremarkable. The kidneys are normal in size and position. No intrarenal or ureteral calculi are seen. There is no hydronephrosis. The  bladder is markedly distended, progressive since prior examination, suggesting changes of possible bladder outlet obstruction. Stomach/Bowel: Extensive peritoneal thickening and nodularity is again identified, best appreciated within the right upper quadrant and right mid abdomen, as well as progressive nodular omental thickening in this region in keeping with peritoneal carcinomatosis. There is moderate ascites again identified., slightly increased in volume since prior examination. Surgical changes of abdominal peroneal resection and left lower quadrant descending, and colostomy are identified. There has developed marked circumferential bowel wall thickening and pericolonic inflammatory stranding in keeping with changes of a diffuse infectious or inflammatory colitis such as pseudomembranous colitis. No pneumatosis or mesenteric or portal venous gas identified. No evidence of obstruction. There is, additionally, diffuse mucosal fold thickening involving multiple loops of small bowel as well as the a duodenum. The findings can be seen in setting of hypoproteinemia with resultant bowel edema, or venous obstruction with resultant venous congestion. A infectious or inflammatory enteritis is possible, but considered less likely given the extent and uniformity of the abnormality. These findings could, altogether, also be seen in the setting of intraperitoneal chemotherapy administration if there has been a history of such intervention. Enteric contrast is seen extending into multiple loops within the pelvis. There is a leak identified involving a single loop of small bowel posterior to the a vaginal cuff, best seen on axial image # 75 with extension of enteric contrast into the presacral space, and ultimately communicating with the vagina, best seen on axial image # 83. The cyst is seen altogether on sagittal image # 49. No evidence of obstruction.  No free intraperitoneal gas. Vascular/Lymphatic: There is moderate  aortoiliac atherosclerotic calcification. No aortic aneurysm. No pathologic adenopathy within the abdomen and pelvis. Reproductive: Uterus absent.  No adnexal masses. Other: Mild developing subcutaneous edema within the flanks bilaterally in keeping with mild anasarca. Musculoskeletal: No acute bone abnormality. No lytic or blastic bone lesions. IMPRESSION: Interval development of marked pancolonic bowel wall thickening and mild pericolonic inflammatory stranding most suggestive of an infectious or inflammatory colitis such as pseudomembranous colitis. Superimposed long segment mucosal fold thickening involving the duodenum and proximal and mid small bowel. The stented disease favors a more diffuse process, as can be seen with hypoproteinemia and resultant bowel edema or, more likely, venous obstruction and resultant venous congestion of the bowel. Contrast enhanced examination or abdominal Doppler sonography with particular attention to the portal vein may be helpful to assess patency of the portal mesenteric system. Alternatively, these findings could be seen in the setting of intraperitoneal chemotherapy administration and correlation with the patient's clinical history would be helpful. 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 Progressive peritoneal thickening and peritoneal and omental nodularity in keeping with peritoneal carcinomatosis. Mild interval increase in ascites. Bibasilar pulmonary nodules in keeping with pulmonary metastatic disease. Electronically Signed   By: Fidela Salisbury MD   On: 12/18/2020 00:01   CT ABDOMEN PELVIS W CONTRAST  Result Date: 12/03/2020 CLINICAL DATA:  Abdominal  distension. History of metastatic rectal cancer. EXAM: CT ABDOMEN AND PELVIS WITH CONTRAST TECHNIQUE: Multidetector CT imaging of the abdomen and pelvis was performed using the standard protocol following bolus administration of  intravenous contrast. CONTRAST:  115m OMNIPAQUE IOHEXOL 300 MG/ML  SOLN COMPARISON:  Nov 18, 2020. FINDINGS: Lower chest: Pulmonary nodules are noted in the visualized lung bases concerning for metastatic disease. Hepatobiliary: No focal liver abnormality is seen. No gallstones, gallbladder wall thickening, or biliary dilatation. Pancreas: Unremarkable. No pancreatic ductal dilatation or surrounding inflammatory changes. Spleen: Normal in size without focal abnormality. Adrenals/Urinary Tract: Adrenal glands are unremarkable. Kidneys are normal, without renal calculi, focal lesion, or hydronephrosis. Bladder is unremarkable. Stomach/Bowel: The stomach is unremarkable. Mildly dilated small bowel loops are noted. Colostomy is again noted in the left lower quadrant. Increased dilatation of the colon is noted which extends to the colostomy, concerning for distal colonic obstruction. Potentially this may be related to inflammation of the colostomy itself. Vascular/Lymphatic: Aortic atherosclerosis. No enlarged abdominal or pelvic lymph nodes. Reproductive: Status post hysterectomy. No adnexal masses. Other: Mild ascites is seen in the pelvis as well as around the liver and spleen. Subtle peritoneal wall thickening is seen posterior to inferior tip of right hepatic lobe; peritoneal implant or carcinomatosis cannot be excluded. Musculoskeletal: No acute or significant osseous findings. IMPRESSION: Pulmonary nodules are again noted in the visualized lung bases concerning for metastatic disease. Mild small bowel dilatation is noted, as well as increased dilatation of the colon which extends up to the colostomy in the left lower quadrant, concerning for distal colonic obstruction. Potentially this may related to inflammation of the colostomy itself. Mild ascites is noted in the pelvis as well as around the liver and spleen. Subtle peritoneal wall thickening is seen posterior to inferior tip of right hepatic lobe; peritoneal  implant or carcinomatosis cannot be excluded. Electronically Signed   By: JMarijo ConceptionM.D.   On: 12/03/2020 20:05   DG Abd 2 Views  Result Date: 11/28/2020 CLINICAL DATA:  Colon carcinoma.  Abdominal pain EXAM: ABDOMEN - 2 VIEW COMPARISON:  CT abdomen and pelvis Nov 18, 2020 FINDINGS: Supine and upright images were obtained. There is mild stool volume in the colon. No appreciable bowel dilatation or air-fluid levels to suggest bowel obstruction. No free air. There is mild left base atelectasis. Lung bases otherwise clear. Bibasilar nodular opacities consistent with metastatic foci. IMPRESSION: No evident bowel obstruction or free air. Mild left base atelectasis. Nodular opacities in the lung bases consistent with metastases, noted on recent CT as well. Electronically Signed   By: WLowella GripIII M.D.   On: 11/28/2020 11:13   VAS [place]ESENTERIC  Result Date: 12/18/2020 ABDOMINAL VISCERAL Patient Name:  Tina Morales  Date of Exam:   12/18/2020 Medical Rec #: [id number]  Accession #:    [id number] of Birth: 919-Nov-1967  Patient Gender: F Patient Age:   54Y [facility]      [facility] [place] Referring Phys: [id number]-------------------------------------------------------------------------------- Indications: Rule out PV and SMV thrombosis. Limitations: Air/bowel gas, patient discomfort and ascites. Comparison Study: No previous exams Performing Technologist: Jody Hill RVT, RDMS  Examination Guidelines: A complete evaluation includes B-mode imaging, spectral Doppler, color Doppler, and power Doppler as needed of all accessible portions of each vessel. Bilateral testing is considered an integral part of a complete examination. Limited examinations for reoccurring indications may be performed as noted.  Duplex Findings: +--------------------+--------+--------+------+--------+ Mesenteric  PSV cm/sEDV cm/sPlaqueComments  +--------------------+--------+--------+------+--------+ Aorta Prox             77      17                  +--------------------+--------+--------+------+--------+ Celiac Artery Origin  133      43                  +--------------------+--------+--------+------+--------+ SMA Proximal          132      35                  +--------------------+--------+--------+------+--------+ SMA Mid                88      20                  +--------------------+--------+--------+------+--------+ SMA Distal             88      23                  +--------------------+--------+--------+------+--------+    Summary: Mesenteric: Normal Celiac artery and Superior Mesenteric artery findings. Portal vein and SMV not visualized in this exam. IVC and splenic vein are patent.  *See table(s) above for measurements and observations.  Diagnosing physician: Monica Martinez MD  Electronically signed by Monica Martinez MD on 12/18/2020 at 7:12:49 PM.    Final    [place] ASCITES (ABDOMEN LIMITED)  Result Date: 12/01/2020 CLINICAL DATA:  Colorectal cancer, abdominal distension, assess for therapeutic paracentesis EXAM: LIMITED ABDOMEN ULTRASOUND FOR ASCITES TECHNIQUE: Limited ultrasound survey for ascites was performed in all four abdominal quadrants. COMPARISON:  11/18/2020 FINDINGS: Survey ultrasound performed of the abdominal 4 quadrants. Small amount of right upper quadrant perihepatic ascites. No large volume ascites appreciated that warrants therapeutic paracentesis. Procedure not performed. IMPRESSION: Small amount of right upper quadrant perihepatic ascites. Electronically Signed   By: Jerilynn Mages.  Shick M.D.   On: 12/01/2020 12:05   US LIVER DOPPLER LIMITED (PV OR SINGLE ART/VEIN)  Result Date: 12/19/2020 CLINICAL DATA:  Portal vein thrombosis, colorectal carcinoma stage IV EXAM: DUPLEX ULTRASOUND OF LIVER LIMITED TECHNIQUE: Limited color and duplex Doppler ultrasound was performed to evaluate the portal vein.  COMPARISON:  CT 12/03/2020 and previous FINDINGS: Liver: Normal parenchymal echogenicity. Normal hepatic contour without nodularity. No focal lesion, mass or intrahepatic biliary ductal dilatation. Main Portal Vein size: 1.4 cm Portal Vein Velocities (all hepatopetal with monophasic waveform): Main  :  21 cm/sec Right: 24 cm/sec Left: 17 cm/sec IVC: Intrahepatic segment patent. Portal Vein Occlusion/Thrombus: No Ascites: Small volume perihepatic Varices: None Incidental note made of dilated CBD up to 10 mm diameter, stable by my measurement compared to prior CT. IMPRESSION: 1. Patent portal vein and main intrahepatic branches with antegrade monophasic flow signal. No evidence of portal vein thrombosis. 2. Perihepatic ascites as before 3. Stable dilatation of the CBD. Electronically Signed   By: Lucrezia Europe M.D.   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), arising in high-grade dysplasia, no loss of mismatch repair protein expression CTs 02/10/2018- anterior rectal wall thickening, pulmonary nodules measuring up to 9 mm concerning for metastases, few round perirectal lymph nodes Pelvic  MRI 3/54/5625- hypermetabolic low rectal mass extending to the posterior vagina with 2 small enlarged perirectal lymph nodes, T4b,N1- 2.3 cm from the anal verge PET scan 02/23/2018-hypermetabolic rectal mass, 8 mm hypermetabolic lingular nodule, scattered small bilateral lung nodules, some calcified, a few with mildly increased activity Cycle 1 FOLFOXIRI 03/01/2018 Cycle 5 FOLFOXIRI 04/26/2018 CTs 05/01/2018-significant interval response to therapy with decreased size of the primary rectal mass lesion.  Decreasing  perirectal lymphadenopathy.  Decreased and/or resolved pulmonary nodules.  No new sites of disease identified. Cycle 6 FOLFOXIRI 05/11/2018 Radiation/Xeloda 06/12/2018-completed 07/21/2018 Xeloda dose reduced 07/10/2018 due to mucositis and diarrhea CTs 08/07/2018- compared to 02/10/2018- resolved and decreased pulmonary nodules, few tiny residual noncalcified nodules, decreased 20 perirectal lymph nodes, soft tissue of the rectum indistinguishable from posterior wall of vagina-  Vaginal involvement by tumor? APR/vaginectomy 09/01/2018-ypT4,ypNo, negative resection margins, involvement of the vagina per review of slides at GI tumor conference, no treatment effect-tumor regression score 3, no loss of mismatch repair protein expression, MSI-stable, KRAS G12V K-ras G12V mutation CT chest 09/29/2018- enlargement of lung nodules, not a candidate for SBRT based on discussion in GI tumor conference and with radiation oncology CT chest 11/27/18 - interval growth of numerous pulmonary metastases bilaterally compared to 09/29/18, no new pulmonary mets  Cycle 1 FOLFOXIRI 11/29/18  Cycle 2 FOLFOXIRI 12/14/2018 Cycle 3 FOLFOXIRI 12/28/2018 Cycle 4 FOLFIRINOX 01/11/2019 CTs 01/24/2019-significant improvement of bilateral pulmonary metastases. Cycle 1 FOLFIRI 02/01/2019 Cycle 2 FOLFIRI 02/22/2019 Cycle 3 FOLFIRI 03/15/2019 Cycle 4 FOLFIRI 04/05/2019 Cycle 5 FOLFIRI 04/26/2019 CTs 05/14/2019-slight enlargement of right lung nodules, no other evidence of disease progression Cycle 6 FOLFIRI 05/17/2019 Cycle 7 FOLFIRI plus Avastin 06/13/2019 Cycle 8 FOLFIRI plus Avastin 07/09/2019 Cycle 9 FOLFIRI plus Avastin 07/25/2019 Cycle 10 FOLFIRI plus Avastin 08/09/2019 Cycle 11 FOLFIRI plus Avastin 08/22/2019 CTs 09/07/2019-most pulmonary nodules are stable.  1 nodule right lower lobe slightly larger.  No new lung lesions. Cycle 12 FOLFIRI plus Avastin 09/13/2019 Cycle 13 FOLFIRI plus Avastin 10/04/2019 Cycle 14 FOLFIRI plus Avastin  10/25/2019 Cycle 15 FOLFIRI plus Avastin 11/14/2019 Cycle 16 FOLFIRI plus Avastin 12/06/2019 CTs neck, chest, abdomen, pelvis 12/21/2019-neck negative; mildly progressive pulmonary metastases; abdomen and pelvis negative for evidence of metastatic disease. Cycle 1 FOLFOX/Avastin 12/27/2019 Cycle 2 FOLFOX/Avastin 01/17/2020 Cycle 3 FOLFOX/Avastin 02/07/2020 Cycle 4 FOLFOX 02/28/2020, Avastin held due to need for dental evaluation possible extractions Cycle 5 FOLFOX 03/20/2020, Avastin held pending dental evaluation CTs 04/04/2020-increased size of pulmonary nodules, no evidence of metastatic disease in the abdomen or pelvis Cycle 6 FOLFOX 05/22/2020, Avastin held secondary to dental extractions Cycle 7 FOLFOX/Avastin 06/12/2020 Cycle 8 FOLFOX/Avastin 06/30/2020 Cycle 9 FOLFOX/Avastin 07/24/2020 Cycle 10 FOLFOX/Avastin 08/14/2020 CTs 09/01/2020-improvement in bilateral pulmonary metastases. Cycle 11 FOLFOX/Avastin 09/04/2020 Cycle 12 FOLFOX/Avastin 09/25/2020 Cycle 13 FOLFOX/Avastin 10/15/2020 Cycle 14 FOLFOX/Avastin 11/05/2020 CTs 11/18/2020- stable lung nodules, no focal hepatic lesion, small amount of new pericholecystic fluid, mild submucosal edema and small bowel loops within the pelvis, fluid stool throughout the colon with submucosal edema and enhancement in the distal colon, new nodular enhancement at the posterior margin of the right liver CT 12/03/2020- pulmonary nodules again noted in the lung bases, mild small bowel dilatation and increased dilatation of the colon which extends up to the colostomy in the left lower quadrant concerning for distal colonic obstruction, mild ascites subtle peritoneal wall thickening seen in the posterior to inferior tip of the right hepatic lobe, peritoneal implant or carcinomatosis cannot be excluded.  CT 12/17/2020 -possible infectious or inflammatory colitis, enterovaginal fistula,  progressive peritoneal thickening and peritoneal and omental nodularity in keeping with peritoneal  carcinomatosis, mild interval increase in ascites, bibasilar pulmonary nodules 2.   History of diarrhea and rectal pain secondary to #1 3.   History of tobacco use 4.   Anemia secondary to thalassemia, rectal bleeding, and potentially  iron deficiency 5.   Diarrhea secondary to Xeloda and radiation.  Imodium as needed. 6.   Mucositis secondary to Xeloda.  Improved 07/19/2018. 7.   History of migraines 8.   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    Hypertension 10.  Oxaliplatin neuropathy    11.  Admission 11/18/2020 with nausea/vomiting, fever, and abdominal pain-CT consistent with enteritis/colitis 12.  Admission 11/26/2020 with nausea, abdominal pain, loose stool, and dehydration 13.  Hospital admission 11/26/2020- nausea, abdominal pain, and diarrhea 14.  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  Her CT scan results were reviewed and discussed with the patient and her husband.  Unfortunately, she has continued disease progression and evidence of peritoneal carcinomatosis.  Surgery is not indicated.  We discussed with the patient that systemic chemotherapy would likely not be effective.  Recommend focusing on comfort.  We discussed recommendation to transition to hospice upon hospital discharge.  The patient and her husband are agreeable.  TOC consult placed.  Dr. Benay Spice will be the hospice attending.  Pain not adequately controlled at this time.  She is on OxyContin 10 mg every 12 hours and Dilaudid PCA.  We will plan to increase OxyContin and continue Dilaudid PCA.  Palliative care is also assisting with pain management.  Recommendations: 1.  Increase OxyContin to 20 mg every 12 hours.  Continue Dilaudid PCA.  Palliative care assisting with pain management. 2.  TOC consult placed to arrange hospice in her home.   LOS: 5 days   Mikey Bussing, DNP, AGPCNP-BC, AOCNP 12/22/20 Tina Morales is now admitted with symptoms related to a  rectovaginal fistula and progressive carcinomatosis.  I reviewed the admission CT images chart notes, and discussed the findings with Tina Morales.  The clinical and radiologic findings are consistent with aggression of the metastatic rectal cancer.  She has progressive disease despite multiple systemic chemotherapy regimens.  She is not a candidate for an EGFR inhibitor.  I feel there would be limited benefit of additional systemic therapy.  I recommend hospice care.  She is in agreement.  We will titrate the narcotic analgesics in coordination with the palliative care service.  I will plan to follow her as an outpatient with the [facility] hospice program.

## 2020-12-22 NOTE — TOC Initial Note (Addendum)
Transition of Care [facility]) - Initial/Assessment Note    Patient Details  Name: Tina Morales MRN: [id number] Date of Birth: [birth year]  Transition of Care [facility]) CM/SW Contact:    Lennart Pall, LCSW Phone Number: 12/22/2020, 2:37 PM  Clinical Narrative:                  Met with pt and spouse this afternoon per referral to [facility] to assist with Hospice referral. Both are agreed with this plan and choose [facility] to provide services (they had received prior referral for Palliative care).  Pt and spouse report they prefer residential Hospice initially and then a return to her home when at end of life.  Encouraging them to discuss this more with Hospice liaison when contacted.   Placed referral with Cassandra at [facility] [phone]) and she will reach out to pt/ spouse.  TOC will continue to follow to assist with dc needs.  Addendum: Pt has been accepted into Hospice program/ services with [facility].  They have a residential bed available tomorrow for admission.  Will alert MDs, pt and spouse.  Expected Discharge Plan: [place] Barriers to Discharge: Continued Medical Work up   Patient Goals and CMS Choice Patient states their goals for this hospitalization and ongoing recovery are:: return home      Expected Discharge Plan and Services Expected Discharge Plan: [place] In-house Referral: Clinical Social Work   Post Acute Care Choice: Hospice Living arrangements for the past 2 months: [place] Agency: [facility]        Prior Living Arrangements/Services Living arrangements for the past 2 months: [place] Lives with:: Spouse Patient language and need for interpreter reviewed:: Yes Do you feel safe going back to the place where you live?: Yes      Need for Family Participation in Patient Care: Yes (Comment) Care giver support system in place?: Yes  (comment)   Criminal Activity/Legal Involvement Pertinent to Current Situation/Hospitalization: No - Comment as needed  Activities of Daily Living Home Assistive Devices/Equipment: Eyeglasses (reading glasses, chemo pump, colostomy supplies) ADL Screening (condition at time of admission) Patient's cognitive ability adequate to safely complete daily activities?: Yes Is the patient deaf or have difficulty hearing?: No Does the patient have difficulty seeing, even when wearing glasses/contacts?: No Does the patient have difficulty concentrating, remembering, or making decisions?: Yes (has "chemo brain") Patient able to express need for assistance with ADLs?: Yes Does the patient have difficulty dressing or bathing?: Yes Independently performs ADLs?: No Communication: Independent Dressing (OT): Independent Grooming: Independent Feeding: Independent Bathing: Independent Is this a change from baseline?: Pre-admission baseline Toileting: Needs assistance Is this a change from baseline?: Pre-admission baseline In/Out Bed: Needs assistance Is this a change from baseline?: Pre-admission baseline Walks in Home: Needs assistance Is this a change from baseline?: Pre-admission baseline Does the patient have difficulty walking or climbing stairs?: Yes Weakness of Legs: Both Weakness of Arms/Hands: Both  Permission Sought/Granted Permission sought to share information with : Family Supports Permission granted to share information with : Yes, Verbal Permission Granted  Share Information with NAME: Lynnex Fulp     Permission granted to share info w Relationship: spouse  Permission granted to share info w Contact Information: [phone]  Emotional Assessment Appearance:: Appears stated age  Attitude/Demeanor/Rapport: Gracious Affect (typically observed): Accepting Orientation: : Oriented to Self, Oriented to Place, Oriented to  Time, Oriented to Situation Alcohol / Substance Use: Not  Applicable Psych Involvement: No (comment)  Admission diagnosis:  Vaginal pain [R10.2] Patient Active Problem List   Diagnosis Date Noted   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   Goals of care, counseling/discussion    General weakness    Generalized pain    Vaginal pain 12/17/2020   Cancer associated pain    Palliative care by specialist    Abdominal pain 12/02/2020   Diarrhea 11/26/2020   Colorectal cancer ([place]) 11/18/2020   Hypokalemia 11/18/2020   Elevated blood pressure reading without diagnosis of hypertension 11/18/2020   Dehiscence of perineal wound 10/04/2018   Wound infection 10/04/2018   Port-A-Cath in place 07/07/2018   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 [place] [address] Phone: [phone] Fax: [phone]  [place] [address] Phone: [phone] Fax: [phone]  [place] [address], [place], [place]. [address] [place] [address] [place] [address] Phone: [phone] Fax: [phone]  [facility] [place], [place] [phone] [place] [address] Phone: [phone] Fax: [phone]  [place], [place] [facility] [address] [place] [address] Phone: [phone] Fax: [phone]     Social Determinants of Health (SDOH) Interventions    Readmission Risk Interventions No flowsheet data found.

## 2020-12-22 NOTE — Progress Notes (Signed)
Subjective: Patient appropriately tearful today in discussion about prognosis.  Still with significant pain in her perineum from drainage.  ROS: See above, otherwise other systems negative  Objective: Vital signs in last 24 hours: Temp:  [97.6 F (36.4 C)-98.5 F (36.9 C)] 98.2 F (36.8 C) (06/13 0748) Pulse Rate:  [110-127] 127 (06/13 0748) Resp:  [14-20] 20 (06/13 0748) BP: (101-125)/(73-83) 110/82 (06/13 0748) SpO2:  [96 %-100 %] 96 % (06/13 0748) Last BM Date: 12/19/20  Intake/Output from previous day: 06/12 0701 - 06/13 0700 In: 120 [P.O.:120] Out: -  Intake/Output this shift: No intake/output data recorded.  PE: Gen: NAD, appropriately tearful Abd: soft, NT, +BS, ostomy in place with viable stoma  Lab Results:  No results for input(s): WBC, HGB, HCT, PLT in the last 72 hours. BMET No results for input(s): NA, K, CL, CO2, GLUCOSE, BUN, CREATININE, CALCIUM in the last 72 hours. PT/INR No results for input(s): LABPROT, INR in the last 72 hours. CMP     Component Value Date/Time   NA 129 (L) 12/19/2020 0459   K 3.8 12/19/2020 0459   CL 94 (L) 12/19/2020 0459   CO2 28 12/19/2020 0459   GLUCOSE 119 (H) 12/19/2020 0459   BUN 6 12/19/2020 0459   CREATININE 0.39 (L) 12/19/2020 0459   CREATININE 0.38 (L) 11/26/2020 0919   CALCIUM 7.7 (L) 12/19/2020 0459   PROT 5.5 (L) 12/17/2020 2052   ALBUMIN 2.4 (L) 12/17/2020 2052   AST 34 12/17/2020 2052   AST 33 11/26/2020 0919   ALT 11 12/17/2020 2052   ALT 10 11/26/2020 0919   ALKPHOS 157 (H) 12/17/2020 2052   BILITOT 1.0 12/17/2020 2052   BILITOT 1.0 11/26/2020 0919   GFRNONAA >60 12/19/2020 0459   GFRNONAA >60 11/26/2020 0919   GFRAA >60 04/10/2020 0930   Lipase     Component Value Date/Time   LIPASE 34 11/18/2020 1346       Studies/Results: No results found.  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 Dema Severin) and radical vaginectomy (Dr. Denman George) 09/01/2018 -WBC down to 18K today, AF -Dr. Dema Severin came by and had a long discussion with patient regarding overall prognosis. -Dr. Benay Spice remains out.  Will try to reach out to one of his partners, possibly Dr. Burr Medico, since she does GI malignancies as well, as patient would really like to discuss if there is any other treatment options. -discussed that there may not be, for which palliative care is following and greatly appreciate their assistance with pain control and EOL discussions. -agree with water barrier type paste to help with skin maceration from vaginal drainage  FEN - regular diet VTE - SCDs ID - Rocephin for UTI  Anxiety-continue home xanax GERD-PPI Anemia of chronic disease-hemoglobin stable UTI- rocephin, purwick is helping with urinary frequency   LOS: 5 days    Henreitta Cea , [facility] Surgery 12/22/2020, 8:54 AM Please see Amion for pager number during day hours 7:00am-4:30pm or 7:00am -11:30am on weekends

## 2020-12-23 DIAGNOSIS — G893 Neoplasm related pain (acute) (chronic): Secondary | ICD-10-CM

## 2020-12-23 LAB — BASIC METABOLIC PANEL
Anion gap: 4 — ABNORMAL LOW (ref 5–15)
BUN: <5 mg/dL — ABNORMAL LOW (ref 6–20)
CO2: 26 mmol/L (ref 22–32)
Calcium: 7.9 mg/dL — ABNORMAL LOW (ref 8.9–10.3)
Chloride: 102 mmol/L (ref 98–111)
Creatinine, Ser: 0.31 mg/dL — ABNORMAL LOW (ref 0.44–1.00)
GFR, Estimated: >60 mL/min (ref >60)
Glucose, Bld: 111 mg/dL — ABNORMAL HIGH (ref 70–99)
Potassium: 5.2 mmol/L — ABNORMAL HIGH (ref 3.5–5.1)
Sodium: 132 mmol/L — ABNORMAL LOW (ref 135–145)

## 2020-12-23 MED ORDER — HYDROMORPHONE 1 MG/ML IV SOLN: INTRAVENOUS | 0 refills | Status: AC

## 2020-12-23 NOTE — Progress Notes (Addendum)
HEMATOLOGY-ONCOLOGY PROGRESS NOTE  SUBJECTIVE: The patient continues to have persistent pain in her perineal area.  She reports that the pain is both at the skin and down deeper.  Reports Dilaudid PCA and increase in OxyContin dose are not effective.  Continues to have stool and urine that moves out of her rectum.  Social work has reported that there is a bed available for her at residential hospice today.  Oncology History  Rectal cancer ([place])  04/27/2018 Initial Diagnosis   Rectal cancer ([place])    05/11/2018 -  Chemotherapy    Patient is on Treatment Plan: COLORECTAL FOLFOX + ZIRABEV Q 21 DAYS       06/13/2019 - 10/15/2020 Chemotherapy    Patient is on Treatment Plan: COLORECTAL FOLFOX + ZIRABEV Q 21 DAYS         PHYSICAL EXAMINATION:  Vitals:   12/23/20 0401 12/23/20 0414  BP:  112/73  Pulse:  (!) 116  Resp: 13 14  Temp:  97.8 F (36.6 C)  SpO2: 98% 95%   Filed Weights   12/17/20 1926  Weight: 59.7 kg    Intake/Output from previous day: 06/13 0701 - 06/14 0700 In: 1436.2 [P.O.:120; I.V.:1016.2; IV Piggyback:300] Out: -   GENERAL: Alert, tearful ABDOMEN: Soft, mildly tender with palpation, ostomy in place Rectal: Skin surrounding perineum and rectum excoriated, urine/liquid stool oozing out of rectal area NEURO: alert & oriented x 3 with fluent speech, no focal motor/sensory deficits  Port-A-Cath without erythema  LABORATORY DATA:  I have reviewed the data as listed CMP Latest Ref Rng & Units 12/23/2020 12/22/2020 12/19/2020  Glucose 70 - 99 mg/dL 111(H) 119(H) 119(H)  BUN 6 - 20 mg/dL <5(L) <5(L) 6  Creatinine 0.44 - 1.00 mg/dL 0.31(L) 0.37(L) 0.39(L)  Sodium 135 - 145 mmol/L 132(L) 133(L) 129(L)  Potassium 3.5 - 5.1 mmol/L 5.2(H) 5.5(H) 3.8  Chloride 98 - 111 mmol/L 102 104 94(L)  CO2 22 - 32 mmol/L 26 25 28   Calcium 8.9 - 10.3 mg/dL 7.9(L) 7.8(L) 7.7(L)  Total Protein 6.5 - 8.1 g/dL - - -  Total Bilirubin 0.3 - 1.2 mg/dL - - -  Alkaline Phos 38 - 126 U/L  - - -  AST 15 - 41 U/L - - -  ALT 0 - 44 U/L - - -    Lab Results  Component Value Date   WBC 18.3 (H) 12/22/2020   HGB 9.3 (L) 12/22/2020   HCT 30.4 (L) 12/22/2020   MCV 72.0 (L) 12/22/2020   PLT 280 12/22/2020   NEUTROABS 16.0 (H) 12/17/2020    CT ABDOMEN PELVIS WO CONTRAST  Result Date: 12/18/2020 CLINICAL DATA:  Feculent vaginal discharge, leukocytosis EXAM: CT ABDOMEN AND PELVIS WITHOUT CONTRAST TECHNIQUE: Multidetector CT imaging of the abdomen and pelvis was performed following the standard protocol without IV contrast. COMPARISON:  12/03/2020 FINDINGS: Lower chest: Multiple pulmonary nodules are again identified within the visualized lung bases, stable since prior examination, several of which demonstrate cavitation and are compatible with bilateral pulmonary metastases. Moderate coronary artery calcification. Trace pericardial effusion is stable. Cardiac size within normal limits. Hepatobiliary: No focal liver abnormality is seen. No gallstones, gallbladder wall thickening, or biliary dilatation. Pancreas: Unremarkable Spleen: Unremarkable Adrenals/Urinary Tract: The adrenal glands are unremarkable. The kidneys are normal in size and position. No intrarenal or ureteral calculi are seen. There is no hydronephrosis. The bladder is markedly distended, progressive since prior examination, suggesting changes of possible bladder outlet obstruction. Stomach/Bowel: Extensive peritoneal thickening and nodularity is again identified,  best appreciated within the right upper quadrant and right mid abdomen, as well as progressive nodular omental thickening in this region in keeping with peritoneal carcinomatosis. There is moderate ascites again identified., slightly increased in volume since prior examination. Surgical changes of abdominal peroneal resection and left lower quadrant descending, and colostomy are identified. There has developed marked circumferential bowel wall thickening and pericolonic  inflammatory stranding in keeping with changes of a diffuse infectious or inflammatory colitis such as pseudomembranous colitis. No pneumatosis or mesenteric or portal venous gas identified. No evidence of obstruction. There is, additionally, diffuse mucosal fold thickening involving multiple loops of small bowel as well as the a duodenum. The findings can be seen in setting of hypoproteinemia with resultant bowel edema, or venous obstruction with resultant venous congestion. A infectious or inflammatory enteritis is possible, but considered less likely given the extent and uniformity of the abnormality. These findings could, altogether, also be seen in the setting of intraperitoneal chemotherapy administration if there has been a history of such intervention. Enteric contrast is seen extending into multiple loops within the pelvis. There is a leak identified involving a single loop of small bowel posterior to the a vaginal cuff, best seen on axial image # 75 with extension of enteric contrast into the presacral space, and ultimately communicating with the vagina, best seen on axial image # 83. The cyst is seen altogether on sagittal image # 49. No evidence of obstruction.  No free intraperitoneal gas. Vascular/Lymphatic: There is moderate aortoiliac atherosclerotic calcification. No aortic aneurysm. No pathologic adenopathy within the abdomen and pelvis. Reproductive: Uterus absent.  No adnexal masses. Other: Mild developing subcutaneous edema within the flanks bilaterally in keeping with mild anasarca. Musculoskeletal: No acute bone abnormality. No lytic or blastic bone lesions. IMPRESSION: Interval development of marked pancolonic bowel wall thickening and mild pericolonic inflammatory stranding most suggestive of an infectious or inflammatory colitis such as pseudomembranous colitis. Superimposed long segment mucosal fold thickening involving the duodenum and proximal and mid small bowel. The stented disease  favors a more diffuse process, as can be seen with hypoproteinemia and resultant bowel edema or, more likely, venous obstruction and resultant venous congestion of the bowel. Contrast enhanced examination or abdominal Doppler sonography with particular attention to the portal vein may be helpful to assess patency of the portal mesenteric system. Alternatively, these findings could be seen in the setting of intraperitoneal chemotherapy administration and correlation with the patient's clinical history would be helpful. 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 Progressive peritoneal thickening and peritoneal and omental nodularity in keeping with peritoneal carcinomatosis. Mild interval increase in ascites. Bibasilar pulmonary nodules in keeping with pulmonary metastatic disease. Electronically Signed   By: Fidela Salisbury MD   On: 12/18/2020 00:01   CT ABDOMEN PELVIS W CONTRAST  Result Date: 12/03/2020 CLINICAL DATA:  Abdominal distension. History of metastatic rectal cancer. EXAM: CT ABDOMEN AND PELVIS WITH CONTRAST TECHNIQUE: Multidetector CT imaging of the abdomen and pelvis was performed using the standard protocol following bolus administration of intravenous contrast. CONTRAST:  163m OMNIPAQUE IOHEXOL 300 MG/ML  SOLN COMPARISON:  Nov 18, 2020. FINDINGS: Lower chest: Pulmonary nodules are noted in the visualized lung bases concerning for metastatic disease. Hepatobiliary: No focal liver abnormality is seen. No gallstones, gallbladder wall thickening, or biliary dilatation. Pancreas: Unremarkable. No pancreatic ductal dilatation or surrounding inflammatory changes. Spleen: Normal in size without focal abnormality. Adrenals/Urinary Tract: Adrenal glands are unremarkable. Kidneys are normal, without  renal calculi, focal lesion, or hydronephrosis. Bladder is unremarkable. Stomach/Bowel: The stomach is unremarkable. Mildly dilated  small bowel loops are noted. Colostomy is again noted in the left lower quadrant. Increased dilatation of the colon is noted which extends to the colostomy, concerning for distal colonic obstruction. Potentially this may be related to inflammation of the colostomy itself. Vascular/Lymphatic: Aortic atherosclerosis. No enlarged abdominal or pelvic lymph nodes. Reproductive: Status post hysterectomy. No adnexal masses. Other: Mild ascites is seen in the pelvis as well as around the liver and spleen. Subtle peritoneal wall thickening is seen posterior to inferior tip of right hepatic lobe; peritoneal implant or carcinomatosis cannot be excluded. Musculoskeletal: No acute or significant osseous findings. IMPRESSION: Pulmonary nodules are again noted in the visualized lung bases concerning for metastatic disease. Mild small bowel dilatation is noted, as well as increased dilatation of the colon which extends up to the colostomy in the left lower quadrant, concerning for distal colonic obstruction. Potentially this may related to inflammation of the colostomy itself. Mild ascites is noted in the pelvis as well as around the liver and spleen. Subtle peritoneal wall thickening is seen posterior to inferior tip of right hepatic lobe; peritoneal implant or carcinomatosis cannot be excluded. Electronically Signed   By: Marijo Conception M.D.   On: 12/03/2020 20:05   DG Abd 2 Views  Result Date: 11/28/2020 CLINICAL DATA:  Colon carcinoma.  Abdominal pain EXAM: ABDOMEN - 2 VIEW COMPARISON:  CT abdomen and pelvis Nov 18, 2020 FINDINGS: Supine and upright images were obtained. There is mild stool volume in the colon. No appreciable bowel dilatation or air-fluid levels to suggest bowel obstruction. No free air. There is mild left base atelectasis. Lung bases otherwise clear. Bibasilar nodular opacities consistent with metastatic foci. IMPRESSION: No evident bowel obstruction or free air. Mild left base atelectasis. Nodular  opacities in the lung bases consistent with metastases, noted on recent CT as well. Electronically Signed   By: Lowella Grip III M.D.   On: 11/28/2020 11:13   VAS [place] MESENTERIC  Result Date: 12/18/2020 ABDOMINAL VISCERAL Patient Name:  Tina Morales  Date of Exam:   12/18/2020 Medical Rec #: [id number]   Accession #:    [id number] Date of Birth: 1965/11/02   Patient Gender: F Patient Age:   54Y Exam Location:  [facility] Procedure:      VAS [place] MESENTERIC Referring Phys: [id number] [place] -------------------------------------------------------------------------------- Indications: Rule out PV and SMV thrombosis. Limitations: Air/bowel gas, patient discomfort and ascites. Comparison Study: No previous exams Performing Technologist: Jody Hill RVT, RDMS  Examination Guidelines: A complete evaluation includes B-mode imaging, spectral Doppler, color Doppler, and power Doppler as needed of all accessible portions of each vessel. Bilateral testing is considered an integral part of a complete examination. Limited examinations for reoccurring indications may be performed as noted.  Duplex Findings: +--------------------+--------+--------+------+--------+ Mesenteric          PSV cm/sEDV cm/sPlaqueComments +--------------------+--------+--------+------+--------+ Aorta Prox             77      17                  +--------------------+--------+--------+------+--------+ Celiac Artery Origin  133      43                  +--------------------+--------+--------+------+--------+ SMA Proximal          132      35                  +--------------------+--------+--------+------+--------+  SMA Mid                88      20                  +--------------------+--------+--------+------+--------+ SMA Distal             88      23                  +--------------------+--------+--------+------+--------+    Summary: Mesenteric: Normal Celiac artery and Superior Mesenteric artery  findings. Portal vein and SMV not visualized in this exam. IVC and splenic vein are patent.  *See table(s) above for measurements and observations.  Diagnosing physician: Monica Martinez MD  Electronically signed by Monica Martinez MD on 12/18/2020 at 7:12:49 PM.    Final    [place] ASCITES (ABDOMEN LIMITED)  Result Date: 12/01/2020 CLINICAL DATA:  Colorectal cancer, abdominal distension, assess for therapeutic paracentesis EXAM: LIMITED ABDOMEN ULTRASOUND FOR ASCITES TECHNIQUE: Limited ultrasound survey for ascites was performed in all four abdominal quadrants. COMPARISON:  11/18/2020 FINDINGS: Survey ultrasound performed of the abdominal 4 quadrants. Small amount of right upper quadrant perihepatic ascites. No large volume ascites appreciated that warrants therapeutic paracentesis. Procedure not performed. IMPRESSION: Small amount of right upper quadrant perihepatic ascites. Electronically Signed   By: Jerilynn Mages.  Shick M.D.   On: 12/01/2020 12:05   US LIVER DOPPLER LIMITED (PV OR SINGLE ART/VEIN)  Result Date: 12/19/2020 CLINICAL DATA:  Portal vein thrombosis, colorectal carcinoma stage IV EXAM: DUPLEX ULTRASOUND OF LIVER LIMITED TECHNIQUE: Limited color and duplex Doppler ultrasound was performed to evaluate the portal vein. COMPARISON:  CT 12/03/2020 and previous FINDINGS: Liver: Normal parenchymal echogenicity. Normal hepatic contour without nodularity. No focal lesion, mass or intrahepatic biliary ductal dilatation. Main Portal Vein size: 1.4 cm Portal Vein Velocities (all hepatopetal with monophasic waveform): Main  :  21 cm/sec Right: 24 cm/sec Left: 17 cm/sec IVC: Intrahepatic segment patent. Portal Vein Occlusion/Thrombus: No Ascites: Small volume perihepatic Varices: None Incidental note made of dilated CBD up to 10 mm diameter, stable by my measurement compared to prior CT. IMPRESSION: 1. Patent portal vein and main intrahepatic branches with antegrade monophasic flow signal. No evidence of portal vein  thrombosis. 2. Perihepatic ascites as before 3. Stable dilatation of the CBD. Electronically Signed   By: Lucrezia Europe M.D.   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), arising in high-grade dysplasia, no loss of mismatch repair protein expression CTs 02/10/2018- anterior rectal wall thickening, pulmonary nodules measuring up to 9 mm concerning for metastases, few round perirectal lymph nodes Pelvic MRI 4/43/1540- hypermetabolic low rectal mass extending to the posterior vagina with 2 small enlarged perirectal lymph nodes, T4b,N1- 2.3 cm from the anal verge PET scan 02/23/2018-hypermetabolic rectal mass, 8 mm hypermetabolic lingular nodule, scattered small bilateral lung nodules, some calcified, a few with mildly increased activity Cycle 1 FOLFOXIRI 03/01/2018 Cycle 5 FOLFOXIRI 04/26/2018 CTs 05/01/2018-significant interval response to therapy with decreased size of the primary rectal mass lesion.  Decreasing perirectal lymphadenopathy.  Decreased and/or resolved pulmonary nodules.  No new sites of disease identified. Cycle 6 FOLFOXIRI 05/11/2018 Radiation/Xeloda 06/12/2018-completed 07/21/2018 Xeloda dose reduced 07/10/2018 due to mucositis and diarrhea CTs 08/07/2018- compared to  02/10/2018- resolved and decreased pulmonary nodules, few tiny residual noncalcified nodules, decreased 20 perirectal lymph nodes, soft tissue of the rectum indistinguishable from posterior wall of vagina-  Vaginal involvement by tumor? APR/vaginectomy 09/01/2018-ypT4,ypNo, negative resection margins, involvement of the vagina per review of slides at GI tumor conference, no treatment effect-tumor regression score 3, no  loss of mismatch repair protein expression, MSI-stable, KRAS G12V K-ras G12V mutation CT chest 09/29/2018- enlargement of lung nodules, not a candidate for SBRT based on discussion in GI tumor conference and with radiation oncology CT chest 11/27/18 - interval growth of numerous pulmonary metastases bilaterally compared to 09/29/18, no new pulmonary mets  Cycle 1 FOLFOXIRI 11/29/18  Cycle 2 FOLFOXIRI 12/14/2018 Cycle 3 FOLFOXIRI 12/28/2018 Cycle 4 FOLFIRINOX 01/11/2019 CTs 01/24/2019-significant improvement of bilateral pulmonary metastases. Cycle 1 FOLFIRI 02/01/2019 Cycle 2 FOLFIRI 02/22/2019 Cycle 3 FOLFIRI 03/15/2019 Cycle 4 FOLFIRI 04/05/2019 Cycle 5 FOLFIRI 04/26/2019 CTs 05/14/2019-slight enlargement of right lung nodules, no other evidence of disease progression Cycle 6 FOLFIRI 05/17/2019 Cycle 7 FOLFIRI plus Avastin 06/13/2019 Cycle 8 FOLFIRI plus Avastin 07/09/2019 Cycle 9 FOLFIRI plus Avastin 07/25/2019 Cycle 10 FOLFIRI plus Avastin 08/09/2019 Cycle 11 FOLFIRI plus Avastin 08/22/2019 CTs 09/07/2019-most pulmonary nodules are stable.  1 nodule right lower lobe slightly larger.  No new lung lesions. Cycle 12 FOLFIRI plus Avastin 09/13/2019 Cycle 13 FOLFIRI plus Avastin 10/04/2019 Cycle 14 FOLFIRI plus Avastin 10/25/2019 Cycle 15 FOLFIRI plus Avastin 11/14/2019 Cycle 16 FOLFIRI plus Avastin 12/06/2019 CTs neck, chest, abdomen, pelvis 12/21/2019-neck negative; mildly progressive pulmonary metastases; abdomen and pelvis negative for evidence of metastatic disease. Cycle 1 FOLFOX/Avastin 12/27/2019 Cycle 2 FOLFOX/Avastin 01/17/2020 Cycle 3 FOLFOX/Avastin 02/07/2020 Cycle 4 FOLFOX 02/28/2020, Avastin held due to need for dental evaluation possible extractions Cycle 5 FOLFOX 03/20/2020, Avastin held pending dental evaluation CTs 04/04/2020-increased size of pulmonary nodules, no evidence of metastatic disease in the abdomen or pelvis Cycle 6 FOLFOX 05/22/2020, Avastin held secondary to dental extractions Cycle 7  FOLFOX/Avastin 06/12/2020 Cycle 8 FOLFOX/Avastin 06/30/2020 Cycle 9 FOLFOX/Avastin 07/24/2020 Cycle 10 FOLFOX/Avastin 08/14/2020 CTs 09/01/2020-improvement in bilateral pulmonary metastases. Cycle 11 FOLFOX/Avastin 09/04/2020 Cycle 12 FOLFOX/Avastin 09/25/2020 Cycle 13 FOLFOX/Avastin 10/15/2020 Cycle 14 FOLFOX/Avastin 11/05/2020 CTs 11/18/2020- stable lung nodules, no focal hepatic lesion, small amount of new pericholecystic fluid, mild submucosal edema and small bowel loops within the pelvis, fluid stool throughout the colon with submucosal edema and enhancement in the distal colon, new nodular enhancement at the posterior margin of the right liver CT 12/03/2020- pulmonary nodules again noted in the lung bases, mild small bowel dilatation and increased dilatation of the colon which extends up to the colostomy in the left lower quadrant concerning for distal colonic obstruction, mild ascites subtle peritoneal wall thickening seen in the posterior to inferior tip of the right hepatic lobe, peritoneal implant or carcinomatosis cannot be excluded.  CT 12/17/2020 -possible infectious or inflammatory colitis, enterovaginal fistula, progressive peritoneal thickening and peritoneal and omental nodularity in keeping with peritoneal carcinomatosis, mild interval increase in ascites, bibasilar pulmonary nodules 2.   History of diarrhea and rectal pain secondary to #1 3.   History of tobacco use 4.   Anemia secondary to thalassemia, rectal bleeding, and potentially  iron deficiency 5.   Diarrhea secondary to Xeloda and radiation.  Imodium as needed. 6.   Mucositis secondary to Xeloda.  Improved 07/19/2018. 7.   History of migraines 8.   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    Hypertension 10.  Oxaliplatin neuropathy    11.  Admission 11/18/2020 with nausea/vomiting, fever,  and abdominal pain-CT consistent with enteritis/colitis 12.  Admission 11/26/2020 with nausea, abdominal pain, loose stool, and dehydration 13.  Hospital  admission 11/26/2020- nausea, abdominal pain, and diarrhea 14.  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 Herbst appears unchanged.  She continues to have persistent pain in the rectal and perineal area.  Pain is due to excoriated skin and progressive cancer.  She is currently on OxyContin 20 mg twice a day and Dilaudid PCA which are ineffective in controlling her pain.  I have discussed with palliative care who plans to see the patient today to assist with adjusting pain medication.  We have discussed with the patient that she has had continued disease progression and surgery and additional chemotherapy would not be effective.  The patient has confirmed interest in hospice.  Social worker has been in touch with [facility] [place] and there is a bed at residential hospice for the patient today.  The patient and her husband confirmed that they wish to go to residential hospice initially and possibly go home when her pain is better controlled.  She is stable from our standpoint to be discharged to residential hospice once seen by palliative care for pain medication management.  Dr. Benay Spice will serve as her attending while on hospice services.  Recommendations: 1.  Adjustment of pain medication per palliative care.  Discontinue OxyContin if she is started on a base infusion rate of Dilaudid or morphine 2.  Okay to discharge to residential hospice today from our standpoint. 3.  We will be happy to see Ms. Bergey back in our office at any time.  We assured her that we are also available by telephone and for virtual visits that should she have any questions or concerns.   LOS: 6 days   Mikey Bussing, DNP, AGPCNP-BC, AOCNP 12/23/20  Ms. Pfefferle reports persistent pelvic/perineal pain despite the current narcotic regimen.  I recommend beginning a Dilaudid or morphine infusion with bolus dosing.  This can be continued in the hospice facility.  She has decided to transition to residential  hospice.  She is hopeful she may be able to return home.  I will be available to help with her care while enrolled in hospice.  We will asked Dr. Domingo Cocking to recommend infusion/bolus dosing.

## 2020-12-23 NOTE — Progress Notes (Signed)
Report given to Santiago Glad at [place].   [facility] placed in packet.  Port accessed per orders for transfer.

## 2020-12-23 NOTE — Plan of Care (Signed)
  Problem: Coping: Goal: Level of anxiety will decrease Outcome: Progressing   Problem: Pain Managment: Goal: General experience of comfort will improve Outcome: Progressing   

## 2020-12-23 NOTE — TOC Transition Note (Signed)
Transition of Care [facility]) - CM/SW Discharge Note   Patient Details  Name: Tina Morales MRN: [id number] Date of Birth: Feb 22, 1966  [facility] CM/SW Contact:  Lennart Pall, LCSW Phone Number: 12/23/2020, 2:56 PM   Clinical Narrative:    Pt medically cleared for dc today to [facility] called at 1440.  RN to call report to [phone].  No further TOC needs.   Final next level of care: [place] Barriers to Discharge: Barriers Resolved   Patient Goals and CMS Choice Patient states their goals for this hospitalization and ongoing recovery are:: return home      Discharge Placement              Patient chooses bed at:  ([facility]) Patient to be transferred to facility by: [facility] Name of family member notified: spouse Patient and family notified of of transfer: 12/23/20  Discharge Plan and Services In-house Referral: Clinical Social Work   Post Acute Care Choice: Hospice          DME Arranged: N/A DME Agency: NA         [place] Agency: [facility]        Social Determinants of Health (SDOH) Interventions     Readmission Risk Interventions No flowsheet data found.

## 2020-12-23 NOTE — Progress Notes (Signed)
Daily Progress Note   Patient Name: Tina Morales       Date: 12/23/2020 DOB: [birth year]  Age: 55 y.o. MRN#: [id number] Attending Physician: Nolon Nations, MD Primary Care Physician: Ladell Pier, MD Admit Date: 12/17/2020  Reason for Consultation/Follow-up: pain control and goals of care.   Subjective: I saw and examined Ms. Milazzo today.  She reports continuing to have 9 out of 10 pain.   Discussed continued use of PCA.  PCA query reveals she has used 19.5 mg of Dilaudid in the last 24 hours.  Additionally, she has had 20 mg OxyContin twice in the last 24 hours.    We discussed plan to transition to residential hospice in [place].  She does report that she wants to be closer to home and they feel that residential hospice is the best option moving forward.  I called and discussed with nurse practitioner from residential hospice facility.  We talked about options for care and pain management.  After discussion, they would like to see if she can be managed on oral pain medications.  If not, there is an option to continue with the PCA.  I discussed this with Ms. Holsonback and she reports being agreeable as long as there is the option to go to back to PCA if necessary.  Length of Stay: 6  Current Medications: Scheduled Meds:   Chlorhexidine Gluconate Cloth  6 each Topical Daily   feeding supplement  1 Container Oral TID BM   feeding supplement  237 mL Oral BID BM   HYDROmorphone   Intravenous Q4H   oxyCODONE  20 mg Oral Q12H   pantoprazole  40 mg Oral QHS   polycarbophil  625 mg Oral Daily    Continuous Infusions:  cefTRIAXone (ROCEPHIN)  IV 1 g (12/23/20 1647)   dextrose 5 % and 0.9% NaCl 50 mL/hr at 12/23/20 0706    PRN Meds: acetaminophen **OR** acetaminophen, ALPRAZolam, diphenhydrAMINE  **OR** diphenhydrAMINE, diphenhydrAMINE **OR** diphenhydrAMINE, metoprolol tartrate, naloxone **AND** sodium chloride flush, ondansetron **OR** ondansetron (ZOFRAN) IV, ondansetron (ZOFRAN) IV, prochlorperazine **OR** prochlorperazine, simethicone, Zinc Oxide  Physical Exam         Reasonably awake alert No distress Regular work of breathing Abdomen mildly distended No edema Some frailty and generalized weakness  Vital Signs: BP 119/76 (BP Location: Left Arm)  Pulse (!) 121   Temp 98.8 F (37.1 C) (Oral)   Resp 15   Ht 5\' 8"  (1.727 m)   Wt 59.7 kg   SpO2 92%   BMI 20.01 kg/m  SpO2: SpO2: 92 % O2 Device: O2 Device: Room Air O2 Flow Rate: O2 Flow Rate (L/min): 0 L/min  Intake/output summary:  Intake/Output Summary (Last 24 hours) at 12/23/2020 1714 Last data filed at 12/23/2020 0600 Gross per 24 hour  Intake 644.5 ml  Output --  Net 644.5 ml    LBM: Last BM Date: 12/22/20 Baseline Weight: Weight: 59.7 kg Most recent weight: Weight: 59.7 kg      PPS 50% Palliative Assessment/Data:      Patient Active Problem List   Diagnosis Date Noted   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   Goals of care, counseling/discussion    General weakness    Generalized pain    Vaginal pain 12/17/2020   Cancer associated pain    Palliative care by specialist    Abdominal pain 12/02/2020   Diarrhea 11/26/2020   Colorectal cancer ([place]) 11/18/2020   Hypokalemia 11/18/2020   Elevated blood pressure reading without diagnosis of hypertension 11/18/2020   Dehiscence of perineal wound 10/04/2018   Wound infection 10/04/2018   Port-A-Cath in place 07/07/2018   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   Recommendations/Plan: Plan for transition to residential hospice.  Discussed her case and pain management recommendations in depth with hospice provider. Discussed pain management options with patient and her husband.  We discussed that pain management would be further managed by hospice facility whenever she arrives and we talked about potential options of oral medications if she can tolerate in this controls her pain but also that PCA with Dilaudid can be reinstated if necessary if adequate pain control cannot be achieved with oral medications.   Goals of Care and Additional Recommendations: Limitations on Scope of Treatment: DNR DNI. Comfort measures. Hospice.   Code Status: now DNR DNI as of 12-22-2020.       Code Status History     Date Active Date Inactive Code Status Order ID Comments User Context   11/26/2020 1621 12/06/2020 1630 Full Code [id number]  Kayleen Memos, DO Inpatient   11/18/2020 2126 11/20/2020 1815 Full Code [id number]  Chotiner, Yevonne Aline, MD Inpatient   10/04/2018 1754 10/06/2018 1649 Full Code [id number]  Kinsinger, Arta Bruce, MD Inpatient   10/04/2018 1633 10/04/2018 1731 Full Code [id number]  Lorette Ang Outpatient   09/01/2018 0615 09/05/2018 1437 Full Code [id number]  Dorothyann Gibbs, NP Inpatient       Prognosis:  ?  Weeks  Discharge Planning:  residential hospice.  Care plan was discussed with patient and husband, also corresponded briefly with medical oncology, appreciate their input.  Thank you for allowing the Palliative Medicine Team to assist in the care of this patient.   Time In: 1000 1120 Time Out: 1020 1145 Total Time 45 Prolonged Time Billed  no    Greater than 50%  of this time was spent  counseling and coordinating care related to the above assessment and  plan.   Micheline Rough, MD  Please contact Palliative Medicine Team phone at [phone] for questions and concerns.

## 2020-12-23 NOTE — Progress Notes (Signed)
Subjective: CC: Notes reviewed. Patient is transitioning to hospice. She has been accepted into Hospice program/services with [facility]. TOC note reports they plan for residential bed availability today. She reports that she has no current questions. Her husband is at bedside.   Objective: Vital signs in last 24 hours: Temp:  [97.7 F (36.5 C)-98.7 F (37.1 C)] 97.8 F (36.6 C) (06/14 0414) Pulse Rate:  [115-122] 116 (06/14 0414) Resp:  [11-20] 14 (06/14 0414) BP: (107-126)/(73-88) 112/73 (06/14 0414) SpO2:  [94 %-100 %] 95 % (06/14 0414) FiO2 (%):  [98 %] 98 % (06/13 1610) Last BM Date: 12/22/20  Intake/Output from previous day: 06/13 0701 - 06/14 0700 In: 1436.2 [P.O.:120; I.V.:1016.2; IV Piggyback:300] Out: -  Intake/Output this shift: No intake/output data recorded.  PE: Gen:  Alert, NAD, pleasant Card:  Tachycardic  Pulm:  CTAB, no W/R/R, effort normal Abd: Soft, ND, NT, ostomy appliance in place with viable stoma Ext:  Trace LE edema b/l Psych: A&Ox3 Skin: no rashes noted, warm and dry  Lab Results:  Recent Labs    12/22/20 0818  WBC 18.3*  HGB 9.3*  HCT 30.4*  PLT 280   BMET Recent Labs    12/22/20 0818 12/23/20 0500  NA 133* 132*  K 5.5* 5.2*  CL 104 102  CO2 25 26  GLUCOSE 119* 111*  BUN <5* <5*  CREATININE 0.37* 0.31*  CALCIUM 7.8* 7.9*   PT/INR No results for input(s): LABPROT, INR in the last 72 hours. CMP     Component Value Date/Time   NA 132 (L) 12/23/2020 0500   K 5.2 (H) 12/23/2020 0500   CL 102 12/23/2020 0500   CO2 26 12/23/2020 0500   GLUCOSE 111 (H) 12/23/2020 0500   BUN <5 (L) 12/23/2020 0500   CREATININE 0.31 (L) 12/23/2020 0500   CREATININE 0.38 (L) 11/26/2020 0919   CALCIUM 7.9 (L) 12/23/2020 0500   PROT 5.5 (L) 12/17/2020 2052   ALBUMIN 2.4 (L) 12/17/2020 2052   AST 34 12/17/2020 2052   AST 33 11/26/2020 0919   ALT 11 12/17/2020 2052   ALT 10 11/26/2020 0919   ALKPHOS 157 (H) 12/17/2020 2052    BILITOT 1.0 12/17/2020 2052   BILITOT 1.0 11/26/2020 0919   GFRNONAA >60 12/23/2020 0500   GFRNONAA >60 11/26/2020 0919   GFRAA >60 04/10/2020 0930   Lipase     Component Value Date/Time   LIPASE 34 11/18/2020 1346       Studies/Results: No results found.  Anti-infectives: Anti-infectives (From admission, onward)    Start     Dose/Rate Route Frequency Ordered Stop   12/18/20 1730  cefTRIAXone (ROCEPHIN) 1 g in sodium chloride 0.9 % 100 mL IVPB       Note to Pharmacy: Ok to adjust dose as needed   1 g 200 mL/hr over 30 Minutes Intravenous Every 24 hours 12/18/20 1643          Assessment/Plan Enterovaginal fistula Metastatic rectal cancer on chemotherapy S/p APR with end colostomy (Dr. Dema Severin) and radical vaginectomy (Dr. Denman George) 09/01/2018 - Chart reviewed. After discussions with Dr. Dema Severin, Dr. Benay Spice and Palliative patient has made the decision to transition to hospice. She has been accepted into Hospice program/services with [facility]. TOC note reports they plan for residential bed availability today. She reports that she has no current questions. Her husband is at bedside. Will await further recs from palliative/hospice/toc.     Anxiety GERD Anemia of chronic  disease DNR   LOS: 6 days    Jillyn Ledger , [facility] 12/23/2020, 8:03 AM Please see Amion for pager number during day hours 7:00am-4:30pm

## 2020-12-23 NOTE — Discharge Summary (Signed)
Patient ID: Tina Morales [id number] 1965-11-08 55 y.o.  Admit date: 12/17/2020 Discharge date: 12/23/2020  Admitting Diagnosis: Enterovaginal fistula Metastatic rectal cancer on chemo S/p APR with end colostomy (Dr. Dema Severin) and radical vaginectomy (Dr. 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   Goals of care, counseling/discussion    General weakness    Generalized pain    Vaginal pain 12/17/2020   Cancer associated pain    Palliative care by specialist    Abdominal pain 12/02/2020   Diarrhea 11/26/2020   Colorectal cancer ([place]) 11/18/2020   Hypokalemia 11/18/2020   Elevated blood pressure reading without diagnosis of hypertension 11/18/2020   Dehiscence of perineal wound 10/04/2018   Wound infection 10/04/2018   Port-A-Cath in place 07/07/2018   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 She has been primairly following as an outpatient with medical oncology and has been maintained on systemic therapy including Avastin. Last dose was end of April . She was admitted twice in May with obstructive symptoms, went home 5/27. Reports she has been doing fine with decent ostomy function, has not needed to catheterize her stoma in about 4 days, but yesterday began having severe vaginal pain followed by passage of  stool from the vagina.     Hospital Course: Patient is found to have new enterovaginal fistula and progressively widespread metastatic disease.  Oncology was consulted and did not feel patient was a candidate for chemotherapy treatment.  She was not felt to be a surgical candidate given her peritoneal carcinomatosis, malignant ascites and radiated pelvis.  Palliative care was consulted and ultimately patient transition to hospice. On 6/14 the patient was discharge to [facility].  She was receiving pain control with a custom dose dilaudid PCA during her stay and will be continued and adjusted by hospice once at the facility   Allergies as of 12/23/2020       Reactions   Sulfa Antibiotics    Patient unaware of side effects, she was told when she was a child that she was allergic        Medication List     STOP taking these medications    dexamethasone 4 MG tablet Commonly known as: DECADRON   HYDROcodone-acetaminophen 5-325 MG tablet Commonly known as: Norco   polyethylene glycol 17 g packet Commonly known as: MiraLax       TAKE these medications    acetaminophen 500 MG tablet Commonly known as: TYLENOL Take 1,000 mg by mouth every 8 (eight) hours as needed (PAIN).   ALPRAZolam 0.5 MG tablet Commonly known as: XANAX Take 1 tablet (0.5 mg total) by mouth 2 (two) times daily as needed for anxiety. What changed:  how much to take reasons to take this   dicyclomine 10 MG capsule Commonly known as: BENTYL Take 1 capsule (10 mg total) by mouth 3 (three) times daily before meals.  diphenoxylate-atropine 2.5-0.025 MG tablet Commonly known as: LOMOTIL Take 2 tablets by mouth 4 (four) times daily as needed for diarrhea or loose stools.   docusate sodium 100 MG capsule Commonly known as: COLACE Take 1 capsule (100 mg total) by mouth 2 (two) times daily.   HYDROmorphone 1 mg/mL injection Commonly known as: DILAUDID Per hospice care provider    lidocaine-prilocaine cream Commonly known as: EMLA Apply 1 application topically as needed (port).   loratadine 10 MG tablet Commonly known as: CLARITIN Take 10 mg by mouth daily as needed for allergies.   Magnesium Oxide 250 MG Tabs Take 500 mg by mouth 2 (two) times daily.   omeprazole 20 MG capsule Commonly known as: PRILOSEC Take 20 mg by mouth See admin instructions. 20mg  oral daily And 20mg  daily as needed for indigestion   potassium chloride SA 20 MEQ tablet Commonly known as: KLOR-CON Take 1 tablet (20 mEq total) by mouth 2 (two) times daily.   prochlorperazine 10 MG tablet Commonly known as: COMPAZINE Take 1 tablet (10 mg total) by mouth every 6 (six) hours as needed for nausea or vomiting.         Follow-up Information     [name], MD Follow up.   Specialties: General Surgery, Colon and Rectal Surgery Why: As needed Contact information: [place] [id number] [phone]         Ladell Pier, MD Follow up.   Specialty: Oncology Why: As needed Contact information: [place] [address] [phone]                 Signed: Henreitta Cea, [facility] 12/23/2020, 12:35 PM Please see Amion for pager number during day hours 7:00am-4:30pm

## 2020-12-24 ENCOUNTER — Inpatient Hospital Stay: Payer: BC Managed Care – PPO

## 2020-12-24 ENCOUNTER — Inpatient Hospital Stay: Payer: BC Managed Care – PPO | Admitting: Nurse Practitioner

## 2020-12-24 NOTE — Progress Notes (Addendum)
Pt discharged before Dilaudid syringe PCA was wasted; charge nurse and RN witnessed waste. 38ml was wasted.

## 2020-12-24 NOTE — Progress Notes (Deleted)
Pt discharged before Dilaudid syringe PCA was wasted; charge nurse and RN witnessed waste.

## 2020-12-25 ENCOUNTER — Telehealth: Payer: Self-pay | Admitting: *Deleted

## 2020-12-25 NOTE — Telephone Encounter (Signed)
Husband dropped off form from social security disability. Reviewed forms and they include a ROI, which patient needs to complete as well as the form provided is for the patient to complete, not provider. Left VM with this information for patient as well as explained after they send this in, SS will contact [place] for records and this will be forwarded to a third party company that manages social security disability claims. Will leave form at nurse's desk for him to pick up.

## 2021-02-09 DEATH — deceased

## 2021-05-28 IMAGING — CT CT ABD-PELV W/ CM
2 of 5 series · 12 of 36 positions shown, 15 images · IV contrast (APPLIED)
Comparison: January 24, 2019, abdomen and pelvis comparison from [REDACTED]

CLINICAL DATA: Restage rectal cancer. Diagnosed in Friday February, 2018
found to have pulmonary metastatic disease and the local adenopathy.
Status post APR with vaginectomy and Sunday August, 2018 is status post
5 cycles of FOLFIRI.

EXAM:
CT CHEST, ABDOMEN, AND PELVIS WITH CONTRAST
TECHNIQUE: Multidetector CT imaging of the chest, abdomen and pelvis was
performed following the standard protocol during bolus
administration of intravenous contrast.
CONTRAST:  100mL OMNIPAQUE IOHEXOL 300 MG/ML  SOLN

[Series 2: cap with · axial · 0.79mm/px · z∈[-613,-68]mm · 9 of 135 slices shown, 12 images]
[im 13/135  mediastinal]
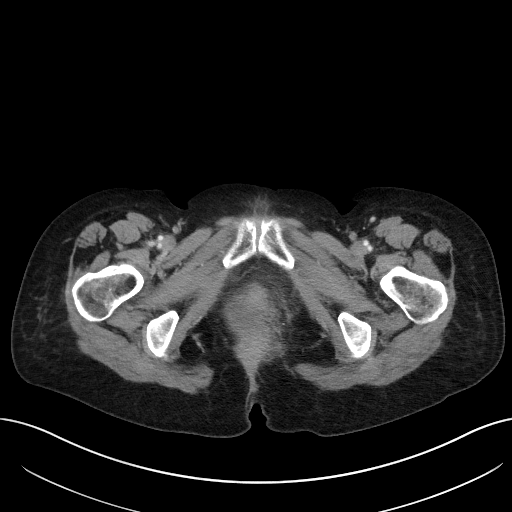
[im 13/135  lung]
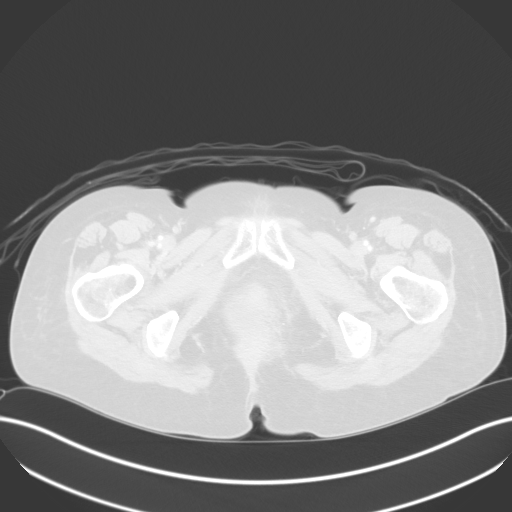
[im 25/135  lung]
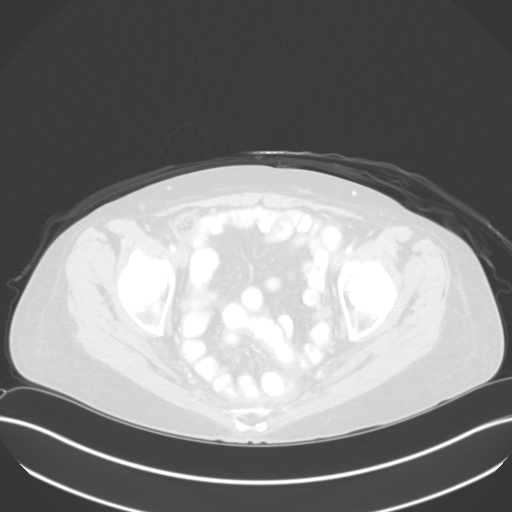
[im 37/135  lung]
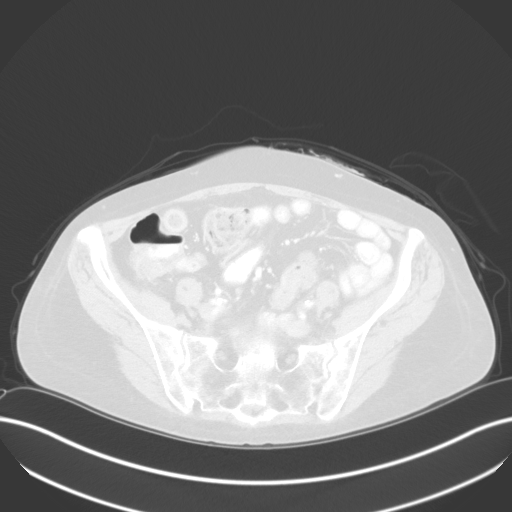
[im 49/135  lung]
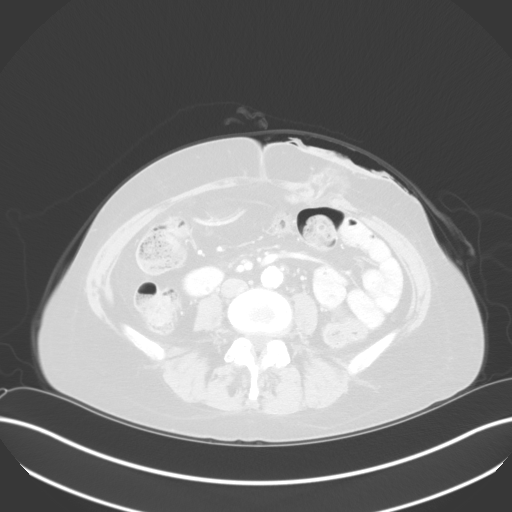
[im 74/135  mediastinal]
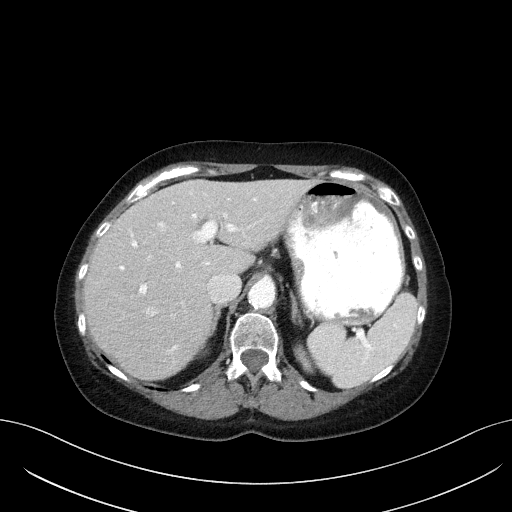
[im 74/135  lung]
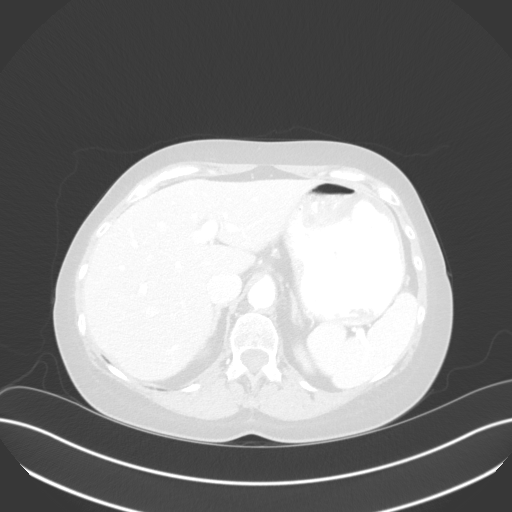
[im 86/135  lung]
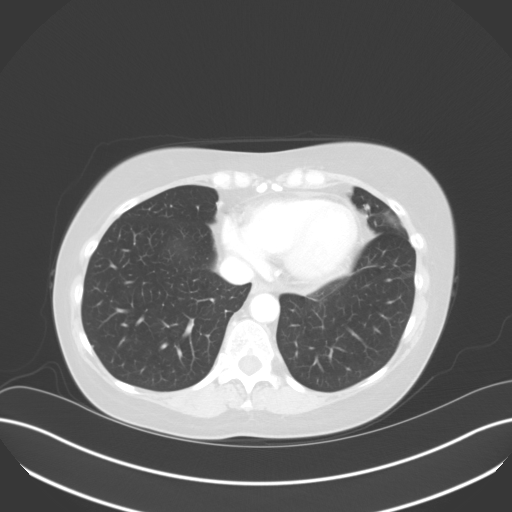
[im 98/135  lung]
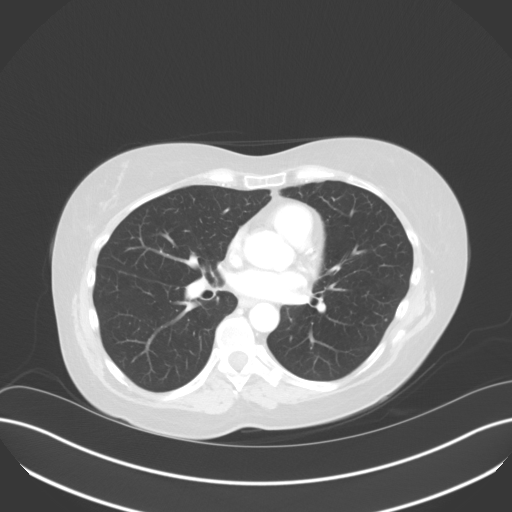
[im 110/135  lung]
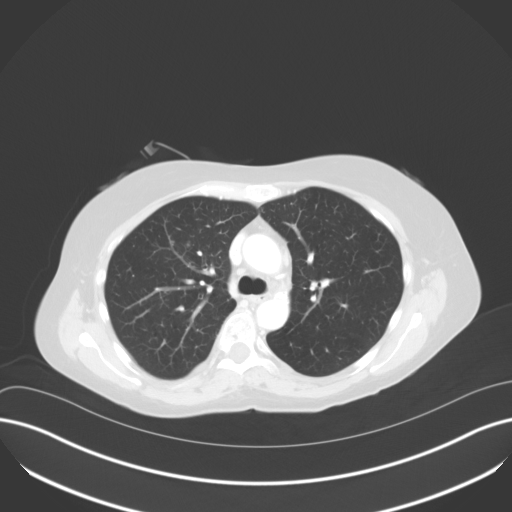
[im 122/135  mediastinal]
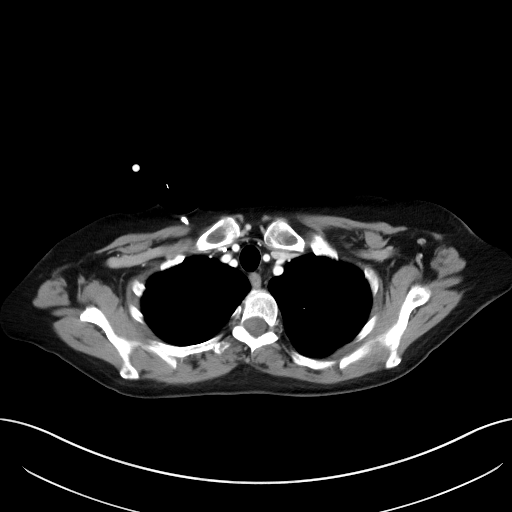
[im 122/135  lung]
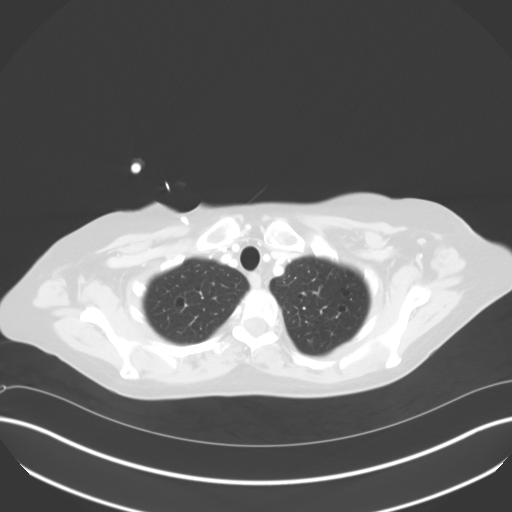

[Series 4: coronals · coronal · 0.66mm/px · 3 of 125 slices shown]
[im 25/125  lung]
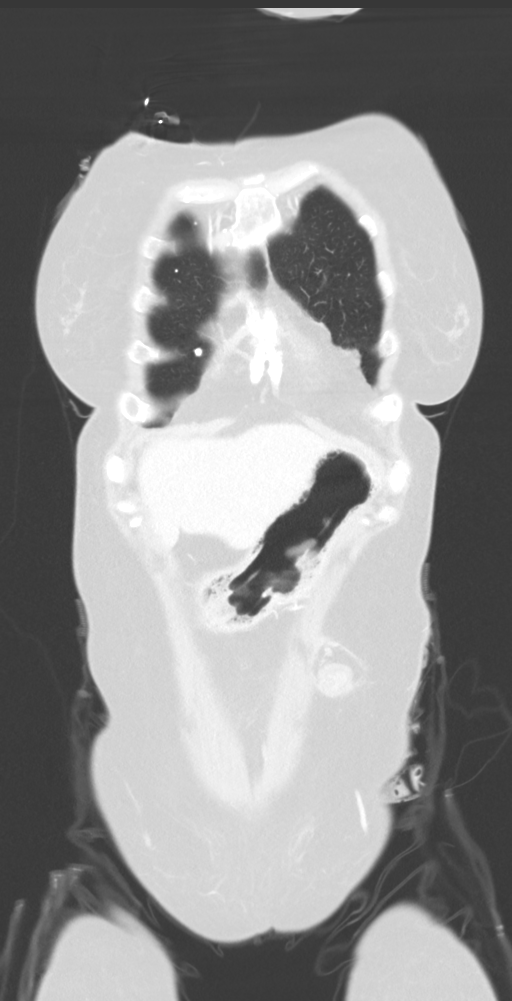
[im 50/125  lung]
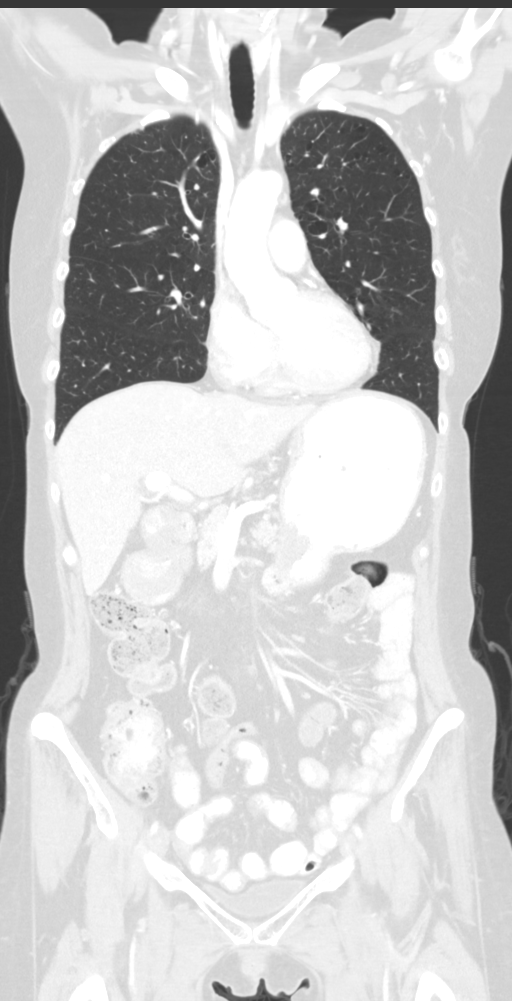
[im 75/125  lung]
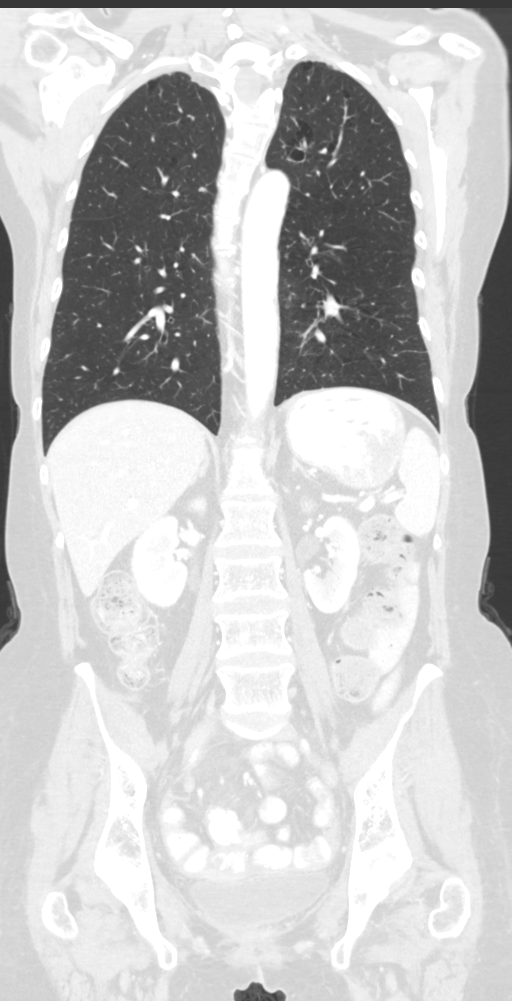

[12 of 36 positions shown; findings below may reference images not displayed]

FINDINGS: CT CHEST FINDINGS

Cardiovascular: Right IJ Port-A-Cath terminates at the caval to
atrial junction. Heart size is normal. Aortic caliber is normal with
scattered atherosclerotic changes both calcified and noncalcified.
No signs of pericardial effusion. Central pulmonary vasculature is
unremarkable.

Mediastinum/Nodes: No enlarged mediastinal, hilar, or axillary lymph
nodes. Thyroid gland, trachea, and esophagus demonstrate no
significant findings.

Lungs/Pleura: Centrilobular emphysema. Calcified granulomata of the
chest are similar to the prior study.

(Image 128, series 6) 4 mm right lower lobe nodule previously 3 mm

(Image 116, series 6) lobulated right lower lobe nodule 11 x 7 mm
previously 6 mm in greatest axial dimension.

(Image 60, series 6) 7 mm right upper lobe pulmonary nodule
previously 4 mm. Two additional nodules in the anterior right upper
lobe are stable.

Musculoskeletal: No signs of chest wall mass.

CT ABDOMEN PELVIS FINDINGS

Hepatobiliary: Liver is normal. Gallbladder and biliary tree are
unremarkable.

Pancreas: Normal

Spleen: Normal

Adrenals/Urinary Tract: Normal adrenal glands. Symmetric enhancement
of bilateral kidneys without signs of hydronephrosis.

Stomach/Bowel: No signs of acute gastrointestinal process. Positive
enteric contrast passes into the proximal colon. The appendix is
normal. Left lower quadrant colostomy following APR.

Soft tissue thickening along the site [DATE], nonspecific previously
containing gas in the immediate postoperative state.

Vascular/Lymphatic: Moderate calcific and noncalcific
atherosclerosis. No signs of aneurysm.

No signs of retroperitoneal or upper abdominal lymphadenopathy.

No signs of pelvic lymphadenopathy.

Reproductive: Uterus remains in place following APR and reported
vaginectomy.

Other: No signs of ascites. No peritoneal or omental nodularity.

Musculoskeletal: No signs of acute bone finding or evidence of
destructive bone process. Spinal curvature as before.
IMPRESSION: 1. Slight enlargement of right lower lobe nodules and the largest
right upper lobe nodule good evening particularly the lobulated
nodule in the right lower lobe as described.
2. Soft tissue thickening along the site [DATE] is nonspecific
previously containing gas in the immediate postoperative state. This
will serve as a baseline for future follow-up.
3. Emphysema and aortic atherosclerosis.

Aortic Atherosclerosis (8JQU2-X83.3) and Emphysema (8JQU2-PDS.M).

## 2021-05-28 IMAGING — CT CT CHEST W/ CM
2 of 5 series · 12 of 36 positions shown, 15 images · IV contrast (APPLIED)
Comparison: January 24, 2019, abdomen and pelvis comparison from [REDACTED]

CLINICAL DATA: Restage rectal cancer. Diagnosed in Friday February, 2018
found to have pulmonary metastatic disease and the local adenopathy.
Status post APR with vaginectomy and Sunday August, 2018 is status post
5 cycles of FOLFIRI.

EXAM:
CT CHEST, ABDOMEN, AND PELVIS WITH CONTRAST
TECHNIQUE: Multidetector CT imaging of the chest, abdomen and pelvis was
performed following the standard protocol during bolus
administration of intravenous contrast.
CONTRAST:  100mL OMNIPAQUE IOHEXOL 300 MG/ML  SOLN

[Series 2: cap with · axial · 0.79mm/px · z∈[-613,-68]mm · 9 of 135 slices shown, 12 images]
[im 13/135  mediastinal]
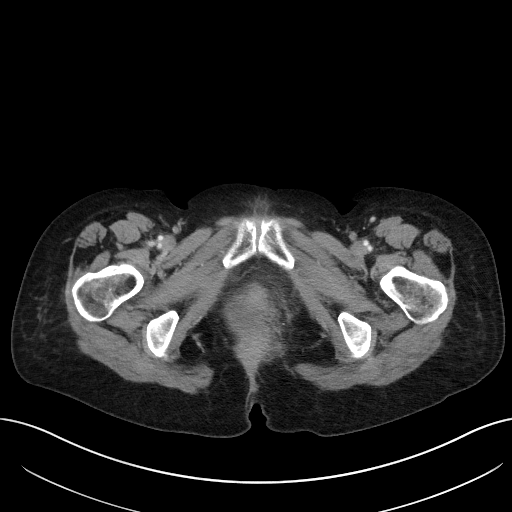
[im 13/135  lung]
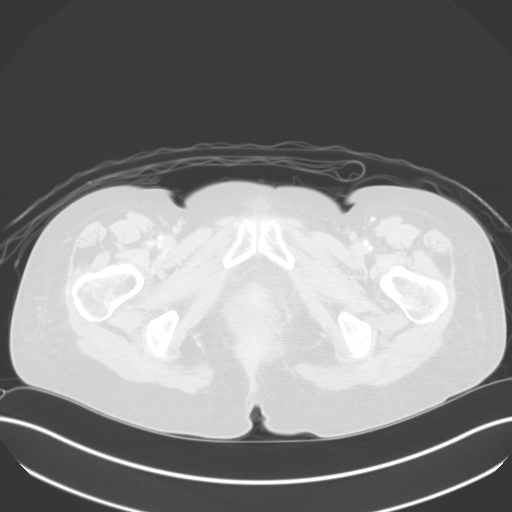
[im 25/135  lung]
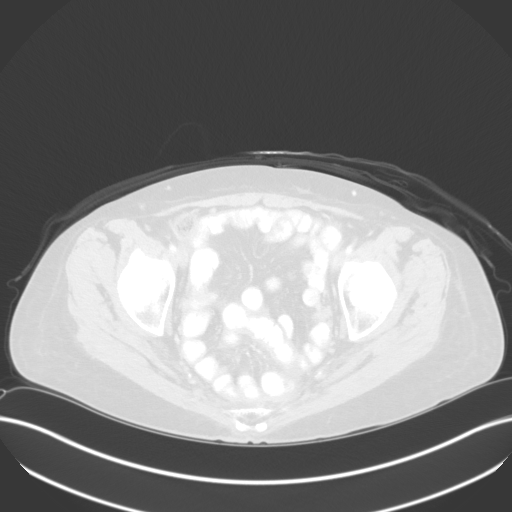
[im 37/135  lung]
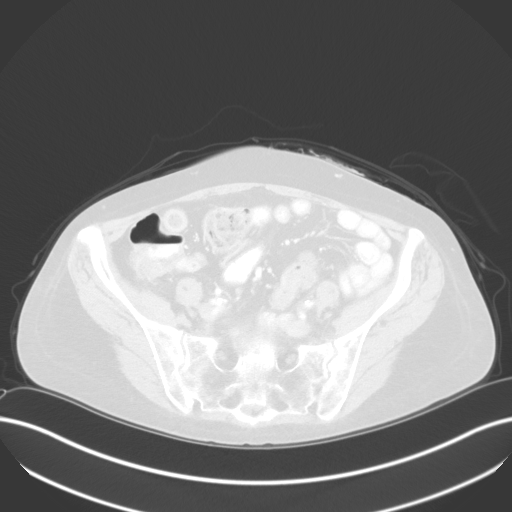
[im 49/135  lung]
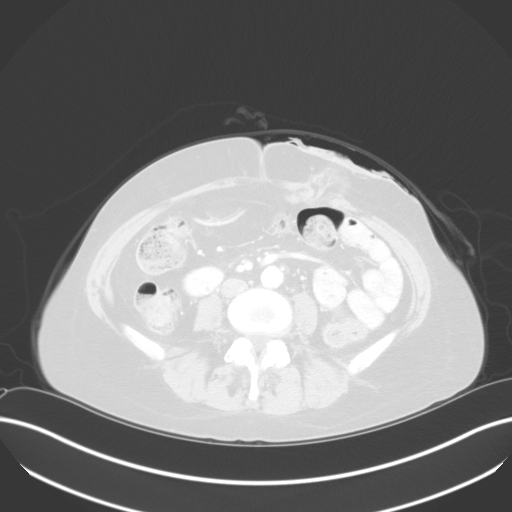
[im 74/135  mediastinal]
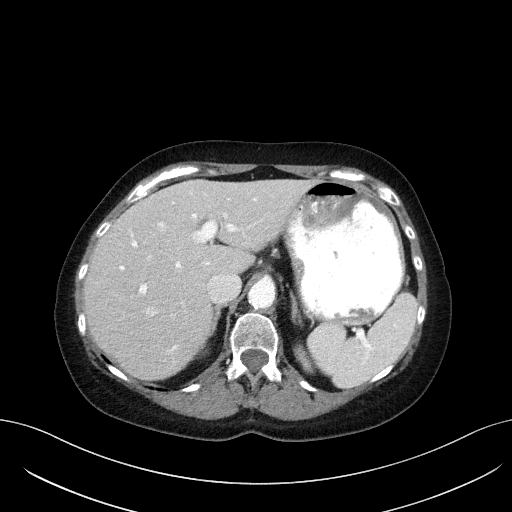
[im 74/135  lung]
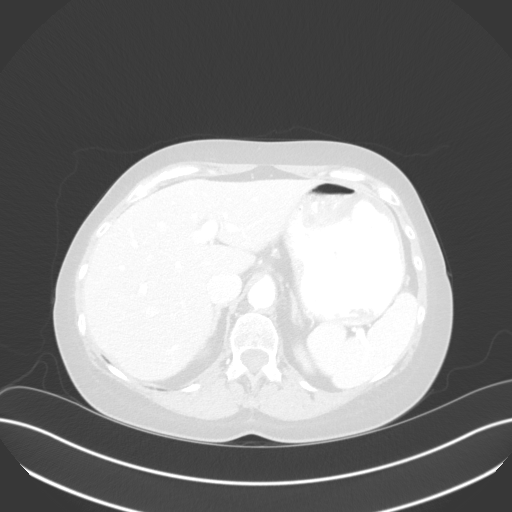
[im 86/135  lung]
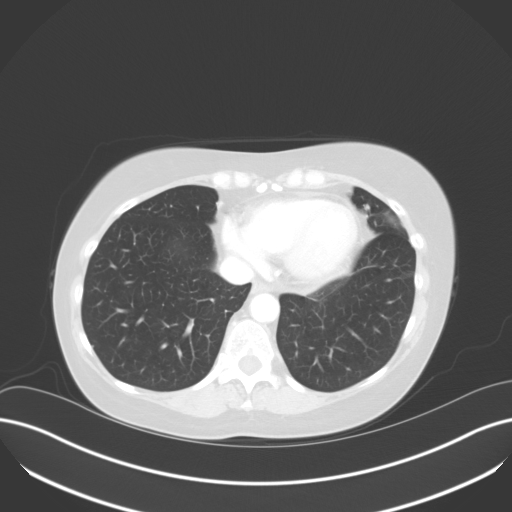
[im 98/135  lung]
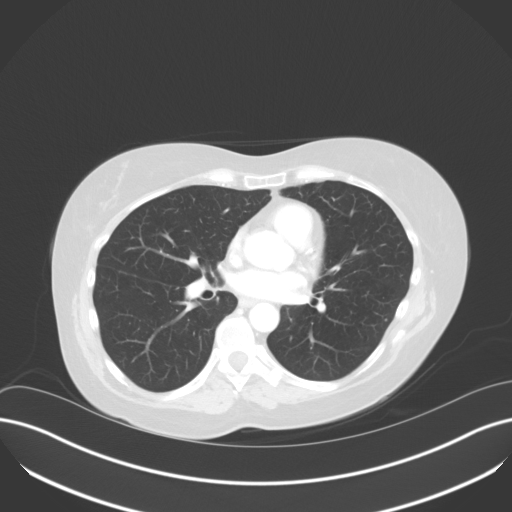
[im 110/135  lung]
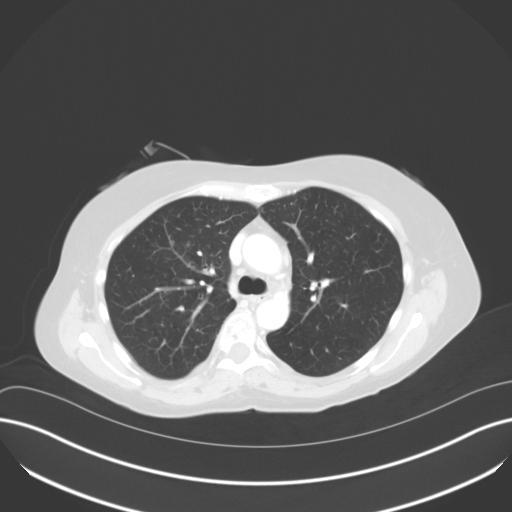
[im 122/135  mediastinal]
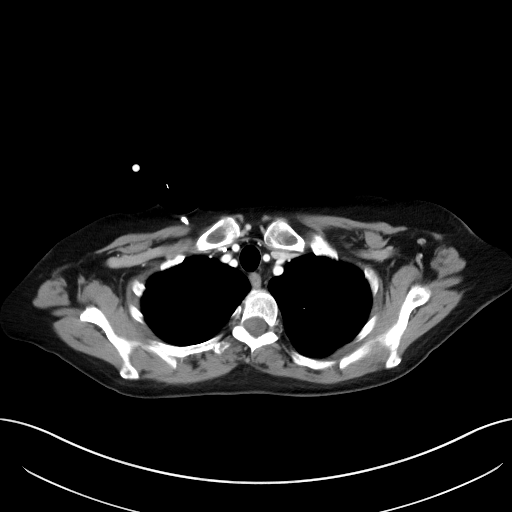
[im 122/135  lung]
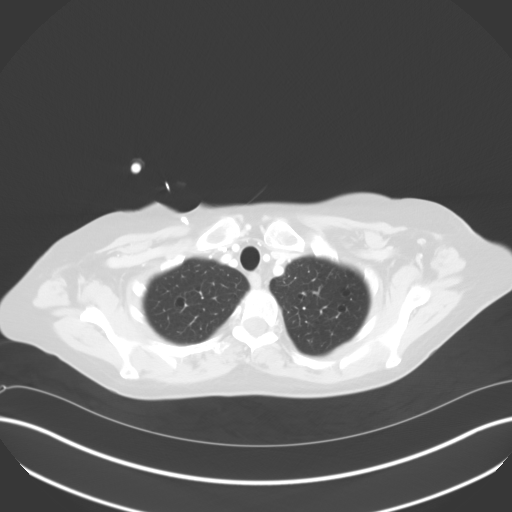

[Series 4: coronals · coronal · 0.66mm/px · 3 of 125 slices shown]
[im 25/125  lung]
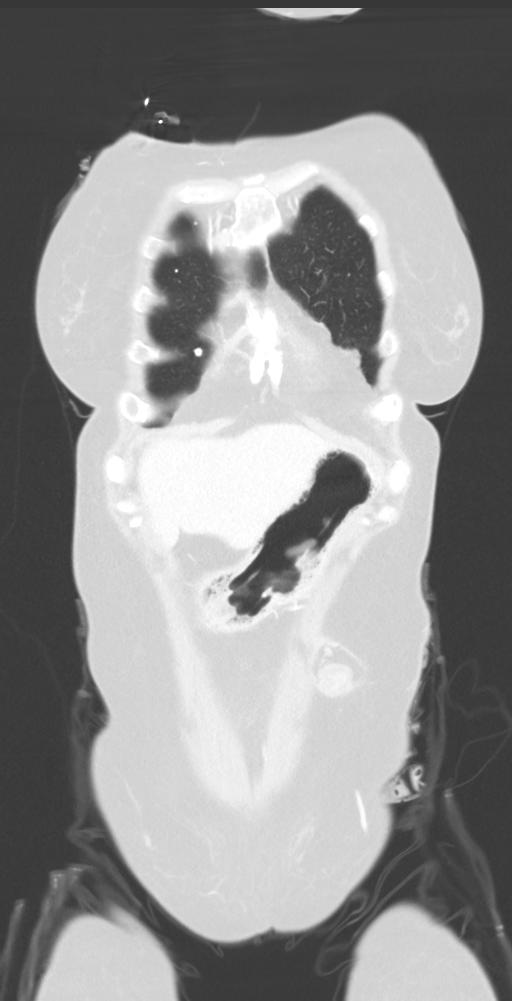
[im 50/125  lung]
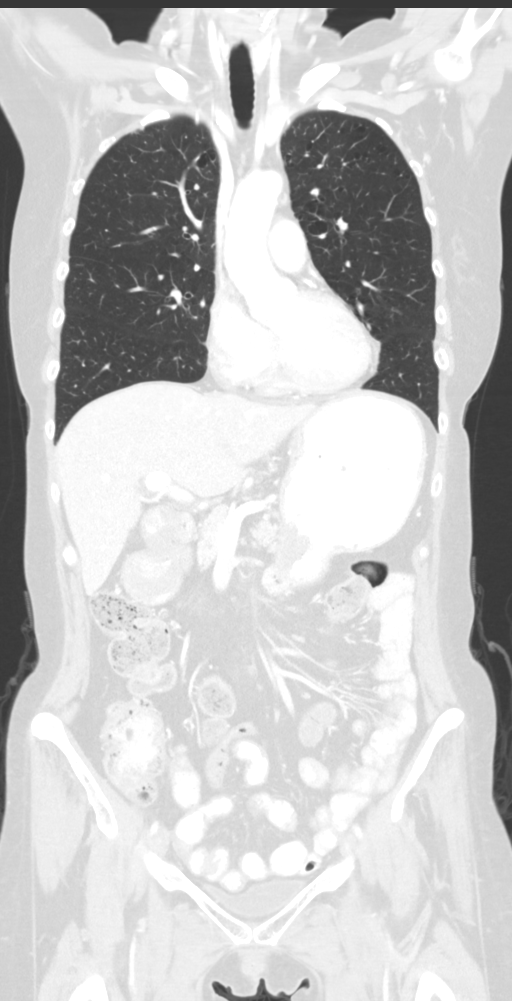
[im 75/125  lung]
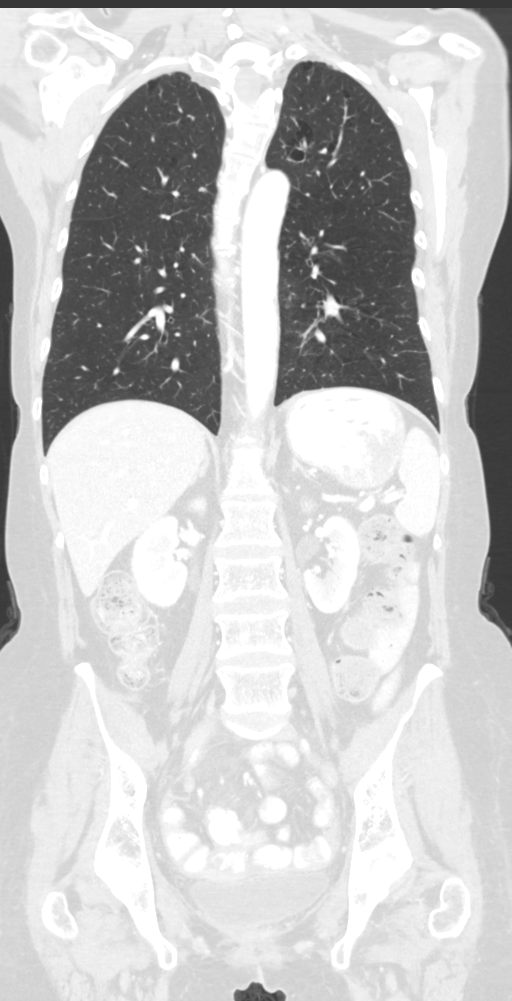

[12 of 36 positions shown; findings below may reference images not displayed]

FINDINGS: CT CHEST FINDINGS

Cardiovascular: Right IJ Port-A-Cath terminates at the caval to
atrial junction. Heart size is normal. Aortic caliber is normal with
scattered atherosclerotic changes both calcified and noncalcified.
No signs of pericardial effusion. Central pulmonary vasculature is
unremarkable.

Mediastinum/Nodes: No enlarged mediastinal, hilar, or axillary lymph
nodes. Thyroid gland, trachea, and esophagus demonstrate no
significant findings.

Lungs/Pleura: Centrilobular emphysema. Calcified granulomata of the
chest are similar to the prior study.

(Image 128, series 6) 4 mm right lower lobe nodule previously 3 mm

(Image 116, series 6) lobulated right lower lobe nodule 11 x 7 mm
previously 6 mm in greatest axial dimension.

(Image 60, series 6) 7 mm right upper lobe pulmonary nodule
previously 4 mm. Two additional nodules in the anterior right upper
lobe are stable.

Musculoskeletal: No signs of chest wall mass.

CT ABDOMEN PELVIS FINDINGS

Hepatobiliary: Liver is normal. Gallbladder and biliary tree are
unremarkable.

Pancreas: Normal

Spleen: Normal

Adrenals/Urinary Tract: Normal adrenal glands. Symmetric enhancement
of bilateral kidneys without signs of hydronephrosis.

Stomach/Bowel: No signs of acute gastrointestinal process. Positive
enteric contrast passes into the proximal colon. The appendix is
normal. Left lower quadrant colostomy following APR.

Soft tissue thickening along the site [DATE], nonspecific previously
containing gas in the immediate postoperative state.

Vascular/Lymphatic: Moderate calcific and noncalcific
atherosclerosis. No signs of aneurysm.

No signs of retroperitoneal or upper abdominal lymphadenopathy.

No signs of pelvic lymphadenopathy.

Reproductive: Uterus remains in place following APR and reported
vaginectomy.

Other: No signs of ascites. No peritoneal or omental nodularity.

Musculoskeletal: No signs of acute bone finding or evidence of
destructive bone process. Spinal curvature as before.
IMPRESSION: 1. Slight enlargement of right lower lobe nodules and the largest
right upper lobe nodule good evening particularly the lobulated
nodule in the right lower lobe as described.
2. Soft tissue thickening along the site [DATE] is nonspecific
previously containing gas in the immediate postoperative state. This
will serve as a baseline for future follow-up.
3. Emphysema and aortic atherosclerosis.

Aortic Atherosclerosis (8JQU2-X83.3) and Emphysema (8JQU2-PDS.M).

## 2022-01-04 IMAGING — CT CT NECK W/ CM
3 of 9 series · 10 of 33 positions shown, 12 images · IV contrast (omnipaque)
Comparison: CT Chest, Abdomen, and Pelvis today are reported
separately. No prior neck imaging.

CLINICAL DATA: 53-year-old female with metastatic colon cancer.
Chemotherapy on going. Posterior neck pain and headache.

EXAM:
CT NECK WITH CONTRAST
TECHNIQUE: Multidetector CT imaging of the neck was performed using the
standard protocol following the bolus administration of intravenous
contrast.
CONTRAST:  100mL OMNIPAQUE IOHEXOL 300 MG/ML SOLN in conjunction
with contrast enhanced imaging of the chest, abdomen, and pelvis
reported separately.

[Series 2: cap with · axial · 0.75mm/px · z∈[-822,-152]mm · 3 of 135 slices shown, 4 images]
[im 1/135  soft-tissue]
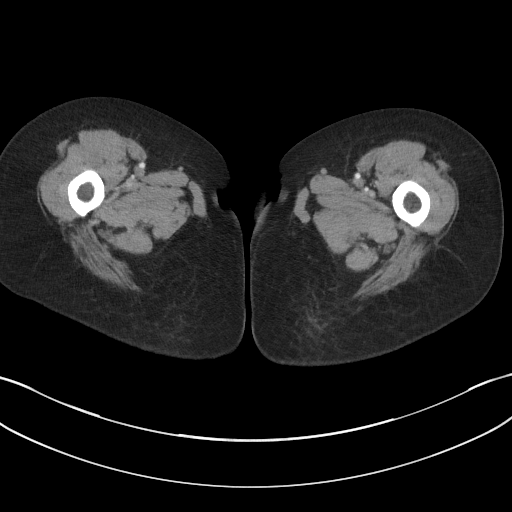
[im 1/135  bone]
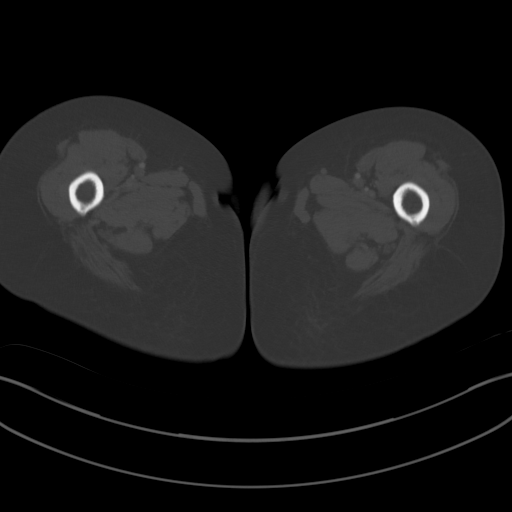
[im 68/135  bone]
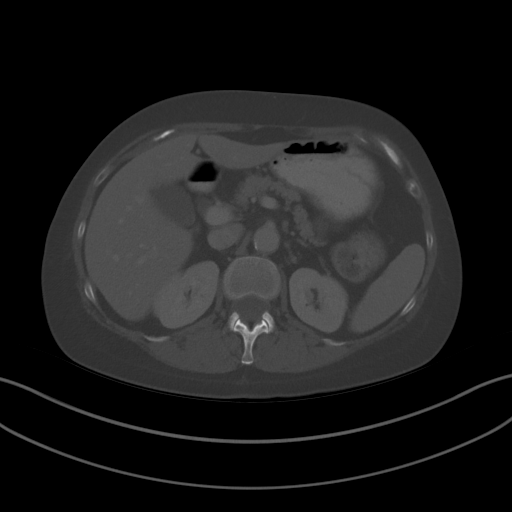
[im 135/135  bone]
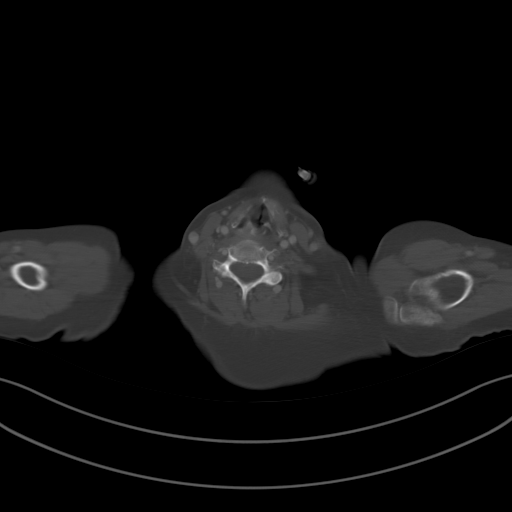

[Series 11: coronals · coronal · 0.83mm/px · 2 of 144 slices shown]
[im 48/144  bone]
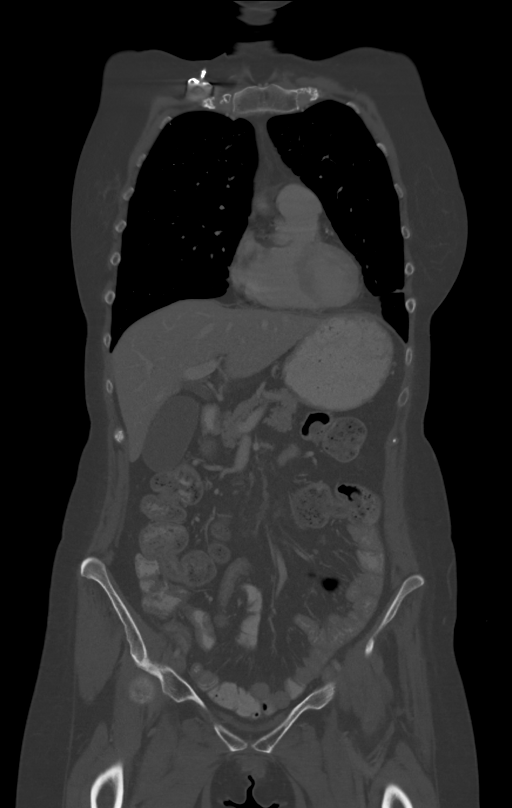
[im 96/144  bone]
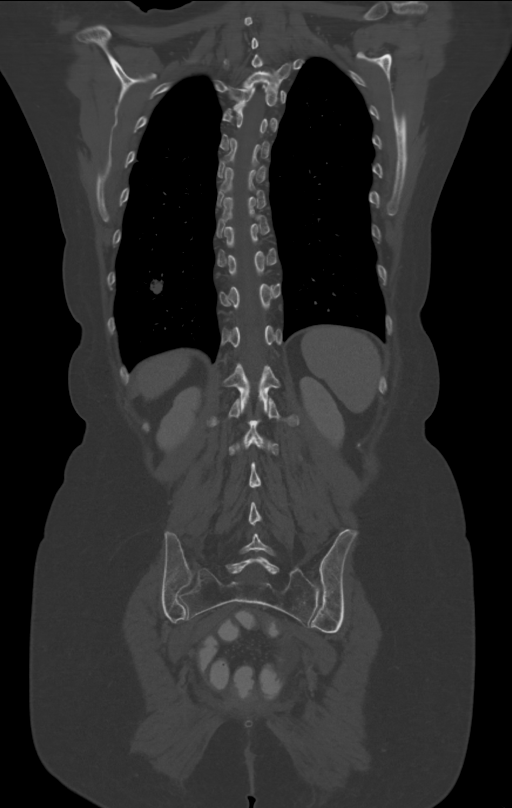

[Series 12: sagittals · sagittal · 0.64mm/px · 5 of 179 slices shown, 6 images]
[im 60/179  bone]
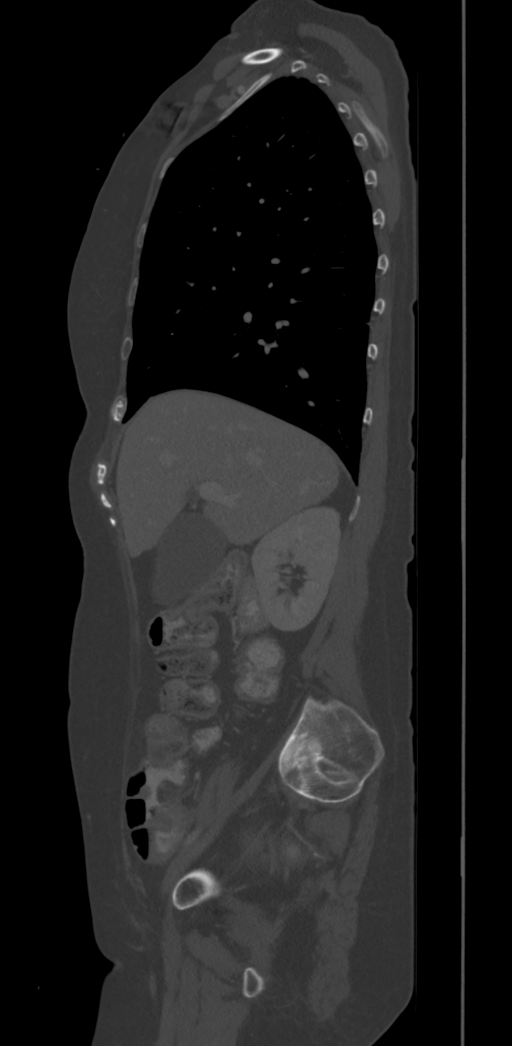
[im 75/179  bone]
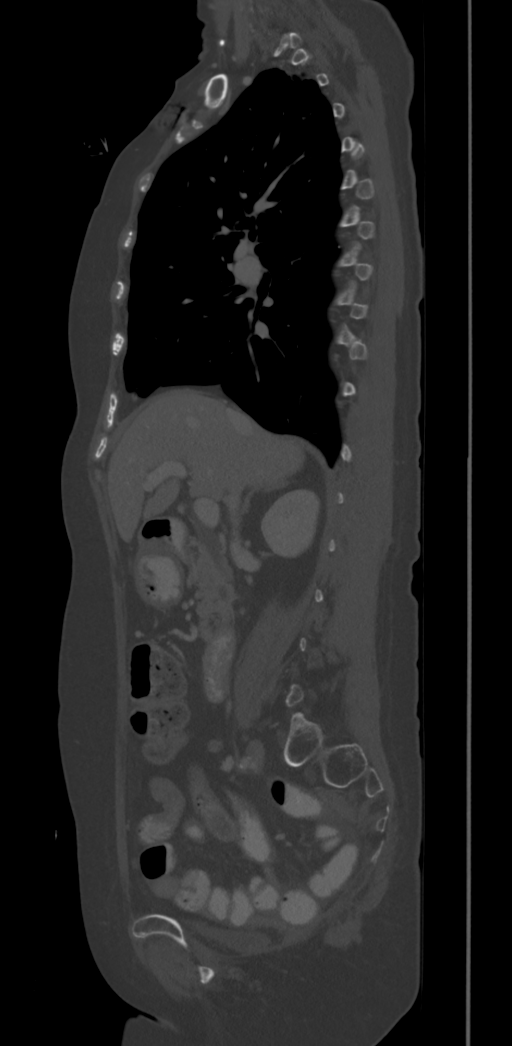
[im 90/179  soft-tissue]
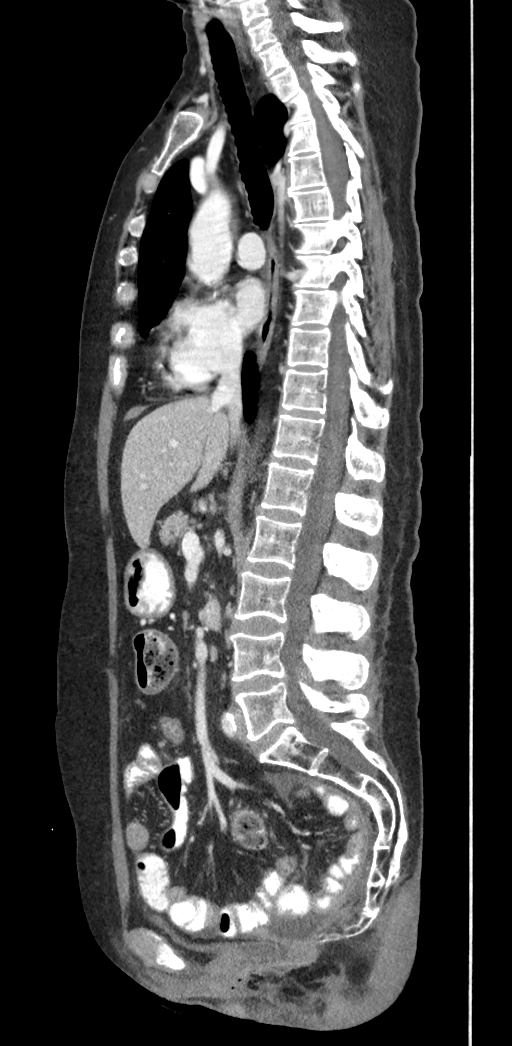
[im 90/179  bone]
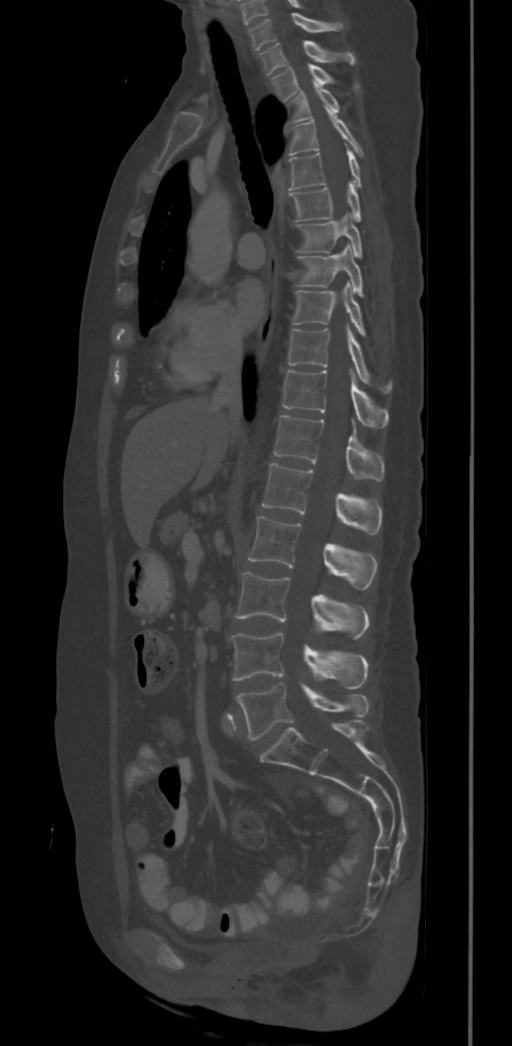
[im 104/179  bone]
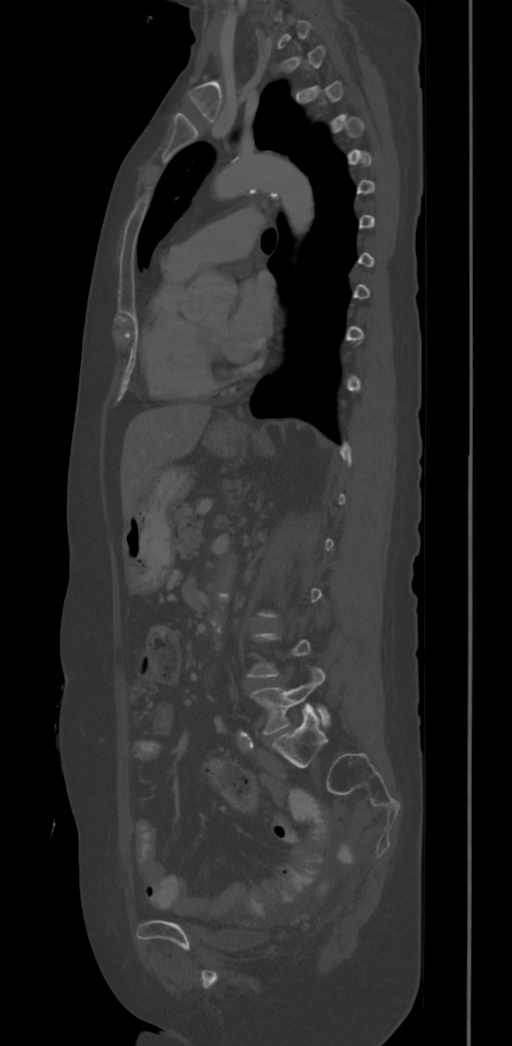
[im 119/179  bone]
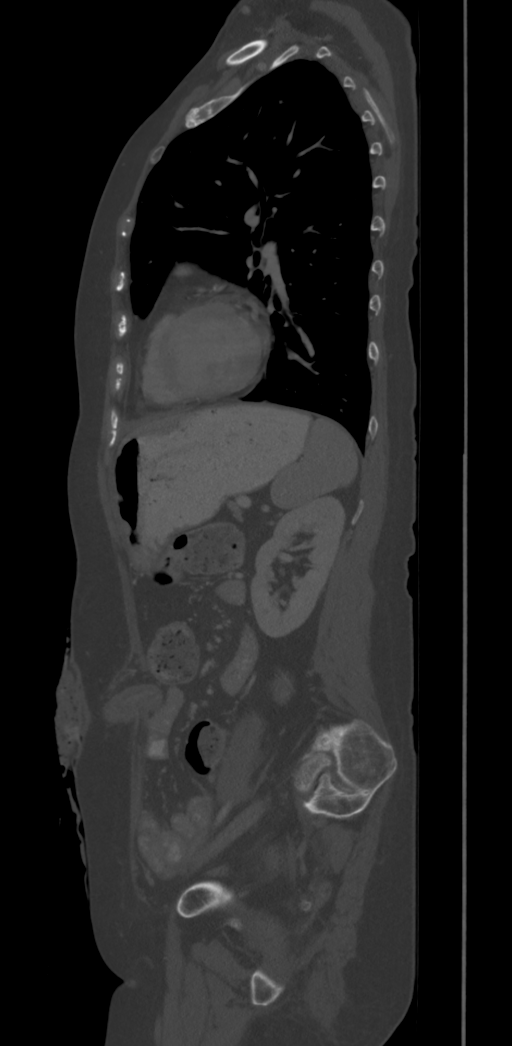

[10 of 33 positions shown; findings below may reference images not displayed]

FINDINGS: Pharynx and larynx: Larynx and pharynx soft tissue contours are
normal. Negative parapharyngeal and retropharyngeal spaces.

Salivary glands: Sublingual space, submandibular glands, and parotid
glands are normal.

Thyroid: Negative.

Lymph nodes: Negative.  No cervical lymphadenopathy.

Vascular: Right IJ approach chest porta cath. Major vascular
structures in the neck and at the skull base are patent. Mild
cervical carotid calcified atherosclerosis on the left. Prominent
posterior paravertebral veins (series 4, image 36) with associated
emissary veins at the skull base, a normal variant.

Limited intracranial: Negative.

Visualized orbits: Minimally included and negative.

Mastoids and visualized paranasal sinuses: Clear.

Skeleton: Mild for age cervical spine degeneration. Partially
visible thoracic scoliosis. Carious posterior dentition. Osteopenia
but no acute or suspicious osseous lesion identified.

Upper chest: Reported separately today.
IMPRESSION: No acute or metastatic process identified. No explanation for acute
neck pain.

## 2023-04-27 NOTE — Telephone Encounter (Signed)
TC

## 2023-06-01 NOTE — Telephone Encounter (Signed)
Telephone call
